# Patient Record
Sex: Male | Born: 1940
Health system: Southern US, Community
[De-identification: ages and names within clinical notes are randomized; demographics above are authoritative.]

## PROBLEM LIST (undated history)

## (undated) DIAGNOSIS — R339 Retention of urine, unspecified: Secondary | ICD-10-CM

## (undated) DIAGNOSIS — R197 Diarrhea, unspecified: Secondary | ICD-10-CM

## (undated) DIAGNOSIS — I872 Venous insufficiency (chronic) (peripheral): Secondary | ICD-10-CM

## (undated) DIAGNOSIS — I452 Bifascicular block: Secondary | ICD-10-CM

## (undated) DIAGNOSIS — R972 Elevated prostate specific antigen [PSA]: Secondary | ICD-10-CM

## (undated) DIAGNOSIS — K529 Noninfective gastroenteritis and colitis, unspecified: Secondary | ICD-10-CM

## (undated) DIAGNOSIS — Z8042 Family history of malignant neoplasm of prostate: Secondary | ICD-10-CM

## (undated) DIAGNOSIS — E340 Carcinoid syndrome: Secondary | ICD-10-CM

## (undated) DIAGNOSIS — Z87898 Personal history of other specified conditions: Secondary | ICD-10-CM

## (undated) DIAGNOSIS — H268 Other specified cataract: Secondary | ICD-10-CM

## (undated) DIAGNOSIS — Z960 Presence of urogenital implants: Secondary | ICD-10-CM

## (undated) DIAGNOSIS — Z809 Family history of malignant neoplasm, unspecified: Secondary | ICD-10-CM

## (undated) DIAGNOSIS — Z808 Family history of malignant neoplasm of other organs or systems: Secondary | ICD-10-CM

## (undated) DIAGNOSIS — N401 Enlarged prostate with lower urinary tract symptoms: Secondary | ICD-10-CM

## (undated) DIAGNOSIS — D509 Iron deficiency anemia, unspecified: Secondary | ICD-10-CM

## (undated) DIAGNOSIS — C7B8 Other secondary neuroendocrine tumors: Secondary | ICD-10-CM

## (undated) DIAGNOSIS — Z978 Presence of other specified devices: Secondary | ICD-10-CM

## (undated) DIAGNOSIS — N529 Male erectile dysfunction, unspecified: Secondary | ICD-10-CM

## (undated) DIAGNOSIS — I10 Essential (primary) hypertension: Secondary | ICD-10-CM

## (undated) DIAGNOSIS — N138 Other obstructive and reflux uropathy: Secondary | ICD-10-CM

## (undated) DIAGNOSIS — H409 Unspecified glaucoma: Secondary | ICD-10-CM

## (undated) DIAGNOSIS — Z86010 Personal history of colonic polyps: Secondary | ICD-10-CM

## (undated) HISTORY — DX: Family history of malignant neoplasm of prostate: Z80.42

## (undated) HISTORY — DX: Family history of malignant neoplasm of other organs or systems: Z80.8

## (undated) HISTORY — PX: QUADRICEPS TENDON REPAIR: SHX756

## (undated) HISTORY — PX: INGUINAL HERNIA REPAIR: SUR1180

## (undated) HISTORY — DX: Family history of malignant neoplasm, unspecified: Z80.9

## (undated) HISTORY — PX: TONSILLECTOMY: SUR1361

---

## 1997-12-27 ENCOUNTER — Ambulatory Visit (HOSPITAL_COMMUNITY): Admission: RE | Admit: 1997-12-27 | Discharge: 1997-12-27 | Payer: Self-pay | Admitting: General Surgery

## 1998-01-10 ENCOUNTER — Ambulatory Visit (HOSPITAL_COMMUNITY): Admission: RE | Admit: 1998-01-10 | Discharge: 1998-01-10 | Payer: Self-pay | Admitting: Family Medicine

## 1998-11-10 ENCOUNTER — Emergency Department (HOSPITAL_COMMUNITY): Admission: EM | Admit: 1998-11-10 | Discharge: 1998-11-10 | Payer: Self-pay | Admitting: *Deleted

## 1998-11-16 ENCOUNTER — Ambulatory Visit (HOSPITAL_COMMUNITY): Admission: RE | Admit: 1998-11-16 | Discharge: 1998-11-16 | Payer: Self-pay | Admitting: *Deleted

## 1999-08-30 ENCOUNTER — Other Ambulatory Visit: Admission: RE | Admit: 1999-08-30 | Discharge: 1999-08-30 | Payer: Self-pay | Admitting: Urology

## 2000-07-20 ENCOUNTER — Emergency Department (HOSPITAL_COMMUNITY): Admission: EM | Admit: 2000-07-20 | Discharge: 2000-07-20 | Payer: Self-pay | Admitting: Emergency Medicine

## 2000-08-12 ENCOUNTER — Encounter: Admission: RE | Admit: 2000-08-12 | Discharge: 2000-11-10 | Payer: Self-pay | Admitting: Family Medicine

## 2001-02-06 ENCOUNTER — Emergency Department (HOSPITAL_COMMUNITY): Admission: EM | Admit: 2001-02-06 | Discharge: 2001-02-06 | Payer: Self-pay | Admitting: *Deleted

## 2002-09-02 ENCOUNTER — Encounter: Admission: RE | Admit: 2002-09-02 | Discharge: 2002-09-02 | Payer: Self-pay | Admitting: Internal Medicine

## 2002-09-19 ENCOUNTER — Encounter: Admission: RE | Admit: 2002-09-19 | Discharge: 2002-09-19 | Payer: Self-pay | Admitting: Internal Medicine

## 2002-09-27 ENCOUNTER — Encounter: Admission: RE | Admit: 2002-09-27 | Discharge: 2002-09-27 | Payer: Self-pay | Admitting: Internal Medicine

## 2003-02-08 ENCOUNTER — Encounter: Admission: RE | Admit: 2003-02-08 | Discharge: 2003-02-08 | Payer: Self-pay | Admitting: Internal Medicine

## 2003-02-19 ENCOUNTER — Emergency Department (HOSPITAL_COMMUNITY): Admission: EM | Admit: 2003-02-19 | Discharge: 2003-02-19 | Payer: Self-pay | Admitting: Emergency Medicine

## 2003-02-19 ENCOUNTER — Encounter: Payer: Self-pay | Admitting: Emergency Medicine

## 2003-03-01 ENCOUNTER — Encounter: Admission: RE | Admit: 2003-03-01 | Discharge: 2003-03-01 | Payer: Self-pay | Admitting: Internal Medicine

## 2003-11-13 ENCOUNTER — Encounter: Admission: RE | Admit: 2003-11-13 | Discharge: 2003-11-13 | Payer: Self-pay | Admitting: Internal Medicine

## 2004-02-21 ENCOUNTER — Encounter: Admission: RE | Admit: 2004-02-21 | Discharge: 2004-02-21 | Payer: Self-pay | Admitting: Internal Medicine

## 2004-03-14 ENCOUNTER — Ambulatory Visit (HOSPITAL_COMMUNITY): Admission: RE | Admit: 2004-03-14 | Discharge: 2004-03-14 | Payer: Self-pay | Admitting: Gastroenterology

## 2004-03-14 ENCOUNTER — Encounter (INDEPENDENT_AMBULATORY_CARE_PROVIDER_SITE_OTHER): Payer: Self-pay | Admitting: Specialist

## 2004-05-12 ENCOUNTER — Ambulatory Visit (HOSPITAL_COMMUNITY): Admission: RE | Admit: 2004-05-12 | Discharge: 2004-05-12 | Payer: Self-pay | Admitting: Orthopedic Surgery

## 2004-09-04 ENCOUNTER — Ambulatory Visit: Payer: Self-pay | Admitting: Internal Medicine

## 2005-01-16 ENCOUNTER — Ambulatory Visit: Payer: Self-pay | Admitting: Internal Medicine

## 2005-11-13 ENCOUNTER — Ambulatory Visit: Payer: Self-pay | Admitting: Internal Medicine

## 2005-11-27 ENCOUNTER — Ambulatory Visit: Payer: Self-pay | Admitting: Internal Medicine

## 2006-11-23 ENCOUNTER — Telehealth: Payer: Self-pay | Admitting: *Deleted

## 2007-01-16 ENCOUNTER — Emergency Department (HOSPITAL_COMMUNITY): Admission: EM | Admit: 2007-01-16 | Discharge: 2007-01-16 | Payer: Self-pay | Admitting: Emergency Medicine

## 2007-01-20 ENCOUNTER — Encounter: Admission: RE | Admit: 2007-01-20 | Discharge: 2007-01-20 | Payer: Self-pay | Admitting: Family Medicine

## 2007-01-28 ENCOUNTER — Encounter: Admission: RE | Admit: 2007-01-28 | Discharge: 2007-01-28 | Payer: Self-pay | Admitting: Family Medicine

## 2007-05-01 ENCOUNTER — Emergency Department (HOSPITAL_COMMUNITY): Admission: EM | Admit: 2007-05-01 | Discharge: 2007-05-01 | Payer: Self-pay | Admitting: Emergency Medicine

## 2007-06-17 ENCOUNTER — Encounter: Admission: RE | Admit: 2007-06-17 | Discharge: 2007-06-17 | Payer: Self-pay | Admitting: Orthopedic Surgery

## 2010-02-14 ENCOUNTER — Emergency Department (HOSPITAL_COMMUNITY): Admission: EM | Admit: 2010-02-14 | Discharge: 2010-02-14 | Payer: Self-pay | Admitting: Family Medicine

## 2011-01-08 ENCOUNTER — Emergency Department (HOSPITAL_COMMUNITY): Payer: Medicare Other

## 2011-01-08 ENCOUNTER — Observation Stay (HOSPITAL_COMMUNITY)
Admission: EM | Admit: 2011-01-08 | Discharge: 2011-01-10 | Disposition: A | Discharge status: Home / Self Care | Payer: Medicare Other | Attending: Orthopedic Surgery | Admitting: Orthopedic Surgery

## 2011-01-08 DIAGNOSIS — N400 Enlarged prostate without lower urinary tract symptoms: Secondary | ICD-10-CM | POA: Insufficient documentation

## 2011-01-08 DIAGNOSIS — W010XXA Fall on same level from slipping, tripping and stumbling without subsequent striking against object, initial encounter: Secondary | ICD-10-CM | POA: Insufficient documentation

## 2011-01-08 DIAGNOSIS — Y9301 Activity, walking, marching and hiking: Secondary | ICD-10-CM | POA: Insufficient documentation

## 2011-01-08 DIAGNOSIS — S838X9A Sprain of other specified parts of unspecified knee, initial encounter: Principal | ICD-10-CM | POA: Insufficient documentation

## 2011-01-08 DIAGNOSIS — I10 Essential (primary) hypertension: Secondary | ICD-10-CM | POA: Insufficient documentation

## 2011-01-08 DIAGNOSIS — Z01812 Encounter for preprocedural laboratory examination: Secondary | ICD-10-CM | POA: Insufficient documentation

## 2011-01-08 DIAGNOSIS — Y998 Other external cause status: Secondary | ICD-10-CM | POA: Insufficient documentation

## 2011-01-09 LAB — BASIC METABOLIC PANEL
BUN: 10 mg/dL (ref 6–23)
CO2: 29 mEq/L (ref 19–32)
Chloride: 102 mEq/L (ref 96–112)
Creatinine, Ser: 1.02 mg/dL (ref 0.4–1.5)
Glucose, Bld: 161 mg/dL — ABNORMAL HIGH (ref 70–99)

## 2011-01-09 LAB — HEMOGLOBIN AND HEMATOCRIT, BLOOD: HCT: 36.6 % — ABNORMAL LOW (ref 39.0–52.0)

## 2011-01-11 LAB — POCT I-STAT 4, (NA,K, GLUC, HGB,HCT): Hemoglobin: 15.3 g/dL (ref 13.0–17.0)

## 2011-01-15 NOTE — Op Note (Signed)
NAME:  EVELIO, RUEDA NO.:  [id number]  MEDICAL RECORD NO.:  [id number]           PATIENT TYPE:  I  LOCATION:  5014                         FACILITY:  [facility]  PHYSICIAN:  Almedia Balls. Ranell Patrick, M.D. DATE OF BIRTH:  23-Jul-1941  DATE OF PROCEDURE:  01/08/2011 DATE OF DISCHARGE:                              OPERATIVE REPORT   PREOPERATIVE DIAGNOSIS:  Left quadriceps tendon tear.  POSTOPERATIVE DIAGNOSIS:  Left quadriceps tendon tear.  PROCEDURE PERFORMED:  Left quadriceps tendon repair.  SURGEON:  Almedia Balls. Ranell Patrick, MD  ASSISTANT:  Donnie Coffin. Dixon, PA  General anesthesia was used.  BLOOD LOSS:  Minimal.  FLUID REPLACEMENT:  Crystalloid 1200 mL.  INSTRUMENT COUNTS:  Correct.  There were no complications.  Tourniquet time was 45 minutes at 300 mmHg.  INDICATIONS:  The patient is a 70 year old male who suffered hyperflexion injuring to his left knee.  The patient was unable to ambulate after his injury.  The patient presented to [facility] Emergency Room where clinical diagnosis as well as MRI scan documenting complete quadriceps tendon tear on the left.  The patient has palpable defect, unable to extend his knee against gravity.  The patient presents now for operative treatment to restore continuity of his extensor mechanism to his left knee.  Informed consent was obtained.  DESCRIPTION OF PROCEDURE:  After an adequate level of anesthesia was achieved, the patient was positioned supine on the table.  Nonsterile tourniquet was placed over left proximal thigh.  Left leg sterilely prepped and draped in usual manner.  After exsanguination of the limb using Esmarch bandage, we elevated the tourniquet to 350 mmHg. Longitudinal midline skin incision was created using a 10-blade scalpel. Dissection down to the subcutaneous tissues using knife.  Bovie electrocautery used to control hemostasis.  I identified the quadriceps tendon which was completely torn.  We  debrided the tendon back to fresh ends, removing some devitalized tendinous tissue.  We also freshened up the proximal portion of the patella and used a rongeur to get down to bleeding bone.  We then placed three drill holes longitudinally through the bone and passing #2 FiberWire suture that we had whipstitched in a running Krackow fashion up into the quadriceps tendon for about 10 cm with locking sutures.  We also repaired the retinaculum medially and laterally using figure-of-eight #2 FiberWire suture x3 on both sides. Once we had our knee thoroughly irrigated.  We went ahead and tied our central sutures that were through the drill holes and tied them under the tendon distally so we would not incarcerate tendon.  We then tied our retinacular sutures.  We then oversewed the entire repair site with #1 Vicryl suture.  We had a nice solid repair that did not gap up to 40 degrees of flexion.  We thoroughly irrigated and closed with subcutaneous tissues of 2-0 Vicryl followed by staples for skin. Sterile compressive bandage with the knee immobilizer applied.  The patient tolerated the procedure well.     Almedia Balls. Ranell Patrick, M.D.     SRN/MEDQ  D:  01/09/2011  T:  01/09/2011  Job:  [id number]  Electronically  Signed by Malon Kindle  on 01/15/2011 05:19:49 PM

## 2011-01-31 NOTE — Discharge Summary (Signed)
  NAME:  Charles Schmidt, Charles Schmidt NO.:  [id number]  MEDICAL RECORD NO.:  [id number]           PATIENT TYPE:  O  LOCATION:  5014                         FACILITY:  [facility]  PHYSICIAN:  Almedia Balls. Ranell Patrick, M.D. DATE OF BIRTH:  08/26/1941  DATE OF ADMISSION:  01/08/2011 DATE OF DISCHARGE:  01/10/2011                              DISCHARGE SUMMARY   ADMITTING DIAGNOSIS:  Left complete quadriceps tendon tear, and that was performed on January 08, 2011.  CONSULTING SERVICES:  Physical Therapy, Occupational Therapy, Discharge Planning.  HISTORY OF PRESENT ILLNESS:  The patient is a 70 year old male who suffered a hyperflexion injury to his left knee.  The patient presented with an obvious quadriceps tendon tear verified by MRI scan.  The patient is admitted now for definitive operative treatment and recovery. Informed consent was obtained.  For further details on the patient's past medical history and physical examination, please see the hospital record.  HOSPITAL COURSE:  The patient was admitted to Orthopedics and taken to the operating room theater on January 08, 2011, where a left quadriceps tendon repair was performed.  The patient tolerated the procedure well, was taken postoperatively to the Orthopedic floor where he had physical therapy and occupational therapy.  The patient was mobilizing, weightbearing as tolerated in the knee immobilizer with crutches and walker prior to his discharge.  He was discharged in an improved condition to home with assistance of the house including physical therapy.  He will not be doing any range of motion work at home.  He is discharged on Robaxin and Percocet and a 325 of aspirin.  He is taking 1 tablet daily and using antiembolism stockings and mobilizing a lot to try to decrease his blood clot risk.  The patient will follow up in Orthopedics in week and a half to two weeks.     Almedia Balls. Ranell Patrick, M.D.     SRN/MEDQ  D:  01/15/2011   T:  01/16/2011  Job:  [id number]  Electronically Signed by Malon Kindle  on 01/31/2011 04:28:10 PM

## 2011-02-07 NOTE — Op Note (Signed)
NAME:  Charles Schmidt, Charles Schmidt                        ACCOUNT NO.:  [id number]   MEDICAL RECORD NO.:  [id number]                   PATIENT TYPE:  AMB   LOCATION:  ENDO                                 FACILITY:  [facility]   PHYSICIAN:  Graylin Shiver, M.D.                DATE OF BIRTH:  Mar 05, 1941   DATE OF PROCEDURE:  03/14/2004  DATE OF DISCHARGE:                                 OPERATIVE REPORT   PROCEDURE:  Colonoscopy with biopsy.   INDICATIONS FOR PROCEDURE:  History of colon polyps.  Informed consent was  obtained after explanation of the risks of bleeding, infection, and  perforation.   PREMEDICATION:  Fentanyl 60 mcg IV, Versed 7.5 mg IV.   PROCEDURE IN DETAIL:  With the patient in the left lateral decubitus  position, a rectal exam was performed and no masses were felt.  The Olympus  colonoscope was inserted into the rectum and advanced around the colon to  the cecum.  Cecal landmarks were identified.  The cecum was normal.  In the  ascending colon, there was a small 3 mm polyp biopsied off with cold  forceps.  In the transverse colon, there was a long fold which I suspect  could be a stalk from a prior polypectomy.  A biopsy of this was obtained.  It did not look like a polyp, but it could be a fold that is elongated or a  stalk from a prior polypectomy.  The descending colon, sigmoid, and rectum  looked normal.  He tolerated the procedure well without complications.   IMPRESSION:  Small polyps in the ascending colon and a probable retained  stalk from prior polypectomy.   PLAN:  The pathology will be checked.                                               Graylin Shiver, M.D.    Germain Osgood  D:  03/14/2004  T:  03/15/2004  Job:  [id number]   cc:   Blanch Media, M.D.  [facility]  [place], [place] [address]  Fax: [phone]

## 2012-05-16 ENCOUNTER — Encounter (HOSPITAL_COMMUNITY): Payer: Self-pay | Admitting: *Deleted

## 2012-05-16 ENCOUNTER — Emergency Department (HOSPITAL_COMMUNITY)
Admission: EM | Admit: 2012-05-16 | Discharge: 2012-05-16 | Disposition: A | Discharge status: Home / Self Care | Payer: Medicare Other | Attending: Emergency Medicine | Admitting: Emergency Medicine

## 2012-05-16 DIAGNOSIS — R21 Rash and other nonspecific skin eruption: Secondary | ICD-10-CM

## 2012-05-16 DIAGNOSIS — I10 Essential (primary) hypertension: Secondary | ICD-10-CM | POA: Insufficient documentation

## 2012-05-16 HISTORY — DX: Essential (primary) hypertension: I10

## 2012-05-16 MED ORDER — PERMETHRIN 5 % EX CREA
TOPICAL_CREAM | Freq: Once | CUTANEOUS | Status: AC
  Administered 2012-05-16: 10:00:00 via TOPICAL
  Filled 2012-05-16: qty 60

## 2012-05-16 NOTE — ED Notes (Signed)
Pt states a week ago he was out in the woods and got a rash on his inner thigh. Pt states that the rash is now on his right hand for the past week. Pt states tried calamine lotion with no relief.

## 2012-05-16 NOTE — ED Provider Notes (Signed)
History     CSN: [id number]  Arrival date & time 05/16/12  0700   First MD Initiated Contact with Patient 05/16/12 [phone]      Chief Complaint  Patient presents with  . Rash    (Consider location/radiation/quality/duration/timing/severity/associated sxs/prior treatment) Patient is a 71 y.o. male presenting with rash. The history is provided by the patient.  Rash  This is a new problem. The current episode started more than 2 days ago. The problem has been gradually worsening. Associated symptoms comments: Rash to right hand and distal forearm for one week. It starts as small fluid filled pustules that contain clear liquid then spread to a non-pustular rash. It itches, no pain. No swelling or fever. No one else at home affected. .    Past Medical History  Diagnosis Date  . Hypertension     History reviewed. No pertinent past surgical history.  History reviewed. No pertinent family history.  History  Substance Use Topics  . Smoking status: Never Smoker   . Smokeless tobacco: Not on file  . Alcohol Use: No      Review of Systems  Constitutional: Negative for fever.  Respiratory: Negative for cough.   Gastrointestinal: Negative for nausea.  Musculoskeletal: Negative for arthralgias.  Skin: Positive for rash.    Allergies  Review of patient's allergies indicates no known allergies.  Home Medications   Current Outpatient Rx  Name Route Sig Dispense Refill  . AMLODIPINE BESYLATE 5 MG PO TABS Oral Take 5 mg by mouth daily.    . DORZOLAMIDE HCL-TIMOLOL MAL 22.3-6.8 MG/ML OP SOLN Left Eye Place 1 drop into the left eye 2 (two) times daily.    Marland Kitchen DOXAZOSIN MESYLATE 4 MG PO TABS Oral Take 4 mg by mouth at bedtime.    Marland Kitchen FINASTERIDE 5 MG PO TABS Oral Take 5 mg by mouth daily.    Marland Kitchen HYDROCHLOROTHIAZIDE 25 MG PO TABS Oral Take 25 mg by mouth daily.    . NYQUIL PO Oral Take 1 capsule by mouth once. itching    . TRAVOPROST (BAK FREE) 0.004 % OP SOLN Left Eye Place 1 drop into the  left eye at bedtime.      BP 138/79  Pulse 76  Temp 97.6 F (36.4 C) (Oral)  Resp 16  SpO2 99%  Physical Exam  Constitutional: He is oriented to person, place, and time. He appears well-developed and well-nourished.  Neck: Normal range of motion.  Pulmonary/Chest: Effort normal.  Musculoskeletal: Normal range of motion.  Neurological: He is alert and oriented to person, place, and time.  Skin: Skin is warm and dry.       Maculopapular rash dorsum right hand and at base of fingers. One small blister with clear fluid, non-tender. Web spaces of finger intermittently affected. No volar rash.   Psychiatric: He has a normal mood and affect.    ED Course  Procedures (including critical care time)  Labs Reviewed - No data to display No results found.   No diagnosis found.  1. Rash   MDM  DDx: scabies vs. dyshydrotic eczema - will treat for both and recommend derm follow up.        Rodena Medin, PA-C 05/16/12 0900

## 2012-05-20 NOTE — ED Provider Notes (Signed)
Medical screening examination/treatment/procedure(s) were performed by non-physician practitioner and as supervising physician I was immediately available for consultation/collaboration.  Hurman Horn, MD 05/20/12 1150

## 2014-05-02 ENCOUNTER — Other Ambulatory Visit: Payer: Self-pay | Admitting: Gastroenterology

## 2014-08-22 ENCOUNTER — Other Ambulatory Visit: Payer: Self-pay | Admitting: Family Medicine

## 2014-08-22 ENCOUNTER — Ambulatory Visit
Admission: RE | Admit: 2014-08-22 | Discharge: 2014-08-22 | Disposition: A | Discharge status: Home / Self Care | Payer: Medicare Other | Source: Ambulatory Visit | Attending: Family Medicine | Admitting: Family Medicine

## 2014-08-22 DIAGNOSIS — J00 Acute nasopharyngitis [common cold]: Secondary | ICD-10-CM

## 2014-08-24 ENCOUNTER — Encounter (HOSPITAL_COMMUNITY): Payer: Self-pay | Admitting: Emergency Medicine

## 2014-08-24 ENCOUNTER — Emergency Department (HOSPITAL_COMMUNITY)
Admission: EM | Admit: 2014-08-24 | Discharge: 2014-08-24 | Disposition: A | Discharge status: Home / Self Care | Payer: Medicare Other | Attending: Emergency Medicine | Admitting: Emergency Medicine

## 2014-08-24 DIAGNOSIS — R339 Retention of urine, unspecified: Secondary | ICD-10-CM | POA: Insufficient documentation

## 2014-08-24 DIAGNOSIS — I10 Essential (primary) hypertension: Secondary | ICD-10-CM | POA: Insufficient documentation

## 2014-08-24 DIAGNOSIS — Z79899 Other long term (current) drug therapy: Secondary | ICD-10-CM | POA: Diagnosis not present

## 2014-08-24 LAB — COMPREHENSIVE METABOLIC PANEL
ALBUMIN: 3.3 g/dL — AB (ref 3.5–5.2)
ALT: 32 U/L (ref 0–53)
AST: 23 U/L (ref 0–37)
Alkaline Phosphatase: 105 U/L (ref 39–117)
Anion gap: 13 (ref 5–15)
BUN: 10 mg/dL (ref 6–23)
CALCIUM: 9.1 mg/dL (ref 8.4–10.5)
CO2: 26 mEq/L (ref 19–32)
Chloride: 98 mEq/L (ref 96–112)
Creatinine, Ser: 0.9 mg/dL (ref 0.50–1.35)
GFR calc non Af Amer: 82 mL/min — ABNORMAL LOW (ref >90)
GLUCOSE: 104 mg/dL — AB (ref 70–99)
Potassium: 3.8 mEq/L (ref 3.7–5.3)
SODIUM: 137 meq/L (ref 137–147)
TOTAL PROTEIN: 7.7 g/dL (ref 6.0–8.3)
Total Bilirubin: 0.2 mg/dL — ABNORMAL LOW (ref 0.3–1.2)

## 2014-08-24 LAB — CBC WITH DIFFERENTIAL/PLATELET
BASOS ABS: 0 10*3/uL (ref 0.0–0.1)
BASOS PCT: 0 % (ref 0–1)
EOS ABS: 0.1 10*3/uL (ref 0.0–0.7)
EOS PCT: 1 % (ref 0–5)
HCT: 34.9 % — ABNORMAL LOW (ref 39.0–52.0)
Hemoglobin: 11.6 g/dL — ABNORMAL LOW (ref 13.0–17.0)
LYMPHS ABS: 1.5 10*3/uL (ref 0.7–4.0)
Lymphocytes Relative: 23 % (ref 12–46)
MCH: 24 pg — AB (ref 26.0–34.0)
MCHC: 33.2 g/dL (ref 30.0–36.0)
MCV: 72.3 fL — AB (ref 78.0–100.0)
Monocytes Absolute: 0.4 10*3/uL (ref 0.1–1.0)
Monocytes Relative: 7 % (ref 3–12)
NEUTROS PCT: 68 % (ref 43–77)
Neutro Abs: 4.3 10*3/uL (ref 1.7–7.7)
PLATELETS: 191 10*3/uL (ref 150–400)
RBC: 4.83 MIL/uL (ref 4.22–5.81)
RDW: 15.4 % (ref 11.5–15.5)
WBC: 6.3 10*3/uL (ref 4.0–10.5)

## 2014-08-24 LAB — URINALYSIS, ROUTINE W REFLEX MICROSCOPIC
Bilirubin Urine: NEGATIVE
Glucose, UA: NEGATIVE mg/dL
HGB URINE DIPSTICK: NEGATIVE
Ketones, ur: NEGATIVE mg/dL
Leukocytes, UA: NEGATIVE
NITRITE: NEGATIVE
PH: 6.5 (ref 5.0–8.0)
Protein, ur: NEGATIVE mg/dL
SPECIFIC GRAVITY, URINE: 1.012 (ref 1.005–1.030)
UROBILINOGEN UA: 0.2 mg/dL (ref 0.0–1.0)

## 2014-08-24 NOTE — ED Notes (Signed)
Pt here with urinary retention starting today; pt sts was dribbling until noon and having some possible incontinence; pt denies pain but sts feels full

## 2014-08-24 NOTE — Discharge Instructions (Signed)
Acute Urinary Retention Acute urinary retention is when you are unable to pee (urinate). Acute urinary retention is common in older men. Prostates can get bigger, which blocks the flow of pee.  HOME CARE  Drink enough fluids to keep your pee clear or pale yellow.  If you are sent home with a tube that drains the bladder (catheter), there will be a drainage bag attached to it. There are two types of bags. One is big that you can wear at night without having to empty it. One is smaller and needs to be emptied more often.  Keep the drainage bag empty.  Keep the drainage bag lower than your catheter.  Only take medicine as told by your doctor. GET HELP IF: 1. You have a low-grade fever. 2. You have spasms or you are leaking pee when you have spasms. GET HELP RIGHT AWAY IF:  1. You have chills or a fever. 2. Your catheter stops draining pee. 3. Your catheter falls out. 4. You have increased bleeding that does not stop after you have rested and increased the amount of fluids you had been drinking. MAKE SURE YOU:  1. Understand these instructions. 2. Will watch your condition. 3. Will get help right away if you are not doing well or get worse. Document Released: 02/25/2008 Document Revised: 06/29/2013 Document Reviewed: 02/17/2013 [facility] Patient Information 2015 [place], [place]. This information is not intended to replace advice given to you by your health care provider. Make sure you discuss any questions you have with your health care provider. Foley Catheter Care A Foley catheter is a soft, flexible tube that is placed into the bladder to drain urine. A Foley catheter may be inserted if:  You leak urine or are not able to control when you urinate (urinary incontinence).  You are not able to urinate when you need to (urinary retention).  You had prostate surgery or surgery on the genitals.  You have certain medical conditions, such as multiple sclerosis, dementia, or a spinal cord  injury. If you are going home with a Foley catheter in place, follow the instructions below. TAKING CARE OF THE CATHETER 3. Wash your hands with soap and water. 4. Using mild soap and warm water on a clean washcloth:  Clean the area on your body closest to the catheter insertion site using a circular motion, moving away from the catheter. Never wipe toward the catheter because this could sweep bacteria up into the urethra and cause infection.  Remove all traces of soap. Pat the area dry with a clean towel. For males, reposition the foreskin. 5. Attach the catheter to your leg so there is no tension on the catheter. Use adhesive tape or a leg strap. If you are using adhesive tape, remove any sticky residue left behind by the previous tape you used. 6. Keep the drainage bag below the level of the bladder, but keep it off the floor. 7. Check throughout the day to be sure the catheter is working and urine is draining freely. Make sure the tubing does not become kinked. 8. Do not pull on the catheter or try to remove it. Pulling could damage internal tissues. TAKING CARE OF THE DRAINAGE BAGS You will be given two drainage bags to take home. One is a large overnight drainage bag, and the other is a smaller leg bag that fits underneath clothing. You may wear the overnight bag at any time, but you should never wear the smaller leg bag at night. Follow the  instructions below for how to empty, change, and clean your drainage bags. Emptying the Drainage Bag You must empty your drainage bag when it is  - full or at least 2-3 times a day. 5. Wash your hands with soap and water. 6. Keep the drainage bag below your hips, below the level of your bladder. This stops urine from going back into the tubing and into your bladder. 7. Hold the dirty bag over the toilet or a clean container. 8. Open the pour spout at the bottom of the bag and empty the urine into the toilet or container. Do not let the pour spout touch  the toilet, container, or any other surface. Doing so can place bacteria on the bag, which can cause an infection. 9. Clean the pour spout with a gauze pad or cotton ball that has rubbing alcohol on it. 10. Close the pour spout. 11. Attach the bag to your leg with adhesive tape or a leg strap. 12. Wash your hands well. Changing the Drainage Bag Change your drainage bag once a month or sooner if it starts to smell bad or look dirty. Below are steps to follow when changing the drainage bag. 4. Wash your hands with soap and water. 5. Pinch off the rubber catheter so that urine does not spill out. 6. Disconnect the catheter tube from the drainage tube at the connection valve. Do not let the tubes touch any surface. 7. Clean the end of the catheter tube with an alcohol wipe. Use a different alcohol wipe to clean the end of the drainage tube. 8. Connect the catheter tube to the drainage tube of the clean drainage bag. 9. Attach the new bag to the leg with adhesive tape or a leg strap. Avoid attaching the new bag too tightly. 10. Wash your hands well. Cleaning the Drainage Bag 1. Wash your hands with soap and water. 2. Wash the bag in warm, soapy water. 3. Rinse the bag thoroughly with warm water. 4. Fill the bag with a solution of white vinegar and water (1 cup vinegar to 1 qt warm water [.2 L vinegar to 1 L warm water]). Close the bag and soak it for 30 minutes in the solution. 5. Rinse the bag with warm water. 6. Hang the bag to dry with the pour spout open and hanging downward. 7. Store the clean bag (once it is dry) in a clean plastic bag. 8. Wash your hands well. PREVENTING INFECTION  Wash your hands before and after handling your catheter.  Take showers daily and wash the area where the catheter enters your body. Do not take baths. Replace wet leg straps with dry ones, if this applies.  Do not use powders, sprays, or lotions on the genital area. Only use creams, lotions, or ointments as  directed by your caregiver.  For females, wipe from front to back after each bowel movement.  Drink enough fluids to keep your urine clear or pale yellow unless you have a fluid restriction.  Do not let the drainage bag or tubing touch or lie on the floor.  Wear cotton underwear to absorb moisture and to keep your skin drier. SEEK MEDICAL CARE IF:   Your urine is cloudy or smells unusually bad.  Your catheter becomes clogged.  You are not draining urine into the bag or your bladder feels full.  Your catheter starts to leak. SEEK IMMEDIATE MEDICAL CARE IF:   You have pain, swelling, redness, or pus where the catheter enters the  body.  You have pain in the abdomen, legs, lower back, or bladder.  You have a fever.  You see blood fill the catheter, or your urine is pink or red.  You have nausea, vomiting, or chills.  Your catheter gets pulled out. MAKE SURE YOU:   Understand these instructions.  Will watch your condition.  Will get help right away if you are not doing well or get worse. Document Released: 09/08/2005 Document Revised: 01/23/2014 Document Reviewed: 08/30/2012 [facility] Patient Information 2015 [place], [place]. This information is not intended to replace advice given to you by your health care provider. Make sure you discuss any questions you have with your health care provider.

## 2014-08-24 NOTE — ED Notes (Signed)
Leg bag foley placed for discharge.

## 2014-08-24 NOTE — ED Provider Notes (Signed)
CSN: [id number]     Arrival date & time 08/24/14  1623 History   First MD Initiated Contact with Patient 08/24/14 1750     Chief Complaint  Patient presents with  . Urinary Retention     (Consider location/radiation/quality/duration/timing/severity/associated sxs/prior Treatment) Patient is a 73 y.o. male presenting with abdominal pain. The history is provided by the patient. No language interpreter was used.  Abdominal Pain Pain location:  Suprapubic Associated symptoms: no chills and no fever   Associated symptoms comment:  He reports progressive suprapubic pain and distention throughout the day. Last able to urinate at noon (7 hours ago). He denies history of urinary retention in the past but has a known history of prostatic hypertrophy, followed by urology. No fever, N, V or earlier hematuria.   Past Medical History  Diagnosis Date  . Hypertension    History reviewed. No pertinent past surgical history. History reviewed. No pertinent family history. History  Substance Use Topics  . Smoking status: Never Smoker   . Smokeless tobacco: Not on file  . Alcohol Use: No    Review of Systems  Constitutional: Negative for fever and chills.  HENT: Negative.   Respiratory: Negative.   Cardiovascular: Negative.   Gastrointestinal: Positive for abdominal pain.  Genitourinary: Positive for difficulty urinating. Negative for frequency, flank pain, scrotal swelling and testicular pain.  Musculoskeletal: Negative.   Skin: Negative.   Neurological: Negative.       Allergies  Review of patient's allergies indicates no known allergies.  Home Medications   Prior to Admission medications   Medication Sig Start Date End Date Taking? Authorizing Provider  amLODipine (NORVASC) 5 MG tablet Take 5 mg by mouth daily.   Yes Historical Provider, MD  dorzolamide-timolol (COSOPT) 22.3-6.8 MG/ML ophthalmic solution Place 1 drop into the left eye 2 (two) times daily.   Yes Historical Provider,  MD  doxazosin (CARDURA) 4 MG tablet Take 4 mg by mouth at bedtime.   Yes Historical Provider, MD  finasteride (PROSCAR) 5 MG tablet Take 5 mg by mouth daily.   Yes Historical Provider, MD  hydrochlorothiazide (HYDRODIURIL) 25 MG tablet Take 25 mg by mouth daily.   Yes Historical Provider, MD  levofloxacin (LEVAQUIN) 500 MG tablet Take 500 mg by mouth daily. 08/22/14  Yes Historical Provider, MD  potassium chloride SA (K-DUR,KLOR-CON) 20 MEQ tablet Take 20 mEq by mouth daily. 08/14/14  Yes Historical Provider, MD  Travoprost, BAK Free, (TRAVATAN) 0.004 % SOLN ophthalmic solution Place 1 drop into the left eye at bedtime.   Yes Historical Provider, MD   BP 130/69 mmHg  Pulse 72  Temp(Src) 97.4 F (36.3 C) (Oral)  Resp 24  SpO2 100% Physical Exam  Constitutional: He is oriented to person, place, and time. He appears well-developed and well-nourished. No distress.  Pulmonary/Chest: Effort normal.  Abdominal: Soft. He exhibits distension. He exhibits no mass. There is tenderness.  Neurological: He is alert and oriented to person, place, and time.  Skin: Skin is warm and dry.    ED Course  Procedures (including critical care time) Labs Review Labs Reviewed  CBC WITH DIFFERENTIAL - Abnormal; Notable for the following:    Hemoglobin 11.6 (*)    HCT 34.9 (*)    MCV 72.3 (*)    MCH 24.0 (*)    All other components within normal limits  COMPREHENSIVE METABOLIC PANEL - Abnormal; Notable for the following:    Glucose, Bld 104 (*)    Albumin 3.3 (*)  Total Bilirubin 0.2 (*)    GFR calc non Af Amer 82 (*)    All other components within normal limits  URINALYSIS, ROUTINE W REFLEX MICROSCOPIC - Abnormal; Notable for the following:    APPearance HAZY (*)    All other components within normal limits    Imaging Review No results found.   EKG Interpretation None      MDM   Final diagnoses:  None    1. Urinary retention  Foley catheter placed without difficulty. Patient is  feeling much better and is stable for discharge. No evidence infection, renal impairment. Suspect retention secondary to prostate enlargement. He has urology follow up.     Dewaine Oats, PA-C 08/24/14 [place], DO 08/24/14 [phone]

## 2014-10-22 ENCOUNTER — Encounter (HOSPITAL_COMMUNITY): Payer: Self-pay | Admitting: *Deleted

## 2014-10-22 ENCOUNTER — Emergency Department (HOSPITAL_COMMUNITY)
Admission: EM | Admit: 2014-10-22 | Discharge: 2014-10-22 | Disposition: A | Discharge status: Home / Self Care | Payer: Medicare Other | Attending: Emergency Medicine | Admitting: Emergency Medicine

## 2014-10-22 DIAGNOSIS — I10 Essential (primary) hypertension: Secondary | ICD-10-CM | POA: Diagnosis not present

## 2014-10-22 DIAGNOSIS — R339 Retention of urine, unspecified: Secondary | ICD-10-CM | POA: Diagnosis present

## 2014-10-22 DIAGNOSIS — Z79899 Other long term (current) drug therapy: Secondary | ICD-10-CM | POA: Insufficient documentation

## 2014-10-22 LAB — URINALYSIS, ROUTINE W REFLEX MICROSCOPIC
BILIRUBIN URINE: NEGATIVE
GLUCOSE, UA: NEGATIVE mg/dL
Hgb urine dipstick: NEGATIVE
KETONES UR: NEGATIVE mg/dL
LEUKOCYTES UA: NEGATIVE
Nitrite: NEGATIVE
PROTEIN: NEGATIVE mg/dL
Specific Gravity, Urine: 1.022 (ref 1.005–1.030)
UROBILINOGEN UA: 0.2 mg/dL (ref 0.0–1.0)
pH: 6.5 (ref 5.0–8.0)

## 2014-10-22 MED ORDER — TAMSULOSIN HCL 0.4 MG PO CAPS
0.4000 mg | ORAL_CAPSULE | ORAL | Status: AC
  Administered 2014-10-22: 0.4 mg via ORAL
  Filled 2014-10-22: qty 1

## 2014-10-22 MED ORDER — TAMSULOSIN HCL 0.4 MG PO CAPS: 0.4000 mg | ORAL_CAPSULE | Freq: Every day | ORAL | Status: DC

## 2014-10-22 NOTE — ED Notes (Signed)
In and out cath performed by ED Tech.  335cc's noted out.  Specimen obtained and sent to the lab.  Tolerated well.

## 2014-10-22 NOTE — ED Notes (Signed)
357cc's noted per bladder scanner.

## 2014-10-22 NOTE — ED Provider Notes (Signed)
CSN: [id number]     Arrival date & time 10/22/14  1911 History   First MD Initiated Contact with Patient 10/22/14 2018     Chief Complaint  Patient presents with  . Urinary Retention      The history is provided by the patient.  Patient presents with difficulty urinating.  This has been present all day.  It is unchanged.  He reports producing small amounts of urine.  However he feels he still has urine in his bladder.  Rest improves his symptoms.  He denies fever/vomiting.  No back pain.  No weakness is reported.  He has had this previously and has had a foley catheter placed.  He reports he is supposed to take rapaflo but would prefer to take flomax.    Past Medical History  Diagnosis Date  . Hypertension    History reviewed. No pertinent past surgical history. History reviewed. No pertinent family history. History  Substance Use Topics  . Smoking status: Never Smoker   . Smokeless tobacco: Not on file  . Alcohol Use: No    Review of Systems  Constitutional: Negative for fever.  Gastrointestinal: Negative for vomiting and abdominal pain.  Genitourinary: Positive for difficulty urinating.  Musculoskeletal: Negative for back pain.  Neurological: Negative for weakness.  All other systems reviewed and are negative.     Allergies  Review of patient's allergies indicates no known allergies.  Home Medications   Prior to Admission medications   Medication Sig Start Date End Date Taking? Authorizing Provider  amLODipine (NORVASC) 5 MG tablet Take 5 mg by mouth daily.    Historical Provider, MD  dorzolamide-timolol (COSOPT) 22.3-6.8 MG/ML ophthalmic solution Place 1 drop into the left eye 2 (two) times daily.    Historical Provider, MD  doxazosin (CARDURA) 4 MG tablet Take 4 mg by mouth at bedtime.    Historical Provider, MD  finasteride (PROSCAR) 5 MG tablet Take 5 mg by mouth daily.    Historical Provider, MD  hydrochlorothiazide (HYDRODIURIL) 25 MG tablet Take 25 mg by mouth  daily.    Historical Provider, MD  potassium chloride SA (K-DUR,KLOR-CON) 20 MEQ tablet Take 20 mEq by mouth daily. 08/14/14   Historical Provider, MD  Travoprost, BAK Free, (TRAVATAN) 0.004 % SOLN ophthalmic solution Place 1 drop into the left eye at bedtime.    Historical Provider, MD   BP 122/83 mmHg  Pulse 88  Temp(Src) 97.7 F (36.5 C) (Oral)  Resp 18  Ht 6\' 4"  (1.93 m)  Wt 252 lb (114.306 kg)  BMI 30.69 kg/m2  SpO2 99% Physical Exam CONSTITUTIONAL: Well developed/well nourished HEAD: Normocephalic/atraumatic EYES: EOMI/PERRL ENMT: Mucous membranes moist NECK: supple no meningeal signs SPINE/BACK:entire spine nontender CV: S1/S2 noted, no murmurs/rubs/gallops noted LUNGS: Lungs are clear to auscultation bilaterally, no apparent distress ABDOMEN: soft, nontender, no rebound or guarding, bowel sounds noted throughout abdomen GU:no cva tenderness NEURO: Pt is awake/alert/appropriate, moves all extremitiesx4.  No facial droop.   EXTREMITIES: pulses normal/equal, full ROM SKIN: warm, color normal PSYCH: no abnormalities of mood noted, alert and oriented to situation  ED Course  Procedures  9:21 PM Approximately 326ml urine in bladder.  Pt admits to passing some urine at home.  After further discussion he would prefer to have in/out catheter to remove urine and he would like dose of flomax.  Will not leave foley in place at this time.   9:53 PM Pt requests flomax at discharge He reports he is able to pass urine at home -  will defer foley catheter for now He is appropriate for d/c home  Labs Review Labs Reviewed  URINE CULTURE  URINALYSIS, ROUTINE W REFLEX MICROSCOPIC   Medications  tamsulosin (FLOMAX) capsule 0.4 mg (0.4 mg Oral Given 10/22/14 2104)      MDM   Final diagnoses:  Urinary retention    Nursing notes including past medical history and social history reviewed and considered in documentation Labs/vital reviewed myself and considered during  evaluation Previous records reviewed and considered     Sharyon Cable, MD 10/22/14 2153

## 2014-10-22 NOTE — Discharge Instructions (Signed)
PLEASE RETURN FOR ANY FEVER, VOMITNG, SEVERE ABDOMINAL PAIN OR IF YOU ARE UNABLE TO PASS ANY URINE

## 2014-10-22 NOTE — ED Notes (Signed)
Pt reports difficulty urinating. Pt called PCP, pt was told by PCP to come to ED to have catheter placed. Pt states he has the urge to urinate but only drops of urine come out. Pt reports urinating only a cup today.

## 2014-10-24 LAB — URINE CULTURE
Colony Count: NO GROWTH
Culture: NO GROWTH

## 2014-12-09 ENCOUNTER — Emergency Department (HOSPITAL_COMMUNITY)
Admission: EM | Admit: 2014-12-09 | Discharge: 2014-12-09 | Disposition: A | Discharge status: Home / Self Care | Payer: Medicare Other | Attending: Emergency Medicine | Admitting: Emergency Medicine

## 2014-12-09 ENCOUNTER — Encounter (HOSPITAL_COMMUNITY): Payer: Self-pay | Admitting: Emergency Medicine

## 2014-12-09 DIAGNOSIS — I8311 Varicose veins of right lower extremity with inflammation: Secondary | ICD-10-CM

## 2014-12-09 DIAGNOSIS — Z9981 Dependence on supplemental oxygen: Secondary | ICD-10-CM | POA: Diagnosis not present

## 2014-12-09 DIAGNOSIS — I872 Venous insufficiency (chronic) (peripheral): Secondary | ICD-10-CM | POA: Diagnosis not present

## 2014-12-09 DIAGNOSIS — Z79899 Other long term (current) drug therapy: Secondary | ICD-10-CM | POA: Diagnosis not present

## 2014-12-09 DIAGNOSIS — I10 Essential (primary) hypertension: Secondary | ICD-10-CM | POA: Diagnosis not present

## 2014-12-09 DIAGNOSIS — M7989 Other specified soft tissue disorders: Secondary | ICD-10-CM | POA: Diagnosis present

## 2014-12-09 NOTE — ED Notes (Signed)
Pt. Stated, I have this stuff on my leg  Were it itches ad then my socks will leave a ring around my leg.  I think my leg is going to rot

## 2014-12-09 NOTE — Discharge Instructions (Signed)

## 2014-12-09 NOTE — ED Notes (Signed)
EDP at the bedside.  ?

## 2014-12-09 NOTE — ED Provider Notes (Signed)
CSN: [id number]     Arrival date & time 12/09/14  1007 History   First MD Initiated Contact with Patient 12/09/14 1035     Chief Complaint  Patient presents with  . Leg Swelling  . Pruritis     (Consider location/radiation/quality/duration/timing/severity/associated sxs/prior Treatment) HPI Patient started to notice a discoloration of his leg about a year ago. He reports he noticed after wearing an ankle sock that he would have a ring around his leg. He felt that might be causing the problem. He then transitioned and wearing no socks. He is concerned about this brownish discoloration although it remains predominantly symptomatically. Past Medical History  Diagnosis Date  . Hypertension    History reviewed. No pertinent past surgical history. No family history on file. History  Substance Use Topics  . Smoking status: Never Smoker   . Smokeless tobacco: Not on file  . Alcohol Use: No    Review of Systems Constitutional: No fevers no chills Respiratory: No shortness of breath or chest pain.   Allergies  Review of patient's allergies indicates no known allergies.  Home Medications   Prior to Admission medications   Medication Sig Start Date End Date Taking? Authorizing Provider  amLODipine (NORVASC) 5 MG tablet Take 5 mg by mouth daily.    Historical Provider, MD  dorzolamide-timolol (COSOPT) 22.3-6.8 MG/ML ophthalmic solution Place 1 drop into the left eye 2 (two) times daily.    Historical Provider, MD  finasteride (PROSCAR) 5 MG tablet Take 5 mg by mouth daily.    Historical Provider, MD  hydrochlorothiazide (HYDRODIURIL) 25 MG tablet Take 25 mg by mouth daily.    Historical Provider, MD  potassium chloride SA (K-DUR,KLOR-CON) 20 MEQ tablet Take 20 mEq by mouth daily. 08/14/14   Historical Provider, MD  tamsulosin (FLOMAX) 0.4 MG CAPS capsule Take 1 capsule (0.4 mg total) by mouth daily after supper. 10/22/14   [name], MD  Travoprost, BAK Free, (TRAVATAN) 0.004 %  SOLN ophthalmic solution Place 1 drop into the left eye at bedtime.    Historical Provider, MD   BP 118/74 mmHg  Pulse 48  Temp(Src) 97.9 F (36.6 C) (Oral)  Resp 18  Ht 6' 4.5" (1.943 m)  Wt 250 lb (113.399 kg)  BMI 30.04 kg/m2  SpO2 97% Physical Exam  Constitutional: He is oriented to person, place, and time. He appears well-developed and well-nourished. No distress.  HENT:  Head: Normocephalic and atraumatic.  Eyes: EOM are normal.  Cardiovascular: Normal rate, regular rhythm, normal heart sounds and intact distal pulses.   Pulmonary/Chest: Effort normal and breath sounds normal. No respiratory distress. He has no wheezes. He has no rales.  Abdominal: Soft. He exhibits no distension. There is no tenderness.  Musculoskeletal:  The patient has a irregular patch of hyperpigmentation on the anterior tibial surface of the right lower extremity. This is approximately 12 x 6 cm. Overlying skin is normal, no erythema. Bilateral lower extremities have trace pitting edema nontender. There is skin thinning and hair loss.  Neurological: He is alert and oriented to person, place, and time. Coordination normal.  Skin: Skin is warm and dry.  Psychiatric: He has a normal mood and affect.    ED Course  Procedures (including critical care time) Labs Review Labs Reviewed - No data to display  Imaging Review No results found.   EKG Interpretation None      MDM   Final diagnoses:  Chronic venous stasis dermatitis, right   Findings are consistent with hyperpigmentation  seen and chronic venous stasis. Patient's historical description of swelling and seeing the sock line when he removes his oxygen evening is consistent with venous stasis. At this time there is no evidence of DVT or other acute complicating factors. The skin changes have been developing over a year that the patient has noticed. Counseled on the nature of venous stasis and to discuss ongoing management with his physician  including elevation and compression hose.    Charlesetta Shanks, MD 12/10/14 [phone]

## 2016-06-24 ENCOUNTER — Encounter (HOSPITAL_COMMUNITY): Payer: Self-pay | Admitting: Emergency Medicine

## 2016-06-24 DIAGNOSIS — R21 Rash and other nonspecific skin eruption: Secondary | ICD-10-CM | POA: Insufficient documentation

## 2016-06-24 DIAGNOSIS — I10 Essential (primary) hypertension: Secondary | ICD-10-CM | POA: Diagnosis not present

## 2016-06-24 NOTE — ED Triage Notes (Signed)
Pt states "i've got this thing on my leg and i've tried everything, i've had it for a year". Pt has what appears to be rash on his leg. Pt states 'I tried grease, keeping warm, exercising, leaving my socks off".

## 2016-06-25 ENCOUNTER — Emergency Department (HOSPITAL_COMMUNITY)
Admission: EM | Admit: 2016-06-25 | Discharge: 2016-06-25 | Disposition: A | Discharge status: Home / Self Care | Payer: Medicare Other | Attending: Emergency Medicine | Admitting: Emergency Medicine

## 2016-06-25 DIAGNOSIS — R21 Rash and other nonspecific skin eruption: Secondary | ICD-10-CM

## 2016-06-25 MED ORDER — HYDROCORTISONE 2.5 % EX LOTN: TOPICAL_LOTION | Freq: Two times a day (BID) | CUTANEOUS | 0 refills | Status: DC

## 2016-06-25 NOTE — Discharge Instructions (Signed)
MAKE AN APPOINTMENT AS SOON AS POSSIBLE WITH EITHER DERMATOLOGIST PROVIDED, OR WITH A DERMATOLOGIST OF YOUR CHOICE, FOR RECHECK AND FURTHER EVALUATION OF CHRONIC RASH.

## 2016-06-25 NOTE — ED Provider Notes (Signed)
[facility] DEPT Provider Note   CSN: [id number] Arrival date & time: 06/24/16  2216     History   Chief Complaint Chief Complaint  Patient presents with  . Rash    HPI Charles Schmidt is a 75 y.o. male.  Patient presents with complaint of rash to right greater than left LE's x 1 year. He reports trying topical emollients (Vaseline, grease), heat, reducing pressure (not wearing socks) to improve/resolve rash but, though heat makes it better, nothing resolves it. No LE swelling, redness, draining sores, fever.    The history is provided by the patient. No language interpreter was used.  Rash      Past Medical History:  Diagnosis Date  . Hypertension     There are no active problems to display for this patient.   History reviewed. No pertinent surgical history.     Home Medications    Prior to Admission medications   Medication Sig Start Date End Date Taking? Authorizing Provider  amLODipine (NORVASC) 5 MG tablet Take 5 mg by mouth daily.    Historical Provider, MD  dorzolamide-timolol (COSOPT) 22.3-6.8 MG/ML ophthalmic solution Place 1 drop into the left eye 2 (two) times daily.    Historical Provider, MD  finasteride (PROSCAR) 5 MG tablet Take 5 mg by mouth daily.    Historical Provider, MD  hydrochlorothiazide (HYDRODIURIL) 25 MG tablet Take 25 mg by mouth daily.    Historical Provider, MD  potassium chloride SA (K-DUR,KLOR-CON) 20 MEQ tablet Take 20 mEq by mouth daily. 08/14/14   Historical Provider, MD  tamsulosin (FLOMAX) 0.4 MG CAPS capsule Take 1 capsule (0.4 mg total) by mouth daily after supper. 10/22/14   [name], MD  Travoprost, BAK Free, (TRAVATAN) 0.004 % SOLN ophthalmic solution Place 1 drop into the left eye at bedtime.    Historical Provider, MD    Family History No family history on file.  Social History Social History  Substance Use Topics  . Smoking status: Never Smoker  . Smokeless tobacco: Not on file  . Alcohol use No      Allergies   Review of patient's allergies indicates no known allergies.   Review of Systems Review of Systems  Constitutional: Negative for fever.  Musculoskeletal: Negative for myalgias.  Skin: Positive for rash.  Neurological: Negative for weakness and numbness.     Physical Exam Updated Vital Signs BP 113/71   Pulse 81   Temp 98.2 F (36.8 C) (Oral)   Resp 18   Ht '6\' 4"'$  (1.93 m)   Wt 115.4 kg   SpO2 97%   BMI 30.98 kg/m   Physical Exam  Constitutional: He is oriented to person, place, and time. He appears well-developed and well-nourished.  Neck: Normal range of motion.  Pulmonary/Chest: Effort normal.  Musculoskeletal: Normal range of motion.  Neurological: He is alert and oriented to person, place, and time.  Skin: Skin is warm and dry.  Hyperpigmented rash to right greater than left distal LE's with thickening and scaling. Nontender. No erythema or swelling.   Psychiatric: He has a normal mood and affect.     ED Treatments / Results  Labs (all labs ordered are listed, but only abnormal results are displayed) Labs Reviewed - No data to display  EKG  EKG Interpretation None       Radiology No results found.  Procedures Procedures (including critical care time)  Medications Ordered in ED Medications - No data to display   Initial Impression / Assessment and Plan /  ED Course  I have reviewed the triage vital signs and the nursing notes.  Pertinent labs & imaging results that were available during my care of the patient were reviewed by me and considered in my medical decision making (see chart for details).  Clinical Course    Patient with chronic rash to LE's. Consider lichen planus. Does not appear c/w venous stasis. Will try topical cortisone and provide dermatology referral.   Final Clinical Impressions(s) / ED Diagnoses   Final diagnoses:  None   1. Chronic LE rash  New Prescriptions New Prescriptions   No medications on  file     Charlann Lange, PA-C 06/25/16 0201    Varney Biles, MD 06/25/16 2309

## 2016-10-02 DIAGNOSIS — Z0000 Encounter for general adult medical examination without abnormal findings: Secondary | ICD-10-CM | POA: Diagnosis not present

## 2016-10-02 DIAGNOSIS — I119 Hypertensive heart disease without heart failure: Secondary | ICD-10-CM | POA: Diagnosis not present

## 2016-10-02 DIAGNOSIS — D509 Iron deficiency anemia, unspecified: Secondary | ICD-10-CM | POA: Diagnosis not present

## 2016-10-02 DIAGNOSIS — E876 Hypokalemia: Secondary | ICD-10-CM | POA: Diagnosis not present

## 2016-10-02 DIAGNOSIS — R739 Hyperglycemia, unspecified: Secondary | ICD-10-CM | POA: Diagnosis not present

## 2016-10-02 DIAGNOSIS — N400 Enlarged prostate without lower urinary tract symptoms: Secondary | ICD-10-CM | POA: Diagnosis not present

## 2016-10-02 DIAGNOSIS — I10 Essential (primary) hypertension: Secondary | ICD-10-CM | POA: Diagnosis not present

## 2016-10-02 DIAGNOSIS — R7303 Prediabetes: Secondary | ICD-10-CM | POA: Diagnosis not present

## 2016-11-25 DIAGNOSIS — D509 Iron deficiency anemia, unspecified: Secondary | ICD-10-CM | POA: Diagnosis not present

## 2016-11-25 DIAGNOSIS — I10 Essential (primary) hypertension: Secondary | ICD-10-CM | POA: Diagnosis not present

## 2016-11-25 DIAGNOSIS — I119 Hypertensive heart disease without heart failure: Secondary | ICD-10-CM | POA: Diagnosis not present

## 2016-11-25 DIAGNOSIS — N400 Enlarged prostate without lower urinary tract symptoms: Secondary | ICD-10-CM | POA: Diagnosis not present

## 2016-11-25 DIAGNOSIS — R7303 Prediabetes: Secondary | ICD-10-CM | POA: Diagnosis not present

## 2016-11-25 DIAGNOSIS — E876 Hypokalemia: Secondary | ICD-10-CM | POA: Diagnosis not present

## 2017-03-10 DIAGNOSIS — I10 Essential (primary) hypertension: Secondary | ICD-10-CM | POA: Diagnosis not present

## 2017-03-10 DIAGNOSIS — E876 Hypokalemia: Secondary | ICD-10-CM | POA: Diagnosis not present

## 2017-03-10 DIAGNOSIS — R7303 Prediabetes: Secondary | ICD-10-CM | POA: Diagnosis not present

## 2017-03-10 DIAGNOSIS — N400 Enlarged prostate without lower urinary tract symptoms: Secondary | ICD-10-CM | POA: Diagnosis not present

## 2017-03-10 DIAGNOSIS — I119 Hypertensive heart disease without heart failure: Secondary | ICD-10-CM | POA: Diagnosis not present

## 2017-03-31 DIAGNOSIS — N400 Enlarged prostate without lower urinary tract symptoms: Secondary | ICD-10-CM | POA: Diagnosis not present

## 2017-03-31 DIAGNOSIS — L409 Psoriasis, unspecified: Secondary | ICD-10-CM | POA: Diagnosis not present

## 2017-03-31 DIAGNOSIS — L03115 Cellulitis of right lower limb: Secondary | ICD-10-CM | POA: Diagnosis not present

## 2017-03-31 DIAGNOSIS — R7303 Prediabetes: Secondary | ICD-10-CM | POA: Diagnosis not present

## 2017-03-31 DIAGNOSIS — I10 Essential (primary) hypertension: Secondary | ICD-10-CM | POA: Diagnosis not present

## 2017-03-31 DIAGNOSIS — I119 Hypertensive heart disease without heart failure: Secondary | ICD-10-CM | POA: Diagnosis not present

## 2017-04-13 DIAGNOSIS — H35371 Puckering of macula, right eye: Secondary | ICD-10-CM | POA: Diagnosis not present

## 2017-04-13 DIAGNOSIS — H2513 Age-related nuclear cataract, bilateral: Secondary | ICD-10-CM | POA: Diagnosis not present

## 2017-04-13 DIAGNOSIS — H401122 Primary open-angle glaucoma, left eye, moderate stage: Secondary | ICD-10-CM | POA: Diagnosis not present

## 2017-04-13 DIAGNOSIS — H401113 Primary open-angle glaucoma, right eye, severe stage: Secondary | ICD-10-CM | POA: Diagnosis not present

## 2017-04-17 DIAGNOSIS — I872 Venous insufficiency (chronic) (peripheral): Secondary | ICD-10-CM | POA: Diagnosis not present

## 2017-04-17 DIAGNOSIS — I8311 Varicose veins of right lower extremity with inflammation: Secondary | ICD-10-CM | POA: Diagnosis not present

## 2017-04-17 DIAGNOSIS — I8312 Varicose veins of left lower extremity with inflammation: Secondary | ICD-10-CM | POA: Diagnosis not present

## 2017-04-28 DIAGNOSIS — I119 Hypertensive heart disease without heart failure: Secondary | ICD-10-CM | POA: Diagnosis not present

## 2017-04-28 DIAGNOSIS — R7303 Prediabetes: Secondary | ICD-10-CM | POA: Diagnosis not present

## 2017-04-28 DIAGNOSIS — L03115 Cellulitis of right lower limb: Secondary | ICD-10-CM | POA: Diagnosis not present

## 2017-04-28 DIAGNOSIS — N400 Enlarged prostate without lower urinary tract symptoms: Secondary | ICD-10-CM | POA: Diagnosis not present

## 2017-04-28 DIAGNOSIS — L409 Psoriasis, unspecified: Secondary | ICD-10-CM | POA: Diagnosis not present

## 2017-04-28 DIAGNOSIS — Z113 Encounter for screening for infections with a predominantly sexual mode of transmission: Secondary | ICD-10-CM | POA: Diagnosis not present

## 2017-04-28 DIAGNOSIS — I10 Essential (primary) hypertension: Secondary | ICD-10-CM | POA: Diagnosis not present

## 2017-05-04 ENCOUNTER — Other Ambulatory Visit: Payer: Self-pay

## 2017-05-04 ENCOUNTER — Encounter: Payer: Self-pay | Admitting: Vascular Surgery

## 2017-05-04 DIAGNOSIS — E876 Hypokalemia: Secondary | ICD-10-CM

## 2017-05-04 DIAGNOSIS — D509 Iron deficiency anemia, unspecified: Secondary | ICD-10-CM | POA: Insufficient documentation

## 2017-05-04 DIAGNOSIS — I119 Hypertensive heart disease without heart failure: Secondary | ICD-10-CM | POA: Insufficient documentation

## 2017-05-04 DIAGNOSIS — L409 Psoriasis, unspecified: Secondary | ICD-10-CM | POA: Insufficient documentation

## 2017-05-04 DIAGNOSIS — I10 Essential (primary) hypertension: Secondary | ICD-10-CM | POA: Insufficient documentation

## 2017-05-04 DIAGNOSIS — R7303 Prediabetes: Secondary | ICD-10-CM | POA: Insufficient documentation

## 2017-05-04 DIAGNOSIS — N400 Enlarged prostate without lower urinary tract symptoms: Secondary | ICD-10-CM

## 2017-05-04 DIAGNOSIS — H409 Unspecified glaucoma: Secondary | ICD-10-CM | POA: Insufficient documentation

## 2017-05-04 DIAGNOSIS — I872 Venous insufficiency (chronic) (peripheral): Secondary | ICD-10-CM

## 2017-06-18 ENCOUNTER — Encounter: Payer: Self-pay | Admitting: Vascular Surgery

## 2017-06-18 ENCOUNTER — Ambulatory Visit (INDEPENDENT_AMBULATORY_CARE_PROVIDER_SITE_OTHER): Payer: Medicare Other | Admitting: Vascular Surgery

## 2017-06-18 ENCOUNTER — Ambulatory Visit (HOSPITAL_COMMUNITY)
Admission: RE | Admit: 2017-06-18 | Discharge: 2017-06-18 | Disposition: A | Discharge status: Home / Self Care | Payer: Medicare Other | Source: Ambulatory Visit | Attending: Vascular Surgery | Admitting: Vascular Surgery

## 2017-06-18 VITALS — BP 129/82 | HR 68 | Temp 97.1°F | Resp 18 | Ht 76.0 in | Wt 249.7 lb

## 2017-06-18 DIAGNOSIS — I872 Venous insufficiency (chronic) (peripheral): Secondary | ICD-10-CM | POA: Insufficient documentation

## 2017-06-18 NOTE — Progress Notes (Signed)
Referring Physician: Dr Vista Lawman  Patient name: Charles Schmidt MRN: [id number] DOB: 1941-07-31 Sex: male  REASON FOR CONSULT: Leg swelling  HPI: Charles Schmidt is a 76 y.o. male with a five-year history of intermittent leg swelling. The swelling occurs in both legs. He denies any prior history of DVT. He denies any significant trauma to his lower extremities in the past. He did have a quadriceps tendon repair in his left knee in the remote past. He states that recently he has had some itching of the skin in the lower legs but this is improved with compression stockings that he started wearing about a month ago. He denies history of diabetes or prior nonhealing wounds or ulcers in his legs. He does not smoke. Other medical problems include hypertension which is been stable.  Past Medical History:  Diagnosis Date  . Hypertension    Past Surgical History:  Procedure Laterality Date  . KNEE SURGERY Left    10-15 years ago per patient    Family History  Problem Relation Age of Onset  . Hypertension Mother   . Stroke Father     SOCIAL HISTORY: Social History   Social History  . Marital status: Legally Separated    Spouse name: N/A  . Number of children: N/A  . Years of education: N/A   Occupational History  . Not on file.   Social History Main Topics  . Smoking status: Never Smoker  . Smokeless tobacco: Never Used  . Alcohol use No  . Drug use: No  . Sexual activity: Not on file   Other Topics Concern  . Not on file   Social History Narrative  . No narrative on file    No Known Allergies  Current Outpatient Prescriptions  Medication Sig Dispense Refill  . amLODipine (NORVASC) 5 MG tablet Take 5 mg by mouth daily.    . dorzolamide-timolol (COSOPT) 22.3-6.8 MG/ML ophthalmic solution Place 1 drop into the left eye 2 (two) times daily.    . finasteride (PROSCAR) 5 MG tablet Take 5 mg by mouth daily.    . hydrochlorothiazide (HYDRODIURIL) 25 MG tablet Take 25 mg  by mouth daily.    . hydrocortisone 2.5 % lotion Apply topically 2 (two) times daily. 59 mL 0  . potassium chloride SA (K-DUR,KLOR-CON) 20 MEQ tablet Take 20 mEq by mouth daily.  4  . tamsulosin (FLOMAX) 0.4 MG CAPS capsule Take 1 capsule (0.4 mg total) by mouth daily after supper. 30 capsule 0  . Travoprost, BAK Free, (TRAVATAN) 0.004 % SOLN ophthalmic solution Place 1 drop into the left eye at bedtime.     No current facility-administered medications for this visit.     ROS:   General:  No weight loss, Fever, chills  HEENT: No recent headaches, no nasal bleeding, no visual changes, no sore throat  Neurologic: No dizziness, blackouts, seizures. No recent symptoms of stroke or mini- stroke. No recent episodes of slurred speech, or temporary blindness.  Cardiac: No recent episodes of chest pain/pressure, no shortness of breath at rest.  No shortness of breath with exertion.  Denies history of atrial fibrillation or irregular heartbeat  Vascular: No history of rest pain in feet.  No history of claudication.  No history of non-healing ulcer, No history of DVT   Pulmonary: No home oxygen, no productive cough, no hemoptysis,  No asthma or wheezing  Musculoskeletal:  [ ]  Arthritis, [ ]  Low back pain,  [ ]  Joint pain  Hematologic:No history  of hypercoagulable state.  No history of easy bleeding.  No history of anemia  Gastrointestinal: No hematochezia or melena,  No gastroesophageal reflux, no trouble swallowing  Urinary: [ ]  chronic Kidney disease, [ ]  on HD - [ ]  MWF or [ ]  TTHS, [ ]  Burning with urination, [ ]  Frequent urination, [ ]  Difficulty urinating;   Skin: No rashes  Psychological: No history of anxiety,  No history of depression   Physical Examination  Vitals:   06/18/17 1257  BP: 129/82  Pulse: 68  Resp: 18  Temp: (!) 97.1 F (36.2 C)  TempSrc: Oral  SpO2: 100%  Weight: 249 lb 11.2 oz (113.3 kg)  Height: 6\' 4"  (1.93 m)    Body mass index is 30.39  kg/m.  General:  Alert and oriented, no acute distress HEENT: Normal Neck: No bruit or JVD Pulmonary: Clear to auscultation bilaterally Cardiac: Regular Rate and Rhythm without murmur Abdomen: Soft, non-tender, non-distended, no mass, no scars Skin: Bilateral hemosiderin staining of the gaiter area of both legs. Right leg is worse than the left. 3 mm ulceration proximal aspect right medial calf granulating very thin friable skin and right medial calf Extremity Pulses:  2+ radial, brachial, femoral, dorsalis pedis, posterior tibial pulses bilaterally Musculoskeletal: No deformity trace pretibial edema bilaterally  Neurologic: Upper and lower extremity motor 5/5 and symmetric  DATA:  Patient had a venous duplex reflux exam today. This showed fairly diffuse deep vein reflux bilaterally. He also did have reflux in the lesser saphenous vein bilaterally. The greater saphenous vein was competent bilaterally. Lesser saphenous vein was 4-6 mm diameter.  ASSESSMENT:  I discussed with the patient today possibly placing him in an Unna boot for the right leg since he is starting to develop an ulcer on the right side. I also gave him the option of continuing his compression stockings. He has opted for the compression stockings currently. I discussed with him the pathophysiology of his deep vein reflux which will cause intermittent exacerbation and remission of swelling in this is best controlled with his compression stockings. He did have reflux in the superficial system primarily in the lesser saphenous vein. However, the vein diameter is fairly small and not sure he would benefit significantly from laser ablation of this. If the patient has continued problems in the future we could revisit this. He will call us if the compression stockings alone did not resolve most of his issues.   PLAN:  The patient will follow-up on as-needed basis if his symptoms become worse over time or are not improved with  compression therapy alone.   Ruta Hinds, MD [facility] Office: [phone] Pager: [phone]

## 2017-07-23 DIAGNOSIS — N401 Enlarged prostate with lower urinary tract symptoms: Secondary | ICD-10-CM | POA: Diagnosis not present

## 2017-07-28 DIAGNOSIS — L409 Psoriasis, unspecified: Secondary | ICD-10-CM | POA: Diagnosis not present

## 2017-07-28 DIAGNOSIS — I10 Essential (primary) hypertension: Secondary | ICD-10-CM | POA: Diagnosis not present

## 2017-07-28 DIAGNOSIS — Z01118 Encounter for examination of ears and hearing with other abnormal findings: Secondary | ICD-10-CM | POA: Diagnosis not present

## 2017-07-28 DIAGNOSIS — I119 Hypertensive heart disease without heart failure: Secondary | ICD-10-CM | POA: Diagnosis not present

## 2017-07-28 DIAGNOSIS — Z131 Encounter for screening for diabetes mellitus: Secondary | ICD-10-CM | POA: Diagnosis not present

## 2017-07-28 DIAGNOSIS — N400 Enlarged prostate without lower urinary tract symptoms: Secondary | ICD-10-CM | POA: Diagnosis not present

## 2017-07-28 DIAGNOSIS — R7303 Prediabetes: Secondary | ICD-10-CM | POA: Diagnosis not present

## 2017-07-28 DIAGNOSIS — H538 Other visual disturbances: Secondary | ICD-10-CM | POA: Diagnosis not present

## 2017-07-29 DIAGNOSIS — R972 Elevated prostate specific antigen [PSA]: Secondary | ICD-10-CM | POA: Diagnosis not present

## 2017-07-29 DIAGNOSIS — R351 Nocturia: Secondary | ICD-10-CM | POA: Diagnosis not present

## 2017-07-29 DIAGNOSIS — N401 Enlarged prostate with lower urinary tract symptoms: Secondary | ICD-10-CM | POA: Diagnosis not present

## 2017-09-29 DIAGNOSIS — I119 Hypertensive heart disease without heart failure: Secondary | ICD-10-CM | POA: Diagnosis not present

## 2017-09-29 DIAGNOSIS — N400 Enlarged prostate without lower urinary tract symptoms: Secondary | ICD-10-CM | POA: Diagnosis not present

## 2017-09-29 DIAGNOSIS — Z131 Encounter for screening for diabetes mellitus: Secondary | ICD-10-CM | POA: Diagnosis not present

## 2017-09-29 DIAGNOSIS — K529 Noninfective gastroenteritis and colitis, unspecified: Secondary | ICD-10-CM | POA: Diagnosis not present

## 2017-09-29 DIAGNOSIS — R7303 Prediabetes: Secondary | ICD-10-CM | POA: Diagnosis not present

## 2017-09-29 DIAGNOSIS — I10 Essential (primary) hypertension: Secondary | ICD-10-CM | POA: Diagnosis not present

## 2017-09-29 DIAGNOSIS — Z111 Encounter for screening for respiratory tuberculosis: Secondary | ICD-10-CM | POA: Diagnosis not present

## 2017-09-29 DIAGNOSIS — L409 Psoriasis, unspecified: Secondary | ICD-10-CM | POA: Diagnosis not present

## 2017-10-06 DIAGNOSIS — Z131 Encounter for screening for diabetes mellitus: Secondary | ICD-10-CM | POA: Diagnosis not present

## 2017-10-06 DIAGNOSIS — I119 Hypertensive heart disease without heart failure: Secondary | ICD-10-CM | POA: Diagnosis not present

## 2017-10-06 DIAGNOSIS — N400 Enlarged prostate without lower urinary tract symptoms: Secondary | ICD-10-CM | POA: Diagnosis not present

## 2017-10-06 DIAGNOSIS — L409 Psoriasis, unspecified: Secondary | ICD-10-CM | POA: Diagnosis not present

## 2017-10-06 DIAGNOSIS — R7303 Prediabetes: Secondary | ICD-10-CM | POA: Diagnosis not present

## 2017-10-06 DIAGNOSIS — I10 Essential (primary) hypertension: Secondary | ICD-10-CM | POA: Diagnosis not present

## 2017-10-06 DIAGNOSIS — K529 Noninfective gastroenteritis and colitis, unspecified: Secondary | ICD-10-CM | POA: Diagnosis not present

## 2017-10-07 ENCOUNTER — Other Ambulatory Visit: Payer: Self-pay | Admitting: Physician Assistant

## 2017-10-07 DIAGNOSIS — Z86010 Personal history of colonic polyps: Secondary | ICD-10-CM | POA: Diagnosis not present

## 2017-10-07 DIAGNOSIS — R634 Abnormal weight loss: Secondary | ICD-10-CM

## 2017-10-07 DIAGNOSIS — R1906 Epigastric swelling, mass or lump: Secondary | ICD-10-CM

## 2017-10-07 DIAGNOSIS — R5383 Other fatigue: Secondary | ICD-10-CM | POA: Diagnosis not present

## 2017-10-07 DIAGNOSIS — R197 Diarrhea, unspecified: Secondary | ICD-10-CM | POA: Diagnosis not present

## 2017-10-07 DIAGNOSIS — R10826 Epigastric rebound abdominal tenderness: Secondary | ICD-10-CM | POA: Diagnosis not present

## 2017-10-13 ENCOUNTER — Ambulatory Visit
Admission: RE | Admit: 2017-10-13 | Discharge: 2017-10-13 | Disposition: A | Discharge status: Home / Self Care | Payer: Medicare Other | Source: Ambulatory Visit | Attending: Physician Assistant | Admitting: Physician Assistant

## 2017-10-13 DIAGNOSIS — D490 Neoplasm of unspecified behavior of digestive system: Secondary | ICD-10-CM | POA: Diagnosis not present

## 2017-10-13 DIAGNOSIS — R634 Abnormal weight loss: Secondary | ICD-10-CM

## 2017-10-13 DIAGNOSIS — R1906 Epigastric swelling, mass or lump: Secondary | ICD-10-CM

## 2017-10-13 MED ORDER — IOPAMIDOL (ISOVUE-300) INJECTION 61%
125.0000 mL | Freq: Once | INTRAVENOUS | Status: AC | PRN
  Administered 2017-10-13: 125 mL via INTRAVENOUS

## 2017-10-14 ENCOUNTER — Encounter (HOSPITAL_COMMUNITY): Payer: Self-pay | Admitting: Emergency Medicine

## 2017-10-14 ENCOUNTER — Emergency Department (HOSPITAL_COMMUNITY)
Admission: EM | Admit: 2017-10-14 | Discharge: 2017-10-14 | Disposition: A | Discharge status: Home / Self Care | Payer: Medicare Other | Attending: Emergency Medicine | Admitting: Emergency Medicine

## 2017-10-14 DIAGNOSIS — Z79899 Other long term (current) drug therapy: Secondary | ICD-10-CM | POA: Diagnosis not present

## 2017-10-14 DIAGNOSIS — I119 Hypertensive heart disease without heart failure: Secondary | ICD-10-CM | POA: Insufficient documentation

## 2017-10-14 DIAGNOSIS — R339 Retention of urine, unspecified: Secondary | ICD-10-CM | POA: Diagnosis not present

## 2017-10-14 DIAGNOSIS — R109 Unspecified abdominal pain: Secondary | ICD-10-CM | POA: Diagnosis not present

## 2017-10-14 DIAGNOSIS — R300 Dysuria: Secondary | ICD-10-CM | POA: Diagnosis not present

## 2017-10-14 DIAGNOSIS — R338 Other retention of urine: Secondary | ICD-10-CM

## 2017-10-14 LAB — CBC WITH DIFFERENTIAL/PLATELET
BASOS PCT: 0 %
Basophils Absolute: 0 10*3/uL (ref 0.0–0.1)
Eosinophils Absolute: 0.1 10*3/uL (ref 0.0–0.7)
Eosinophils Relative: 3 %
HEMATOCRIT: 36.9 % — AB (ref 39.0–52.0)
HEMOGLOBIN: 12.6 g/dL — AB (ref 13.0–17.0)
LYMPHS PCT: 28 %
Lymphs Abs: 1.4 10*3/uL (ref 0.7–4.0)
MCH: 24.9 pg — ABNORMAL LOW (ref 26.0–34.0)
MCHC: 34.1 g/dL (ref 30.0–36.0)
MCV: 72.8 fL — AB (ref 78.0–100.0)
MONO ABS: 0.2 10*3/uL (ref 0.1–1.0)
MONOS PCT: 5 %
NEUTROS ABS: 3.2 10*3/uL (ref 1.7–7.7)
NEUTROS PCT: 64 %
Platelets: 358 10*3/uL (ref 150–400)
RBC: 5.07 MIL/uL (ref 4.22–5.81)
RDW: 16.3 % — AB (ref 11.5–15.5)
WBC: 5 10*3/uL (ref 4.0–10.5)

## 2017-10-14 LAB — URINALYSIS, ROUTINE W REFLEX MICROSCOPIC
Bilirubin Urine: NEGATIVE
Glucose, UA: NEGATIVE mg/dL
Hgb urine dipstick: NEGATIVE
Ketones, ur: NEGATIVE mg/dL
LEUKOCYTES UA: NEGATIVE
Nitrite: NEGATIVE
PH: 7 (ref 5.0–8.0)
Protein, ur: NEGATIVE mg/dL
SPECIFIC GRAVITY, URINE: 1.02 (ref 1.005–1.030)

## 2017-10-14 LAB — BASIC METABOLIC PANEL
ANION GAP: 13 (ref 5–15)
BUN: 7 mg/dL (ref 6–20)
CHLORIDE: 100 mmol/L — AB (ref 101–111)
CO2: 26 mmol/L (ref 22–32)
Calcium: 9.3 mg/dL (ref 8.9–10.3)
Creatinine, Ser: 0.9 mg/dL (ref 0.61–1.24)
GFR calc non Af Amer: >60 mL/min (ref >60)
GLUCOSE: 120 mg/dL — AB (ref 65–99)
Potassium: 3.6 mmol/L (ref 3.5–5.1)
Sodium: 139 mmol/L (ref 135–145)

## 2017-10-14 NOTE — Discharge Instructions (Signed)
Empty the leg bag when he gets full.  Call urology tomorrow to arrange follow-up.  Keep the leg bag in place until seen by urology.

## 2017-10-14 NOTE — ED Triage Notes (Signed)
Pt to ER due to urinary retention since 2 am this morning; states hx of BPH. States had CT w/ contrast yesterday due to having "diarrhea" per patient. States has hx of urinary retention requiring catheterization. NAD

## 2017-10-14 NOTE — ED Provider Notes (Signed)
[facility] EMERGENCY DEPARTMENT Provider Note   CSN: [id number] Arrival date & time: 10/14/17  0946     History   Chief Complaint Chief Complaint  Patient presents with  . Urinary Retention    HPI Rafe Mackowski is a 77 y.o. male.  Patient with a history of urinary retention in the past.  But not recently.  Followed by [facility].  Patient not been able to void since midnight.  His bladder feels very full.  No other specific complaints.      Past Medical History:  Diagnosis Date  . Hypertension     Patient Active Problem List   Diagnosis Date Noted  . Essential hypertension (primary) 05/04/2017  . Hypokalemia 05/04/2017  . Benign prostatic hyperplasia 05/04/2017  . Glaucoma 05/04/2017  . Benign hypertensive heart disease without heart failure 05/04/2017  . Microcytic anemia 05/04/2017  . Prediabetes 05/04/2017  . Hypocalcemia 05/04/2017  . Psoriasis 05/04/2017    Past Surgical History:  Procedure Laterality Date  . KNEE SURGERY Left    10-15 years ago per patient       Home Medications    Prior to Admission medications   Medication Sig Start Date End Date Taking? Authorizing Provider  amLODipine (NORVASC) 5 MG tablet Take 5 mg by mouth daily.   Yes [provider]  dorzolamide-timolol (COSOPT) 22.3-6.8 MG/ML ophthalmic solution Place 1 drop into the left eye 2 (two) times daily.   Yes [provider]  finasteride (PROSCAR) 5 MG tablet Take 5 mg by mouth daily.   Yes [provider]  hydrochlorothiazide (HYDRODIURIL) 25 MG tablet Take 25 mg by mouth daily.   Yes [provider]  hydrocortisone 2.5 % lotion Apply topically 2 (two) times daily. 06/25/16  Yes Upstill, Shari, PA-C  potassium chloride SA (K-DUR,KLOR-CON) 20 MEQ tablet Take 20 mEq by mouth daily. 08/14/14  Yes [provider]  tamsulosin (FLOMAX) 0.4 MG CAPS capsule Take 1 capsule (0.4 mg total) by mouth daily after supper.  10/22/14  Yes [name], MD  Travoprost, BAK Free, (TRAVATAN) 0.004 % SOLN ophthalmic solution Place 1 drop into the left eye at bedtime.   Yes [provider]    Family History Family History  Problem Relation Age of Onset  . Hypertension Mother   . Stroke Father     Social History Social History   Tobacco Use  . Smoking status: Never Smoker  . Smokeless tobacco: Never Used  Substance Use Topics  . Alcohol use: No  . Drug use: No     Allergies   Patient has no known allergies.   Review of Systems Review of Systems  Constitutional: Negative for fever.  HENT: Negative for congestion.   Eyes: Negative for redness.  Respiratory: Negative for shortness of breath.   Cardiovascular: Negative for chest pain.  Gastrointestinal: Positive for abdominal pain.  Genitourinary: Positive for difficulty urinating. Negative for hematuria.  Musculoskeletal: Negative for back pain.  Neurological: Negative for headaches.  Hematological: Does not bruise/bleed easily.  Psychiatric/Behavioral: Negative for confusion.     Physical Exam Updated Vital Signs BP 140/89   Pulse 73   Temp (!) 97.4 F (36.3 C) (Oral)   Resp 16   SpO2 97%   Physical Exam  Constitutional: He is oriented to person, place, and time. He appears well-developed and well-nourished. No distress.  HENT:  Head: Normocephalic and atraumatic.  Mouth/Throat: Oropharynx is clear and moist.  Eyes: Conjunctivae and EOM are normal. Pupils are  equal, round, and reactive to light.  Neck: Neck supple.  Cardiovascular: Normal rate, regular rhythm and normal heart sounds.  Pulmonary/Chest: Effort normal.  Abdominal: Soft. Bowel sounds are normal. He exhibits distension.  Bladder feels distended.  Genitourinary: Penis normal.  Musculoskeletal: Normal range of motion. He exhibits no edema.  Neurological: He is alert and oriented to person, place, and time. No cranial nerve deficit or sensory deficit. He  exhibits normal muscle tone. Coordination normal.  Skin: Skin is warm.  Nursing note and vitals reviewed.    ED Treatments / Results  Labs (all labs ordered are listed, but only abnormal results are displayed) Labs Reviewed  CBC WITH DIFFERENTIAL/PLATELET - Abnormal; Notable for the following components:      Result Value   Hemoglobin 12.6 (*)    HCT 36.9 (*)    MCV 72.8 (*)    MCH 24.9 (*)    RDW 16.3 (*)    All other components within normal limits  BASIC METABOLIC PANEL - Abnormal; Notable for the following components:   Chloride 100 (*)    Glucose, Bld 120 (*)    All other components within normal limits  URINALYSIS, ROUTINE W REFLEX MICROSCOPIC    EKG  EKG Interpretation None       Radiology Ct Abdomen Pelvis W Contrast  Result Date: 10/14/2017 CLINICAL DATA:  Loss of weight and diarrhea. EXAM: CT ABDOMEN AND PELVIS WITH CONTRAST TECHNIQUE: Multidetector CT imaging of the abdomen and pelvis was performed using the standard protocol following bolus administration of intravenous contrast. CONTRAST:  149mL ISOVUE-300 IOPAMIDOL (ISOVUE-300) INJECTION 61% Creatinine was obtained on site at [facility] at [address] Results: Creatinine 0.9 mg/dL. COMPARISON:  None. FINDINGS: Lower chest: There is a subpleural nodule overlying the posterolateral left lower lobe measuring 11 x 5 mm, image number 6 of series 2. No pleural effusions. Hepatobiliary: There hypertrophy of the caudate lobe of liver. The liver has a diffusely nodular contour. Innumerable ill defined low-attenuation lesions are identified throughout both lobes. Within the anterior right lobe of liver lesion measures 5 cm, image number 10 of series 2. Posterior right lobe of liver lesion measures 4.9 cm, image 13 of series 2. Mass in left lobe of liver measures 4.4 cm, image 18 of series 2. The gallbladder is normal. No biliary dilatation. Pancreas: Unremarkable. No pancreatic ductal dilatation or surrounding  inflammatory changes. Spleen: Normal in size without focal abnormality. Adrenals/Urinary Tract: The adrenal glands appear normal. Normal appearance of the right kidney. Left kidney cyst measures 5.1 cm, image 35 of series 2. No solid mass or hydronephrosis identified. Moderate distension of the urinary bladder. Diverticula arising from the left dome of liver measures 6.4 cm, image 68 of series 2. Stomach/Bowel: The stomach is normal. There is no pathologic dilatation of the small bowel loops. The appendix is visualized and appears normal. No pathologic dilatation of the colon. Normal appearance of the colon. Vascular/Lymphatic: Normal appearance of the abdominal aorta. Pre aortic lymph node measures 1.4 cm, image 41 of series 2. Within the central mesentery there is a soft tissue mass measuring 3.4 cm, image 52 of series 2. No pelvic or inguinal adenopathy identified. Reproductive: Prostate gland measures 4.9 by 4.8 by 7.1 cm (volume = 87 cm^3). Other: No peritoneal nodularity identified. No significant ascites or focal fluid collections identified. Musculoskeletal: Tiny lucent lesion within the left iliac bone is identified, image number 72 of series 2. Along the left SI joint there is a small posterior  iliac lucent lesion, image 74 of series 2. Well-circumscribed peripherally sclerotic lesion in the left iliac bone has a nonaggressive appearance measuring 1.6 cm. Within the right iliac bone there is a small lucent lesion measuring 5 mm, image 70 of series 2. IMPRESSION: 1. Extensive tumor is identified throughout both lobes of liver favored to represent metastatic disease. The liver has a cirrhotic appearance with a diffusely nodular contour and hypertrophy of the caudate lobe. Findings may represent primary cirrhosis or reflect secondary changes due to extensive metastatic disease. In the setting of primary cirrhosis multicentric hepatocellular carcinoma is also a potential differential consideration. Further  evaluation with tissue sampling is advised. 2. Soft tissue mass within the small bowel mesentery is identified. In the setting of diffuse metastatic disease findings may represent metastatic adenopathy versus primary carcinoid tumor. 3. Tiny lucent lesions identified within the bony pelvis which are suspicious for metastatic disease. 4. Small subpleural nodule overlying the posterolateral left lower lobe. Cannot rule out metastasis. 5. Prostate gland enlargement. Diverticula arising from the dome of bladder may reflect sequelae of chronic bladder outlet obstruction. 6. These results will be called to the ordering clinician or representative by the Radiologist Assistant, and communication documented in the PACS or zVision Dashboard. Electronically Signed   By: Kerby Moors M.D.   On: 10/14/2017 08:28    Procedures Procedures (including critical care time)  Medications Ordered in ED Medications - No data to display   Initial Impression / Assessment and Plan / ED Course  I have reviewed the triage vital signs and the nursing notes.  Pertinent labs & imaging results that were available during my care of the patient were reviewed by me and considered in my medical decision making (see chart for details).    Bladder scan was done which showed only 400 cc but based on exam and patient's history was concerned that he had more urine.  Foley catheter placed.  We got back at 900 cc of urine.  Basic labs without significant abnormalities.  Urinalysis negative for urinary tract infection.  We will switch over to a leg bag and have patient follow-up with urology.  Patient has been told that he has an enlarged prostate in the past.   Final Clinical Impressions(s) / ED Diagnoses   Final diagnoses:  Urinary retention  Acute urinary retention    ED Discharge Orders    None       Fredia Sorrow, MD 10/14/17 1332

## 2017-10-15 ENCOUNTER — Other Ambulatory Visit (HOSPITAL_COMMUNITY): Payer: Self-pay | Admitting: Physician Assistant

## 2017-10-15 DIAGNOSIS — R160 Hepatomegaly, not elsewhere classified: Secondary | ICD-10-CM

## 2017-10-15 DIAGNOSIS — D649 Anemia, unspecified: Secondary | ICD-10-CM | POA: Diagnosis not present

## 2017-10-15 DIAGNOSIS — R1906 Epigastric swelling, mass or lump: Secondary | ICD-10-CM | POA: Diagnosis not present

## 2017-10-15 DIAGNOSIS — D490 Neoplasm of unspecified behavior of digestive system: Secondary | ICD-10-CM | POA: Diagnosis not present

## 2017-10-19 DIAGNOSIS — N401 Enlarged prostate with lower urinary tract symptoms: Secondary | ICD-10-CM | POA: Diagnosis not present

## 2017-10-19 DIAGNOSIS — R972 Elevated prostate specific antigen [PSA]: Secondary | ICD-10-CM | POA: Diagnosis not present

## 2017-10-19 DIAGNOSIS — R338 Other retention of urine: Secondary | ICD-10-CM | POA: Diagnosis not present

## 2017-10-21 ENCOUNTER — Other Ambulatory Visit: Payer: Self-pay | Admitting: Radiology

## 2017-10-21 ENCOUNTER — Telehealth: Payer: Self-pay

## 2017-10-21 NOTE — Telephone Encounter (Signed)
Called patient with d/t/l of new patient appointment with Dr. Lebron Conners on 10/28/17 @ 9 AM. Patient aware to come in at 8:30 AM.

## 2017-10-22 ENCOUNTER — Other Ambulatory Visit: Payer: Self-pay | Admitting: Radiology

## 2017-10-23 ENCOUNTER — Encounter (HOSPITAL_COMMUNITY): Payer: Self-pay

## 2017-10-23 ENCOUNTER — Other Ambulatory Visit (HOSPITAL_COMMUNITY): Payer: Self-pay | Admitting: Physician Assistant

## 2017-10-23 ENCOUNTER — Ambulatory Visit (HOSPITAL_COMMUNITY)
Admission: RE | Admit: 2017-10-23 | Discharge: 2017-10-23 | Disposition: A | Discharge status: Home / Self Care | Payer: Medicare Other | Source: Ambulatory Visit | Attending: Physician Assistant | Admitting: Physician Assistant

## 2017-10-23 DIAGNOSIS — C220 Liver cell carcinoma: Secondary | ICD-10-CM | POA: Diagnosis not present

## 2017-10-23 DIAGNOSIS — N401 Enlarged prostate with lower urinary tract symptoms: Secondary | ICD-10-CM | POA: Diagnosis not present

## 2017-10-23 DIAGNOSIS — D3A8 Other benign neuroendocrine tumors: Secondary | ICD-10-CM | POA: Diagnosis not present

## 2017-10-23 DIAGNOSIS — R338 Other retention of urine: Secondary | ICD-10-CM | POA: Diagnosis not present

## 2017-10-23 DIAGNOSIS — I10 Essential (primary) hypertension: Secondary | ICD-10-CM | POA: Diagnosis not present

## 2017-10-23 DIAGNOSIS — K769 Liver disease, unspecified: Secondary | ICD-10-CM | POA: Diagnosis present

## 2017-10-23 DIAGNOSIS — R160 Hepatomegaly, not elsewhere classified: Secondary | ICD-10-CM

## 2017-10-23 DIAGNOSIS — K7689 Other specified diseases of liver: Secondary | ICD-10-CM | POA: Diagnosis not present

## 2017-10-23 HISTORY — PX: IR US GUIDE BX ASP/DRAIN: IMG2392

## 2017-10-23 LAB — CBC
HEMATOCRIT: 35.4 % — AB (ref 39.0–52.0)
Hemoglobin: 12.1 g/dL — ABNORMAL LOW (ref 13.0–17.0)
MCH: 24.6 pg — ABNORMAL LOW (ref 26.0–34.0)
MCHC: 34.2 g/dL (ref 30.0–36.0)
MCV: 72 fL — AB (ref 78.0–100.0)
PLATELETS: 227 10*3/uL (ref 150–400)
RBC: 4.92 MIL/uL (ref 4.22–5.81)
RDW: 17 % — ABNORMAL HIGH (ref 11.5–15.5)
WBC: 5.2 10*3/uL (ref 4.0–10.5)

## 2017-10-23 LAB — PROTIME-INR
INR: 1.09
Prothrombin Time: 14.1 seconds (ref 11.4–15.2)

## 2017-10-23 LAB — APTT: aPTT: 28 seconds (ref 24–36)

## 2017-10-23 MED ORDER — MIDAZOLAM HCL 2 MG/2ML IJ SOLN
INTRAMUSCULAR | Status: AC
  Filled 2017-10-23: qty 4

## 2017-10-23 MED ORDER — GELATIN ABSORBABLE 12-7 MM EX MISC
CUTANEOUS | Status: AC
  Administered 2017-10-23: 10:00:00
  Filled 2017-10-23: qty 1

## 2017-10-23 MED ORDER — FENTANYL CITRATE (PF) 100 MCG/2ML IJ SOLN
INTRAMUSCULAR | Status: AC
  Filled 2017-10-23: qty 4

## 2017-10-23 MED ORDER — LIDOCAINE HCL 1 % IJ SOLN
INTRAMUSCULAR | Status: AC
  Filled 2017-10-23: qty 20

## 2017-10-23 MED ORDER — FENTANYL CITRATE (PF) 100 MCG/2ML IJ SOLN
INTRAMUSCULAR | Status: AC | PRN
  Administered 2017-10-23: 25 ug via INTRAVENOUS
  Administered 2017-10-23: 50 ug via INTRAVENOUS

## 2017-10-23 MED ORDER — LIDOCAINE HCL (PF) 1 % IJ SOLN
INTRAMUSCULAR | Status: AC | PRN
  Administered 2017-10-23: 10 mL

## 2017-10-23 MED ORDER — SODIUM CHLORIDE 0.9 % IV SOLN
INTRAVENOUS | Status: DC
  Administered 2017-10-23: 10:00:00 via INTRAVENOUS

## 2017-10-23 MED ORDER — MIDAZOLAM HCL 2 MG/2ML IJ SOLN
INTRAMUSCULAR | Status: AC | PRN
  Administered 2017-10-23: 1 mg via INTRAVENOUS
  Administered 2017-10-23: 0.5 mg via INTRAVENOUS

## 2017-10-23 NOTE — Procedures (Signed)
Pre Procedure Dx: Liver lesion Post Procedural Dx: Same  Technically successful US guided biopsy of indeterminate lesion within the right lobe of the liver.  EBL: Minimal  No immediate complications.   Ronny Bacon, MD Pager #: [phone]

## 2017-10-23 NOTE — Sedation Documentation (Signed)
Patient is resting comfortably. 

## 2017-10-23 NOTE — H&P (Signed)
Chief Complaint: Patient was seen in consultation today for liver lesion biopsy at the request of Garrett,Tina  Referring Physician(s): Deliah Goody Dr Estell Harpin   Supervising Physician: Sandi Mariscal  Patient Status: [facility] - [facility]  History of Present Illness: Charles Schmidt is a 77 y.o. male   Pt has not felt well in yrs Onset diarrhea and wt loss few months ago Also follows with Urology for urinary retention; enlarged prostate  CT 1/22 secondary GI symptoms  IMPRESSION: 1. Extensive tumor is identified throughout both lobes of liver favored to represent metastatic disease. The liver has a cirrhotic appearance with a diffusely nodular contour and hypertrophy of the caudate lobe. Findings may represent primary cirrhosis or reflect secondary changes due to extensive metastatic disease. In the setting of primary cirrhosis multicentric hepatocellular carcinoma is also a potential differential consideration. Further evaluation with tissue sampling is advised. 2. Soft tissue mass within the small bowel mesentery is identified. In the setting of diffuse metastatic disease findings may represent metastatic adenopathy versus primary carcinoid tumor. 3. Tiny lucent lesions identified within the bony pelvis which are suspicious for metastatic disease. 4. Small subpleural nodule overlying the posterolateral left lower lobe. Cannot rule out metastasis. 5. Prostate gland enlargement. Diverticula arising from the dome of bladder may reflect sequelae of chronic bladder outlet obstruction.  Now scheduled for liver lesion biopsy   Past Medical History:  Diagnosis Date  . Hypertension     Past Surgical History:  Procedure Laterality Date  . KNEE SURGERY Left    10-15 years ago per patient    Allergies: Patient has no known allergies.  Medications: Prior to Admission medications   Medication Sig Start Date End Date Taking? Authorizing Provider  amLODipine (NORVASC) 5 MG  tablet Take 5 mg by mouth daily.    [provider]  dorzolamide-timolol (COSOPT) 22.3-6.8 MG/ML ophthalmic solution Place 1 drop into the left eye 2 (two) times daily.    [provider]  finasteride (PROSCAR) 5 MG tablet Take 5 mg by mouth daily.    [provider]  hydrochlorothiazide (HYDRODIURIL) 25 MG tablet Take 25 mg by mouth daily.    [provider]  hydrocortisone 2.5 % lotion Apply topically 2 (two) times daily. 06/25/16   Charlann Lange, PA-C  potassium chloride SA (K-DUR,KLOR-CON) 20 MEQ tablet Take 20 mEq by mouth daily. 08/14/14   [provider]  tamsulosin (FLOMAX) 0.4 MG CAPS capsule Take 1 capsule (0.4 mg total) by mouth daily after supper. 10/22/14   [name], MD  Travoprost, BAK Free, (TRAVATAN) 0.004 % SOLN ophthalmic solution Place 1 drop into the left eye at bedtime.    [provider]  doxazosin (CARDURA) 4 MG tablet Take 4 mg by mouth at bedtime.  10/22/14  [provider]     Family History  Problem Relation Age of Onset  . Hypertension Mother   . Stroke Father     Social History   Socioeconomic History  . Marital status: Legally Separated    Spouse name: None  . Number of children: None  . Years of education: None  . Highest education level: None  Social Needs  . Financial resource strain: None  . Food insecurity - worry: None  . Food insecurity - inability: None  . Transportation needs - medical: None  . Transportation needs - non-medical: None  Occupational History  . None  Tobacco Use  . Smoking status: Never Smoker  . Smokeless tobacco: Never Used  Substance and  Sexual Activity  . Alcohol use: No  . Drug use: No  . Sexual activity: None  Other Topics Concern  . None  Social History Narrative  . None    Review of Systems: A 12 point ROS discussed and pertinent positives are indicated in the HPI above.  All other systems are negative.  Review of Systems  Constitutional:  Positive for activity change, appetite change, fatigue and unexpected weight change.  Respiratory: Negative for cough and shortness of breath.   Cardiovascular: Negative for chest pain.  Gastrointestinal: Positive for abdominal pain.  Neurological: Positive for weakness.  Psychiatric/Behavioral: Negative for behavioral problems and confusion.    Vital Signs: BP 118/83   Pulse 91   Temp 97.6 F (36.4 C)   Resp 18   Ht 6\' 4"  (1.93 m)   Wt 228 lb (103.4 kg)   SpO2 99%   BMI 27.75 kg/m   Physical Exam  Constitutional: He is oriented to person, place, and time.  Cardiovascular: Normal rate, regular rhythm and normal heart sounds.  Pulmonary/Chest: Effort normal and breath sounds normal.  Abdominal: Soft. Bowel sounds are normal. There is tenderness.  Musculoskeletal: Normal range of motion.  Neurological: He is alert and oriented to person, place, and time.  Skin: Skin is warm and dry.  Psychiatric: He has a normal mood and affect. His behavior is normal. Judgment and thought content normal.  Nursing note and vitals reviewed.   Imaging: Ct Abdomen Pelvis W Contrast  Result Date: 10/14/2017 CLINICAL DATA:  Loss of weight and diarrhea. EXAM: CT ABDOMEN AND PELVIS WITH CONTRAST TECHNIQUE: Multidetector CT imaging of the abdomen and pelvis was performed using the standard protocol following bolus administration of intravenous contrast. CONTRAST:  153mL ISOVUE-300 IOPAMIDOL (ISOVUE-300) INJECTION 61% Creatinine was obtained on site at [facility] at [address] Results: Creatinine 0.9 mg/dL. COMPARISON:  None. FINDINGS: Lower chest: There is a subpleural nodule overlying the posterolateral left lower lobe measuring 11 x 5 mm, image number 6 of series 2. No pleural effusions. Hepatobiliary: There hypertrophy of the caudate lobe of liver. The liver has a diffusely nodular contour. Innumerable ill defined low-attenuation lesions are identified throughout both lobes. Within the  anterior right lobe of liver lesion measures 5 cm, image number 10 of series 2. Posterior right lobe of liver lesion measures 4.9 cm, image 13 of series 2. Mass in left lobe of liver measures 4.4 cm, image 18 of series 2. The gallbladder is normal. No biliary dilatation. Pancreas: Unremarkable. No pancreatic ductal dilatation or surrounding inflammatory changes. Spleen: Normal in size without focal abnormality. Adrenals/Urinary Tract: The adrenal glands appear normal. Normal appearance of the right kidney. Left kidney cyst measures 5.1 cm, image 35 of series 2. No solid mass or hydronephrosis identified. Moderate distension of the urinary bladder. Diverticula arising from the left dome of liver measures 6.4 cm, image 68 of series 2. Stomach/Bowel: The stomach is normal. There is no pathologic dilatation of the small bowel loops. The appendix is visualized and appears normal. No pathologic dilatation of the colon. Normal appearance of the colon. Vascular/Lymphatic: Normal appearance of the abdominal aorta. Pre aortic lymph node measures 1.4 cm, image 41 of series 2. Within the central mesentery there is a soft tissue mass measuring 3.4 cm, image 52 of series 2. No pelvic or inguinal adenopathy identified. Reproductive: Prostate gland measures 4.9 by 4.8 by 7.1 cm (volume = 87 cm^3). Other: No peritoneal nodularity identified. No significant ascites or focal fluid collections  identified. Musculoskeletal: Tiny lucent lesion within the left iliac bone is identified, image number 72 of series 2. Along the left SI joint there is a small posterior iliac lucent lesion, image 74 of series 2. Well-circumscribed peripherally sclerotic lesion in the left iliac bone has a nonaggressive appearance measuring 1.6 cm. Within the right iliac bone there is a small lucent lesion measuring 5 mm, image 70 of series 2. IMPRESSION: 1. Extensive tumor is identified throughout both lobes of liver favored to represent metastatic disease. The  liver has a cirrhotic appearance with a diffusely nodular contour and hypertrophy of the caudate lobe. Findings may represent primary cirrhosis or reflect secondary changes due to extensive metastatic disease. In the setting of primary cirrhosis multicentric hepatocellular carcinoma is also a potential differential consideration. Further evaluation with tissue sampling is advised. 2. Soft tissue mass within the small bowel mesentery is identified. In the setting of diffuse metastatic disease findings may represent metastatic adenopathy versus primary carcinoid tumor. 3. Tiny lucent lesions identified within the bony pelvis which are suspicious for metastatic disease. 4. Small subpleural nodule overlying the posterolateral left lower lobe. Cannot rule out metastasis. 5. Prostate gland enlargement. Diverticula arising from the dome of bladder may reflect sequelae of chronic bladder outlet obstruction. 6. These results will be called to the ordering clinician or representative by the Radiologist Assistant, and communication documented in the PACS or zVision Dashboard. Electronically Signed   By: Kerby Moors M.D.   On: 10/14/2017 08:28    Labs:  CBC: Recent Labs    10/14/17 1202  WBC 5.0  HGB 12.6*  HCT 36.9*  PLT 358    COAGS: No results for input(s): INR, APTT in the last 8760 hours.  BMP: Recent Labs    10/14/17 1202  NA 139  K 3.6  CL 100*  CO2 26  GLUCOSE 120*  BUN 7  CALCIUM 9.3  CREATININE 0.90  GFRNONAA >60  GFRAA >60    LIVER FUNCTION TESTS: No results for input(s): BILITOT, AST, ALT, ALKPHOS, PROT, ALBUMIN in the last 8760 hours.  TUMOR MARKERS: No results for input(s): AFPTM, CEA, CA199, CHROMGRNA in the last 8760 hours.  Assessment and Plan:  Wt loss; diarrhea Abd pain Work up reveals liver lesion; small bowel and bony lesions Scheduled for liver lesion today Risks and benefits discussed with the patient including, but not limited to bleeding, infection,  damage to adjacent structures or low yield requiring additional tests. All of the patient's questions were answered, patient is agreeable to proceed. Consent signed and in chart.  Thank you for this interesting consult.  I greatly enjoyed meeting Charles Schmidt and look forward to participating in their care.  A copy of this report was sent to the requesting provider on this date.  Electronically Signed: Lavonia Drafts, PA-C 10/23/2017, 8:51 AM   I spent a total of  30 Minutes   in face to face in clinical consultation, greater than 50% of which was counseling/coordinating care for liver lesion biopsy

## 2017-10-23 NOTE — Discharge Instructions (Addendum)
Liver Biopsy, Care After °Refer to this sheet in the next few weeks. These instructions provide you with information on caring for yourself after your procedure. Your health care provider may also give you more specific instructions. Your treatment has been planned according to current medical practices, but problems sometimes occur. Call your health care provider if you have any problems or questions after your procedure. °What can I expect after the procedure? °After your procedure, it is typical to have the following: °· A small amount of discomfort in the area where the biopsy was done and in the right shoulder or shoulder blade. °· A small amount of bruising around the area where the biopsy was done and on the skin over the liver. °· Sleepiness and fatigue for the rest of the day. ° °Follow these instructions at home: °· Rest at home for 1-2 days or as directed by your health care provider. °· Have a friend or family member stay with you for at least 24 hours. °· Because of the medicines used during the procedure, you should not do the following things in the first 24 hours: °? Drive. °? Use machinery. °? Be responsible for the care of other people. °? Sign legal documents. °? Take a bath or shower. °· There are many different ways to close and cover an incision, including stitches, skin glue, and adhesive strips. Follow your health care provider's instructions on: °? Incision care. °? Bandage (dressing) changes and removal. °? Incision closure removal. °· Do not drink alcohol in the first week. °· Do not lift more than 5 pounds or play contact sports for 2 weeks after this test. °· Take medicines only as directed by your health care provider. Do not take medicine containing aspirin or non-steroidal anti-inflammatory medicines such as ibuprofen for 1 week after this test. °· It is your responsibility to get your test results. °Contact a health care provider if: °· You have increased bleeding from an incision  that results in more than a small spot of blood. °· You have redness, swelling, or increasing pain in any incisions. °· You notice a discharge or a bad smell coming from any of your incisions. °· You have a fever or chills. °Get help right away if: °· You develop swelling, bloating, or pain in your abdomen. °· You become dizzy or faint. °· You develop a rash. °· You are nauseous or vomit. °· You have difficulty breathing, feel short of breath, or feel faint. °· You develop chest pain. °· You have problems with your speech or vision. °· You have trouble balancing or moving your arms or legs. °This information is not intended to replace advice given to you by your health care provider. Make sure you discuss any questions you have with your health care provider. °Document Released: 03/28/2005 Document Revised: 02/14/2016 Document Reviewed: 11/04/2013 °Elsevier Interactive Patient Education © 2018 [employer] °Moderate Conscious Sedation, Adult, Care After °These instructions provide you with information about caring for yourself after your procedure. Your health care provider may also give you more specific instructions. Your treatment has been planned according to current medical practices, but problems sometimes occur. Call your health care provider if you have any problems or questions after your procedure. °What can I expect after the procedure? °After your procedure, it is common: °· To feel sleepy for several hours. °· To feel clumsy and have poor balance for several hours. °· To have poor judgment for several hours. °· To vomit if you eat   too soon. ° °Follow these instructions at home: °For at least 24 hours after the procedure: ° °· Do not: °? Participate in activities where you could fall or become injured. °? Drive. °? Use heavy machinery. °? Drink alcohol. °? Take sleeping pills or medicines that cause drowsiness. °? Make important decisions or sign legal documents. °? Take care of children on your  own. °· Rest. °Eating and drinking °· Follow the diet recommended by your health care provider. °· If you vomit: °? Drink water, juice, or soup when you can drink without vomiting. °? Make sure you have little or no nausea before eating solid foods. °General instructions °· Have a responsible adult stay with you until you are awake and alert. °· Take over-the-counter and prescription medicines only as told by your health care provider. °· If you smoke, do not smoke without supervision. °· Keep all follow-up visits as told by your health care provider. This is important. °Contact a health care provider if: °· You keep feeling nauseous or you keep vomiting. °· You feel light-headed. °· You develop a rash. °· You have a fever. °Get help right away if: °· You have trouble breathing. °This information is not intended to replace advice given to you by your health care provider. Make sure you discuss any questions you have with your health care provider. °Document Released: 06/29/2013 Document Revised: 02/11/2016 Document Reviewed: 12/29/2015 °Elsevier Interactive Patient Education © 2018 [employer]vier Inc. ° °

## 2017-10-27 ENCOUNTER — Ambulatory Visit (HOSPITAL_COMMUNITY): Payer: Medicare Other

## 2017-10-28 ENCOUNTER — Inpatient Hospital Stay: Payer: Medicare Other

## 2017-10-28 ENCOUNTER — Telehealth: Payer: Self-pay | Admitting: Hematology and Oncology

## 2017-10-28 ENCOUNTER — Inpatient Hospital Stay: Payer: Medicare Other | Attending: Hematology and Oncology | Admitting: Hematology and Oncology

## 2017-10-28 ENCOUNTER — Encounter: Payer: Self-pay | Admitting: Hematology and Oncology

## 2017-10-28 VITALS — HR 72 | Temp 97.8°F | Resp 18 | Ht 76.0 in | Wt 226.9 lb

## 2017-10-28 DIAGNOSIS — R109 Unspecified abdominal pain: Secondary | ICD-10-CM | POA: Diagnosis not present

## 2017-10-28 DIAGNOSIS — R197 Diarrhea, unspecified: Secondary | ICD-10-CM | POA: Insufficient documentation

## 2017-10-28 DIAGNOSIS — C7A8 Other malignant neuroendocrine tumors: Secondary | ICD-10-CM | POA: Diagnosis not present

## 2017-10-28 DIAGNOSIS — Z79899 Other long term (current) drug therapy: Secondary | ICD-10-CM | POA: Diagnosis not present

## 2017-10-28 DIAGNOSIS — C7B8 Other secondary neuroendocrine tumors: Secondary | ICD-10-CM

## 2017-10-28 DIAGNOSIS — C7B02 Secondary carcinoid tumors of liver: Secondary | ICD-10-CM | POA: Insufficient documentation

## 2017-10-28 DIAGNOSIS — R5383 Other fatigue: Secondary | ICD-10-CM | POA: Insufficient documentation

## 2017-10-28 LAB — CBC WITH DIFFERENTIAL (CANCER CENTER ONLY)
BASOS PCT: 1 %
Basophils Absolute: 0 10*3/uL (ref 0.0–0.1)
EOS ABS: 0.1 10*3/uL (ref 0.0–0.5)
Eosinophils Relative: 3 %
HCT: 38.3 % — ABNORMAL LOW (ref 38.4–49.9)
Hemoglobin: 12.5 g/dL — ABNORMAL LOW (ref 13.0–17.1)
Lymphocytes Relative: 28 %
Lymphs Abs: 1.3 10*3/uL (ref 0.9–3.3)
MCH: 24.5 pg — ABNORMAL LOW (ref 27.2–33.4)
MCHC: 32.7 g/dL (ref 32.0–36.0)
MCV: 74.8 fL — ABNORMAL LOW (ref 79.3–98.0)
MONO ABS: 0.4 10*3/uL (ref 0.1–0.9)
Monocytes Relative: 9 %
Neutro Abs: 2.7 10*3/uL (ref 1.5–6.5)
Neutrophils Relative %: 59 %
Platelet Count: 231 10*3/uL (ref 140–400)
RBC: 5.12 MIL/uL (ref 4.20–5.82)
RDW: 16.8 % — AB (ref 11.0–14.6)
WBC Count: 4.5 10*3/uL (ref 4.0–10.3)

## 2017-10-28 LAB — CMP (CANCER CENTER ONLY)
ALBUMIN: 3.1 g/dL — AB (ref 3.5–5.0)
ALT: 34 U/L (ref 0–55)
ANION GAP: 8 (ref 3–11)
AST: 34 U/L (ref 5–34)
Alkaline Phosphatase: 204 U/L — ABNORMAL HIGH (ref 40–150)
BILIRUBIN TOTAL: 0.5 mg/dL (ref 0.2–1.2)
BUN: 10 mg/dL (ref 7–26)
CO2: 30 mmol/L — ABNORMAL HIGH (ref 22–29)
Calcium: 9.3 mg/dL (ref 8.4–10.4)
Chloride: 102 mmol/L (ref 98–109)
Creatinine: 0.91 mg/dL (ref 0.70–1.30)
GFR, Estimated: >60 mL/min (ref >60)
GLUCOSE: 103 mg/dL (ref 70–140)
POTASSIUM: 3.6 mmol/L (ref 3.5–5.1)
SODIUM: 140 mmol/L (ref 136–145)
TOTAL PROTEIN: 7.5 g/dL (ref 6.4–8.3)

## 2017-10-28 LAB — PHOSPHORUS: PHOSPHORUS: 3.7 mg/dL (ref 2.5–4.6)

## 2017-10-28 LAB — MAGNESIUM: Magnesium: 2.3 mg/dL (ref 1.5–2.5)

## 2017-10-28 NOTE — Telephone Encounter (Signed)
Scheduled appt per 2/6 los - Gave patient AVS and calender per los.

## 2017-10-28 NOTE — Progress Notes (Signed)
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 male who was in reasonably good health for his age, now with presentation with progressive weight loss, fatigue, and abdominal discomfort with findings of low-grade neuroendocrine tumor with extensive hepatic involvement and presence of mesenteric mass.  Interestingly, patient's son has been diagnosed with pancreatic neuroendocrine tumor in the past.  Patient has no symptoms to suggest carcinoid syndrome.  He is presenting diarrhea is more consistent with lactose intolerance as the pattern is clearly associated with milk-containing food intake.   Plan: - Will need to complete staging for the patient: CT of the chest, NetSpot Ga-68 Dotatate Scan. -Additional labs including chromogranin A, 24-hr urine 5-HIAA Collection -Patient already has scheduled appointment with gastroenterology tonight.  Recommend undergoing EGD as well as colonoscopy for additional evaluation -Consult genetic counselor due to family history of neuroendocrine tumors in the pancreas -Return to clinic in approximately 3 weeks with Dr. Benay Spice for continued evaluation and management discussion.  Voice recognition software was used and creation of this note. Despite my best effort at editing the text, some misspelling/errors may have occurred.  Orders Placed This Encounter  Procedures  . CT Chest W Contrast    Standing Status:   Future    Standing Expiration Date:   10/28/2018    Order Specific Question:   If indicated for the ordered procedure, I authorize the administration of contrast media per Radiology protocol    Answer:   Yes    Order Specific Question:   Preferred imaging location?    Answer:   [facility]    Order Specific Question:   Radiology Contrast Protocol - do NOT remove file path    Answer:   \\[email or web address]    Order Specific Question:   Reason for  Exam additional comments    Answer:   NET of the GI tract with liver mets, please eval lung involvement  . NM OCTREOTIDE LOCALIZATION W/SPECT    Standing Status:   Future    Standing Expiration Date:   10/28/2018    Order Specific Question:   If indicated for the ordered procedure, I authorize the administration of a radiopharmaceutical per Radiology protocol    Answer:   Yes    Order Specific Question:   Preferred imaging location?    Answer:   [facility]    Order Specific Question:   Radiology Contrast Protocol - do NOT remove file path    Answer:   \\[email or web address]    Order Specific Question:   Reason for Exam additional comments    Answer:   NET with liver mets, please eval for primary source  . CBC with Differential (Cancer Center Only)    Standing Status:   Future    Number of Occurrences:   1    Standing Expiration Date:   10/28/2018  . CMP ([facility] only)    Standing Status:   Future    Number of Occurrences:   1    Standing Expiration Date:   10/28/2018  . Magnesium    Standing Status:   Future    Number of Occurrences:   1    Standing Expiration Date:   10/28/2018  . Phosphorus    Standing Status:   Future    Number of Occurrences:   1    Standing Expiration Date:   10/28/2018  . Chromogranin A    Standing Status:   Future  Number of Occurrences:   1    Standing Expiration Date:   10/28/2018  . 5 HIAA, quantitative, urine, 24 hour    Standing Status:   Future    Standing Expiration Date:   10/28/2018  . Ambulatory referral to Genetics    Referral Priority:   Routine    Referral Type:   Consultation    Referral Reason:   Specialty Services Required    Number of Visits Requested:   1    All questions were answered.  . The patient knows to call the clinic with any problems, questions or concerns.  This note was electronically signed.    History of Presenting Illness Charles Schmidt 77 y.o. presenting to the [facility] for  diagnosis of low-grade neuroendocrine tumor involving mesentery, retroperitoneum, and liver. Referred to our clinic by  Bertha Stakes, PA-C.  Patient's past medical history significant for hypertension, glaucoma, microcytic anemia, psoriasis.  Most recent colonoscopy obtained in August 2015 with presence of adenomatous polyp in the subsequent assessment was scheduled for 20/20.  Of interest, patient's son has been diagnosed with pancreatic neuroendocrine tumor in the past.  According to the patient and daughter, disease was metastatic, but was treated with "herbal/Hoodoo" medications with complete resolution of the disease.  Patient initially presented with progressive loss of weight with at least 24 pounds lost over the past several months associated with early satiety, decreased appetite, fatigue.  During evaluation a palpable mass was identified in the epigastrium and supraumbilical area and patient underwent additional imaging as outlined below.  Subsequently, biopsy was obtained and patient was referred to our clinic for further evaluation.  At the present time, patient's complaints of diarrhea which he describes being attributed to particular food intake.  On additional conversation, it is pretty clear that the patient is lactose intolerant as the bloating, abdominal cramping, and severe diarrhea only occur after patient eats foods containing non-fermented milk products.  Patient denies having diarrhea outside of those circumstances.  Occasionally, he does take Imodium which does reduce his diarrhea intensity.  In addition, patient complains of infrequent hiccups.  He denies nausea, vomiting, abdominal pain.  Denies hematochezia or melena.  Patient denies facial flushing, lightheadedness, or dizziness.  Denies any chest pain, shortness of breath, or cough.  No pain or swelling in the neck.  patient does have recent trouble with weak urinary stream and urinary retention.  Currently, has indwelling Foley  catheter which works well for him.  Oncological/hematological History: **Neuroendocrine Tumor, low grade (likely mid-gut or pancreatic) with liver metastasis: --CT C/A/P, 10/13/17: Subpleural 1.1 cm nodule in the left lower lobe of the lung.  Hypertrophy of the caudate lobe of the liver.  Nodular liver contour.  Innumerable hypoattenuating lesions throughout the liver, largest ones measuring 5.0 cm in the anterior right lobe, 4.9 cm in the posterior right lobe, and 4.4 cm in the left lobe without associated biliary dilation.  Pancreas and spleen appear to be normal.  Normal appearance of the stomach, and colon.  1.4 cm preaortic lymph node is noted.  Also noted is a 3.4 cm mesenteric mass.  No pelvic or inguinal lymphadenopathy.  Several small lytic lesions in the skeletal structures are noted, but significance is unclear. --Labs, 10/15/17: AFP 26; tBili 0.5, AP 238, AST 41, ALT 44; WBC 5.0, Hgb 12.6, Plt 335 --Liver Bx, 10/23/17: Well-differentiated neuroendocrine neoplasm, Ki-67 ~10%. IHC -- positive for CDX2, Synaptophysin, chromogranin, CD56 & negative for CK7, CK20, CK 5/6, p63    Medical  History: Past Medical History:  Diagnosis Date  . Hypertension     Surgical History: Past Surgical History:  Procedure Laterality Date  . IR US GUIDE BX ASP/DRAIN  10/23/2017  . KNEE SURGERY Left    10-15 years ago per patient    Family History: Family History  Problem Relation Age of Onset  . Hypertension Mother   . Stroke Father     Social History: Social History   Socioeconomic History  . Marital status: Legally Separated    Spouse name: Not on file  . Number of children: Not on file  . Years of education: Not on file  . Highest education level: Not on file  Social Needs  . Financial resource strain: Not on file  . Food insecurity - worry: Not on file  . Food insecurity - inability: Not on file  . Transportation needs - medical: Not on file  . Transportation needs - non-medical: Not  on file  Occupational History  . Not on file  Tobacco Use  . Smoking status: Never Smoker  . Smokeless tobacco: Never Used  Substance and Sexual Activity  . Alcohol use: No  . Drug use: No  . Sexual activity: Not on file  Other Topics Concern  . Not on file  Social History Narrative  . Not on file    Allergies: No Known Allergies  Medications:  Current Outpatient Medications  Medication Sig Dispense Refill  . amLODipine (NORVASC) 5 MG tablet Take 5 mg by mouth daily.    . dorzolamide-timolol (COSOPT) 22.3-6.8 MG/ML ophthalmic solution Place 1 drop into the left eye 2 (two) times daily.    . finasteride (PROSCAR) 5 MG tablet Take 5 mg by mouth daily.    . hydrochlorothiazide (HYDRODIURIL) 25 MG tablet Take 25 mg by mouth daily.    . hydrocortisone 2.5 % lotion Apply topically 2 (two) times daily. 59 mL 0  . potassium chloride SA (K-DUR,KLOR-CON) 20 MEQ tablet Take 20 mEq by mouth daily.  4  . tamsulosin (FLOMAX) 0.4 MG CAPS capsule Take 1 capsule (0.4 mg total) by mouth daily after supper. 30 capsule 0  . Travoprost, BAK Free, (TRAVATAN) 0.004 % SOLN ophthalmic solution Place 1 drop into the left eye at bedtime.     No current facility-administered medications for this visit.     Review of Systems: Review of Systems  Constitutional: Positive for appetite change, fatigue and unexpected weight change. Negative for fever.  Eyes: Negative for icterus.  Respiratory: Negative for shortness of breath and wheezing.   Gastrointestinal: Positive for abdominal pain and diarrhea. Negative for abdominal distention, blood in stool, nausea and vomiting.  Skin: Negative for itching.  Neurological: Negative for dizziness and light-headedness.  Hematological: Negative for adenopathy.  All other systems reviewed and are negative.    PHYSICAL EXAMINATION Pulse 72, temperature 97.8 F (36.6 C), temperature source Oral, resp. rate 18, height 6' 4"  (1.93 m), weight 226 lb 14.4 oz (102.9 kg),  SpO2 100 %.  ECOG PERFORMANCE STATUS: 2 - Symptomatic, <50% confined to bed  Physical Exam  Constitutional: He is oriented to person, place, and time.  Elderly male who appears to be fatigued and clearly has lost significant amount of weight.  No acute distress.  HENT:  Head: Normocephalic and atraumatic.  Mouth/Throat: Oropharynx is clear and moist. No oropharyngeal exudate.  Eyes: Conjunctivae and EOM are normal. Pupils are equal, round, and reactive to light. No scleral icterus.  Neck: No thyromegaly present.  Cardiovascular: Normal  rate, regular rhythm and normal heart sounds.  No murmur heard. Pulmonary/Chest: Breath sounds normal. No respiratory distress. He has no wheezes. He has no rales.  Abdominal: Soft. Bowel sounds are normal. He exhibits mass. He exhibits no distension. There is no tenderness.  Vague poorly defined mass palpable just superior to umbilicus.  Musculoskeletal: He exhibits no edema.  Lymphadenopathy:    He has no cervical adenopathy.  Neurological: He is alert and oriented to person, place, and time. He has normal reflexes. No cranial nerve deficit.  Skin: Skin is warm and dry. No rash noted. He is not diaphoretic. No erythema. No pallor.     LABORATORY DATA: I have personally reviewed the data as listed: Appointment on 10/28/2017  Component Date Value Ref Range Status  . WBC Count 10/28/2017 4.5  4.0 - 10.3 K/uL Final  . RBC 10/28/2017 5.12  4.20 - 5.82 MIL/uL Final  . Hemoglobin 10/28/2017 12.5* 13.0 - 17.1 g/dL Final  . HCT 10/28/2017 38.3* 38.4 - 49.9 % Final  . MCV 10/28/2017 74.8* 79.3 - 98.0 fL Final  . MCH 10/28/2017 24.5* 27.2 - 33.4 pg Final  . MCHC 10/28/2017 32.7  32.0 - 36.0 g/dL Final  . RDW 10/28/2017 16.8* 11.0 - 14.6 % Final  . Platelet Count 10/28/2017 231  140 - 400 K/uL Final  . Neutrophils Relative % 10/28/2017 59  % Final  . Neutro Abs 10/28/2017 2.7  1.5 - 6.5 K/uL Final  . Lymphocytes Relative 10/28/2017 28  % Final  . Lymphs  Abs 10/28/2017 1.3  0.9 - 3.3 K/uL Final  . Monocytes Relative 10/28/2017 9  % Final  . Monocytes Absolute 10/28/2017 0.4  0.1 - 0.9 K/uL Final  . Eosinophils Relative 10/28/2017 3  % Final  . Eosinophils Absolute 10/28/2017 0.1  0.0 - 0.5 K/uL Final  . Basophils Relative 10/28/2017 1  % Final  . Basophils Absolute 10/28/2017 0.0  0.0 - 0.1 K/uL Final   Performed at [facility], [place] [address] [place], [place] [address]  . Sodium 10/28/2017 140  136 - 145 mmol/L Final  . Potassium 10/28/2017 3.6  3.5 - 5.1 mmol/L Final  . Chloride 10/28/2017 102  98 - 109 mmol/L Final  . CO2 10/28/2017 30* 22 - 29 mmol/L Final  . Glucose, Bld 10/28/2017 103  70 - 140 mg/dL Final  . BUN 10/28/2017 10  7 - 26 mg/dL Final  . Creatinine 10/28/2017 0.91  0.70 - 1.30 mg/dL Final  . Calcium 10/28/2017 9.3  8.4 - 10.4 mg/dL Final  . Total Protein 10/28/2017 7.5  6.4 - 8.3 g/dL Final  . Albumin 10/28/2017 3.1* 3.5 - 5.0 g/dL Final  . AST 10/28/2017 34  5 - 34 U/L Final  . ALT 10/28/2017 34  0 - 55 U/L Final  . Alkaline Phosphatase 10/28/2017 204* 40 - 150 U/L Final  . Total Bilirubin 10/28/2017 0.5  0.2 - 1.2 mg/dL Final  . GFR, Est Non Af Am 10/28/2017 >60  >60 mL/min Final  . GFR, Est AFR Am 10/28/2017 >60  >60 mL/min Final   Comment: (NOTE) The eGFR has been calculated using the CKD EPI equation. This calculation has not been validated in all clinical situations. eGFR's persistently <60 mL/min signify possible Chronic Kidney Disease.   Georgiann Hahn gap 10/28/2017 8  3 - 11 Final   Performed at [facility], [place] [address] [place], [place] [address]  . Magnesium 10/28/2017 2.3  1.5 -  2.5 mg/dL Final   Performed at [facility], [place] [address] [place], [place] [address]  . Phosphorus 10/28/2017 3.7  2.5 - 4.6 mg/dL Final   Performed at [place] [address], [place], [place] [address]  Hospital Outpatient  Visit on 10/23/2017  Component Date Value Ref Range Status  . aPTT 10/23/2017 28  24 - 36 seconds Final   Performed at [facility], [place] [address], [place], [place] [address]  . WBC 10/23/2017 5.2  4.0 - 10.5 K/uL Final  . RBC 10/23/2017 4.92  4.22 - 5.81 MIL/uL Final  . Hemoglobin 10/23/2017 12.1* 13.0 - 17.0 g/dL Final  . HCT 10/23/2017 35.4* 39.0 - 52.0 % Final  . MCV 10/23/2017 72.0* 78.0 - 100.0 fL Final  . MCH 10/23/2017 24.6* 26.0 - 34.0 pg Final  . MCHC 10/23/2017 34.2  30.0 - 36.0 g/dL Final  . RDW 10/23/2017 17.0* 11.5 - 15.5 % Final  . Platelets 10/23/2017 227  150 - 400 K/uL Final   Performed at [facility], [place] [address], [place], [place] [address]  . Prothrombin Time 10/23/2017 14.1  11.4 - 15.2 seconds Final  . INR 10/23/2017 1.09   Final   Performed at [place] [address] [place], [place] [address]         Ardath Sax, MD

## 2017-10-28 NOTE — Assessment & Plan Note (Signed)
77 y.o. male who was in reasonably good health for his age, now with presentation with progressive weight loss, fatigue, and abdominal discomfort with findings of low-grade neuroendocrine tumor with extensive hepatic involvement and presence of mesenteric mass.  Interestingly, patient's son has been diagnosed with pancreatic neuroendocrine tumor in the past.  Patient has no symptoms to suggest carcinoid syndrome.  He is presenting diarrhea is more consistent with lactose intolerance as the pattern is clearly associated with milk-containing food intake.   Plan: - Will need to complete staging for the patient: CT of the chest, NetSpot Ga-68 Dotatate Scan. -Additional labs including chromogranin A, 24-hr urine 5-HIAA Collection -Patient already has scheduled appointment with gastroenterology tonight.  Recommend undergoing EGD as well as colonoscopy for additional evaluation -Consult genetic counselor due to family history of neuroendocrine tumors in the pancreas -Return to clinic in approximately 3 weeks with Dr. Benay Spice for continued evaluation and management discussion.

## 2017-10-30 DIAGNOSIS — R5383 Other fatigue: Secondary | ICD-10-CM | POA: Diagnosis not present

## 2017-10-30 DIAGNOSIS — R197 Diarrhea, unspecified: Secondary | ICD-10-CM | POA: Diagnosis not present

## 2017-10-30 DIAGNOSIS — C7B02 Secondary carcinoid tumors of liver: Secondary | ICD-10-CM | POA: Diagnosis not present

## 2017-10-30 DIAGNOSIS — R109 Unspecified abdominal pain: Secondary | ICD-10-CM | POA: Diagnosis not present

## 2017-10-30 DIAGNOSIS — C7A8 Other malignant neuroendocrine tumors: Secondary | ICD-10-CM | POA: Diagnosis not present

## 2017-10-30 DIAGNOSIS — Z79899 Other long term (current) drug therapy: Secondary | ICD-10-CM | POA: Diagnosis not present

## 2017-11-02 LAB — CHROMOGRANIN A: Chromogranin A: 104 nmol/L — ABNORMAL HIGH (ref 0–5)

## 2017-11-06 LAB — 5 HIAA, QUANTITATIVE, URINE, 24 HOUR
5-HIAA, Ur: 161 mg/L
5-HIAA,Quant.,24 Hr Urine: 257.6 mg/24 hr — ABNORMAL HIGH (ref 0.0–14.9)
Total Volume: 1600

## 2017-11-20 ENCOUNTER — Telehealth: Payer: Self-pay | Admitting: Oncology

## 2017-11-20 ENCOUNTER — Encounter: Payer: Self-pay | Admitting: Oncology

## 2017-11-20 ENCOUNTER — Inpatient Hospital Stay: Payer: Medicare Other | Attending: Oncology | Admitting: Oncology

## 2017-11-20 VITALS — BP 128/83 | HR 87 | Temp 97.7°F | Resp 18 | Ht 76.0 in | Wt 219.6 lb

## 2017-11-20 DIAGNOSIS — R197 Diarrhea, unspecified: Secondary | ICD-10-CM | POA: Insufficient documentation

## 2017-11-20 DIAGNOSIS — C7B02 Secondary carcinoid tumors of liver: Secondary | ICD-10-CM | POA: Diagnosis not present

## 2017-11-20 DIAGNOSIS — R630 Anorexia: Secondary | ICD-10-CM | POA: Diagnosis not present

## 2017-11-20 DIAGNOSIS — R634 Abnormal weight loss: Secondary | ICD-10-CM | POA: Insufficient documentation

## 2017-11-20 DIAGNOSIS — C7B8 Other secondary neuroendocrine tumors: Secondary | ICD-10-CM | POA: Insufficient documentation

## 2017-11-20 DIAGNOSIS — C7A8 Other malignant neuroendocrine tumors: Secondary | ICD-10-CM | POA: Insufficient documentation

## 2017-11-20 DIAGNOSIS — E340 Carcinoid syndrome: Secondary | ICD-10-CM | POA: Insufficient documentation

## 2017-11-20 DIAGNOSIS — N400 Enlarged prostate without lower urinary tract symptoms: Secondary | ICD-10-CM | POA: Diagnosis not present

## 2017-11-20 NOTE — Progress Notes (Signed)
  Oncology Nurse Navigator Documentation  Navigator Location: [facility] (11/20/17 1049) Referral date to RadOnc/MedOnc: 10/19/17 (11/20/17 1049) )Navigator Encounter Type: Initial MedOnc (11/20/17 1049)   Abnormal Finding Date: 10/13/17 (11/20/17 1049) Confirmed Diagnosis Date: 10/13/17 (11/20/17 1049)                 Treatment Phase: Pre-Tx/Tx Discussion (11/20/17 1049) Barriers/Navigation Needs: No barriers at this time (11/20/17 1049) Patient lives alone, has transportation and supportive daughter. I encouraged patient's daughter to contact me if transportation becomes an issue.   Interventions: None required;Psycho-social support (11/20/17 1049) I provided patient and daughter folder with printed information on Neuroendocrin tumor from [email or web address] and Sandostatin from [email or web address]. as well as my contact information for further questions.            Acuity: Level 2 (11/20/17 1049)         Time Spent with Patient: 45 (11/20/17 1049)

## 2017-11-20 NOTE — Progress Notes (Signed)
[facility] OFFICE PROGRESS NOTE   Diagnosis: Metastatic carcinoid tumor  INTERVAL HISTORY:   Charles Schmidt has a history of diarrhea and weight loss for several months.  An abdominal mass was palpated at the urology center and he was referred for a CT of the abdomen and pelvis on 10/13/2017.  Tumor was noted throughout both lobes of the liver consistent with metastatic disease.  There is evidence of cirrhosis.  A soft tissue mass was noted in the small bowel mesentery.  Tiny lucent lesions were defined in the bony pelvis suspicious for metastatic disease.  A small subpleural nodule was noted at the left lower lobe.  Is enlarged. He was referred for ultrasound-guided biopsy of a right liver lesion on 10/23/2017.  Multiple core biopsies were obtained.  The pathology confirmed a well-differentiated neuroendocrine neoplasm, WHO grade 2.  He was referred to Dr. Lebron Conners is scheduled for a DOTATE scan and chest CT next week.  A chromogranin A level on 10/28/2017 returned elevated at 104, a 24-hour urine 5 HIAA was elevated at 257.6.  He is referred for ongoing oncology care.  Past medical history: 1.  Benign prostatic hypertrophy-Foley catheter in place 2.  Glaucoma 3.   Macular degeneration  Past surgical history:  1.  Left knee surgery following a quadricep rupture  Family history: His son was diagnosed with a pancreas neuroendocrine tumor at age 3  Social history: He lives in a family care home in [place].  He is a retired Chief Financial Officer.  He does not use cigarettes.  He drinks alcohol once per week.  No risk factor for HIV or hepatitis.  Review of systems: "Dark spots "on the skin, diarrhea, anorexia/weight loss Objective:  Vital signs in last 24 hours:  Blood pressure 128/83, pulse 87, temperature 97.7 F (36.5 C), temperature source Oral, resp. rate 18, height 6' 4"  (1.93 m), weight 219 lb 9.6 oz (99.6 kg), SpO2 99 %.    HEENT: Oropharynx without visible mass, neck  without mass Lymphatics: No cervical, supraclavicular, right axillary, or inguinal nodes.  "Shotty "left axillary nodes Resp: Lungs clear bilaterally Cardio: Regular rate and rhythm GI: Fullness in the right upper abdomen, nontender, no apparent ascites, no splenomegaly Vascular: No leg edema Neuro: Alert and oriented Skin: Chronic stasis change of the lower leg bilaterally, diffuse hyperpigmented macules over the trunk and extremities    Lab Results:  Lab Results  Component Value Date   WBC 4.5 10/28/2017   HGB 12.1 (L) 10/23/2017   HCT 38.3 (L) 10/28/2017   MCV 74.8 (L) 10/28/2017   PLT 231 10/28/2017   NEUTROABS 2.7 10/28/2017    CMP     Component Value Date/Time   NA 140 10/28/2017 1022   K 3.6 10/28/2017 1022   CL 102 10/28/2017 1022   CO2 30 (H) 10/28/2017 1022   GLUCOSE 103 10/28/2017 1022   BUN 10 10/28/2017 1022   CREATININE 0.91 10/28/2017 1022   CALCIUM 9.3 10/28/2017 1022   PROT 7.5 10/28/2017 1022   ALBUMIN 3.1 (L) 10/28/2017 1022   AST 34 10/28/2017 1022   ALT 34 10/28/2017 1022   ALKPHOS 204 (H) 10/28/2017 1022   BILITOT 0.5 10/28/2017 1022   GFRNONAA >60 10/28/2017 1022   GFRAA >60 10/28/2017 1022    No results found for: CEA1  Lab Results  Component Value Date   INR 1.09 10/23/2017    Imaging: CT abdomen and pelvis 10/13/2017-images reviewed with Charles Schmidt and his daughter  Medications: I have  reviewed the patient's current medications.   Assessment/Plan:  1.  Metastatic carcinoid tumor, biopsy of a liver lesion 10/23/2017 consistent with a well-differentiated neuroendocrine neoplasm, WHO grade 2,ki-67 10%  CT abdomen/pelvis 10/13/2017-extensive liver metastases, cirrhosis, soft tissue mass in the small bowel mesentery versus a primary small bowel tumor, tiny lucent lesions in the pelvic bones  Elevated chromogranin A and 24-hour urine 5-HIAA  2.  Diarrhea-likely secondary to carcinoid syndrome 3.  Anorexia/weight loss 4.  Prostatic  hypertrophy  Disposition: Charles Schmidt  has been diagnosed with a metastatic neuroendocrine tumor.  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  I discussed the diagnosis and treatment options with Charles Schmidt and his daughter.  He is scheduled to undergo a DOTATATE scan next week.   He will return to begin Sandostatin therapy after the nuclear imaging study.  He will be scheduled for an office visit and the next Sandostatin injection in approximately 1 month.  He will follow-up with urology for management of the prostatic hypertrophy.  We will consider Lutathera if there is disease progression with repeat imaging studies.  50 minutes were spent with the patient today.  The majority of the time was used for counseling and coordination of care.  Betsy Coder, MD  11/20/2017  9:29 AM

## 2017-11-20 NOTE — Telephone Encounter (Signed)
Appointments scheduled AVS/Calendar/info on Sandostatin Inj also given per 3/1 los

## 2017-11-22 ENCOUNTER — Emergency Department (HOSPITAL_COMMUNITY)
Admission: EM | Admit: 2017-11-22 | Discharge: 2017-11-22 | Disposition: A | Discharge status: Home / Self Care | Payer: Medicare Other | Attending: Emergency Medicine | Admitting: Emergency Medicine

## 2017-11-22 ENCOUNTER — Encounter (HOSPITAL_COMMUNITY): Payer: Self-pay | Admitting: Emergency Medicine

## 2017-11-22 DIAGNOSIS — Z79899 Other long term (current) drug therapy: Secondary | ICD-10-CM | POA: Insufficient documentation

## 2017-11-22 DIAGNOSIS — Z436 Encounter for attention to other artificial openings of urinary tract: Secondary | ICD-10-CM | POA: Insufficient documentation

## 2017-11-22 DIAGNOSIS — R339 Retention of urine, unspecified: Secondary | ICD-10-CM | POA: Insufficient documentation

## 2017-11-22 DIAGNOSIS — T839XXA Unspecified complication of genitourinary prosthetic device, implant and graft, initial encounter: Secondary | ICD-10-CM

## 2017-11-22 DIAGNOSIS — I10 Essential (primary) hypertension: Secondary | ICD-10-CM | POA: Insufficient documentation

## 2017-11-22 DIAGNOSIS — T83031A Leakage of indwelling urethral catheter, initial encounter: Secondary | ICD-10-CM | POA: Diagnosis not present

## 2017-11-22 LAB — CBC WITH DIFFERENTIAL/PLATELET
BASOS ABS: 0 10*3/uL (ref 0.0–0.1)
Basophils Relative: 0 %
EOS ABS: 0.1 10*3/uL (ref 0.0–0.7)
EOS PCT: 1 %
HCT: 34.8 % — ABNORMAL LOW (ref 39.0–52.0)
Hemoglobin: 11.8 g/dL — ABNORMAL LOW (ref 13.0–17.0)
LYMPHS ABS: 1.1 10*3/uL (ref 0.7–4.0)
LYMPHS PCT: 20 %
MCH: 24.7 pg — AB (ref 26.0–34.0)
MCHC: 33.9 g/dL (ref 30.0–36.0)
MCV: 72.8 fL — AB (ref 78.0–100.0)
MONO ABS: 0.3 10*3/uL (ref 0.1–1.0)
Monocytes Relative: 5 %
Neutro Abs: 4.1 10*3/uL (ref 1.7–7.7)
Neutrophils Relative %: 74 %
PLATELETS: 154 10*3/uL (ref 150–400)
RBC: 4.78 MIL/uL (ref 4.22–5.81)
RDW: 17.3 % — AB (ref 11.5–15.5)
WBC: 5.5 10*3/uL (ref 4.0–10.5)

## 2017-11-22 LAB — BASIC METABOLIC PANEL
ANION GAP: 11 (ref 5–15)
BUN: 9 mg/dL (ref 6–20)
CO2: 25 mmol/L (ref 22–32)
CREATININE: 0.89 mg/dL (ref 0.61–1.24)
Calcium: 8.7 mg/dL — ABNORMAL LOW (ref 8.9–10.3)
Chloride: 101 mmol/L (ref 101–111)
GLUCOSE: 143 mg/dL — AB (ref 65–99)
Potassium: 3 mmol/L — ABNORMAL LOW (ref 3.5–5.1)
Sodium: 137 mmol/L (ref 135–145)

## 2017-11-22 LAB — URINALYSIS, ROUTINE W REFLEX MICROSCOPIC

## 2017-11-22 LAB — CBG MONITORING, ED: GLUCOSE-CAPILLARY: 143 mg/dL — AB (ref 65–99)

## 2017-11-22 NOTE — ED Provider Notes (Signed)
Pt not urinating significantly since foley removed. Bladder scan with 500-772ml of urine. Will replace catheter. Urology FU.    Virgel Manifold, MD 11/22/17 [phone]

## 2017-11-22 NOTE — Discharge Instructions (Addendum)
Please follow-up with your urologist.  We have removed your Foley catheter today but had to replace it because you were still retaining urine. Please follow-up with urology.

## 2017-11-22 NOTE — ED Provider Notes (Signed)
TIME SEEN: 3:29 AM  CHIEF COMPLAINT: Foley catheter problem  HPI: Patient is a 77 year old male with history of hypertension who has a Foley catheter in place for urinary retention that is scheduled to be removed in 2 days who presents emergency department with leaking around the Foley catheter.  He is requesting of the catheter be removed tonight.  No fevers, abdominal pain, vomiting or diarrhea.  No hematuria.  ROS: See HPI Constitutional: no fever  Eyes: no drainage  ENT: no runny nose   Cardiovascular:  no chest pain  Resp: no SOB  GI: no vomiting GU: no dysuria Integumentary: no rash  Allergy: no hives  Musculoskeletal: no leg swelling  Neurological: no slurred speech ROS otherwise negative  PAST MEDICAL HISTORY/PAST SURGICAL HISTORY:  Past Medical History:  Diagnosis Date  . Hypertension     MEDICATIONS:  Prior to Admission medications   Medication Sig Start Date End Date Taking? Authorizing Provider  amLODipine (NORVASC) 5 MG tablet Take 5 mg by mouth daily.    [provider]  dorzolamide-timolol (COSOPT) 22.3-6.8 MG/ML ophthalmic solution Place 1 drop into the left eye 2 (two) times daily.    [provider]  finasteride (PROSCAR) 5 MG tablet Take 5 mg by mouth daily.    [provider]  hydrochlorothiazide (HYDRODIURIL) 25 MG tablet Take 25 mg by mouth daily.    [provider]  hydrocortisone 2.5 % lotion Apply topically 2 (two) times daily. 06/25/16   Charlann Lange, PA-C  potassium chloride SA (K-DUR,KLOR-CON) 20 MEQ tablet Take 20 mEq by mouth daily. 08/14/14   [provider]  tamsulosin (FLOMAX) 0.4 MG CAPS capsule Take 1 capsule (0.4 mg total) by mouth daily after supper. 10/22/14   [name], MD  Travoprost, BAK Free, (TRAVATAN) 0.004 % SOLN ophthalmic solution Place 1 drop into the left eye at bedtime.    [provider]    ALLERGIES:  No Known Allergies  SOCIAL HISTORY:  Social History    Tobacco Use  . Smoking status: Never Smoker  . Smokeless tobacco: Never Used  Substance Use Topics  . Alcohol use: Yes    Alcohol/week: 0.6 oz    Types: 1 Standard drinks or equivalent per week    Comment: week    FAMILY HISTORY: Family History  Problem Relation Age of Onset  . Hypertension Mother   . Stroke Father     EXAM: BP 135/89 (BP Location: Right Arm)   Pulse 99   Temp 98.7 F (37.1 C) (Oral)   Resp 16   Ht 6\' 4"  (1.93 m)   Wt 99.8 kg (220 lb)   SpO2 99%   BMI 26.78 kg/m  CONSTITUTIONAL: Alert and oriented and responds appropriately to questions.  Elderly, chronically ill-appearing, in no distress HEAD: Normocephalic EYES: Conjunctivae clear, pupils appear equal, EOMI ENT: normal nose; moist mucous membranes NECK: Supple, no meningismus, no nuchal rigidity, no LAD  CARD: RRR; S1 and S2 appreciated; no murmurs, no clicks, no rubs, no gallops RESP: Normal chest excursion without splinting or tachypnea; breath sounds clear and equal bilaterally; no wheezes, no rhonchi, no rales, no hypoxia or respiratory distress, speaking full sentences ABD/GI: Normal bowel sounds; non-distended; soft, non-tender, no rebound, no guarding, no peritoneal signs, no hepatosplenomegaly GU: Circumcised male, descended testes, yellow urine leaking around Foley catheter without blood or discharge BACK:  The back appears normal and is non-tender to palpation, there is no CVA tenderness EXT: Normal ROM in all joints; non-tender to palpation;  no edema; normal capillary refill; no cyanosis, no calf tenderness or swelling    SKIN: Normal color for age and race; warm; no rash NEURO: Moves all extremities equally PSYCH: The patient's mood and manner are appropriate. Grooming and personal hygiene are appropriate.  MEDICAL DECISION MAKING: Patient here with leaking around his Foley catheter.  He declines having the catheter flushed replaced.  He would like for it to be removed today.  Scheduled to  have it removed with outpatient urology in 2 days.  He is aware that if he is unable to urinate here in the emergency department and continues to have urinary retention it will need to be replaced.  Will encourage oral fluids and send urinalysis.  Foley catheter has been removed without incident.  ED PROGRESS: 6:25 AM  Pt still unable to urinate but at this time still refuses a Foley catheter and states "just give me some more time".  Will bladder scan patient.   6:30 AM  Pt has been able to urinate approximately 300 mL of blood-tinged urine.  No clots.  He feels like he is able to empty his bladder.  Will obtain bladder scan.     7:20 AM  Pt now reports that he feels very weak and feels like he almost fell in the bathroom.  States "I do not know what is wrong with me".  No chest pain or shortness of breath.  No fever.  Will check basic labs.  Still feels like he is not able to empty his bladder.  He refuses Foley catheter at this time and would like "another hour".  Will bladder scan patient.  Will obtain labs.  Signed out to Dr. Wilson Singer to follow up on labs and bladder scan.   I reviewed all nursing notes, vitals, pertinent previous records, EKGs, lab and urine results, imaging (as available).      Ward, Delice Bison, DO 11/22/17 [phone]

## 2017-11-22 NOTE — ED Triage Notes (Signed)
Reports having a foley due to being unable to empty bladder.  Seen by urology and had to replace.  This catheter has been in 30 days and is scheduled to be removed on Tuesday.  Started having leaking around meatus for the last two days.

## 2017-11-24 ENCOUNTER — Ambulatory Visit (HOSPITAL_COMMUNITY)
Admission: RE | Admit: 2017-11-24 | Discharge: 2017-11-24 | Disposition: A | Discharge status: Home / Self Care | Payer: Medicare Other | Source: Ambulatory Visit | Attending: Hematology and Oncology | Admitting: Hematology and Oncology

## 2017-11-24 ENCOUNTER — Encounter (HOSPITAL_COMMUNITY): Payer: Self-pay

## 2017-11-24 DIAGNOSIS — R911 Solitary pulmonary nodule: Secondary | ICD-10-CM | POA: Diagnosis not present

## 2017-11-24 DIAGNOSIS — C787 Secondary malignant neoplasm of liver and intrahepatic bile duct: Secondary | ICD-10-CM | POA: Insufficient documentation

## 2017-11-24 DIAGNOSIS — C7A1 Malignant poorly differentiated neuroendocrine tumors: Secondary | ICD-10-CM | POA: Diagnosis not present

## 2017-11-24 DIAGNOSIS — C782 Secondary malignant neoplasm of pleura: Secondary | ICD-10-CM | POA: Diagnosis not present

## 2017-11-24 DIAGNOSIS — R338 Other retention of urine: Secondary | ICD-10-CM | POA: Diagnosis not present

## 2017-11-24 DIAGNOSIS — R9389 Abnormal findings on diagnostic imaging of other specified body structures: Secondary | ICD-10-CM | POA: Diagnosis not present

## 2017-11-24 DIAGNOSIS — N401 Enlarged prostate with lower urinary tract symptoms: Secondary | ICD-10-CM | POA: Diagnosis not present

## 2017-11-24 DIAGNOSIS — C7B8 Other secondary neuroendocrine tumors: Secondary | ICD-10-CM | POA: Insufficient documentation

## 2017-11-24 DIAGNOSIS — R972 Elevated prostate specific antigen [PSA]: Secondary | ICD-10-CM | POA: Diagnosis not present

## 2017-11-24 LAB — URINE CULTURE

## 2017-11-24 MED ORDER — GALLIUM GA 68 DOTATATE IV KIT
5.1700 | PACK | Freq: Once | INTRAVENOUS | Status: AC
  Administered 2017-11-24: 5.17 via INTRAVENOUS

## 2017-11-24 MED ORDER — IOPAMIDOL (ISOVUE-300) INJECTION 61%
75.0000 mL | Freq: Once | INTRAVENOUS | Status: AC | PRN
  Administered 2017-11-24: 75 mL via INTRAVENOUS

## 2017-11-24 MED ORDER — SODIUM CHLORIDE 0.9 % IJ SOLN
INTRAMUSCULAR | Status: AC
  Filled 2017-11-24: qty 50

## 2017-11-24 MED ORDER — IOPAMIDOL (ISOVUE-300) INJECTION 61%
INTRAVENOUS | Status: AC
  Filled 2017-11-24: qty 75

## 2017-11-25 ENCOUNTER — Telehealth: Payer: Self-pay | Admitting: Emergency Medicine

## 2017-11-25 ENCOUNTER — Telehealth: Payer: Self-pay | Admitting: Oncology

## 2017-11-25 NOTE — Telephone Encounter (Signed)
Post ED Visit - Positive Culture Follow-up  Culture report reviewed by antimicrobial stewardship pharmacist:  []  Elenor Quinones, Pharm.D. []  Heide Guile, Pharm.D., BCPS AQ-ID []  Parks Neptune, Pharm.D., BCPS []  Alycia Rossetti, Pharm.D., BCPS []  [place], Pharm.D., BCPS, AAHIVP []  Legrand Como, Pharm.D., BCPS, AAHIVP []  Salome Arnt, PharmD, BCPS []  Jalene Mullet, PharmD []  Vincenza Hews, PharmD, BCPS [facility] PharmD  Positive urine culture Treated with none, asymptomatic, organism sensitive to the same and no further patient follow-up is required at this time.  Hazle Nordmann 11/25/2017, 12:33 PM

## 2017-11-25 NOTE — Telephone Encounter (Signed)
FAXED RECORDS TO [facility] ID [id number]

## 2017-11-26 ENCOUNTER — Inpatient Hospital Stay: Payer: Medicare Other

## 2017-11-26 VITALS — BP 139/77 | HR 72 | Temp 98.4°F | Resp 20

## 2017-11-26 DIAGNOSIS — C7B8 Other secondary neuroendocrine tumors: Secondary | ICD-10-CM | POA: Diagnosis not present

## 2017-11-26 DIAGNOSIS — R197 Diarrhea, unspecified: Secondary | ICD-10-CM | POA: Diagnosis not present

## 2017-11-26 DIAGNOSIS — C7A8 Other malignant neuroendocrine tumors: Secondary | ICD-10-CM | POA: Diagnosis not present

## 2017-11-26 DIAGNOSIS — C7B02 Secondary carcinoid tumors of liver: Secondary | ICD-10-CM | POA: Diagnosis not present

## 2017-11-26 DIAGNOSIS — R630 Anorexia: Secondary | ICD-10-CM | POA: Diagnosis not present

## 2017-11-26 DIAGNOSIS — E340 Carcinoid syndrome: Secondary | ICD-10-CM | POA: Diagnosis not present

## 2017-11-26 MED ORDER — OCTREOTIDE ACETATE 20 MG IM KIT
20.0000 mg | PACK | Freq: Once | INTRAMUSCULAR | Status: AC
  Administered 2017-11-26: 20 mg via INTRAMUSCULAR

## 2017-11-26 NOTE — Patient Instructions (Signed)

## 2017-11-30 DIAGNOSIS — C7A1 Malignant poorly differentiated neuroendocrine tumors: Secondary | ICD-10-CM | POA: Diagnosis not present

## 2017-12-01 ENCOUNTER — Inpatient Hospital Stay (HOSPITAL_BASED_OUTPATIENT_CLINIC_OR_DEPARTMENT_OTHER): Payer: Medicare Other | Admitting: Genetics

## 2017-12-01 ENCOUNTER — Inpatient Hospital Stay: Payer: Medicare Other

## 2017-12-01 DIAGNOSIS — Z808 Family history of malignant neoplasm of other organs or systems: Secondary | ICD-10-CM | POA: Diagnosis not present

## 2017-12-01 DIAGNOSIS — C7B02 Secondary carcinoid tumors of liver: Secondary | ICD-10-CM

## 2017-12-01 DIAGNOSIS — C7B8 Other secondary neuroendocrine tumors: Secondary | ICD-10-CM | POA: Diagnosis not present

## 2017-12-01 DIAGNOSIS — Z8042 Family history of malignant neoplasm of prostate: Secondary | ICD-10-CM

## 2017-12-01 DIAGNOSIS — Z809 Family history of malignant neoplasm, unspecified: Secondary | ICD-10-CM

## 2017-12-01 DIAGNOSIS — Z800 Family history of malignant neoplasm of digestive organs: Secondary | ICD-10-CM | POA: Diagnosis not present

## 2017-12-02 ENCOUNTER — Encounter: Payer: Self-pay | Admitting: Genetics

## 2017-12-02 DIAGNOSIS — Z808 Family history of malignant neoplasm of other organs or systems: Secondary | ICD-10-CM

## 2017-12-02 DIAGNOSIS — Z809 Family history of malignant neoplasm, unspecified: Secondary | ICD-10-CM | POA: Insufficient documentation

## 2017-12-02 DIAGNOSIS — Z8042 Family history of malignant neoplasm of prostate: Secondary | ICD-10-CM | POA: Insufficient documentation

## 2017-12-02 NOTE — Progress Notes (Signed)
REFERRING PROVIDER: Ardath Sax, MD [place], [place] [address]  PRIMARY PROVIDER:  Elizabeth Palau, MD (Inactive)  PRIMARY REASON FOR VISIT:  1. Metastatic malignant neuroendocrine tumor to liver ([place])   2. Family history of cancer in son   3. Family history of prostate cancer   4. Secondary neuroendocrine tumor of liver ([place])     HISTORY OF PRESENT ILLNESS:   Charles Schmidt, a 77 y.o. male, was seen for a [place] cancer genetics consultation at the request of Charles Schmidt due to a personal and family history of neuroendocrine tumors.  Charles Schmidt presents to clinic today to discuss the possibility of a hereditary predisposition to cancer, genetic testing, and to further clarify his future cancer risks, as well as potential cancer risks for family members.   In 2019, at the age of 28, Charles Schmidt was diagnosed with a metastatic neuroendocrine tumor, most likely small bowel carcinoid tumor with liver metastases.   He also has benign prostatic hypertrophy.  He is undergoing chemotherapy at this time.   Colonoscopy: 2015; reportedly 3 polyps . Patient denies any history of Thyroid disease, prior history of abnormal calcium level, brain tumors, kidney stones, other tumors/growths, and parathyroid tumors.     Past Medical History:  Diagnosis Date  . Cancer ([place])   . Family history of cancer in son   . Family history of prostate cancer   . Hypertension     Past Surgical History:  Procedure Laterality Date  . IR US GUIDE BX ASP/DRAIN  10/23/2017  . KNEE SURGERY Left    10-15 years ago per patient    Social History   Socioeconomic History  . Marital status: Legally Separated    Spouse name: None  . Number of children: None  . Years of education: None  . Highest education level: None  Social Needs  . Financial resource strain: None  . Food insecurity - worry: None  . Food insecurity - inability: None  . Transportation needs - medical: None  .  Transportation needs - non-medical: None  Occupational History  . None  Tobacco Use  . Smoking status: Never Smoker  . Smokeless tobacco: Never Used  Substance and Sexual Activity  . Alcohol use: Yes    Alcohol/week: 0.6 oz    Types: 1 Standard drinks or equivalent per week    Comment: week  . Drug use: No  . Sexual activity: None  Other Topics Concern  . None  Social History Narrative  . None     FAMILY HISTORY:  We obtained a detailed, 4-generation family history.  Significant diagnoses are listed below: Family History  Problem Relation Age of Onset  . Hypertension Mother   . Stroke Father   . Prostate cancer Father        'slow growing'  . Kidney Stones Sister   . Cancer Son 56       neuroendocrine tumors of small intestine   Charles Schmidt has a 85 year-old daughter with no cancer and no children.  Charles Schmidt has another daughter who is 22 with no history of cancer.  Charles Schmidt has a son who is now 78 and has a history of a small intestine neuroendocrine tumors identified and treated when he was 19.  His son has not had any genetic testing.  His son has 3 children.   Charles Schmidt has 4 sisters, one of which has had kidney stones.  No sisters with any history of  cancer.  Charles Schmidt has 1 brother with no history of cancer.  No nieces/nephews with any history of cancer.   Charles Schmidt father is deceased due to a stroke.  He had a history of 'slow growing' prostate cancer.  Charles Schmidt reports he has paternal aunts and uncles, but is unsure how many and if they have had any history of cancer. Charles Schmidt is not aware of any paternal cousins with any history of cancers/tumors. Charles Schmidt paternal grandparents have no history of cancer.   Charles Schmidt mother is 74 with no history of cancer.  Charles Schmidt has 2 maternal aunts and a maternal uncle with no history of cancer.  No history of cancer in any maternal cousins.  Charles Schmidt maternal grandparents died in their 65;s and  he has no information about them and does not know their cause of death.   Charles Schmidt is unaware of previous family history of genetic testing for hereditary cancer risks. Patient's maternal ancestors are of African American descent, and paternal ancestors are of African American descent. There is no reported Ashkenazi Jewish ancestry. There is no known consanguinity.  GENETIC COUNSELING ASSESSMENT: Charles Schmidt is a 77 y.o. male with a personal and family history which is somewhat suggestive of a Hereditary Cancer Predisposition Syndrome. We, therefore, discussed and recommended the following at today's visit.   DISCUSSION: We reviewed the characteristics, features and inheritance patterns of hereditary cancer syndromes. We also discussed genetic testing, including the appropriate family members to test, the process of testing, insurance coverage and turn-around-time for results. We discussed the implications of a negative, positive and/or variant of uncertain significant result. We recommended Charles Schmidt pursue genetic testing for the Common Hereditary Cancers Panel + Hyperparathyroidism panel + GIST panel.   [facility] Common Hereditary Cancers Panel: APC, ATM, AXIN2, BARD1, BMPR1A, BRCA1, BRCA2, BRIP1, CDH1, CDK4, CDKN2A, CHEK2, CTNNA1, DICER1, EPCAM, GREM1, HOXB13, KIT, MEN1, MLH1, MSH2, MSH3, MSH6, MUTYH, NBN, NF1, NTHL1, PALB2, PDGFRA, PMS2, POLD1, POLE, PTEN, RAD50, RAD51C, RAD51D, SDHA, SDHB, SDHC, SDHD, SMAD4, SMARCA4, STK11, TP53, TSC1, TSC2, VHL  [facility] Familial Gastrointestinal Stromal Tumor Syndrome Panel: KIT, NF1, PDGFRA, SDHA, SDHB, SDHC, SDHD  [facility] Hyperparathyroidism Panel: CASR, CDC73, CDKN1B, MEN1, RET  We discussed that only 5-10% of cancers are associated with a Hereditary Cancer Predisposition Syndrome.    Knowledge regarding carcinoid/neuroendocrine tumors and hereditary cancer risk syndromes is still emerging, however we known that several Cancer Predisposition  Syndromes are associated with Carcinoid tumors.    One major syndrome that increases neuroendocrine/carcinoid tumor risk is called Multiple endocrine neoplasia (MEN).  There are 4 different subtypes of this syndrome, and is caused by mutations in the MEN1, RET, and CDKN1B genes.   Multiple endocrine neoplasia type 1 in particular causes an increased risk for both cancerous and non-cancerous tumors in the parathyroid gland, the pituitary gland, and pancreas.  It also causes increased risk for carcinoid tumors and hyperparathyroidism.  There is some evidence suggesting that there might be an increased risk for thyroid growths such as colloid goiters, adenomas, and carcinomas due to MEN type 1. We discussed that there are several other cancer predisposition syndromes caused by mutations in several other genes that cause increased risk for neuroendocrine cancers, and several other cancer types.    We discussed that if he is found to have a mutation in one of these genes, it may impact future medical management recommendations such as increased cancer screenings and consideration of risk reducing surgeries.  A positive result  could also have implications for the patient's family members.  A Negative result would mean we were unable to identify a hereditary component to her cancer, but does not rule out the possibility of a hereditary basis for her cancer.  There could be mutations that are undetectable by current technology, or in genes not yet tested or identified to increase cancer risk.    We discussed the potential to find a Variant of Uncertain Significance or VUS.  These are variants that have not yet been identified as pathogenic or benign, and it is unknown if this variant is associated with increased cancer risk or if this is a normal finding.  Most VUS's are reclassified to benign or likely benign.   It should not be used to make medical management decisions. With time, we suspect the lab will  determine the significance of any VUS's identified if any.   We discussed that if his out of pocket cost for testing is over $100, the laboratory will call and confirm whether he wants to proceed with testing.  If the out of pocket cost of testing is less than $100 he will be billed by the genetic testing laboratory.   PLAN: After considering the risks, benefits, and limitations, Mr. Burzynski  provided informed consent to pursue genetic testing and the blood sample was sent to [facility] for analysis of the Common Hereditary Cancers Panel + Hyperparathyroid Panel + GIST Panel. Results should be available within approximately 2-3 weeks' time, at which point they will be disclosed by telephone to Mr. Bumpus, as will any additional recommendations warranted by these results. Mr. Zabriskie will receive a summary of his genetic counseling visit and a copy of his results once available. This information will also be available in Epic. We encouraged Mr. Kloosterman to remain in contact with cancer genetics annually so that we can continuously update the family history and inform him of any changes in cancer genetics and testing that may be of benefit for his family. Mr. Trahan questions were answered to his satisfaction today. Our contact information was provided should additional questions or concerns arise.  Based on Mr. Vowels's family history, we recommended his son, who was diagnosed with neuroendocrine tumros at age 55, have genetic counseling and testing. Mr. Lammert will let us know if we can be of any assistance in coordinating genetic counseling and/or testing for this family member.   Lastly, we encouraged Mr. Tellefsen to remain in contact with cancer genetics annually so that we can continuously update the family history and inform him of any changes in cancer genetics and testing that may be of benefit for this family.   Mr.  Ancheta questions were answered to his satisfaction today. Our  contact information was provided should additional questions or concerns arise. Thank you for the referral and allowing [place] to share in the care of your patient.   Tana Felts, MS, [facility] Certified Genetic Counselor [email or web address] phone: [phone]  The patient was seen for a total of 40 minutes in face-to-face genetic counseling. The patient was accompanied today by his daughter.  Intern, Murriel Hopper, was present and observed this session.

## 2017-12-06 ENCOUNTER — Encounter (HOSPITAL_COMMUNITY): Payer: Self-pay | Admitting: Emergency Medicine

## 2017-12-06 ENCOUNTER — Other Ambulatory Visit: Payer: Self-pay

## 2017-12-06 DIAGNOSIS — Z79899 Other long term (current) drug therapy: Secondary | ICD-10-CM | POA: Insufficient documentation

## 2017-12-06 DIAGNOSIS — Y69 Unspecified misadventure during surgical and medical care: Secondary | ICD-10-CM | POA: Insufficient documentation

## 2017-12-06 DIAGNOSIS — T83091A Other mechanical complication of indwelling urethral catheter, initial encounter: Secondary | ICD-10-CM | POA: Insufficient documentation

## 2017-12-06 DIAGNOSIS — T83098A Other mechanical complication of other indwelling urethral catheter, initial encounter: Secondary | ICD-10-CM | POA: Diagnosis not present

## 2017-12-06 NOTE — ED Triage Notes (Signed)
Ptatient presents to ED for assessment of catheter due to inability to empty his bladder.  States he is getting leaking around the meatus, as well as lower abdominal spasms.

## 2017-12-07 ENCOUNTER — Emergency Department (HOSPITAL_COMMUNITY)
Admission: EM | Admit: 2017-12-07 | Discharge: 2017-12-07 | Disposition: A | Discharge status: Home / Self Care | Payer: Medicare Other | Attending: Emergency Medicine | Admitting: Emergency Medicine

## 2017-12-07 DIAGNOSIS — T83091A Other mechanical complication of indwelling urethral catheter, initial encounter: Secondary | ICD-10-CM

## 2017-12-07 NOTE — ED Notes (Signed)
Pt reports that his catheter will not drain. Pt believes it is clogged up. Pt complains of abd pain and pain at the tip of his penis.

## 2017-12-07 NOTE — ED Provider Notes (Signed)
TIME SEEN: 2:57 AM  CHIEF COMPLAINT: Leaking around Foley catheter  HPI: Pt is a 77 y.o. 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  Last Foley catheter was changed in the emergency department on 11/23/17.  He has no other complaints at this time.  ROS: See HPI Constitutional: no fever  Eyes: no drainage  ENT: no runny nose   Cardiovascular:  no chest pain  Resp: no SOB  GI: no vomiting GU: no dysuria Integumentary: no rash  Allergy: no hives  Musculoskeletal: no leg swelling  Neurological: no slurred speech ROS otherwise negative  PAST MEDICAL HISTORY/PAST SURGICAL HISTORY:  Past Medical History:  Diagnosis Date  . Cancer ([place])   . Family history of cancer in son   . Family history of prostate cancer   . Hypertension     MEDICATIONS:  Prior to Admission medications   Medication Sig Start Date End Date Taking? Authorizing Provider  amLODipine (NORVASC) 5 MG tablet Take 5 mg by mouth daily.    [provider]  dorzolamide-timolol (COSOPT) 22.3-6.8 MG/ML ophthalmic solution Place 1 drop into the left eye 2 (two) times daily.    [provider]  finasteride (PROSCAR) 5 MG tablet Take 5 mg by mouth daily.    [provider]  hydrochlorothiazide (HYDRODIURIL) 25 MG tablet Take 25 mg by mouth daily.    [provider]  hydrocortisone 2.5 % lotion Apply topically 2 (two) times daily. 06/25/16   Charlann Lange, PA-C  potassium chloride SA (K-DUR,KLOR-CON) 20 MEQ tablet Take 20 mEq by mouth daily. 08/14/14   [provider]  tamsulosin (FLOMAX) 0.4 MG CAPS capsule Take 1 capsule (0.4 mg total) by mouth daily after supper. 10/22/14   [name], MD  Travoprost, BAK Free, (TRAVATAN) 0.004 % SOLN ophthalmic solution Place 1 drop into the left eye at bedtime.     [provider]    ALLERGIES:  No Known Allergies  SOCIAL HISTORY:  Social History   Tobacco Use  . Smoking status: Never Smoker  . Smokeless tobacco: Never Used  Substance Use Topics  . Alcohol use: Yes    Alcohol/week: 0.6 oz    Types: 1 Standard drinks or equivalent per week    Comment: week    FAMILY HISTORY: Family History  Problem Relation Age of Onset  . Hypertension Mother   . Stroke Father   . Prostate cancer Father        'slow growing'  . Kidney Stones Sister   . Cancer Son 37       neuroendocrine tumors of small intestine    EXAM: BP (!) 153/90 (BP Location: Right Arm)   Pulse 77   Temp 98.6 F (37 C) (Oral)   Resp 15   SpO2 100%  CONSTITUTIONAL: Alert and oriented and responds appropriately to questions.  Elderly, afebrile, no distress, smiling and laughing HEAD: Normocephalic EYES: Conjunctivae clear, pupils appear equal, EOMI ENT: normal nose; moist mucous membranes NECK: Supple, no meningismus, no nuchal rigidity, no LAD  CARD: RRR; S1 and S2 appreciated; no murmurs, no clicks, no rubs, no gallops RESP: Normal chest excursion without splinting or tachypnea; breath sounds clear and equal bilaterally; no wheezes, no rhonchi, no rales, no hypoxia or respiratory distress, speaking full sentences ABD/GI: Normal bowel sounds; non-distended; soft, non-tender, no rebound, no guarding, no peritoneal  signs, no hepatosplenomegaly GU: Foley catheter in place which is a 75 [place], there is clear urine leaking around the catheter from the urethral meatus but no discharge or blood, no scrotal swelling BACK:  The back appears normal and is non-tender to palpation, there is no CVA tenderness EXT: Normal ROM in all joints; non-tender to palpation; no edema; normal capillary refill; no cyanosis, no calf tenderness or swelling    SKIN: Normal color for age and race; warm; no rash NEURO: Moves all extremities equally PSYCH: The patient's mood and manner are  appropriate. Grooming and personal hygiene are appropriate.  MEDICAL DECISION MAKING: Patient here needing his Foley catheter replaced.  We have tried to irrigate the catheter without any success.  He only has approximately 60 mL in his bladder at this time but has been leaking around the catheter.  28 [place] in place.  We will replace the catheter today.  He agrees with this plan and will follow up with his urologist as scheduled.   At this time, I do not feel there is any life-threatening condition present. I have reviewed and discussed all results (EKG, imaging, lab, urine as appropriate) and exam findings with patient/family. I have reviewed nursing notes and appropriate previous records.  I feel the patient is safe to be discharged home without further emergent workup and can continue workup as an outpatient as needed. Discussed usual and customary return precautions. Patient/family verbalize understanding and are comfortable with this plan.  Outpatient follow-up has been provided if needed. All questions have been answered.      Elam Ellis, Delice Bison, DO 12/07/17 [phone]

## 2017-12-10 DIAGNOSIS — R338 Other retention of urine: Secondary | ICD-10-CM | POA: Diagnosis not present

## 2017-12-10 DIAGNOSIS — C7A8 Other malignant neuroendocrine tumors: Secondary | ICD-10-CM | POA: Diagnosis not present

## 2017-12-10 DIAGNOSIS — N401 Enlarged prostate with lower urinary tract symptoms: Secondary | ICD-10-CM | POA: Diagnosis not present

## 2017-12-10 DIAGNOSIS — N400 Enlarged prostate without lower urinary tract symptoms: Secondary | ICD-10-CM | POA: Diagnosis not present

## 2017-12-10 DIAGNOSIS — C7B02 Secondary carcinoid tumors of liver: Secondary | ICD-10-CM | POA: Diagnosis not present

## 2017-12-11 DIAGNOSIS — R338 Other retention of urine: Secondary | ICD-10-CM | POA: Diagnosis not present

## 2017-12-11 DIAGNOSIS — N401 Enlarged prostate with lower urinary tract symptoms: Secondary | ICD-10-CM | POA: Diagnosis not present

## 2017-12-11 DIAGNOSIS — N323 Diverticulum of bladder: Secondary | ICD-10-CM | POA: Diagnosis not present

## 2017-12-14 ENCOUNTER — Encounter: Payer: Self-pay | Admitting: Genetics

## 2017-12-18 ENCOUNTER — Encounter: Payer: Self-pay | Admitting: Genetics

## 2017-12-18 ENCOUNTER — Ambulatory Visit: Payer: Self-pay | Admitting: Genetics

## 2017-12-18 ENCOUNTER — Telehealth: Payer: Self-pay | Admitting: Genetics

## 2017-12-18 ENCOUNTER — Other Ambulatory Visit: Payer: Self-pay | Admitting: Urology

## 2017-12-18 DIAGNOSIS — Z8042 Family history of malignant neoplasm of prostate: Secondary | ICD-10-CM

## 2017-12-18 DIAGNOSIS — C7B8 Other secondary neuroendocrine tumors: Secondary | ICD-10-CM

## 2017-12-18 DIAGNOSIS — Z808 Family history of malignant neoplasm of other organs or systems: Principal | ICD-10-CM

## 2017-12-18 DIAGNOSIS — Z1379 Encounter for other screening for genetic and chromosomal anomalies: Secondary | ICD-10-CM

## 2017-12-18 DIAGNOSIS — Z809 Family history of malignant neoplasm, unspecified: Secondary | ICD-10-CM

## 2017-12-18 NOTE — Progress Notes (Signed)
HPI: Charles Schmidt was previously seen in the [facility] on 12/01/2017 due to a personal and family history of neuroendocrine tumors and concerns regarding a hereditary predisposition to cancer. Please refer to our prior cancer genetics clinic note for more information regarding Charles Schmidt's medical, social and family histories, and our assessment and recommendations, at the time. Charles Schmidt recent genetic test results were disclosed to him, as well as recommendations warranted by these results. These results and recommendations are discussed in more detail below.  CANCER HISTORY:    Metastatic malignant neuroendocrine tumor to liver ([place])   10/28/2017 Initial Diagnosis    Metastatic malignant neuroendocrine tumor to liver ([place])      12/08/2017 Genetic Testing    The following genes were evaluated for sequence changes and exonic deletions/duplications: APC, ATM, AXIN2, BARD1, BMPR1A, BRCA1, BRCA2, BRIP1, CASR, CDC73, CDH1, CDK4, CDKN1B, CDKN2A (p14ARF), CDKN2A (p16INK4a), CHEK2, CTNNA1, DICER1, EPCAM*, GREM1*, KIT, MEN1, MLH1, MSH2, MSH3, MSH6, MUTYH, NBN, NF1, PALB2, PDGFRA, PMS2, POLD1, POLE, PTEN, RAD50, RAD51C, RAD51D, RET, SDHB, SDHC, SDHD, SMAD4, SMARCA4, STK11, TP53, TSC1, TSC2, VHL. The following genes were evaluated for sequence changes only: HOXB13*, NTHL1*, SDHA  Results: Negative, no pathogenic variants identified. The date of this test report is 12/08/2017.        FAMILY HISTORY:  We obtained a detailed, 4-generation family history.  Significant diagnoses are listed below: Family History  Problem Relation Age of Onset  . Hypertension Mother   . Stroke Father   . Prostate cancer Father        'slow growing'  . Kidney Stones Sister   . Cancer Son 26       neuroendocrine tumors of small intestine    Charles Schmidt has a 40 year-old daughter with no cancer and no children.  Charles Schmidt has another daughter who is 36 with no history of cancer.  Charles Schmidt has  a son who is now 73 and has a history of a small intestine neuroendocrine tumors identified and treated when he was 58.  His son has not had any genetic testing.  His son has 3 children.   Charles Schmidt has 4 sisters, one of which has had kidney stones.  No sisters with any history of cancer.  Charles Schmidt has 1 brother with no history of cancer.  No nieces/nephews with any history of cancer.   Charles Schmidt father is deceased due to a stroke.  He had a history of 'slow growing' prostate cancer.  Charles Schmidt reports he has paternal aunts and uncles, but is unsure how many and if they have had any history of cancer. Ms. Schmidt is not aware of any paternal cousins with any history of cancers/tumors. Charles Schmidt paternal grandparents have no history of cancer.   Charles Schmidt mother is 72 with no history of cancer.  Charles Schmidt has 2 maternal aunts and a maternal uncle with no history of cancer.  No history of cancer in any maternal cousins.  Charles Schmidt maternal grandparents died in their 67;s and he has no information about them and does not know their cause of death.   Charles Schmidt is unaware of previous family history of genetic testing for hereditary cancer risks. Patient's maternal ancestors are of African American descent, and paternal ancestors are of African American descent. There is no reported Ashkenazi Jewish ancestry. There is no known consanguinity.  GENETIC TEST RESULTS: Genetic testing performed through [facility] Common Hereditary Cancer Panel + Hyperparathyroidism Panel + GIST  Panel reported out on 12/08/2017 showed no pathogenic mutations. The following genes were evaluated for sequence changes and exonic deletions/duplications: APC, ATM, AXIN2, BARD1, BMPR1A, BRCA1, BRCA2, BRIP1, CASR, CDC73, CDH1, CDK4, CDKN1B, CDKN2A (p14ARF), CDKN2A (p16INK4a), CHEK2, CTNNA1, DICER1, EPCAM*, GREM1*, KIT, MEN1, MLH1, MSH2, MSH3, MSH6, MUTYH, NBN, NF1, PALB2, PDGFRA, PMS2, POLD1, POLE, PTEN, RAD50,  RAD51C, RAD51D, RET, SDHB, SDHC, SDHD, SMAD4, SMARCA4, STK11, TP53, TSC1, TSC2, VHL The following genes were evaluated for sequence changes only: HOXB13*, NTHL1*, SDHA.   The test report will be scanned into EPIC and will be located under the Molecular Pathology section of the Results Review tab.A portion of the result report is included below for reference.     We discussed with Charles Schmidt that because current genetic testing is not perfect, it is possible there may be a gene mutation in one of these genes that current testing cannot detect, but that chance is small. We also discussed, that there could be another gene that has not yet been discovered, or that we have not yet tested, that is responsible for the cancer diagnoses in the family. It is also possible there is a hereditary cause for the cancer in the family that Charles Schmidt did not inherit and therefore was not identified in his testing.  Therefore, it is important to remain in touch with cancer genetics in the future so that we can continue to offer Charles Schmidt the most up to date genetic testing.   ADDITIONAL GENETIC TESTING: We discussed with Charles Schmidt that there are other genes that are associated with increased cancer risk that can be analyzed. The laboratories that offer this testing look at these additional genes via a hereditary cancer gene panel. Should Charles Schmidt wish to pursue additional genetic testing, we are happy to discuss and coordinate this testing, at any time.    CANCER SCREENING RECOMMENDATIONS: This negative result means that we were unable to identify a hereditary cause for his personal and family history of cancer at this time.  However, this result does not mean cannot definitively rule out  a hereditary basis for his cancer. He and his family do display some of the characteristics of a hereditary predisposition to cancer (similar types of cancer in multiple relatives in different generations, younger ages of  diagnosis, etc)  It is possible that there could be genetic mutations that are undetectable by current technology, or genetic mutations in genes that have not been tested or identified to increase cancer risk.    Therefore, it is recommended he continue to follow the cancer management and screening guidelines provided by his oncology and primary healthcare provider. Other factors such as her personal and family history may still affect his cancer risk.  RECOMMENDATIONS FOR FAMILY MEMBERS: Individuals in this family might be at some increased risk of developing cancer, over the general population risk, simply due to the family history of cancer. We recommended women in this family have a yearly mammogram beginning at age 74, or 59 years younger than the earliest onset of cancer, an annual clinical breast exam, and perform monthly breast self-exams. Women in this family should also have a gynecological exam as recommended by their primary provider. All family members should have a colonoscopy by age 20.  All family members should inform their physicians about the family history of cancer so their doctors can make the most appropriate screening recommendations for them.   Based on Mr. Mercado's family history, we recommended his son with a small intestine  neuroendocrine tumor at 76, also have genetic counseling and testing. Mr. Paynter will let us know if we can be of any assistance in coordinating genetic counseling and/or testing for this family member.   FOLLOW-UP: Lastly, we discussed with Mr. Wierzbicki that cancer genetics is a rapidly advancing field and it is possible that new genetic tests will be appropriate for him and/or his family members in the future. We encouraged him to remain in contact with cancer genetics on an annual basis so we can update his personal and family histories and let him know of advances in cancer genetics that may benefit this family.   Our contact number was provided. Mr.  Klimowicz questions were answered to his satisfaction, and he knows he is welcome to call us at anytime with additional questions or concerns.   Ferol Luz, MS, [facility] Certified Genetic Counselor [email or web address]

## 2017-12-18 NOTE — Telephone Encounter (Signed)
Revealed negative genetic testing.    We discussed that we do not know why he has  cancer or why there is cancer in the family. It could be due to a different gene that we are not testing, or maybe our current technology may not be able to pick something up.  It will be important for him to keep in contact with genetics to learn if additional testing may be needed in the future.  Recommended his son (with neuroendocrine tumor at 62) also have genetic testing.  Relatives should inform their doctors about the family history .

## 2017-12-21 DIAGNOSIS — R338 Other retention of urine: Secondary | ICD-10-CM | POA: Diagnosis not present

## 2017-12-21 DIAGNOSIS — N401 Enlarged prostate with lower urinary tract symptoms: Secondary | ICD-10-CM | POA: Diagnosis not present

## 2017-12-24 ENCOUNTER — Inpatient Hospital Stay: Payer: Medicare Other

## 2017-12-24 ENCOUNTER — Telehealth: Payer: Self-pay | Admitting: Oncology

## 2017-12-24 ENCOUNTER — Inpatient Hospital Stay: Payer: Medicare Other | Attending: Oncology | Admitting: Nurse Practitioner

## 2017-12-24 ENCOUNTER — Encounter: Payer: Self-pay | Admitting: Nurse Practitioner

## 2017-12-24 VITALS — BP 136/85 | HR 63 | Temp 98.7°F | Resp 17 | Ht 76.0 in | Wt 218.2 lb

## 2017-12-24 DIAGNOSIS — R197 Diarrhea, unspecified: Secondary | ICD-10-CM | POA: Insufficient documentation

## 2017-12-24 DIAGNOSIS — C7B02 Secondary carcinoid tumors of liver: Secondary | ICD-10-CM | POA: Diagnosis not present

## 2017-12-24 DIAGNOSIS — C7B8 Other secondary neuroendocrine tumors: Secondary | ICD-10-CM | POA: Insufficient documentation

## 2017-12-24 DIAGNOSIS — R634 Abnormal weight loss: Secondary | ICD-10-CM | POA: Insufficient documentation

## 2017-12-24 DIAGNOSIS — R630 Anorexia: Secondary | ICD-10-CM | POA: Insufficient documentation

## 2017-12-24 MED ORDER — OCTREOTIDE ACETATE 20 MG IM KIT
PACK | INTRAMUSCULAR | Status: AC
  Filled 2017-12-24: qty 1

## 2017-12-24 MED ORDER — OCTREOTIDE ACETATE 20 MG IM KIT
20.0000 mg | PACK | Freq: Once | INTRAMUSCULAR | Status: AC
  Administered 2017-12-24: 20 mg via INTRAMUSCULAR

## 2017-12-24 NOTE — Progress Notes (Addendum)
  [facility] OFFICE PROGRESS NOTE   Diagnosis: Metastatic carcinoid tumor  INTERVAL HISTORY:   Charles Schmidt returns as scheduled.  He began monthly Sandostatin 11/26/2017.  He notes significant improvement in the diarrhea.  He estimates 2 bowel movements a day, occasionally loose.  He takes Imodium if needed.  He denies any flushing episodes.  He denies pain.  He reports a good appetite.  Objective:  Vital signs in last 24 hours:  Blood pressure 136/85, pulse 63, temperature 98.7 F (37.1 C), temperature source Oral, resp. rate 17, height '6\' 4"'$  (1.93 m), weight 218 lb 3.2 oz (99 kg), SpO2 100 %.    HEENT: No thrush or ulcers. Resp: Lungs clear bilaterally. Cardio: Regular rate and rhythm. GI: Fullness right upper abdomen.  No apparent ascites. Vascular: Chronic stasis change lower legs bilaterally.   Neuro: Alert and oriented. Skin: Scattered hyperpigmented macules trunk and extremities.   Lab Results:  Lab Results  Component Value Date   WBC 5.5 11/22/2017   HGB 11.8 (L) 11/22/2017   HCT 34.8 (L) 11/22/2017   MCV 72.8 (L) 11/22/2017   PLT 154 11/22/2017   NEUTROABS 4.1 11/22/2017    Imaging:  No results found.  Medications: I have reviewed the patient's current medications.  Assessment/Plan: 1.  Metastatic carcinoid tumor, biopsy of a liver lesion 10/23/2017 consistent with a well-differentiated neuroendocrine neoplasm, WHO grade 2,ki-67 10%  CT abdomen/pelvis 10/13/2017-extensive liver metastases, cirrhosis, soft tissue mass in the small bowel mesentery versus a primary small bowel tumor, tiny lucent lesions in the pelvic bones  Elevated chromogranin A and 24-hour urine 5-HIAA  CT chest 11/24/2017- subpleural nodule in the left lower lobe with associated radiotracer activity on comparison DOTATATE PET scan.  No additional evidence of thoracic metastasis.  DOTATATE PET scan 11/24/2017-intense radiotracer accumulation with innumerable confluent hepatic  metastasis; intense radiotracer activity within central mesenteric mass; 2 foci of uptake associated with the small bowel; intense radiotracer activity associated periaortic and paraspinal lymph nodes; more distant solitary small metastasis within the pleural space left lower lobe.  Monthly Sandostatin initiated 11/26/2017  2.  Diarrhea-likely secondary to carcinoid syndrome 3.  Anorexia/weight loss 4.  Prostatic hypertrophy; has indwelling Foley catheter.  Followed by Dr. Junious Silk.    Disposition: Charles Schmidt appears stable.  Dr. Benay Spice reviewed the [name] PET scan images with him at today's visit.  We are referring him for Lutathera.  Plan to continue monthly Sandostatin for now.  He has noted significant improvement in the diarrhea since beginning Sandostatin.  He continues Imodium as needed.  He will return for a follow-up visit and Sandostatin in 1 month.  He will contact the office in the interim with any problems.  Patient seen with Dr. Benay Spice.  25 minutes were spent face-to-face at today's visit with the majority of that time involved in counseling/coordination of care.   Tauno Falotico ANP/GNP-BC   12/24/2017  10:03 AM  This was a shared visit with Ned Card.  Charles Schmidt has been diagnosed with metastatic carcinoid tumor.  The DOTATATE scan is consistent with a small bowel primary and metastatic disease to the abdominal lymph nodes and liver.  We reviewed the PET images with him today.  The diarrhea has improved with Sandostatin.  He will continue Sandostatin.  We will refer him for consideration of Lutathera.  Julieanne Manson, MD

## 2017-12-24 NOTE — Telephone Encounter (Signed)
Appointments scheduled AVS/Calendar printed per 4/4 los

## 2017-12-24 NOTE — Patient Instructions (Signed)

## 2017-12-28 ENCOUNTER — Encounter (HOSPITAL_BASED_OUTPATIENT_CLINIC_OR_DEPARTMENT_OTHER): Payer: Self-pay | Admitting: *Deleted

## 2017-12-28 ENCOUNTER — Other Ambulatory Visit: Payer: Self-pay

## 2017-12-28 NOTE — Progress Notes (Signed)
SPOKE W/ PT VIA PHONE FOR PRE-OP INTERVIEW.  NPO AFTER MN.  ARRIVE AT 0900.  NEEDS ISTAT 8.  CURRENT EKG IN CHART AND Epic.

## 2018-01-02 ENCOUNTER — Encounter (HOSPITAL_COMMUNITY): Payer: Self-pay

## 2018-01-02 ENCOUNTER — Other Ambulatory Visit: Payer: Self-pay

## 2018-01-02 ENCOUNTER — Emergency Department (HOSPITAL_COMMUNITY)
Admission: EM | Admit: 2018-01-02 | Discharge: 2018-01-02 | Disposition: A | Discharge status: Home / Self Care | Payer: Medicare Other | Attending: Emergency Medicine | Admitting: Emergency Medicine

## 2018-01-02 DIAGNOSIS — N3001 Acute cystitis with hematuria: Secondary | ICD-10-CM | POA: Insufficient documentation

## 2018-01-02 DIAGNOSIS — I10 Essential (primary) hypertension: Secondary | ICD-10-CM | POA: Diagnosis not present

## 2018-01-02 DIAGNOSIS — Z79899 Other long term (current) drug therapy: Secondary | ICD-10-CM | POA: Insufficient documentation

## 2018-01-02 DIAGNOSIS — N390 Urinary tract infection, site not specified: Secondary | ICD-10-CM | POA: Diagnosis not present

## 2018-01-02 DIAGNOSIS — Y829 Unspecified medical devices associated with adverse incidents: Secondary | ICD-10-CM | POA: Diagnosis not present

## 2018-01-02 DIAGNOSIS — R338 Other retention of urine: Secondary | ICD-10-CM

## 2018-01-02 DIAGNOSIS — R339 Retention of urine, unspecified: Secondary | ICD-10-CM | POA: Insufficient documentation

## 2018-01-02 DIAGNOSIS — T83038A Leakage of other indwelling urethral catheter, initial encounter: Secondary | ICD-10-CM | POA: Diagnosis present

## 2018-01-02 LAB — URINALYSIS, ROUTINE W REFLEX MICROSCOPIC
BILIRUBIN URINE: NEGATIVE
GLUCOSE, UA: NEGATIVE mg/dL
Ketones, ur: NEGATIVE mg/dL
NITRITE: POSITIVE — AB
Protein, ur: 30 mg/dL — AB
SPECIFIC GRAVITY, URINE: 1.013 (ref 1.005–1.030)
SQUAMOUS EPITHELIAL / LPF: NONE SEEN
pH: 6 (ref 5.0–8.0)

## 2018-01-02 MED ORDER — CIPROFLOXACIN HCL 500 MG PO TABS
500.0000 mg | ORAL_TABLET | Freq: Once | ORAL | Status: AC
  Administered 2018-01-02: 500 mg via ORAL
  Filled 2018-01-02: qty 1

## 2018-01-02 NOTE — ED Provider Notes (Signed)
[facility] EMERGENCY DEPARTMENT Provider Note   CSN: [id number] Arrival date & time: 01/02/18  8546     History   Chief Complaint Chief Complaint  Patient presents with  . Foley Problem    HPI Charles Schmidt is a 77 y.o. male.  HPI  Early male with multiple medical issues including small bowel tumor, BPH presents with Foley catheter leakage. He notes he has some central medical problems, but denies fever, chills, vomiting, new pain. Over the past day the patient has developed leakage around his Foley catheter and diminished output into the catheter itself. He is scheduled for a TURP procedure in 2 days.  Past Medical History:  Diagnosis Date  . BPH with obstruction/lower urinary tract symptoms   . Cataract, mature    12-28-2017  per pt right eye, legally blind  . Diarrhea concurrent with and due to carcinoid syndrome ([place])    12-28-2017 improvement since started Sandostatin monthly  . ED (erectile dysfunction)   . Elevated PSA   . Family history of cancer in son   . Family history of prostate cancer   . Foley catheter in place   . Glaucoma, left eye   . History of colon polyps   . History of urinary retention    2015; 2016; 01/ 2019  . Hypertension   . Metastatic malignant neuroendocrine tumor to liver [facility]) oncoloigst-  dr Benay Spice   dx 10-23-2017 by liver bx-- WHO grade 2; small bowel mesenteric mass, cirrhosis, mets left lower lobe solitary nodule ,  periaortic and paraspinal lymph nodes METS--  started monthly Sandostatin 11-20-2017  . Microcytic anemia   . RBBB (right bundle branch block with left anterior fascicular block)   . Urinary retention   . Venous reflux     Patient Active Problem List   Diagnosis Date Noted  . Genetic testing 12/18/2017  . Family history of cancer in son   . Family history of prostate cancer   . Metastatic malignant neuroendocrine tumor to liver ([place]) 10/28/2017  . Essential hypertension (primary) 05/04/2017    . Hypokalemia 05/04/2017  . Benign prostatic hyperplasia 05/04/2017  . Glaucoma 05/04/2017  . Benign hypertensive heart disease without heart failure 05/04/2017  . Microcytic anemia 05/04/2017  . Prediabetes 05/04/2017  . Hypocalcemia 05/04/2017  . Psoriasis 05/04/2017    Past Surgical History:  Procedure Laterality Date  . INGUINAL HERNIA REPAIR  1990s   unsure side  . IR US GUIDE BX ASP/DRAIN  10/23/2017  . QUADRICEPS TENDON REPAIR Left 01-08-2011   dr Veverly Fells  . TONSILLECTOMY  child        Home Medications    Prior to Admission medications   Medication Sig Start Date End Date Taking? Authorizing Provider  amLODipine (NORVASC) 5 MG tablet Take 5 mg by mouth every evening.     [provider]  dorzolamide-timolol (COSOPT) 22.3-6.8 MG/ML ophthalmic solution Place 1 drop into the left eye 2 (two) times daily.     [provider]  finasteride (PROSCAR) 5 MG tablet Take 5 mg by mouth every evening.     [provider]  hydrochlorothiazide (HYDRODIURIL) 25 MG tablet Take 25 mg by mouth every evening.     [provider]  Multiple Vitamins-Minerals (CENTRUM SILVER PO) Take by mouth daily.    [provider]  potassium chloride (K-DUR) 10 MEQ tablet Take 10 mEq by mouth every evening.  11/19/17   [provider]  Saw Palmetto, Serenoa repens, (SAW PALMETTO PO)  Take by mouth daily.    [provider]  tamsulosin (FLOMAX) 0.4 MG CAPS capsule Take 1 capsule (0.4 mg total) by mouth daily after supper. Patient taking differently: Take 0.4 mg by mouth daily after supper.  10/22/14   [name], MD  Travoprost, BAK Free, (TRAVATAN) 0.004 % SOLN ophthalmic solution Place 1 drop into the left eye at bedtime.     [provider]    Family History Family History  Problem Relation Age of Onset  . Hypertension Mother   . Stroke Father   . Prostate cancer Father        'slow growing'  . Kidney Stones Sister   .  Cancer Son 43       neuroendocrine tumors of small intestine    Social History Social History   Tobacco Use  . Smoking status: Never Smoker  . Smokeless tobacco: Never Used  Substance Use Topics  . Alcohol use: Not Currently    Alcohol/week: 0.6 oz    Types: 1 Standard drinks or equivalent per week    Comment: week  . Drug use: No     Allergies   Patient has no known allergies.   Review of Systems Review of Systems  Constitutional:       Per HPI, otherwise negative  HENT:       Per HPI, otherwise negative  Respiratory:       Per HPI, otherwise negative  Cardiovascular:       Per HPI, otherwise negative  Gastrointestinal: Negative for vomiting.  Endocrine:       Negative aside from HPI  Genitourinary:       Neg aside from HPI   Musculoskeletal:       Per HPI, otherwise negative  Skin: Negative.   Allergic/Immunologic: Positive for immunocompromised state.  Neurological: Negative for syncope.     Physical Exam Updated Vital Signs BP (!) 145/95   Pulse 86   Temp 98.8 F (37.1 C) (Oral)   Resp 18   SpO2 99%   Physical Exam  Constitutional: He is oriented to person, place, and time. He has a sickly appearance. No distress.  HENT:  Head: Normocephalic and atraumatic.  Eyes: Conjunctivae and EOM are normal.  Pulmonary/Chest: Effort normal. No stridor. No respiratory distress.  Abdominal: He exhibits no distension.  Genitourinary: Penis normal.     Musculoskeletal: He exhibits no deformity.  Neurological: He is alert and oriented to person, place, and time.  Skin: Skin is warm and dry.  Psychiatric: He has a normal mood and affect.  Nursing note and vitals reviewed.    ED Treatments / Results  Labs (all labs ordered are listed, but only abnormal results are displayed) Labs Reviewed  URINALYSIS, ROUTINE W REFLEX MICROSCOPIC - Abnormal; Notable for the following components:      Result Value   APPearance CLOUDY (*)    Hgb urine dipstick MODERATE  (*)    Protein, ur 30 (*)    Nitrite POSITIVE (*)    Leukocytes, UA LARGE (*)    Bacteria, UA RARE (*)    All other components within normal limits  URINE CULTURE     Procedures Procedures (including critical care time)  Medications Ordered in ED Ciprofloxacin  Initial Impression / Assessment and Plan / ED Course  I have reviewed the triage vital signs and the nursing notes.  Pertinent labs & imaging results that were available during my care of the patient were reviewed by me and considered  in my medical decision making (see chart for details).  10:59 AM Patient in no distress, sitting on the edge of his bed. We discussed all findings. Interestingly, the patient was about to start prophylactic antibiotics for his upcoming TURP procedure. Remains awake, alert, interactive, and the Foley catheter continues to drain fluid appropriately. No evidence for obstruction, no evidence for bacteremia, sepsis. Patient will start ciprofloxacin, follow-up with his urologist.  Final Clinical Impressions(s) / ED Diagnoses  Urinary obstruction Urinary tract infection   Carmin Muskrat, MD 01/02/18 1059

## 2018-01-02 NOTE — Discharge Instructions (Signed)
As discussed, your evaluation today has been largely reassuring.  But, it is important that you monitor your condition carefully, and do not hesitate to return to the ED if you develop new, or concerning changes in your condition.  These take your previously prescribed ciprofloxacin as directed.  Otherwise, please follow-up with your physician for appropriate ongoing care.

## 2018-01-02 NOTE — ED Triage Notes (Signed)
Pt states that he his cathter began to leaking and needs irrigation. Has not been changed in a while, draining urine.  Pt is afebrile

## 2018-01-02 NOTE — ED Notes (Signed)
Pt here for foley problem. Foley not draining properly and has not been changed in a while. Dr Vanita Panda ordered to change foley.

## 2018-01-02 NOTE — ED Notes (Signed)
Pt verbalized understanding of d/c instructions and has no further questions. VSS, NAD.  

## 2018-01-03 LAB — URINE CULTURE

## 2018-01-04 ENCOUNTER — Emergency Department (HOSPITAL_COMMUNITY)
Admission: EM | Admit: 2018-01-04 | Discharge: 2018-01-05 | Disposition: A | Discharge status: Home / Self Care | Payer: Medicare Other | Source: Home / Self Care

## 2018-01-04 ENCOUNTER — Encounter (HOSPITAL_COMMUNITY): Payer: Self-pay | Admitting: Emergency Medicine

## 2018-01-04 ENCOUNTER — Other Ambulatory Visit: Payer: Self-pay

## 2018-01-04 DIAGNOSIS — I10 Essential (primary) hypertension: Secondary | ICD-10-CM | POA: Diagnosis not present

## 2018-01-04 DIAGNOSIS — Z5321 Procedure and treatment not carried out due to patient leaving prior to being seen by health care provider: Secondary | ICD-10-CM | POA: Insufficient documentation

## 2018-01-04 DIAGNOSIS — N401 Enlarged prostate with lower urinary tract symptoms: Secondary | ICD-10-CM | POA: Diagnosis not present

## 2018-01-04 DIAGNOSIS — N323 Diverticulum of bladder: Secondary | ICD-10-CM | POA: Diagnosis not present

## 2018-01-04 DIAGNOSIS — Y658 Other specified misadventures during surgical and medical care: Secondary | ICD-10-CM

## 2018-01-04 DIAGNOSIS — Z79899 Other long term (current) drug therapy: Secondary | ICD-10-CM | POA: Diagnosis not present

## 2018-01-04 DIAGNOSIS — T83018A Breakdown (mechanical) of other indwelling urethral catheter, initial encounter: Secondary | ICD-10-CM | POA: Insufficient documentation

## 2018-01-04 DIAGNOSIS — R338 Other retention of urine: Secondary | ICD-10-CM | POA: Diagnosis not present

## 2018-01-04 MED ORDER — SODIUM CHLORIDE 0.9 % IV SOLN
2.0000 g | Freq: Once | INTRAVENOUS | Status: DC
  Filled 2018-01-04: qty 20

## 2018-01-04 NOTE — H&P (Signed)
Office Visit Report     12/11/2017   --------------------------------------------------------------------------------   Charles Schmidt  MRN: [id number]  PRIMARY CARE:  [facility]  DOB: 1941/01/15, 77 year old Male  REFERRING:  Azucena Fallen, NP  SSN: -[id number]  PROVIDER:  Festus Aloe, M.D.    LOCATION:  [facility] [phone]   --------------------------------------------------------------------------------   CC: BPH  HPI: Charles Schmidt is a 77 year-old male established patient who is here for follow up regarding further evaluation of BPH and lower urinary tract symptoms.  Patient is currently treated with tamsuosin, finasteride. for his symptoms.   Patient with a history of urinary retention in 2015. He underwent evaluation with cystoscopy (trilobar hypertrophy and tic at dome. Prostatic urethra 6 cm) and Urodynamics studies showed a large capacity, hypersensitive bladder with obstructive pattern. He had good voluntary detrusor contractions with Dr. Janice Norrie. January 2016 prostate ultrasound revealed 126 g prostate.   His PVR's ran high about 400 ml. Pt on tams and finasteride. No Hydro on imaging.   He had pain for inability to pass urine January 2019. Foley catheter was placed with 900 cc output. U/a clear. Cr 0.7. CT showed bph, large ,median lobe. He was found to have metastatic neuroendocrine tumor. 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 He is scheduled to begin Sandostatin therapy for further management. He was seen at the ED on 11/22/17 with complaints of leakage from around his catheter. He demanded catheter be removed and this was done. Bladder scan several hours later showed residual of >500 cc and catheter was replaced. Creatinine noted to be 0.89.   He failed a voiding trial.     ALLERGIES: No Allergies    MEDICATIONS: Tamsulosin Hcl 0.4 mg capsule TAKE 1 CAPSULE BY  MOUTH EVERY DAY  Dorzolamide-Timolol 22.3 mg-6.8 mg/ml drops Ophthalmic  Finasteride 5 mg tablet 0 Oral  Hydrochlorothiazide 25 mg tablet Oral  Norvasc 5 mg tablet Oral  Travatan Z 0.004 % drops Ophthalmic     GU PSH: None     PSH Notes: Knee Surgery, Inguinal Hernia Repair   NON-GU PSH: None   GU PMH: BPH w/LUTS - 11/24/2017, - 10/19/2017, - 07/29/2017, - 04/14/2016, Benign prostatic hyperplasia with urinary obstruction, - 2017 Elevated PSA - 11/24/2017, - 10/19/2017, - 07/29/2017, - 04/14/2016, Elevated prostate specific antigen (PSA), - 2016 Urinary Retention, Other retention of urine - 2017 ED due to arterial insufficiency, Erectile dysfunction due to arterial insufficiency - 2016 Urinary Retention, Unspec, Urinary retention - 2016 Nocturia, Nocturia - 2016 Urinary Frequency, Urinary frequency - 2016 Urinary Tract Inf, Unspec site, Urinary tract infection - 2015      PMH Notes: recent CT scan showed 2 masses on Liver 10/13/16, biopsy to take place on 10/23/17 at [facility]    NON-GU PMH: Encounter for general adult medical examination without abnormal findings, Encounter for preventive health examination - 2016 Personal history of other diseases of the circulatory system, History of hypertension - 2014    FAMILY HISTORY: Family Health Status Number - Father Prostate Cancer - Runs In Family Stroke Syndrome - Father   SOCIAL HISTORY: Marital Status: Divorced Preferred Language: English; Ethnicity: Not Hispanic Or Latino; Race: Black or African American Current Smoking Status: Patient has never smoked.  Has never drank.  Drinks 1 caffeinated drink per day.     Notes: Never A Smoker, Caffeine Use, Death In The Family Father, Marital History - Single,  Alcohol Use, Tobacco Use   REVIEW OF SYSTEMS:    GU Review Male:   Patient denies burning/ pain with urination, penile pain, stream starts and stops, trouble starting your stream, hard to postpone urination, have to strain to urinate ,  erection problems, get up at night to urinate, frequent urination, and leakage of urine.  Gastrointestinal (Upper):   Patient denies nausea, vomiting, and indigestion/ heartburn.  Gastrointestinal (Lower):   Patient denies diarrhea and constipation.  Constitutional:   Patient denies fever, night sweats, weight loss, and fatigue.  Skin:   Patient denies skin rash/ lesion and itching.  Eyes:   Patient denies blurred vision and double vision.  Ears/ Nose/ Throat:   Patient denies sore throat and sinus problems.  Hematologic/Lymphatic:   Patient denies swollen glands and easy bruising.  Cardiovascular:   Patient denies leg swelling and chest pains.  Respiratory:   Patient denies cough and shortness of breath.  Endocrine:   Patient denies excessive thirst.  Musculoskeletal:   Patient denies back pain and joint pain.  Neurological:   Patient denies headaches and dizziness.  Psychologic:   Patient denies depression and anxiety.   VITAL SIGNS:      12/11/2017 02:14 PM  BP 127/80 mmHg  Pulse 60 /min  Temperature 97.6 F / 36.4 C   GU PHYSICAL EXAMINATION:    Scrotum: No lesions. No edema. No cysts. No warts.  Penis: Circumcised, no warts, no cracks. No dorsal Peyronie's plaques, no left corporal Peyronie's plaques, no right corporal Peyronie's plaques, no scarring, no warts. No balanitis, no meatal stenosis.   MULTI-SYSTEM PHYSICAL EXAMINATION:    Constitutional: Well-nourished. No physical deformities. Normally developed. Good grooming.  Neck: Neck symmetrical, not swollen. Normal tracheal position.  Respiratory: No labored breathing, no use of accessory muscles. Normal breath sounds.  Cardiovascular: Regular rate and rhythm. No murmur, no gallop. Normal temperature, normal extremity pulses, no swelling, no varicosities.  Skin: No paleness, no jaundice, no cyanosis. No lesion, no ulcer, no rash.  Neurologic / Psychiatric: Oriented to time, oriented to place, oriented to person. No depression,  no anxiety, no agitation.  Gastrointestinal: No mass, no tenderness, no rigidity, non obese abdomen.     PAST DATA REVIEWED:  Source Of History:  Patient  X-Ray Review: PET Scan: Reviewed Films. mar 2019  C.T. Abdomen/Pelvis: Reviewed Films. jan 2019     07/23/17 10/23/15 03/08/15 03/07/14 03/04/13 03/02/12 08/28/11 02/27/11  PSA  Total PSA 2.30 ng/mL 2.05  2.36  2.09  2.19  2.36  2.23  2.50     PROCEDURES:         Flexible Cystoscopy - 52000  Risks, benefits, and some of the potential complications of the procedure were discussed with the patient. All questions were answered. Informed consent was obtained. Antibiotic prophylaxis was given -- Cephalexin. Sterile technique and intraurethral analgesia were used.  Meatus:  Normal size. Normal location. Normal condition.  Urethra:  No strictures.  External Sphincter:  Normal.  Verumontanum:  Normal.  Prostate:  Obstructing. No hyperplasia.  Bladder Neck:  Non-obstructing.  Ureteral Orifices:  Normal location. Normal size. Normal shape. Effluxed clear urine.  Bladder:  No trabeculation. No tumors. Normal mucosa. No stones.      The lower urinary tract was carefully examined. The procedure was well-tolerated and without complications. Antibiotic instructions were given. Instructions were given to call the office immediately for bloody urine, difficulty urinating, painful urination, fever, chills, nausea, vomiting or other illness. The patient stated that he understood  these instructions and would comply with them.         Catheter / SP Tube - [id number] Simple Foley Catheterization  A 18 French coude' foley catheter was inserted into the bladder using sterile technique. The patient was taught routine catheter care. A bedside bag was connected.   ASSESSMENT:      ICD-10 Details  1 GU:   BPH w/LUTS - N40.1   2   Urinary Retention - R33.8   3   Bladder Diverticulum - N32.3    PLAN:            Medications New Meds: Cipro 250 mg tablet 1  tablet PO BID start three days before laser prostate procedure  #14  0 Refill(s)            Schedule Return Visit/Planned Activity: Next Available Appointment - Schedule Surgery          Document Letter(s):  Created for Patient: Clinical Summary         Notes:   BPH, urinary retention, bladder diverticulum-he has a difficult problem. He is on maximal medical therapy. We discussed the nature risks benefits and alternatives distally and laser of the prostate possible TURP. We discussed he may fail because of the diverticulum. We discussed risks such as bleeding, stricture, incontinence among others.   Cc: Dr. Benay Spice     * Signed by Festus Aloe, M.D. on 12/11/17 at 5:21 PM (EDT)*     The information contained in this medical record document is considered private and confidential patient information. This information can only be used for the medical diagnosis and/or medical services that are being provided by the patient's selected caregivers. This information can only be distributed outside of the patient's care if the patient agrees and signs waivers of authorization for this information to be sent to an outside source or route.  Addendum: I prescribed Cipro for him to start a few days before surgery.  He saw Bree with a clogged catheter and she send another culture.  This grew providentia and staph sensitive to Cipro.I'll switch his perioperative antibiotics to vancomycin and gentamicin.

## 2018-01-04 NOTE — ED Triage Notes (Signed)
Pt reports that his foley has been blocked since this morning. 500 ML urine in foley bag - states he emptied it this AM last. Emptied in triage. Denies pain. Scheduled for TARP at [facility] tomorrow.

## 2018-01-05 ENCOUNTER — Ambulatory Visit (HOSPITAL_BASED_OUTPATIENT_CLINIC_OR_DEPARTMENT_OTHER): Payer: Medicare Other | Admitting: Anesthesiology

## 2018-01-05 ENCOUNTER — Encounter (HOSPITAL_BASED_OUTPATIENT_CLINIC_OR_DEPARTMENT_OTHER): Payer: Self-pay

## 2018-01-05 ENCOUNTER — Ambulatory Visit (HOSPITAL_BASED_OUTPATIENT_CLINIC_OR_DEPARTMENT_OTHER)
Admission: RE | Admit: 2018-01-05 | Discharge: 2018-01-05 | Disposition: A | Discharge status: Home / Self Care | Payer: Medicare Other | Source: Ambulatory Visit | Attending: Urology | Admitting: Urology

## 2018-01-05 ENCOUNTER — Telehealth: Payer: Self-pay

## 2018-01-05 ENCOUNTER — Encounter (HOSPITAL_BASED_OUTPATIENT_CLINIC_OR_DEPARTMENT_OTHER)
Admission: RE | Disposition: A | Discharge status: Home / Self Care | Payer: Self-pay | Source: Ambulatory Visit | Attending: Urology

## 2018-01-05 DIAGNOSIS — I10 Essential (primary) hypertension: Secondary | ICD-10-CM | POA: Diagnosis not present

## 2018-01-05 DIAGNOSIS — N401 Enlarged prostate with lower urinary tract symptoms: Secondary | ICD-10-CM | POA: Diagnosis not present

## 2018-01-05 DIAGNOSIS — N323 Diverticulum of bladder: Secondary | ICD-10-CM | POA: Insufficient documentation

## 2018-01-05 DIAGNOSIS — R338 Other retention of urine: Secondary | ICD-10-CM | POA: Insufficient documentation

## 2018-01-05 DIAGNOSIS — Z79899 Other long term (current) drug therapy: Secondary | ICD-10-CM | POA: Insufficient documentation

## 2018-01-05 DIAGNOSIS — R3914 Feeling of incomplete bladder emptying: Secondary | ICD-10-CM

## 2018-01-05 DIAGNOSIS — R339 Retention of urine, unspecified: Secondary | ICD-10-CM | POA: Diagnosis not present

## 2018-01-05 HISTORY — DX: Personal history of colonic polyps: Z86.010

## 2018-01-05 HISTORY — DX: Noninfective gastroenteritis and colitis, unspecified: K52.9

## 2018-01-05 HISTORY — DX: Bifascicular block: I45.2

## 2018-01-05 HISTORY — DX: Other specified cataract: H26.8

## 2018-01-05 HISTORY — PX: THULIUM LASER TURP (TRANSURETHRAL RESECTION OF PROSTATE): SHX6744

## 2018-01-05 HISTORY — DX: Venous insufficiency (chronic) (peripheral): I87.2

## 2018-01-05 HISTORY — DX: Other secondary neuroendocrine tumors: C7B.8

## 2018-01-05 HISTORY — DX: Benign prostatic hyperplasia with lower urinary tract symptoms: N40.1

## 2018-01-05 HISTORY — DX: Other obstructive and reflux uropathy: N13.8

## 2018-01-05 HISTORY — DX: Presence of other specified devices: Z97.8

## 2018-01-05 HISTORY — DX: Male erectile dysfunction, unspecified: N52.9

## 2018-01-05 HISTORY — DX: Unspecified glaucoma: H40.9

## 2018-01-05 HISTORY — DX: Presence of urogenital implants: Z96.0

## 2018-01-05 HISTORY — DX: Iron deficiency anemia, unspecified: D50.9

## 2018-01-05 HISTORY — DX: Carcinoid syndrome: E34.0

## 2018-01-05 HISTORY — DX: Personal history of other specified conditions: Z87.898

## 2018-01-05 HISTORY — DX: Elevated prostate specific antigen (PSA): R97.20

## 2018-01-05 HISTORY — DX: Diarrhea, unspecified: R19.7

## 2018-01-05 HISTORY — DX: Retention of urine, unspecified: R33.9

## 2018-01-05 LAB — POCT I-STAT, CHEM 8
BUN: 11 mg/dL (ref 6–20)
CALCIUM ION: 1.18 mmol/L (ref 1.15–1.40)
CHLORIDE: 101 mmol/L (ref 101–111)
Creatinine, Ser: 0.8 mg/dL (ref 0.61–1.24)
GLUCOSE: 113 mg/dL — AB (ref 65–99)
HCT: 42 % (ref 39.0–52.0)
Hemoglobin: 14.3 g/dL (ref 13.0–17.0)
Potassium: 3.9 mmol/L (ref 3.5–5.1)
Sodium: 141 mmol/L (ref 135–145)
TCO2: 23 mmol/L (ref 22–32)

## 2018-01-05 LAB — URINALYSIS, ROUTINE W REFLEX MICROSCOPIC
BILIRUBIN URINE: NEGATIVE
Glucose, UA: NEGATIVE mg/dL
KETONES UR: NEGATIVE mg/dL
Nitrite: NEGATIVE
Protein, ur: NEGATIVE mg/dL
Specific Gravity, Urine: 1.016 (ref 1.005–1.030)
pH: 6 (ref 5.0–8.0)

## 2018-01-05 SURGERY — THULIUM LASER TURP (TRANSURETHRAL RESECTION OF PROSTATE)
Anesthesia: General

## 2018-01-05 MED ORDER — SULFAMETHOXAZOLE-TRIMETHOPRIM 800-160 MG PO TABS: 1.0000 | ORAL_TABLET | Freq: Every day | ORAL | 0 refills | Status: DC

## 2018-01-05 MED ORDER — ACETAMINOPHEN 500 MG PO TABS
1000.0000 mg | ORAL_TABLET | Freq: Once | ORAL | Status: DC
  Filled 2018-01-05: qty 2

## 2018-01-05 MED ORDER — CELECOXIB 200 MG PO CAPS
ORAL_CAPSULE | ORAL | Status: AC
  Filled 2018-01-05: qty 1

## 2018-01-05 MED ORDER — SODIUM CHLORIDE 0.9 % IV SOLN
INTRAVENOUS | Status: DC
  Administered 2018-01-05 (×2): via INTRAVENOUS
  Filled 2018-01-05: qty 1000

## 2018-01-05 MED ORDER — FENTANYL CITRATE (PF) 100 MCG/2ML IJ SOLN
INTRAMUSCULAR | Status: AC
  Filled 2018-01-05: qty 2

## 2018-01-05 MED ORDER — DEXAMETHASONE SODIUM PHOSPHATE 10 MG/ML IJ SOLN
INTRAMUSCULAR | Status: AC
Start: 2018-01-05 — End: ?
  Filled 2018-01-05: qty 1

## 2018-01-05 MED ORDER — ONDANSETRON HCL 4 MG/2ML IJ SOLN
INTRAMUSCULAR | Status: AC
  Filled 2018-01-05: qty 2

## 2018-01-05 MED ORDER — PROPOFOL 10 MG/ML IV BOLUS
INTRAVENOUS | Status: AC
  Filled 2018-01-05: qty 20

## 2018-01-05 MED ORDER — FENTANYL CITRATE (PF) 100 MCG/2ML IJ SOLN
INTRAMUSCULAR | Status: DC | PRN
  Administered 2018-01-05: 50 ug via INTRAVENOUS
  Administered 2018-01-05: 25 ug via INTRAVENOUS
  Administered 2018-01-05 (×2): 50 ug via INTRAVENOUS

## 2018-01-05 MED ORDER — PROPOFOL 10 MG/ML IV BOLUS
INTRAVENOUS | Status: DC | PRN
  Administered 2018-01-05: 50 mg via INTRAVENOUS
  Administered 2018-01-05: 200 mg via INTRAVENOUS

## 2018-01-05 MED ORDER — VANCOMYCIN HCL IN DEXTROSE 1-5 GM/200ML-% IV SOLN
1000.0000 mg | INTRAVENOUS | Status: AC
  Administered 2018-01-05: 1000 mg via INTRAVENOUS
  Filled 2018-01-05: qty 200

## 2018-01-05 MED ORDER — KETOROLAC TROMETHAMINE 30 MG/ML IJ SOLN
INTRAMUSCULAR | Status: AC
  Filled 2018-01-05: qty 1

## 2018-01-05 MED ORDER — LIDOCAINE HCL (CARDIAC) 20 MG/ML IV SOLN
INTRAVENOUS | Status: DC | PRN
  Administered 2018-01-05: 60 mg via INTRAVENOUS

## 2018-01-05 MED ORDER — ACETAMINOPHEN 500 MG PO TABS
1000.0000 mg | ORAL_TABLET | Freq: Once | ORAL | Status: AC
  Administered 2018-01-05: 1000 mg via ORAL
  Filled 2018-01-05: qty 2

## 2018-01-05 MED ORDER — GENTAMICIN SULFATE 40 MG/ML IJ SOLN
5.0000 mg/kg | INTRAVENOUS | Status: AC
  Administered 2018-01-05: 490 mg via INTRAVENOUS
  Filled 2018-01-05 (×2): qty 12.25

## 2018-01-05 MED ORDER — LIDOCAINE 2% (20 MG/ML) 5 ML SYRINGE
INTRAMUSCULAR | Status: AC
  Filled 2018-01-05: qty 5

## 2018-01-05 MED ORDER — CELECOXIB 200 MG PO CAPS
200.0000 mg | ORAL_CAPSULE | Freq: Once | ORAL | Status: DC
  Filled 2018-01-05: qty 1

## 2018-01-05 MED ORDER — PHENYLEPHRINE HCL 10 MG/ML IJ SOLN
INTRAMUSCULAR | Status: AC
  Filled 2018-01-05: qty 1

## 2018-01-05 MED ORDER — MEPERIDINE HCL 25 MG/ML IJ SOLN
6.2500 mg | INTRAMUSCULAR | Status: DC | PRN
  Filled 2018-01-05: qty 1

## 2018-01-05 MED ORDER — DEXAMETHASONE SODIUM PHOSPHATE 10 MG/ML IJ SOLN
INTRAMUSCULAR | Status: AC
  Filled 2018-01-05: qty 1

## 2018-01-05 MED ORDER — VANCOMYCIN HCL 1000 MG IV SOLR
INTRAVENOUS | Status: DC | PRN
  Administered 2018-01-05: 1000 mg via INTRAVENOUS

## 2018-01-05 MED ORDER — ONDANSETRON HCL 4 MG/2ML IJ SOLN
INTRAMUSCULAR | Status: DC | PRN
  Administered 2018-01-05: 4 mg via INTRAVENOUS

## 2018-01-05 MED ORDER — FENTANYL CITRATE (PF) 100 MCG/2ML IJ SOLN
25.0000 ug | INTRAMUSCULAR | Status: DC | PRN
  Filled 2018-01-05: qty 1

## 2018-01-05 MED ORDER — CELECOXIB 200 MG PO CAPS
200.0000 mg | ORAL_CAPSULE | Freq: Once | ORAL | Status: AC
  Administered 2018-01-05: 200 mg via ORAL
  Filled 2018-01-05: qty 1

## 2018-01-05 MED ORDER — VANCOMYCIN HCL IN DEXTROSE 1-5 GM/200ML-% IV SOLN
INTRAVENOUS | Status: AC
  Filled 2018-01-05: qty 200

## 2018-01-05 MED ORDER — ACETAMINOPHEN 500 MG PO TABS
ORAL_TABLET | ORAL | Status: AC
  Filled 2018-01-05: qty 2

## 2018-01-05 MED ORDER — DEXAMETHASONE SODIUM PHOSPHATE 4 MG/ML IJ SOLN
INTRAMUSCULAR | Status: DC | PRN
  Administered 2018-01-05: 10 mg via INTRAVENOUS

## 2018-01-05 SURGICAL SUPPLY — 19 items
BAG DRAIN URO-CYSTO SKYTR STRL (DRAIN) ×3 IMPLANT
BAG DRN UROCATH (DRAIN) ×1
BAG URINE DRAINAGE (UROLOGICAL SUPPLIES) ×3 IMPLANT
CATH COUDE FOLEY 2W 5CC 18FR (CATHETERS) ×2 IMPLANT
CATH FOLEY 3WAY 30CC 22F (CATHETERS) IMPLANT
CLOTH BEACON ORANGE TIMEOUT ST (SAFETY) ×3 IMPLANT
GLOVE BIO SURGEON STRL SZ7.5 (GLOVE) ×3 IMPLANT
GOWN STRL REUS W/TWL XL LVL3 (GOWN DISPOSABLE) ×3 IMPLANT
HOLDER FOLEY CATH W/STRAP (MISCELLANEOUS) ×2 IMPLANT
IV NS IRRIG 3000ML ARTHROMATIC (IV SOLUTION) ×19 IMPLANT
KIT TURNOVER CYSTO (KITS) ×3 IMPLANT
LASER REVOLIX PROCEDURE (MISCELLANEOUS) ×3 IMPLANT
LOOP CUT BIPOLAR 24F LRG (ELECTROSURGICAL) IMPLANT
MANIFOLD NEPTUNE II (INSTRUMENTS) ×3 IMPLANT
PACK CYSTO (CUSTOM PROCEDURE TRAY) ×3 IMPLANT
SYR 30ML LL (SYRINGE) IMPLANT
TUBE CONNECTING 12'X1/4 (SUCTIONS) ×1
TUBE CONNECTING 12X1/4 (SUCTIONS) ×1 IMPLANT
WATER STERILE IRR 500ML POUR (IV SOLUTION) ×2 IMPLANT

## 2018-01-05 NOTE — Anesthesia Procedure Notes (Signed)
Procedure Name: LMA Insertion Date/Time: 01/05/2018 11:31 AM Performed by: Wanita Chamberlain, CRNA Pre-anesthesia Checklist: Patient identified, Timeout performed, Suction available, Patient being monitored and Emergency Drugs available Patient Re-evaluated:Patient Re-evaluated prior to induction Oxygen Delivery Method: Circle system utilized Preoxygenation: Pre-oxygenation with 100% oxygen Induction Type: IV induction Ventilation: Mask ventilation without difficulty LMA: LMA inserted LMA Size: 5.0 Number of attempts: 1 Placement Confirmation: breath sounds checked- equal and bilateral,  CO2 detector and positive ETCO2 Tube secured with: Tape Dental Injury: Teeth and Oropharynx as per pre-operative assessment

## 2018-01-05 NOTE — Op Note (Signed)
Preoperative diagnosis: BPH, urinary retention Postoperative diagnosis: Same  Procedure: Thulium laser vaporization of the prostate  Surgeon: Junious Silk  Anesthesia: General  Indication for procedure: 77 year old African-American male with symptomatic BPH and urinary retention.  Obstructing median and lateral lobes on cystoscopy.  Findings: On cystoscopy the urethra was normal, the prostatic urethra was obstructed primarily from the median lobe, but the lateral lobes fell into view as the median lobe was vaporized.  There was a high bladder neck.  There were stones in the bladder likely from the catheter.  These were fragmented and drained.  Complications: None  Estimated blood loss: 50 mL  Specimens: None  Drains: 18 French coud catheter  Disposition: Patient stable to PACU  Description of procedure: After consent was obtained patient brought to the operating room.  After adequate anesthesia was placed in lithotomy position and prepped and draped in usual sterile fashion.  A timeout was performed to confirm the patient and procedure.  The cystoscope was passed per urethra and the prostate and bladder inspected.  I then passed a continuous flow laser sheath with the scope and identified the ureteral orifice ease. I used the laser to fragement the large piece of stone which was like a flat disc about 15 mm. I then went in a line from the right ureteral orifice and began to vaporize at the bladder neck and incised this down through to the prostate tissue and brought this toward the apex.  I then did a similar incision and vaporization on the left side along the left ureteral orifice and down.  This outlined the median lobe.  The left lateral lobe dropped in the view and much of this was vaporized from the bladder neck anterior to posterior down toward the veru.  I then went back to the median lobe came across this and vaporized it down to my bladder neck incisions.  This really open things up.  I  went down to the veru and made an incision in the right apical lobe in a hockey stick fashion and then vaporized the right lateral lobe from the bladder neck back down and connected these incisions.  This created an excellent channel.  Under low pressure there was remaining tissue along the median lobe, floor and the left lateral and this was vaporized.  Hemostasis was excellent at low pressure.  Debris (tissue, stone and a small clot) was evacuated.  Again, the ureteral orifices were checked and looked normal, bladder and trigone normal, the bladder neck and prostatic urethra widely patent.  Hemostasis was excellent.  There was some apical tissue as is typical down near the veru but I left this in place and did not vaporize any further distally.  Again, the area was checked under low pressure and noted to have excellent hemostasis.  The bladder was filled and the scope removed.  I placed an 21 [place] coud catheter and left 18 mL in the balloon and seated this at the bladder neck.  He was awakened and taken to the recovery room in stable condition.

## 2018-01-05 NOTE — Interval H&P Note (Signed)
History and Physical Interval Note:  01/05/2018 10:54 AM  Charles Schmidt  has presented today for surgery, with the diagnosis of BENIGN PROSTATIC HYPERPLASIA, RETENTION  The various methods of treatment have been discussed with the patient and family. After consideration of risks, benefits and other options for treatment, the patient has consented to  Procedure(s): THULIUM LASER OF THE PROSTATE/ POSSIBLE TURP (TRANSURETHRAL RESECTION OF PROSTATE) (N/A) as a surgical intervention .  The patient's history has been reviewed, patient examined, no change in status, stable for surgery. No fever or hematuria. Catheter clogs easily, but flows when he walks around. Discussed possible CBI/admission. I have reviewed the patient's chart and labs.  Questions were answered to the patient's satisfaction.     Festus Aloe

## 2018-01-05 NOTE — Telephone Encounter (Addendum)
Call from pt daughter. Daughter states the injection he is getting for diarrhea isn't helping and wants to know what the treatment is called. Unable to reach daughter

## 2018-01-05 NOTE — Progress Notes (Signed)
Urine leak twice around catheter while moving to Phase 2 for discharge. Cleaned and mesh briefs and pad applied.

## 2018-01-05 NOTE — Transfer of Care (Signed)
Immediate Anesthesia Transfer of Care Note  Patient: Charles Schmidt  Procedure(s) Performed: Marcelino Duster LASER OF THE PROSTATE (N/A )  Patient Location: PACU  Anesthesia Type:General  Level of Consciousness: awake, alert , oriented and patient cooperative  Airway & Oxygen Therapy: Patient Spontanous Breathing and Patient connected to nasal cannula oxygen  Post-op Assessment: Report given to RN and Post -op Vital signs reviewed and stable  Post vital signs: Reviewed and stable  Last Vitals:  Vitals Value Taken Time  BP    Temp    Pulse    Resp    SpO2      Last Pain:  Vitals:   01/05/18 0932  TempSrc:   PainSc: 0-No pain         Complications: No apparent anesthesia complications

## 2018-01-05 NOTE — Discharge Instructions (Signed)
Indwelling Urinary Catheter Care, Adult Take good care of your catheter to keep it working and to prevent problems. How to wear your catheter Attach your catheter to your leg with tape (adhesive tape) or a leg strap. Make sure it is not too tight. If you use tape, remove any bits of tape that are already on the catheter. How to wear a drainage bag You should have:  A large overnight bag.  A small leg bag.  Overnight Bag You may wear the overnight bag at any time. Always keep the bag below the level of your bladder but off the floor. When you sleep, put a clean plastic bag in a wastebasket. Then hang the bag inside the wastebasket. Leg Bag Never wear the leg bag at night. Always wear the leg bag below your knee. Keep the leg bag secure with a leg strap or tape. How to care for your skin  Clean the skin around the catheter at least once every day.  Shower every day. Do not take baths.  Put creams, lotions, or ointments on your genital area only as told by your doctor.  Do not use powders, sprays, or lotions on your genital area. How to clean your catheter and your skin 1. Wash your hands with soap and water. 2. Wet a washcloth in warm water and gentle (mild) soap. 3. Use the washcloth to clean the skin where the catheter enters your body. Clean downward and wipe away from the catheter in small circles. Do not wipe toward the catheter. 4. Pat the area dry with a clean towel. Make sure to clean off all soap. How to care for your drainage bags Empty your drainage bag when it is ?- full or at least 2-3 times a day. Replace your drainage bag once a month or sooner if it starts to smell bad or look dirty. Do not clean your drainage bag unless told by your doctor. Emptying a drainage bag  Supplies Needed  Rubbing alcohol.  Gauze pad or cotton ball.  Tape or a leg strap.  Steps 1. Wash your hands with soap and water. 2. Separate (detach) the bag from your leg. 3. Hold the bag over  the toilet or a clean container. Keep the bag below your hips and bladder. This stops pee (urine) from going back into the tube. 4. Open the pour spout at the bottom of the bag. 5. Empty the pee into the toilet or container. Do not let the pour spout touch any surface. 6. Put rubbing alcohol on a gauze pad or cotton ball. 7. Use the gauze pad or cotton ball to clean the pour spout. 8. Close the pour spout. 9. Attach the bag to your leg with tape or a leg strap. 10. Wash your hands.  Changing a drainage bag Supplies Needed  Alcohol wipes.  A clean drainage bag.  Adhesive tape or a leg strap.  Steps 1. Wash your hands with soap and water. 2. Separate the dirty bag from your leg. 3. Pinch the rubber catheter with your fingers so that pee does not spill out. 4. Separate the catheter tube from the drainage tube where these tubes connect (at the connection valve). Do not let the tubes touch any surface. 5. Clean the end of the catheter tube with an alcohol wipe. Use a different alcohol wipe to clean the end of the drainage tube. 6. Connect the catheter tube to the drainage tube of the clean bag. 7. Attach the new bag to  the leg with adhesive tape or a leg strap. 8. Wash your hands.  How to prevent infection and other problems  Never pull on your catheter or try to remove it. Pulling can damage tissue in your body.  Always wash your hands before and after touching your catheter.  If a leg strap gets wet, replace it with a dry one.  Drink enough fluids to keep your pee clear or pale yellow, or as told by your doctor.  Do not let the drainage bag or tubing touch the floor.  Wear cotton underwear.  If you are male, wipe from front to back after you poop (have a bowel movement).  Check on the catheter often to make sure it works and the tubing is not twisted. Get help if:  Your pee is cloudy.  Your pee smells unusually bad.  Your pee is not draining into the bag.  Your  tube gets clogged.  Your catheter starts to leak.  Your bladder feels full. Get help right away if:  You have redness, swelling, or pain where the catheter enters your body.  You have fluid, pus, or a bad smell coming from the area where the catheter enters your body.  The area where the catheter enters your body feels warm.  You have a fever.  You have pain in your: ? Stomach (abdomen). ? Legs. ? Lower back. ? Bladder.  You see blood fill the catheter.  Your pee is pink or red.  You feel sick to your stomach (nauseous).  You throw up (vomit).  You have chills.  Your catheter gets pulled out. This information is not intended to replace advice given to you by your health care provider. Make sure you discuss any questions you have with your health care provider. Document Released: 01/03/2013 Document Revised: 08/06/2016 Document Reviewed: 02/21/2014 Elsevier Interactive Patient Education  2018 [facility], Care After This sheet gives you information about how to care for yourself after your procedure. Your health care provider may also give you more specific instructions. If you have problems or questions, contact your health care provider. What can I expect after the procedure? For the first few weeks after the procedure:  You will feel a need to urinate often.  You may have blood in your urine.  You may feel a sudden need to urinate.  Once your urinary catheter is removed, you may have a burning feeling when you urinate, especially at the end of urination. This feeling usually passes within 3-5 days. Follow these instructions at home: Activity  Return to your normal activities as told by your health care provider. Ask your health care provider what activities are safe for you.  Do not do vigorous exercise for 1 week or as told by your health care provider.  Do not lift anything that is heavier than 10 lb (4.5 kg) until your health  care provider say it is safe.  Avoid sexual activity for 4-6 weeks or as told by your health care provider.  Do not ride in a car for extended periods of time for 1 month or as told by your health care provider.  Do not drive for 24 hours if you were given a medicine to help you relax (sedative). Diet  Eat foods that are high in fiber, such as fresh fruits and vegetables, whole grains, and beans.  Drink enough fluid to keep your urine clear or pale yellow. Medicines  Take over-the-counter and prescription medicines,  including stool softeners, only as told by your health care provider.  If you were prescribed an antibiotic medicine, take it as told by your health care provider. Do not stop taking the antibiotic even if you start to feel better. General instructions  If you were given elastic support stockings, wear them as told by your health care provider.  Do not strain to have a bowel movement. Straining may lead to bleeding from the prostate and cause clots to form and cause trouble urinating.  Keep all follow-up visits as told by your health care provider. This is important. Contact a health care provider if:  You have a fever or chills.  You have spasms or pain with the urinary catheter still in place.  Once the catheter has been removed, you experience difficulty starting your stream when attempting to urinate. Get help right away if:  There is a blockage in your catheter.  Your catheter has been removed and you are suddenly unable to urinate.  Your urine smells unusually bad.  You start to have blood clots in your urine.  The blood in your urine becomes persistent or gets thick.  You develop chest pains.  You develop shortness of breath.  You develop swelling or pain in your leg. Summary  You may notice urinary symptoms for a few weeks after your procedure.  Follow instructions from your health care provider regarding activity restrictions such as lifting,  exercise, and sexual activity.  Contact your health care provider if you have any unusual symptoms during your recovery. This information is not intended to replace advice given to you by your health care provider. Make sure you discuss any questions you have with your health care provider. Document Released: 09/08/2005 Document Revised: 04/25/2016 Document Reviewed: 04/25/2016 Elsevier Interactive Patient Education  2018 [facility] Instructions  Activity: Get plenty of rest for the remainder of the day. A responsible individual must stay with you for 24 hours following the procedure.  For the next 24 hours, DO NOT: -Drive a car -Paediatric nurse -Drink alcoholic beverages -Take any medication unless instructed by your physician -Make any legal decisions or sign important papers.  Meals: Start with liquid foods such as gelatin or soup. Progress to regular foods as tolerated. Avoid greasy, spicy, heavy foods. If nausea and/or vomiting occur, drink only clear liquids until the nausea and/or vomiting subsides. Call your physician if vomiting continues.  Special Instructions/Symptoms: Your throat may feel dry or sore from the anesthesia or the breathing tube placed in your throat during surgery. If this causes discomfort, gargle with warm salt water. The discomfort should disappear within 24 hours.

## 2018-01-05 NOTE — Anesthesia Preprocedure Evaluation (Signed)
Anesthesia Evaluation  Patient identified by MRN, date of birth, ID band Patient awake    Reviewed: Allergy & Precautions, H&P , NPO status , Patient's Chart, lab work & pertinent test results, reviewed documented beta blocker date and time   Airway Mallampati: II  TM Distance: >3 FB Neck ROM: full    Dental no notable dental hx.    Pulmonary neg pulmonary ROS,    Pulmonary exam normal breath sounds clear to auscultation       Cardiovascular Exercise Tolerance: Good hypertension, Pt. on medications negative cardio ROS Normal cardiovascular exam Rhythm:regular Rate:Normal  EKG 1/19 RBBB (right bundle branch block with left anterior fascicular block)   Neuro/Psych negative neurological ROS  negative psych ROS   GI/Hepatic negative GI ROS,  CT showed bph, large ,median lobe. He was found to have metastatic neuroendocrine tumor. 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 He is scheduled to begin Sandostatin therapy for further management.    Endo/Other  negative endocrine ROS  Renal/GU negative Renal ROS  negative genitourinary   Musculoskeletal   Abdominal   Peds  Hematology  (+) anemia ,   Anesthesia Other Findings   Reproductive/Obstetrics negative OB ROS                             Anesthesia Physical Anesthesia Plan  ASA: III  Anesthesia Plan: General   Post-op Pain Management:    Induction: Intravenous  PONV Risk Score and Plan: 2 and Ondansetron and Treatment may vary due to age or medical condition  Airway Management Planned: Oral ETT and LMA  Additional Equipment:   Intra-op Plan:   Post-operative Plan: Extubation in OR  Informed Consent: I have reviewed the patients History and Physical, chart, labs and discussed the procedure including the risks, benefits and alternatives for the proposed anesthesia with  the patient or authorized representative who has indicated his/her understanding and acceptance.   Dental Advisory Given  Plan Discussed with: CRNA, Anesthesiologist and Surgeon  Anesthesia Plan Comments: (  )        Anesthesia Quick Evaluation

## 2018-01-05 NOTE — ED Notes (Signed)
Pt. Stated he needed rest before his surgery in the morning and would be leaving.

## 2018-01-06 ENCOUNTER — Encounter (HOSPITAL_BASED_OUTPATIENT_CLINIC_OR_DEPARTMENT_OTHER): Payer: Self-pay | Admitting: Urology

## 2018-01-06 ENCOUNTER — Other Ambulatory Visit: Payer: Self-pay | Admitting: Nurse Practitioner

## 2018-01-06 DIAGNOSIS — C7B8 Other secondary neuroendocrine tumors: Secondary | ICD-10-CM

## 2018-01-06 NOTE — Anesthesia Postprocedure Evaluation (Signed)
Anesthesia Post Note  Patient: Charles Schmidt  Procedure(s) Performed: Marcelino Duster LASER OF THE PROSTATE (N/A )     Patient location during evaluation: PACU Anesthesia Type: General Level of consciousness: awake and alert Pain management: pain level controlled Vital Signs Assessment: post-procedure vital signs reviewed and stable Respiratory status: spontaneous breathing, nonlabored ventilation, respiratory function stable and patient connected to nasal cannula oxygen Cardiovascular status: blood pressure returned to baseline and stable Postop Assessment: no apparent nausea or vomiting Anesthetic complications: no    Last Vitals:  Vitals:   01/05/18 1400 01/05/18 1529  BP: (!) 146/98 140/82  Pulse: 66 66  Resp: 15 16  Temp:  36.4 C  SpO2: 94% 97%    Last Pain:  Vitals:   01/05/18 1529  TempSrc: Oral  PainSc: 0-No pain                 Marvyn Torrez

## 2018-01-06 NOTE — Telephone Encounter (Signed)
Unable to reach daughter regarding message below

## 2018-01-08 ENCOUNTER — Telehealth: Payer: Self-pay

## 2018-01-08 DIAGNOSIS — N401 Enlarged prostate with lower urinary tract symptoms: Secondary | ICD-10-CM | POA: Diagnosis not present

## 2018-01-08 DIAGNOSIS — R338 Other retention of urine: Secondary | ICD-10-CM | POA: Diagnosis not present

## 2018-01-08 DIAGNOSIS — N323 Diverticulum of bladder: Secondary | ICD-10-CM | POA: Diagnosis not present

## 2018-01-08 DIAGNOSIS — C7A8 Other malignant neuroendocrine tumors: Secondary | ICD-10-CM | POA: Diagnosis not present

## 2018-01-08 DIAGNOSIS — E340 Carcinoid syndrome: Secondary | ICD-10-CM | POA: Diagnosis not present

## 2018-01-08 DIAGNOSIS — Z466 Encounter for fitting and adjustment of urinary device: Secondary | ICD-10-CM | POA: Diagnosis not present

## 2018-01-08 NOTE — Telephone Encounter (Signed)
Spoke with pt daughter. Daughter states her father says "the injection for diarrhea isn't helping as much as before". Daughter notes that she will follow up with father to see how his diarrhea has been because she "didn;t notice that he had any when I was with him the past few days". Informed daughter that the treatment he receives is called Sandostatin. Daughter questioned if the Sandostatin doesn't work, what is the next step? Also questions the use for Sandostatin versus Lutathera use. Will consult MD. Daughter voiced understanding.

## 2018-01-08 NOTE — Telephone Encounter (Signed)
We can increase the Sandostatin dose with the next injection if it is not working He should be scheduled with Dr. Amalia Hailey to discuss Lutathera

## 2018-01-12 DIAGNOSIS — E340 Carcinoid syndrome: Secondary | ICD-10-CM | POA: Diagnosis not present

## 2018-01-12 DIAGNOSIS — Z466 Encounter for fitting and adjustment of urinary device: Secondary | ICD-10-CM | POA: Diagnosis not present

## 2018-01-12 DIAGNOSIS — C7A8 Other malignant neuroendocrine tumors: Secondary | ICD-10-CM | POA: Diagnosis not present

## 2018-01-12 DIAGNOSIS — R338 Other retention of urine: Secondary | ICD-10-CM | POA: Diagnosis not present

## 2018-01-12 DIAGNOSIS — N323 Diverticulum of bladder: Secondary | ICD-10-CM | POA: Diagnosis not present

## 2018-01-12 DIAGNOSIS — N401 Enlarged prostate with lower urinary tract symptoms: Secondary | ICD-10-CM | POA: Diagnosis not present

## 2018-01-14 DIAGNOSIS — C7A8 Other malignant neuroendocrine tumors: Secondary | ICD-10-CM | POA: Diagnosis not present

## 2018-01-14 DIAGNOSIS — R338 Other retention of urine: Secondary | ICD-10-CM | POA: Diagnosis not present

## 2018-01-14 DIAGNOSIS — N401 Enlarged prostate with lower urinary tract symptoms: Secondary | ICD-10-CM | POA: Diagnosis not present

## 2018-01-14 DIAGNOSIS — N323 Diverticulum of bladder: Secondary | ICD-10-CM | POA: Diagnosis not present

## 2018-01-14 DIAGNOSIS — E340 Carcinoid syndrome: Secondary | ICD-10-CM | POA: Diagnosis not present

## 2018-01-14 DIAGNOSIS — Z466 Encounter for fitting and adjustment of urinary device: Secondary | ICD-10-CM | POA: Diagnosis not present

## 2018-01-20 ENCOUNTER — Emergency Department (HOSPITAL_COMMUNITY)
Admission: EM | Admit: 2018-01-20 | Discharge: 2018-01-20 | Disposition: A | Discharge status: Home / Self Care | Payer: Medicare Other | Attending: Emergency Medicine | Admitting: Emergency Medicine

## 2018-01-20 ENCOUNTER — Encounter (HOSPITAL_COMMUNITY): Payer: Self-pay

## 2018-01-20 ENCOUNTER — Other Ambulatory Visit: Payer: Self-pay

## 2018-01-20 DIAGNOSIS — N401 Enlarged prostate with lower urinary tract symptoms: Secondary | ICD-10-CM | POA: Insufficient documentation

## 2018-01-20 DIAGNOSIS — R339 Retention of urine, unspecified: Secondary | ICD-10-CM | POA: Insufficient documentation

## 2018-01-20 DIAGNOSIS — Z8505 Personal history of malignant neoplasm of liver: Secondary | ICD-10-CM | POA: Diagnosis not present

## 2018-01-20 DIAGNOSIS — E340 Carcinoid syndrome: Secondary | ICD-10-CM | POA: Diagnosis not present

## 2018-01-20 DIAGNOSIS — I10 Essential (primary) hypertension: Secondary | ICD-10-CM | POA: Diagnosis not present

## 2018-01-20 DIAGNOSIS — Z466 Encounter for fitting and adjustment of urinary device: Secondary | ICD-10-CM | POA: Diagnosis not present

## 2018-01-20 DIAGNOSIS — N323 Diverticulum of bladder: Secondary | ICD-10-CM | POA: Diagnosis not present

## 2018-01-20 DIAGNOSIS — Z79899 Other long term (current) drug therapy: Secondary | ICD-10-CM | POA: Insufficient documentation

## 2018-01-20 DIAGNOSIS — R338 Other retention of urine: Secondary | ICD-10-CM | POA: Diagnosis not present

## 2018-01-20 DIAGNOSIS — C7A8 Other malignant neuroendocrine tumors: Secondary | ICD-10-CM | POA: Diagnosis not present

## 2018-01-20 LAB — URINALYSIS, ROUTINE W REFLEX MICROSCOPIC
Bilirubin Urine: NEGATIVE
Glucose, UA: NEGATIVE mg/dL
Ketones, ur: NEGATIVE mg/dL
Nitrite: NEGATIVE
PROTEIN: 30 mg/dL — AB
SPECIFIC GRAVITY, URINE: 1.014 (ref 1.005–1.030)
pH: 6 (ref 5.0–8.0)

## 2018-01-20 NOTE — ED Triage Notes (Signed)
Pt had catheter removed yesterday AM @ 0800 and has not been able to urinate since. Pt states "I need a catheter put in I'm about to die".

## 2018-01-20 NOTE — ED Provider Notes (Signed)
[facility] EMERGENCY DEPARTMENT Provider Note   CSN: [id number] Arrival date & time: 01/20/18  0355     History   Chief Complaint Chief Complaint  Patient presents with  . Urinary Retention    HPI Charles Schmidt is a 77 y.o. male.  The history is provided by the patient.  He has history of BPH, carcinoid tumor with carcinoid syndrome, hypertension, prediabetes and comes in with urinary retention.  He had transurethral resection of his prostate on April 16 and had Foley catheter removed by his urologist yesterday morning.  Since the catheter was removed, he has only been able to urinate very small amounts.  Past Medical History:  Diagnosis Date  . BPH with obstruction/lower urinary tract symptoms   . Cataract, mature    12-28-2017  per pt right eye, legally blind  . Diarrhea concurrent with and due to carcinoid syndrome ([place])    12-28-2017 improvement since started Sandostatin monthly  . ED (erectile dysfunction)   . Elevated PSA   . Family history of cancer in son   . Family history of prostate cancer   . Foley catheter in place   . Glaucoma, left eye   . History of colon polyps   . History of urinary retention    2015; 2016; 01/ 2019  . Hypertension   . Metastatic malignant neuroendocrine tumor to liver [facility]) oncoloigst-  dr Benay Spice   dx 10-23-2017 by liver bx-- WHO grade 2; small bowel mesenteric mass, cirrhosis, mets left lower lobe solitary nodule ,  periaortic and paraspinal lymph nodes METS--  started monthly Sandostatin 11-20-2017  . Microcytic anemia   . RBBB (right bundle branch block with left anterior fascicular block)   . Urinary retention   . Venous reflux     Patient Active Problem List   Diagnosis Date Noted  . Genetic testing 12/18/2017  . Family history of cancer in son   . Family history of prostate cancer   . Metastatic malignant neuroendocrine tumor to liver ([place]) 10/28/2017  . Essential hypertension (primary) 05/04/2017  .  Hypokalemia 05/04/2017  . Benign prostatic hyperplasia 05/04/2017  . Glaucoma 05/04/2017  . Benign hypertensive heart disease without heart failure 05/04/2017  . Microcytic anemia 05/04/2017  . Prediabetes 05/04/2017  . Hypocalcemia 05/04/2017  . Psoriasis 05/04/2017    Past Surgical History:  Procedure Laterality Date  . INGUINAL HERNIA REPAIR  1990s   unsure side  . IR US GUIDE BX ASP/DRAIN  10/23/2017  . QUADRICEPS TENDON REPAIR Left 01-08-2011   dr Veverly Fells  . THULIUM LASER TURP (TRANSURETHRAL RESECTION OF PROSTATE) N/A 01/05/2018   Procedure: Marcelino Duster LASER OF THE PROSTATE;  Surgeon: Festus Aloe, MD;  [facility]: [facility];  Service: Urology;  Laterality: N/A;  . TONSILLECTOMY  child        Home Medications    Prior to Admission medications   Medication Sig Start Date End Date Taking? Authorizing Provider  amLODipine (NORVASC) 5 MG tablet Take 5 mg by mouth every evening.     [provider]  dorzolamide-timolol (COSOPT) 22.3-6.8 MG/ML ophthalmic solution Place 1 drop into the left eye 2 (two) times daily.     [provider]  finasteride (PROSCAR) 5 MG tablet Take 5 mg by mouth every evening.     [provider]  hydrochlorothiazide (HYDRODIURIL) 25 MG tablet Take 25 mg by mouth every evening.     [provider]  Multiple Vitamins-Minerals (CENTRUM SILVER PO) Take by mouth daily.  [provider]  potassium chloride (K-DUR) 10 MEQ tablet Take 10 mEq by mouth every evening.  11/19/17   [provider]  Saw Palmetto, Serenoa repens, (SAW PALMETTO PO) Take by mouth daily.    [provider]  sulfamethoxazole-trimethoprim (BACTRIM DS,SEPTRA DS) 800-160 MG tablet Take 1 tablet by mouth at bedtime. 01/05/18   Festus Aloe, MD  tamsulosin (FLOMAX) 0.4 MG CAPS capsule Take 1 capsule (0.4 mg total) by mouth daily after supper. Patient taking differently: Take 0.4 mg by mouth daily after supper.   10/22/14   [name], MD  Travoprost, BAK Free, (TRAVATAN) 0.004 % SOLN ophthalmic solution Place 1 drop into the left eye at bedtime.     [provider]    Family History Family History  Problem Relation Age of Onset  . Hypertension Mother   . Stroke Father   . Prostate cancer Father        'slow growing'  . Kidney Stones Sister   . Cancer Son 15       neuroendocrine tumors of small intestine    Social History Social History   Tobacco Use  . Smoking status: Never Smoker  . Smokeless tobacco: Never Used  Substance Use Topics  . Alcohol use: Not Currently    Alcohol/week: 0.6 oz    Types: 1 Standard drinks or equivalent per week    Comment: week  . Drug use: No     Allergies   Patient has no known allergies.   Review of Systems Review of Systems  All other systems reviewed and are negative.    Physical Exam Updated Vital Signs BP (!) 165/106 (BP Location: Right Arm)   Pulse (!) 101   Temp (!) 97.5 F (36.4 C) (Oral)   Resp 20   Ht 6\' 5"  (1.956 m)   Wt 97.1 kg (214 lb)   SpO2 100%   BMI 25.38 kg/m   Physical Exam  Nursing note and vitals reviewed.  77 year old male, who appears uncomfortable, but is in no acute distress. Vital signs are significant for elevated blood pressure and borderline elevated heart rate. Oxygen saturation is 100%, which is normal. Head is normocephalic and atraumatic. PERRLA, EOMI. Oropharynx is clear. Neck is nontender and supple without adenopathy or JVD. Back is nontender and there is no CVA tenderness. Lungs are clear without rales, wheezes, or rhonchi. Chest is nontender. Heart has regular rate and rhythm without murmur. Abdomen is soft, flat, nontender without masses or hepatosplenomegaly and peristalsis is normoactive.  Bladder is distended to the umbilicus. Extremities have no cyanosis or edema, full range of motion is present. Skin is warm and dry without rash. Neurologic: Mental status is normal,  cranial nerves are intact, there are no motor or sensory deficits.  ED Treatments / Results  Labs (all labs ordered are listed, but only abnormal results are displayed) Labs Reviewed  URINALYSIS, ROUTINE W REFLEX MICROSCOPIC - Abnormal; Notable for the following components:      Result Value   APPearance HAZY (*)    Hgb urine dipstick LARGE (*)    Protein, ur 30 (*)    Leukocytes, UA MODERATE (*)    RBC / HPF >50 (*)    WBC, UA >50 (*)    Bacteria, UA RARE (*)    All other components within normal limits  URINE CULTURE   Procedures Procedures   Medications Ordered in ED Medications - No data to display   Initial Impression / Assessment and  Plan / ED Course  I have reviewed the triage vital signs and the nursing notes.  Pertinent labs & imaging results that were available during my care of the patient were reviewed by me and considered in my medical decision making (see chart for details).  Acute urinary retention.  Old records are reviewed confirming transurethral resection of prostate on April 16, ED visit for urinary retention prior to that.  Foley catheter will be placed.  Bladder drained over 1000 mL following placement of Foley catheter.  Urinalysis shows no evidence of infection, but has been sent for culture.  He is discharged with instructions to follow-up with his urologist.  Final Clinical Impressions(s) / ED Diagnoses   Final diagnoses:  Acute urinary retention    ED Discharge Orders    None       Delora Fuel, MD 43/83/81 [phone]

## 2018-01-21 ENCOUNTER — Inpatient Hospital Stay: Payer: Medicare Other | Attending: Oncology

## 2018-01-21 ENCOUNTER — Telehealth: Payer: Self-pay | Admitting: Oncology

## 2018-01-21 ENCOUNTER — Telehealth: Payer: Self-pay | Admitting: *Deleted

## 2018-01-21 ENCOUNTER — Other Ambulatory Visit: Payer: Self-pay | Admitting: Oncology

## 2018-01-21 ENCOUNTER — Ambulatory Visit: Payer: Medicare Other | Admitting: Oncology

## 2018-01-21 DIAGNOSIS — C7B02 Secondary carcinoid tumors of liver: Secondary | ICD-10-CM | POA: Insufficient documentation

## 2018-01-21 DIAGNOSIS — R630 Anorexia: Secondary | ICD-10-CM | POA: Insufficient documentation

## 2018-01-21 DIAGNOSIS — R634 Abnormal weight loss: Secondary | ICD-10-CM | POA: Diagnosis not present

## 2018-01-21 DIAGNOSIS — R197 Diarrhea, unspecified: Secondary | ICD-10-CM | POA: Diagnosis not present

## 2018-01-21 DIAGNOSIS — C7B8 Other secondary neuroendocrine tumors: Secondary | ICD-10-CM | POA: Diagnosis not present

## 2018-01-21 LAB — URINE CULTURE: Culture: NO GROWTH

## 2018-01-21 MED ORDER — OCTREOTIDE ACETATE 30 MG IM KIT
PACK | INTRAMUSCULAR | Status: AC
  Filled 2018-01-21: qty 1

## 2018-01-21 MED ORDER — OCTREOTIDE ACETATE 20 MG IM KIT
PACK | INTRAMUSCULAR | Status: AC
  Filled 2018-01-21: qty 1

## 2018-01-21 MED ORDER — OCTREOTIDE ACETATE 30 MG IM KIT
30.0000 mg | PACK | Freq: Once | INTRAMUSCULAR | Status: AC
  Administered 2018-01-21: 30 mg via INTRAMUSCULAR

## 2018-01-21 NOTE — Telephone Encounter (Signed)
Spoke with pt's daughter, she reports he was too weak to come in today. She requests to bring him in for Sandostatin, would like to reschedule office visit. Daughter reports pt has been decreasing oral intake to prevent diarrhea. Pt reported to daughter that he notices more loose stools as the next sandostatin dose approaches.  Instructed her to stop at scheduling for new appts.

## 2018-01-21 NOTE — Telephone Encounter (Signed)
Patient called to reschedule  °

## 2018-01-25 ENCOUNTER — Telehealth: Payer: Self-pay

## 2018-01-25 NOTE — Telephone Encounter (Signed)
Pt daughter called. States "diarrhea 3x daily. In past week it has worsened. pt had Injection on Thursday, Sandostatin. Pt daughter denies any nausea/vomiting  Pt also states that "Injection clearing up black spots on hands and leg". Pt daughter also asked for further recommendations on OTC meds for diarrhea and about an injection called Lanreotide. Per MD, pt to continue to monitor. Pt to take imodium as needed for diarrhea. MD also states that Lanreotide is an option if the Sandostatin were to fail, no better just a different option. Pt daughter voiced understanding.

## 2018-01-26 DIAGNOSIS — R338 Other retention of urine: Secondary | ICD-10-CM | POA: Diagnosis not present

## 2018-01-26 DIAGNOSIS — C7A8 Other malignant neuroendocrine tumors: Secondary | ICD-10-CM | POA: Diagnosis not present

## 2018-01-26 DIAGNOSIS — N323 Diverticulum of bladder: Secondary | ICD-10-CM | POA: Diagnosis not present

## 2018-01-26 DIAGNOSIS — N401 Enlarged prostate with lower urinary tract symptoms: Secondary | ICD-10-CM | POA: Diagnosis not present

## 2018-01-26 DIAGNOSIS — E340 Carcinoid syndrome: Secondary | ICD-10-CM | POA: Diagnosis not present

## 2018-01-26 DIAGNOSIS — Z466 Encounter for fitting and adjustment of urinary device: Secondary | ICD-10-CM | POA: Diagnosis not present

## 2018-01-27 ENCOUNTER — Encounter (HOSPITAL_COMMUNITY): Payer: Medicare Other

## 2018-01-27 ENCOUNTER — Encounter (HOSPITAL_COMMUNITY)
Admission: RE | Admit: 2018-01-27 | Discharge: 2018-01-27 | Disposition: A | Discharge status: Home / Self Care | Payer: Medicare Other | Source: Ambulatory Visit | Attending: Nurse Practitioner | Admitting: Nurse Practitioner

## 2018-01-27 DIAGNOSIS — Z466 Encounter for fitting and adjustment of urinary device: Secondary | ICD-10-CM | POA: Diagnosis not present

## 2018-01-27 DIAGNOSIS — C7B8 Other secondary neuroendocrine tumors: Secondary | ICD-10-CM | POA: Insufficient documentation

## 2018-01-27 DIAGNOSIS — E340 Carcinoid syndrome: Secondary | ICD-10-CM | POA: Diagnosis not present

## 2018-01-27 DIAGNOSIS — C7A8 Other malignant neuroendocrine tumors: Secondary | ICD-10-CM | POA: Diagnosis not present

## 2018-01-27 DIAGNOSIS — N323 Diverticulum of bladder: Secondary | ICD-10-CM | POA: Diagnosis not present

## 2018-01-27 DIAGNOSIS — N401 Enlarged prostate with lower urinary tract symptoms: Secondary | ICD-10-CM | POA: Diagnosis not present

## 2018-01-27 DIAGNOSIS — C7A1 Malignant poorly differentiated neuroendocrine tumors: Secondary | ICD-10-CM | POA: Diagnosis not present

## 2018-01-27 DIAGNOSIS — R338 Other retention of urine: Secondary | ICD-10-CM | POA: Diagnosis not present

## 2018-01-27 NOTE — Consult Note (Signed)
Chief Complaint: Patient with metastatic neuroendocrine tumor was for  evaluation Peptide receptor radiotherapy (PRRT) with IR678 DOTATATE (Lutathera).  Referring Physician(s): Sherrill    Patient Status: [facility] - Out-pt  History of Present Illness: Charles Schmidt is a 77 year old male who underwent CT examination for weight loss and diarrhea and was found to have extensive hepatic metastasis.  Additional central mesenteric mass was identified.  Biopsy liver lesions demonstrated well differentiated neuroendocrine tumor (grade 2). Patient subsequently underwent gallium 24 DOTATATE PET scan which revealed intense avidity for the somatostatin receptor specific radiotracer within the diffuse liver lesions.  Lesions involving extensively the LEFT and RIGHT hepatic lobe.  Additionally the central mesenteric mass were avid for DOTATATE radiotracer.  Additional lesions were noted within the mesentery and probable small bowel.  A solitary LEFT lower lobe pulmonary nodule is also avid.  No skeletal metastasis]    Past Medical History:  Diagnosis Date  . BPH with obstruction/lower urinary tract symptoms   . Cataract, mature    12-28-2017  per pt right eye, legally blind  . Diarrhea concurrent with and due to carcinoid syndrome ([place])    12-28-2017 improvement since started Sandostatin monthly  . ED (erectile dysfunction)   . Elevated PSA   . Family history of cancer in son   . Family history of prostate cancer   . Foley catheter in place   . Glaucoma, left eye   . History of colon polyps   . History of urinary retention    2015; 2016; 01/ 2019  . Hypertension   . Metastatic malignant neuroendocrine tumor to liver [facility]) oncoloigst-  dr Benay Spice   dx 10-23-2017 by liver bx-- WHO grade 2; small bowel mesenteric mass, cirrhosis, mets left lower lobe solitary nodule ,  periaortic and paraspinal lymph nodes METS--  started monthly Sandostatin 11-20-2017  . Microcytic anemia   . RBBB (right bundle  branch block with left anterior fascicular block)   . Urinary retention   . Venous reflux     Past Surgical History:  Procedure Laterality Date  . INGUINAL HERNIA REPAIR  1990s   unsure side  . IR US GUIDE BX ASP/DRAIN  10/23/2017  . QUADRICEPS TENDON REPAIR Left 01-08-2011   dr Veverly Fells  . THULIUM LASER TURP (TRANSURETHRAL RESECTION OF PROSTATE) N/A 01/05/2018   Procedure: Marcelino Duster LASER OF THE PROSTATE;  Surgeon: Festus Aloe, MD;  Location: [facility];  Service: Urology;  Laterality: N/A;  . TONSILLECTOMY  child    Allergies: Patient has no known allergies.  Medications: Prior to Admission medications   Medication Sig Start Date End Date Taking? Authorizing Provider  amLODipine (NORVASC) 5 MG tablet Take 5 mg by mouth every evening.     [provider]  dorzolamide-timolol (COSOPT) 22.3-6.8 MG/ML ophthalmic solution Place 1 drop into the left eye 2 (two) times daily.     [provider]  finasteride (PROSCAR) 5 MG tablet Take 5 mg by mouth every evening.     [provider]  hydrochlorothiazide (HYDRODIURIL) 25 MG tablet Take 25 mg by mouth every evening.     [provider]  Multiple Vitamins-Minerals (CENTRUM SILVER PO) Take by mouth daily.    [provider]  potassium chloride (K-DUR) 10 MEQ tablet Take 10 mEq by mouth every evening.  11/19/17   [provider]  Saw Palmetto, Serenoa repens, (SAW PALMETTO PO) Take by mouth daily.    [provider]  sulfamethoxazole-trimethoprim (BACTRIM DS,SEPTRA DS) 800-160 MG tablet  Take 1 tablet by mouth at bedtime. 01/05/18   Festus Aloe, MD  tamsulosin (FLOMAX) 0.4 MG CAPS capsule Take 1 capsule (0.4 mg total) by mouth daily after supper. Patient taking differently: Take 0.4 mg by mouth daily after supper.  10/22/14   [name], MD  Travoprost, BAK Free, (TRAVATAN) 0.004 % SOLN ophthalmic solution Place 1 drop into the left eye at bedtime.      [provider]     Family History  Problem Relation Age of Onset  . Hypertension Mother   . Stroke Father   . Prostate cancer Father        'slow growing'  . Kidney Stones Sister   . Cancer Son 42       neuroendocrine tumors of small intestine    Social History   Socioeconomic History  . Marital status: Legally Separated    Spouse name: Not on file  . Number of children: Not on file  . Years of education: Not on file  . Highest education level: Not on file  Occupational History  . Not on file  Social Needs  . Financial resource strain: Not on file  . Food insecurity:    Worry: Not on file    Inability: Not on file  . Transportation needs:    Medical: Not on file    Non-medical: Not on file  Tobacco Use  . Smoking status: Never Smoker  . Smokeless tobacco: Never Used  Substance and Sexual Activity  . Alcohol use: Not Currently    Alcohol/week: 0.6 oz    Types: 1 Standard drinks or equivalent per week    Comment: week  . Drug use: No  . Sexual activity: Not on file  Lifestyle  . Physical activity:    Days per week: Not on file    Minutes per session: Not on file  . Stress: Not on file  Relationships  . Social connections:    Talks on phone: Not on file    Gets together: Not on file    Attends religious service: Not on file    Active member of club or organization: Not on file    Attends meetings of clubs or organizations: Not on file    Relationship status: Not on file  Other Topics Concern  . Not on file  Social History Narrative  . Not on file    ECOG Status: 2 - Symptomatic, <50% confined to bed  Review of Systems: A 12 point ROS discussed and pertinent positives are indicated in the HPI above.  All other systems are negative.  Review of Systems  Constitutional: Positive for fatigue and unexpected weight change.  Gastrointestinal: Positive for abdominal distention, abdominal pain and diarrhea.  Genitourinary: Positive for decreased urine  volume and difficulty urinating.       Folly Catheter following TURP  Psychiatric/Behavioral: Negative.     Vital Signs: There were no vitals taken for this visit.  Physical Exam  Constitutional: He is oriented to person, place, and time. He appears well-developed.  HENT:  Head: Normocephalic.  Eyes: Conjunctivae are normal.  Cardiovascular: Normal rate, regular rhythm, normal heart sounds and intact distal pulses.  Pulmonary/Chest: Effort normal and breath sounds normal.  Abdominal: Soft. There is tenderness.  RUQ tenderness  Genitourinary:  Genitourinary Comments: Folly catheter  Neurological: He is alert and oriented to person, place, and time.  Skin: Skin is dry.  Psychiatric: He has a normal mood and affect.    Imaging: Nm Pet (  netspot Ga 64 Dotatate) Skull Base To Mid Thigh  Result Date: 11/24/2017 CLINICAL DATA:  Metastatic neuroendocrine tumor identified on liver biopsy. EXAM: NUCLEAR MEDICINE PET SKULL BASE TO THIGH TECHNIQUE: 5.2 mCi Ga 41 DOTATATE was injected intravenously. Full-ring PET imaging was performed from the skull base to thigh after the radiotracer. CT data was obtained and used for attenuation correction and anatomic localization. COMPARISON:  CT abdomen 10/13/2017 FINDINGS: NECK No radiotracer activity in neck lymph nodes. CHEST No abnormal radiotracer accumulation within mediastinal lymph nodes. No parenchymal nodules are present. One subpleural nodule is present in the LEFT lower lobe which measures 10 mm (image 93, series 4) and has clear associated radiotracer activity SUV max 8. ABDOMEN/PELVIS Near confluent tumor involvement the entire liver with multiple lesions which are avid for neuroendocrine tumor specific radiotracer DOTATATE. Liver lesions are too numerous to count. Example larger lesions in LEFT hepatic lobe has intense radiotracer activity with SUV max equal 22.5. Lesion RIGHT hepatic lobe lesion with SUV max equal 27.2 The central mesenteric mass  measuring 2.9 by 2.7 cm (image 152, series 4) and has intense radiotracer activity with SUV max 7.4 There is intense activity in an loop of small bowel LEFT adjacent to the central mesenteric lesion with SUV max equal 15.8. Discrete lesions difficult define in adjacent small bowel. Small focus of uptake within loop of small bowel whichdips into into the central pelvis (image 168 of fused data set). There is intense radiotracer activity associated with lymph nodes positioned between the aorta and uncinate of pancreas with SUV max 20.1. Lymph node positioned between the RIGHT paraspinal musculature and L2 vertebral bodyon the RIGHT has intense radiotracer activity with SUV max 26.2. Physiologic activity noted in the spleen, adrenal glands and kidneys. Incidental CT: Foley catheter within the bladder. Gas within the bladder. Benign LEFT renal cyst SKELETON No focal activity to suggest skeletal metastasis. IMPRESSION: 1. Intense radiotracer accumulation within innumerable confluent hepatic metastasis consistent well differentiated neuroendocrine tumor. 2. Intense radiotracer activity within central mesenteric mass consistent with metastatic neuroendocrine tumor. 3. Two foci of uptake associated the small bowel consists with primary or metastatic neuroendocrine tumor 4. Intense radiotracer activity associated with periaortic and paraspinal lymph nodes consistent with nodal metastasis. 5. More distant solitary small metastasis within the pleural space of the LEFT lower lobe. 6. No skeletal metastasis identified. Electronically Signed   By: Suzy Bouchard M.D.   On: 11/24/2017 15:10     Labs:  CBC: Recent Labs    10/14/17 1202 10/23/17 0832 10/28/17 1022 11/22/17 0736 01/05/18 0929  WBC 5.0 5.2 4.5 5.5  --   HGB 12.6* 12.1* 12.5* 11.8* 14.3  HCT 36.9* 35.4* 38.3* 34.8* 42.0  PLT 358 227 231 154  --     COAGS: Recent Labs    10/23/17 0832  INR 1.09  APTT 28    BMP: Recent Labs     10/14/17 1202 10/28/17 1022 11/22/17 0736 01/05/18 0929  NA 139 140 137 141  K 3.6 3.6 3.0* 3.9  CL 100* 102 101 101  CO2 26 30* 25  --   GLUCOSE 120* 103 143* 113*  BUN 7 10 9 11   CALCIUM 9.3 9.3 8.7*  --   CREATININE 0.90 0.91 0.89 0.80  GFRNONAA >60 >60 >60  --   GFRAA >60 >60 >60  --     LIVER FUNCTION TESTS: Recent Labs    10/28/17 1022  BILITOT 0.5  AST 34  ALT 34  ALKPHOS 204*  PROT 7.5  ALBUMIN 3.1*    TUMOR MARKERS: Chromogranin : 104 24 hour urine 5HIAA: 257  Assessment and Plan:  Patient is a good candidate for peptide receptor radiotherapy (Lu 177 DOTATATE).  Patient has extensive radiotracer avid metastasis within the LEFT and RIGHT hepatic lobe, central mesentery and small bowel.  The activity is high with SUV max greater than 20.  Elevated chromogranin and urine 5 HT levels.   Patient's renal function, hepatic function and blood counts are normal.  Patient has a carcinoid symptoms including diarrhea which is intermittent number technical.  No flushing or hyperintense.  Patient has Foley catheter for bladder obstruction/BPH.   Risk and benefits of procedure were explained to patient and patient's daughter.  Primary benefit being reduction in time to progression of  tumor.  Potential reduction in tumor volume and reduced symptomology are also potential benefit.  Risks of procedure include renal toxicity, marrow toxicity, and hepatic toxicity particularly with extensive liver burden.  These potential toxicity will  be followed closely in molecular imaging and the oncology department.  Minor risk includes hair loss and nausea associated with the procedures.  Additional radiation safety precautions were explained.  All questions were addressed and answered.   Patient tentatively scheduled for June 20th, 2019 as initial therapy.  Discontinuation Sandostatin 3 to 4 weeks prior to therapy is advised.    Thank you for this interesting consult.  I greatly enjoyed  meeting Charles Schmidt and look forward to participating in their care.  A copy of this report was sent to the requesting provider on this date.  Electronically Signed: Rennis Golden, MD 01/27/2018, 4:47 PM   I spent a total of  40 Minutes   in face to face in clinical consultation, greater than 50% of which was counseling/coordinating care for metastatic neuroendocrine tumor.

## 2018-01-28 ENCOUNTER — Other Ambulatory Visit: Payer: Self-pay | Admitting: Nurse Practitioner

## 2018-01-28 DIAGNOSIS — C7B8 Other secondary neuroendocrine tumors: Secondary | ICD-10-CM

## 2018-01-30 ENCOUNTER — Other Ambulatory Visit: Payer: Self-pay

## 2018-01-30 ENCOUNTER — Encounter (HOSPITAL_COMMUNITY): Payer: Self-pay

## 2018-01-30 ENCOUNTER — Emergency Department (HOSPITAL_COMMUNITY)
Admission: EM | Admit: 2018-01-30 | Discharge: 2018-01-30 | Disposition: A | Discharge status: Home / Self Care | Payer: Medicare Other | Attending: Emergency Medicine | Admitting: Emergency Medicine

## 2018-01-30 DIAGNOSIS — R1030 Lower abdominal pain, unspecified: Secondary | ICD-10-CM | POA: Diagnosis not present

## 2018-01-30 DIAGNOSIS — R339 Retention of urine, unspecified: Secondary | ICD-10-CM | POA: Insufficient documentation

## 2018-01-30 DIAGNOSIS — Y733 Surgical instruments, materials and gastroenterology and urology devices (including sutures) associated with adverse incidents: Secondary | ICD-10-CM | POA: Insufficient documentation

## 2018-01-30 DIAGNOSIS — Z79899 Other long term (current) drug therapy: Secondary | ICD-10-CM | POA: Insufficient documentation

## 2018-01-30 DIAGNOSIS — N3000 Acute cystitis without hematuria: Secondary | ICD-10-CM | POA: Diagnosis not present

## 2018-01-30 DIAGNOSIS — T83098A Other mechanical complication of other indwelling urethral catheter, initial encounter: Secondary | ICD-10-CM | POA: Diagnosis not present

## 2018-01-30 DIAGNOSIS — T839XXA Unspecified complication of genitourinary prosthetic device, implant and graft, initial encounter: Secondary | ICD-10-CM

## 2018-01-30 DIAGNOSIS — I10 Essential (primary) hypertension: Secondary | ICD-10-CM | POA: Insufficient documentation

## 2018-01-30 LAB — URINALYSIS, ROUTINE W REFLEX MICROSCOPIC
BILIRUBIN URINE: NEGATIVE
Glucose, UA: NEGATIVE mg/dL
KETONES UR: NEGATIVE mg/dL
Nitrite: POSITIVE — AB
PROTEIN: 100 mg/dL — AB
SPECIFIC GRAVITY, URINE: 1.012 (ref 1.005–1.030)
pH: 6 (ref 5.0–8.0)

## 2018-01-30 MED ORDER — LEVOFLOXACIN 750 MG PO TABS
750.0000 mg | ORAL_TABLET | Freq: Once | ORAL | Status: AC
  Administered 2018-01-30: 750 mg via ORAL
  Filled 2018-01-30: qty 1

## 2018-01-30 MED ORDER — LEVOFLOXACIN 750 MG PO TABS: 750.0000 mg | ORAL_TABLET | Freq: Every day | ORAL | 0 refills | Status: AC

## 2018-01-30 NOTE — Discharge Instructions (Signed)
We were able to exchange the catheter and resolve the blockage causing your abdominal discomfort and inability to urinate.  We also found evidence of infection.  Please take the antibiotics for the next 4 days starting tomorrow as you received a dose today.  Please follow-up with your primary care physician and a urologist.  If any symptoms change or worsen, please return to the nearest emergency department.

## 2018-01-30 NOTE — ED Triage Notes (Signed)
Pt here with indwelling foley catheter and states that there has been no urinary output since 1200 today. Pt has 442ml of urine sitting in bag. It was last emptied at 0800 this morning. Pt states he has small amount of discomfort in bladder area. VSS>

## 2018-01-30 NOTE — ED Provider Notes (Signed)
[facility] EMERGENCY DEPARTMENT Provider Note   CSN: [id number] Arrival date & time: 01/30/18  1621     History   Chief Complaint Chief Complaint  Patient presents with  . Foley catheter problem    HPI Charles Schmidt is a 77 y.o. male.  The history is provided by the patient and medical records.  Abdominal Pain   This is a recurrent problem. The current episode started 6 to 12 hours ago. The problem occurs constantly. The problem has not changed since onset.The pain is associated with an unknown factor. The pain is located in the suprapubic region. The quality of the pain is aching. The pain is mild. Pertinent negatives include diarrhea, nausea, vomiting, constipation, dysuria and frequency. Associated symptoms comments: Will not urinate through foley . Nothing aggravates the symptoms. Nothing relieves the symptoms. Past workup includes surgery.    Past Medical History:  Diagnosis Date  . BPH with obstruction/lower urinary tract symptoms   . Cataract, mature    12-28-2017  per pt right eye, legally blind  . Diarrhea concurrent with and due to carcinoid syndrome ([place])    12-28-2017 improvement since started Sandostatin monthly  . ED (erectile dysfunction)   . Elevated PSA   . Family history of cancer in son   . Family history of prostate cancer   . Foley catheter in place   . Glaucoma, left eye   . History of colon polyps   . History of urinary retention    2015; 2016; 01/ 2019  . Hypertension   . Metastatic malignant neuroendocrine tumor to liver [facility]) oncoloigst-  dr Benay Spice   dx 10-23-2017 by liver bx-- WHO grade 2; small bowel mesenteric mass, cirrhosis, mets left lower lobe solitary nodule ,  periaortic and paraspinal lymph nodes METS--  started monthly Sandostatin 11-20-2017  . Microcytic anemia   . RBBB (right bundle branch block with left anterior fascicular block)   . Urinary retention   . Venous reflux     Patient Active Problem List   Diagnosis Date Noted  . Genetic testing 12/18/2017  . Family history of cancer in son   . Family history of prostate cancer   . Metastatic malignant neuroendocrine tumor to liver ([place]) 10/28/2017  . Essential hypertension (primary) 05/04/2017  . Hypokalemia 05/04/2017  . Benign prostatic hyperplasia 05/04/2017  . Glaucoma 05/04/2017  . Benign hypertensive heart disease without heart failure 05/04/2017  . Microcytic anemia 05/04/2017  . Prediabetes 05/04/2017  . Hypocalcemia 05/04/2017  . Psoriasis 05/04/2017    Past Surgical History:  Procedure Laterality Date  . INGUINAL HERNIA REPAIR  1990s   unsure side  . IR US GUIDE BX ASP/DRAIN  10/23/2017  . QUADRICEPS TENDON REPAIR Left 01-08-2011   dr Veverly Fells  . THULIUM LASER TURP (TRANSURETHRAL RESECTION OF PROSTATE) N/A 01/05/2018   Procedure: Marcelino Duster LASER OF THE PROSTATE;  Surgeon: Festus Aloe, MD;  Location: [facility];  Service: Urology;  Laterality: N/A;  . TONSILLECTOMY  child        Home Medications    Prior to Admission medications   Medication Sig Start Date End Date Taking? Authorizing Provider  amLODipine (NORVASC) 5 MG tablet Take 5 mg by mouth every evening.     [provider]  dorzolamide-timolol (COSOPT) 22.3-6.8 MG/ML ophthalmic solution Place 1 drop into the left eye 2 (two) times daily.     [provider]  finasteride (PROSCAR) 5 MG tablet Take 5 mg by mouth every evening.  [provider]  hydrochlorothiazide (HYDRODIURIL) 25 MG tablet Take 25 mg by mouth every evening.     [provider]  Multiple Vitamins-Minerals (CENTRUM SILVER PO) Take by mouth daily.    [provider]  potassium chloride (K-DUR) 10 MEQ tablet Take 10 mEq by mouth every evening.  11/19/17   [provider]  Saw Palmetto, Serenoa repens, (SAW PALMETTO PO) Take by mouth daily.    [provider]  sulfamethoxazole-trimethoprim (BACTRIM DS,SEPTRA DS) 800-160  MG tablet Take 1 tablet by mouth at bedtime. 01/05/18   Festus Aloe, MD  tamsulosin (FLOMAX) 0.4 MG CAPS capsule Take 1 capsule (0.4 mg total) by mouth daily after supper. Patient taking differently: Take 0.4 mg by mouth daily after supper.  10/22/14   [name], MD  Travoprost, BAK Free, (TRAVATAN) 0.004 % SOLN ophthalmic solution Place 1 drop into the left eye at bedtime.     [provider]    Family History Family History  Problem Relation Age of Onset  . Hypertension Mother   . Stroke Father   . Prostate cancer Father        'slow growing'  . Kidney Stones Sister   . Cancer Son 4       neuroendocrine tumors of small intestine    Social History Social History   Tobacco Use  . Smoking status: Never Smoker  . Smokeless tobacco: Never Used  Substance Use Topics  . Alcohol use: Not Currently    Alcohol/week: 0.6 oz    Types: 1 Standard drinks or equivalent per week    Comment: week  . Drug use: No     Allergies   Patient has no known allergies.   Review of Systems Review of Systems  Constitutional: Negative for chills, diaphoresis and fatigue.  HENT: Negative for congestion.   Eyes: Negative for visual disturbance.  Respiratory: Negative for cough, chest tightness, shortness of breath and wheezing.   Cardiovascular: Negative for chest pain.  Gastrointestinal: Positive for abdominal pain. Negative for constipation, diarrhea, nausea and vomiting.  Genitourinary: Positive for decreased urine volume and difficulty urinating. Negative for dysuria, flank pain and frequency.  Musculoskeletal: Negative for back pain, neck pain and neck stiffness.  Skin: Negative for rash and wound.  Neurological: Negative for light-headedness.  Psychiatric/Behavioral: Negative for agitation.  All other systems reviewed and are negative.    Physical Exam Updated Vital Signs BP 136/85 (BP Location: Left Arm)   Pulse 72   Temp 98 F (36.7 C) (Oral)   Resp 14   Ht  6\' 5"  (1.956 m)   Wt 97.1 kg (214 lb)   SpO2 99%   BMI 25.38 kg/m   Physical Exam  Constitutional: He appears well-developed and well-nourished. No distress.  HENT:  Head: Normocephalic and atraumatic.  Mouth/Throat: No oropharyngeal exudate.  Eyes: Pupils are equal, round, and reactive to light. Conjunctivae and EOM are normal.  Neck: Neck supple.  Cardiovascular: Normal rate and regular rhythm.  No murmur heard. Pulmonary/Chest: Effort normal and breath sounds normal. No respiratory distress. He has no wheezes. He exhibits no tenderness.  Abdominal: Soft. There is no tenderness. There is no guarding.  Genitourinary: Penis normal. No penile tenderness.  Genitourinary Comments: Foley in place   Musculoskeletal: He exhibits no edema or tenderness.  Neurological: He is alert.  Skin: Skin is warm and dry. Capillary refill takes less than 2 seconds. He is not diaphoretic. No erythema. No pallor.  Psychiatric: He has a normal mood  and affect.  Nursing note and vitals reviewed.    ED Treatments / Results  Labs (all labs ordered are listed, but only abnormal results are displayed) Labs Reviewed  URINALYSIS, ROUTINE W REFLEX MICROSCOPIC - Abnormal; Notable for the following components:      Result Value   APPearance CLOUDY (*)    Hgb urine dipstick MODERATE (*)    Protein, ur 100 (*)    Nitrite POSITIVE (*)    Leukocytes, UA LARGE (*)    WBC, UA >50 (*)    Bacteria, UA RARE (*)    All other components within normal limits  URINE CULTURE    EKG None  Radiology No results found.  Procedures Procedures (including critical care time)  Medications Ordered in ED Medications  levofloxacin (LEVAQUIN) tablet 750 mg (750 mg Oral Given 01/30/18 2132)     Initial Impression / Assessment and Plan / ED Course  I have reviewed the triage vital signs and the nursing notes.  Pertinent labs & imaging results that were available during my care of the patient were reviewed by me and  considered in my medical decision making (see chart for details).     Charles Schmidt is a 77 y.o. male with a past medical history significant hypertension, BPH, and recent TURP status post urinary retention and Foley catheter dependence who presents with Foley catheter problem.  Patient reports that he has not urinated since 11 AM today.  He says this is consistent with his Foley catheter clogging.  He says this is happened before.  He reports he is having some mild abdominal discomfort and urine is leaking around the catheter from his penis.  He denies any fevers, chills, nausea, vomiting, flank pain, or other symptoms.  He denies trauma.  He denies any bleeding.  On exam, no significant abdominal tenderness however patient reports he feels his bladder is full.  There is urine coming around the catheter and no urine is going into the catheter.  No penile tenderness or scrotal tenderness.  Abdomen had normal bowel sounds.  Lungs clear.  Patient otherwise appears well.    In triage, bladder scan was reported to be 112 however patient says that whenever it is around 100 he actually has 1 L in his bladder.    Patient will have Foley catheter exchange and urinalysis will be collected.  Anticipate discharge after urinalysis completed and Foley catheter is exchanged with improvement in his urine.  9:25 PM Patient had a proximal me 1 L of urine, after catheter was replaced.  Suspect it was clogged.  Patient's abdomen was feeling better after this.  Urinalysis of the urine was collected with the new catheter shows evidence of UTI with nitrites leukocytes and bacteria.  Culture will be sent.  Patient given prescription for levofloxacin as well as a dose tonight.  Patient will follow-up with his PCP and his urologist.  Patient given instructions to watch for worsening UTI or pyelonephritis.  Patient voiced understanding of the plan of care as well as return precautions.  Patient had no other worsens or concerns  and was discharged in good condition.    Final Clinical Impressions(s) / ED Diagnoses   Final diagnoses:  Problem with Foley catheter, initial encounter [facility]  Acute cystitis without hematuria    ED Discharge Orders        Ordered    levofloxacin (LEVAQUIN) 750 MG tablet  Daily     01/30/18 2124      Clinical Impression: 1.  Problem with Foley catheter, initial encounter ([place])   2. Acute cystitis without hematuria     Disposition: Discharge  Condition: Good  I have discussed the results, Dx and Tx plan with the pt(& family if present). He/she/they expressed understanding and agree(s) with the plan. Discharge instructions discussed at great length. Strict return precautions discussed and pt &/or family have verbalized understanding of the instructions. No further questions at time of discharge.    New Prescriptions   LEVOFLOXACIN (LEVAQUIN) 750 MG TABLET    Take 1 tablet (750 mg total) by mouth daily for 4 days.    Follow Up: Benito Mccreedy, MD [address] [place] [address] [place] EMERGENCY DEPARTMENT [address] [id number] mc [place]       Rickie Gutierres, Gwenyth Allegra, MD 01/31/18 [phone]

## 2018-02-01 LAB — URINE CULTURE: Culture: >=100000 — AB

## 2018-02-02 ENCOUNTER — Telehealth: Payer: Self-pay | Admitting: Emergency Medicine

## 2018-02-02 NOTE — Telephone Encounter (Signed)
Post ED Visit - Positive Culture Follow-up  Culture report reviewed by antimicrobial stewardship pharmacist:  [x]  Elenor Quinones, Pharm.D. []  Heide Guile, Pharm.D., BCPS AQ-ID []  Parks Neptune, Pharm.D., BCPS []  Alycia Rossetti, Pharm.D., BCPS []  [place], [place].D., BCPS, AAHIVP []  Legrand Como, Pharm.D., BCPS, AAHIVP []  Salome Arnt, PharmD, BCPS []  Wynell Balloon, PharmD []  Vincenza Hews, PharmD, BCPS  Positive urine culture Treated with levofloxacin, organism sensitive to the same and no further patient follow-up is required at this time.  Hazle Nordmann 02/02/2018, 3:25 PM

## 2018-02-04 DIAGNOSIS — R338 Other retention of urine: Secondary | ICD-10-CM | POA: Diagnosis not present

## 2018-02-04 DIAGNOSIS — Z466 Encounter for fitting and adjustment of urinary device: Secondary | ICD-10-CM | POA: Diagnosis not present

## 2018-02-04 DIAGNOSIS — N401 Enlarged prostate with lower urinary tract symptoms: Secondary | ICD-10-CM | POA: Diagnosis not present

## 2018-02-04 DIAGNOSIS — N323 Diverticulum of bladder: Secondary | ICD-10-CM | POA: Diagnosis not present

## 2018-02-04 DIAGNOSIS — C7A8 Other malignant neuroendocrine tumors: Secondary | ICD-10-CM | POA: Diagnosis not present

## 2018-02-04 DIAGNOSIS — E340 Carcinoid syndrome: Secondary | ICD-10-CM | POA: Diagnosis not present

## 2018-02-09 DIAGNOSIS — N323 Diverticulum of bladder: Secondary | ICD-10-CM | POA: Diagnosis not present

## 2018-02-09 DIAGNOSIS — Z466 Encounter for fitting and adjustment of urinary device: Secondary | ICD-10-CM | POA: Diagnosis not present

## 2018-02-09 DIAGNOSIS — N401 Enlarged prostate with lower urinary tract symptoms: Secondary | ICD-10-CM | POA: Diagnosis not present

## 2018-02-09 DIAGNOSIS — C7A8 Other malignant neuroendocrine tumors: Secondary | ICD-10-CM | POA: Diagnosis not present

## 2018-02-09 DIAGNOSIS — R338 Other retention of urine: Secondary | ICD-10-CM | POA: Diagnosis not present

## 2018-02-09 DIAGNOSIS — E340 Carcinoid syndrome: Secondary | ICD-10-CM | POA: Diagnosis not present

## 2018-02-11 DIAGNOSIS — Z466 Encounter for fitting and adjustment of urinary device: Secondary | ICD-10-CM | POA: Diagnosis not present

## 2018-02-11 DIAGNOSIS — E340 Carcinoid syndrome: Secondary | ICD-10-CM | POA: Diagnosis not present

## 2018-02-11 DIAGNOSIS — N401 Enlarged prostate with lower urinary tract symptoms: Secondary | ICD-10-CM | POA: Diagnosis not present

## 2018-02-11 DIAGNOSIS — N323 Diverticulum of bladder: Secondary | ICD-10-CM | POA: Diagnosis not present

## 2018-02-11 DIAGNOSIS — R338 Other retention of urine: Secondary | ICD-10-CM | POA: Diagnosis not present

## 2018-02-11 DIAGNOSIS — C7A8 Other malignant neuroendocrine tumors: Secondary | ICD-10-CM | POA: Diagnosis not present

## 2018-02-13 ENCOUNTER — Emergency Department (HOSPITAL_COMMUNITY)
Admission: EM | Admit: 2018-02-13 | Discharge: 2018-02-13 | Disposition: A | Discharge status: Home / Self Care | Payer: Medicare Other | Attending: Emergency Medicine | Admitting: Emergency Medicine

## 2018-02-13 ENCOUNTER — Encounter (HOSPITAL_COMMUNITY): Payer: Self-pay | Admitting: Emergency Medicine

## 2018-02-13 DIAGNOSIS — Z5321 Procedure and treatment not carried out due to patient leaving prior to being seen by health care provider: Secondary | ICD-10-CM | POA: Diagnosis not present

## 2018-02-13 DIAGNOSIS — Z960 Presence of urogenital implants: Secondary | ICD-10-CM | POA: Insufficient documentation

## 2018-02-13 NOTE — ED Triage Notes (Signed)
Patient reports his catheter hasn't drained any since noon today.  400cc amber urine noted in bag at triage.  Reports some discomfort when walking.  Foley originally placed on 01/30/18.

## 2018-02-13 NOTE — ED Notes (Addendum)
Emptied foley bag at this time.  Flushed foley using aseptic technique with 20cc's NS.  Now draining without difficulty.  Reports pain is better at this time rating at 2/10.

## 2018-02-13 NOTE — ED Notes (Signed)
Pt states his catheter is working fine now and he is leaving.

## 2018-02-15 DIAGNOSIS — Z466 Encounter for fitting and adjustment of urinary device: Secondary | ICD-10-CM | POA: Diagnosis not present

## 2018-02-15 DIAGNOSIS — R338 Other retention of urine: Secondary | ICD-10-CM | POA: Diagnosis not present

## 2018-02-15 DIAGNOSIS — E340 Carcinoid syndrome: Secondary | ICD-10-CM | POA: Diagnosis not present

## 2018-02-15 DIAGNOSIS — N401 Enlarged prostate with lower urinary tract symptoms: Secondary | ICD-10-CM | POA: Diagnosis not present

## 2018-02-15 DIAGNOSIS — C7A8 Other malignant neuroendocrine tumors: Secondary | ICD-10-CM | POA: Diagnosis not present

## 2018-02-15 DIAGNOSIS — N323 Diverticulum of bladder: Secondary | ICD-10-CM | POA: Diagnosis not present

## 2018-02-18 DIAGNOSIS — Z466 Encounter for fitting and adjustment of urinary device: Secondary | ICD-10-CM | POA: Diagnosis not present

## 2018-02-18 DIAGNOSIS — C7A8 Other malignant neuroendocrine tumors: Secondary | ICD-10-CM | POA: Diagnosis not present

## 2018-02-18 DIAGNOSIS — N323 Diverticulum of bladder: Secondary | ICD-10-CM | POA: Diagnosis not present

## 2018-02-18 DIAGNOSIS — R338 Other retention of urine: Secondary | ICD-10-CM | POA: Diagnosis not present

## 2018-02-18 DIAGNOSIS — E340 Carcinoid syndrome: Secondary | ICD-10-CM | POA: Diagnosis not present

## 2018-02-18 DIAGNOSIS — N401 Enlarged prostate with lower urinary tract symptoms: Secondary | ICD-10-CM | POA: Diagnosis not present

## 2018-02-19 ENCOUNTER — Telehealth: Payer: Self-pay

## 2018-02-19 ENCOUNTER — Inpatient Hospital Stay: Payer: Medicare Other

## 2018-02-19 ENCOUNTER — Encounter: Payer: Medicare Other | Admitting: Nutrition

## 2018-02-19 ENCOUNTER — Telehealth: Payer: Self-pay | Admitting: Oncology

## 2018-02-19 ENCOUNTER — Inpatient Hospital Stay (HOSPITAL_BASED_OUTPATIENT_CLINIC_OR_DEPARTMENT_OTHER): Payer: Medicare Other | Admitting: Oncology

## 2018-02-19 VITALS — BP 128/91 | HR 97 | Temp 97.9°F | Resp 20 | Ht 76.0 in | Wt 205.4 lb

## 2018-02-19 DIAGNOSIS — C7B8 Other secondary neuroendocrine tumors: Secondary | ICD-10-CM

## 2018-02-19 DIAGNOSIS — R634 Abnormal weight loss: Secondary | ICD-10-CM

## 2018-02-19 DIAGNOSIS — R630 Anorexia: Secondary | ICD-10-CM | POA: Diagnosis not present

## 2018-02-19 DIAGNOSIS — R197 Diarrhea, unspecified: Secondary | ICD-10-CM

## 2018-02-19 DIAGNOSIS — C7B02 Secondary carcinoid tumors of liver: Secondary | ICD-10-CM

## 2018-02-19 MED ORDER — OCTREOTIDE ACETATE 30 MG IM KIT: 30.0000 mg | PACK | Freq: Once | INTRAMUSCULAR | Status: DC

## 2018-02-19 MED ORDER — OCTREOTIDE ACETATE 30 MG IM KIT
PACK | INTRAMUSCULAR | Status: AC
  Filled 2018-02-19: qty 1

## 2018-02-19 NOTE — Telephone Encounter (Signed)
Printed avs and calender of upcoming appointment. Per 5/31 los

## 2018-02-19 NOTE — Telephone Encounter (Signed)
Scheduled appt per 5/31 sch message - pt is aware.

## 2018-02-19 NOTE — Progress Notes (Signed)
  [facility] OFFICE PROGRESS NOTE   Diagnosis: Carcinoid tumor  INTERVAL HISTORY:   Mr. Mckinney returns as scheduled.  He last received Sandostatin on 01/21/2018.  The Sandostatin dose was increased to 30 mg.  He reports the diarrhea has resolved.  He takes Imodium in the evening. He is scheduled for Lutathera on 03/03/2018. He underwent a laser vaporization of the prostate on 01/05/2018.  The Foley catheter has been removed.  He is performing intermittent self-catheterization.  Objective:  Vital signs in last 24 hours:  Blood pressure (!) 128/91, pulse 97, temperature 97.9 F (36.6 C), temperature source Oral, resp. rate 20, height _0  (1.93 m), weight 205 lb 6.4 oz (93.2 kg), SpO2 100 %.    Resp: Distant breath sounds, no respiratory distress Cardio: Regular rate and rhythm GI: Marked hepatomegaly extending across the midline with mild associated tenderness Vascular: No leg edema     Lab Results:  Lab Results  Component Value Date   WBC 5.5 11/22/2017   HGB 14.3 01/05/2018   HCT 42.0 01/05/2018   MCV 72.8 (L) 11/22/2017   PLT 154 11/22/2017   NEUTROABS 4.1 11/22/2017    CMP  Lab Results  Component Value Date   NA 141 01/05/2018   K 3.9 01/05/2018   CL 101 01/05/2018   CO2 25 11/22/2017   GLUCOSE 113 (H) 01/05/2018   BUN 11 01/05/2018   CREATININE 0.80 01/05/2018   CALCIUM 8.7 (L) 11/22/2017   PROT 7.5 10/28/2017   ALBUMIN 3.1 (L) 10/28/2017   AST 34 10/28/2017   ALT 34 10/28/2017   ALKPHOS 204 (H) 10/28/2017   BILITOT 0.5 10/28/2017   GFRNONAA >60 11/22/2017   GFRAA >60 11/22/2017    Medications: I have reviewed the patient's current medications.   Assessment/Plan: 1.Metastatic carcinoid tumor, biopsy of a liver lesion 10/23/2017 consistent with a well-differentiated neuroendocrine neoplasm, WHO grade 2,ki-6710%  CT abdomen/pelvis 10/13/2017-extensive liver metastases, cirrhosis, soft tissue mass in the small bowel mesentery versus a  primary small bowel tumor, tiny lucent lesions in the pelvic bones  Elevated chromogranin A and 24-hour urine 5-HIAA  CT chest 11/24/2017- subpleural nodule in the left lower lobe with associated radiotracer activity on comparison DOTATATE PET scan.  No additional evidence of thoracic metastasis.  DOTATATE PET scan 11/24/2017-intense radiotracer accumulation with innumerable confluent hepatic metastasis; intense radiotracer activity within central mesenteric mass; 2 foci of uptake associated with the small bowel; intense radiotracer activity associated periaortic and paraspinal lymph nodes; more distant solitary small metastasis within the pleural space left lower lobe.  Monthly Sandostatin initiated 11/26/2017, dose increased to 30 mg 01/21/2018  2.Diarrhea-likely secondary to carcinoid syndrome, improved with Sandostatin 3.Anorexia/weight loss 4.Prostatic hypertrophy;  status post laser vaporization 01/05/2018   Disposition: Mr. Wiberg has been maintained on Sandostatin for the past 2 months.  The diarrhea has improved.  He continues to lose weight.  We will arrange for an appointment with the Cancer center nutritionist.  He is scheduled for Lutathera on 03/03/2018.  Sandostatin will be held today.  He will return for an office visit and Sandostatin on 03/31/2018.  He continues follow-up with Dr. Junious Silk for management of the urinary retention.  Betsy Coder, MD  02/19/2018  8:30 AM

## 2018-02-23 DIAGNOSIS — N401 Enlarged prostate with lower urinary tract symptoms: Secondary | ICD-10-CM | POA: Diagnosis not present

## 2018-02-23 DIAGNOSIS — Z466 Encounter for fitting and adjustment of urinary device: Secondary | ICD-10-CM | POA: Diagnosis not present

## 2018-02-23 DIAGNOSIS — C7A8 Other malignant neuroendocrine tumors: Secondary | ICD-10-CM | POA: Diagnosis not present

## 2018-02-23 DIAGNOSIS — N323 Diverticulum of bladder: Secondary | ICD-10-CM | POA: Diagnosis not present

## 2018-02-23 DIAGNOSIS — E340 Carcinoid syndrome: Secondary | ICD-10-CM | POA: Diagnosis not present

## 2018-02-23 DIAGNOSIS — R338 Other retention of urine: Secondary | ICD-10-CM | POA: Diagnosis not present

## 2018-02-25 DIAGNOSIS — N323 Diverticulum of bladder: Secondary | ICD-10-CM | POA: Diagnosis not present

## 2018-02-25 DIAGNOSIS — R338 Other retention of urine: Secondary | ICD-10-CM | POA: Diagnosis not present

## 2018-02-25 DIAGNOSIS — E340 Carcinoid syndrome: Secondary | ICD-10-CM | POA: Diagnosis not present

## 2018-02-25 DIAGNOSIS — Z466 Encounter for fitting and adjustment of urinary device: Secondary | ICD-10-CM | POA: Diagnosis not present

## 2018-02-25 DIAGNOSIS — N401 Enlarged prostate with lower urinary tract symptoms: Secondary | ICD-10-CM | POA: Diagnosis not present

## 2018-02-25 DIAGNOSIS — C7A8 Other malignant neuroendocrine tumors: Secondary | ICD-10-CM | POA: Diagnosis not present

## 2018-03-02 DIAGNOSIS — R338 Other retention of urine: Secondary | ICD-10-CM | POA: Diagnosis not present

## 2018-03-02 DIAGNOSIS — C7A8 Other malignant neuroendocrine tumors: Secondary | ICD-10-CM | POA: Diagnosis not present

## 2018-03-02 DIAGNOSIS — N323 Diverticulum of bladder: Secondary | ICD-10-CM | POA: Diagnosis not present

## 2018-03-02 DIAGNOSIS — E340 Carcinoid syndrome: Secondary | ICD-10-CM | POA: Diagnosis not present

## 2018-03-02 DIAGNOSIS — N401 Enlarged prostate with lower urinary tract symptoms: Secondary | ICD-10-CM | POA: Diagnosis not present

## 2018-03-02 DIAGNOSIS — Z466 Encounter for fitting and adjustment of urinary device: Secondary | ICD-10-CM | POA: Diagnosis not present

## 2018-03-03 ENCOUNTER — Encounter (HOSPITAL_COMMUNITY)
Admission: RE | Admit: 2018-03-03 | Discharge: 2018-03-03 | Disposition: A | Discharge status: Home / Self Care | Payer: Medicare Other | Source: Ambulatory Visit | Attending: Nurse Practitioner | Admitting: Nurse Practitioner

## 2018-03-03 DIAGNOSIS — C7B8 Other secondary neuroendocrine tumors: Secondary | ICD-10-CM

## 2018-03-03 DIAGNOSIS — C220 Liver cell carcinoma: Secondary | ICD-10-CM | POA: Diagnosis not present

## 2018-03-03 LAB — COMPREHENSIVE METABOLIC PANEL
ALT: 31 U/L (ref 17–63)
AST: 46 U/L — AB (ref 15–41)
Albumin: 3.5 g/dL (ref 3.5–5.0)
Alkaline Phosphatase: 192 U/L — ABNORMAL HIGH (ref 38–126)
Anion gap: 10 (ref 5–15)
BUN: 14 mg/dL (ref 6–20)
CHLORIDE: 102 mmol/L (ref 101–111)
CO2: 29 mmol/L (ref 22–32)
CREATININE: 0.95 mg/dL (ref 0.61–1.24)
Calcium: 9.2 mg/dL (ref 8.9–10.3)
GFR calc Af Amer: >60 mL/min (ref >60)
GFR calc non Af Amer: >60 mL/min (ref >60)
Glucose, Bld: 133 mg/dL — ABNORMAL HIGH (ref 65–99)
Potassium: 3 mmol/L — ABNORMAL LOW (ref 3.5–5.1)
SODIUM: 141 mmol/L (ref 135–145)
Total Bilirubin: 1 mg/dL (ref 0.3–1.2)
Total Protein: 7.6 g/dL (ref 6.5–8.1)

## 2018-03-03 LAB — CBC WITH DIFFERENTIAL/PLATELET
BASOS ABS: 0 10*3/uL (ref 0.0–0.1)
Basophils Relative: 0 %
EOS ABS: 0.2 10*3/uL (ref 0.0–0.7)
EOS PCT: 4 %
HCT: 37.6 % — ABNORMAL LOW (ref 39.0–52.0)
Hemoglobin: 12.7 g/dL — ABNORMAL LOW (ref 13.0–17.0)
LYMPHS PCT: 36 %
Lymphs Abs: 1.5 10*3/uL (ref 0.7–4.0)
MCH: 25.3 pg — ABNORMAL LOW (ref 26.0–34.0)
MCHC: 33.8 g/dL (ref 30.0–36.0)
MCV: 75 fL — ABNORMAL LOW (ref 78.0–100.0)
Monocytes Absolute: 0.4 10*3/uL (ref 0.1–1.0)
Monocytes Relative: 9 %
Neutro Abs: 2.1 10*3/uL (ref 1.7–7.7)
Neutrophils Relative %: 51 %
PLATELETS: 176 10*3/uL (ref 150–400)
RBC: 5.01 MIL/uL (ref 4.22–5.81)
RDW: 18.5 % — ABNORMAL HIGH (ref 11.5–15.5)
WBC: 4.1 10*3/uL (ref 4.0–10.5)

## 2018-03-03 MED ORDER — OCTREOTIDE ACETATE 30 MG IM KIT
30.0000 mg | PACK | Freq: Once | INTRAMUSCULAR | Status: AC
  Administered 2018-03-03: 30 mg via INTRAMUSCULAR

## 2018-03-03 MED ORDER — OCTREOTIDE ACETATE 500 MCG/ML IJ SOLN
INTRAMUSCULAR | Status: AC
  Filled 2018-03-03: qty 1

## 2018-03-03 MED ORDER — AMINO ACID RADIOPROTECTANT - L-LYSINE 2.5%/L-ARGININE 2.5% IN NS
250.0000 mL/h | INTRAVENOUS | Status: AC
  Administered 2018-03-03: 250 mL/h via INTRAVENOUS
  Filled 2018-03-03: qty 1000

## 2018-03-03 MED ORDER — LUTETIUM LU 177 DOTATATE 370 MBQ/ML IV SOLN: 200.0000 | Freq: Once | INTRAVENOUS | Status: DC

## 2018-03-03 MED ORDER — SODIUM CHLORIDE 0.9 % IV SOLN
8.0000 mg | Freq: Once | INTRAVENOUS | Status: AC
  Administered 2018-03-03: 8 mg via INTRAVENOUS
  Filled 2018-03-03: qty 4

## 2018-03-03 MED ORDER — OCTREOTIDE ACETATE 500 MCG/ML IJ SOLN: 500.0000 ug | Freq: Once | INTRAMUSCULAR | Status: DC | PRN

## 2018-03-03 MED ORDER — SODIUM CHLORIDE 0.9 % IV SOLN: 500.0000 mL | Freq: Once | INTRAVENOUS | Status: DC

## 2018-03-03 MED ORDER — OCTREOTIDE ACETATE 30 MG IM KIT
PACK | INTRAMUSCULAR | Status: AC
  Filled 2018-03-03: qty 1

## 2018-03-03 NOTE — Progress Notes (Signed)
Patient ID: Charles Schmidt, male   DOB: 20-Jan-1941, 77 y.o.   MRN: [id number]   RADIOPHARMACEUTICALS:  [203] mCi Lu 177 DOTATATE  FINDINGS: Diagnosis: [Metastatic neuroendocrine tumor.]  Current Infusion: [1]  Planned Infusions: [4]  Patient presented to nuclear medicine for treatment. The patient's most recent blood counts were reviewed and remains a good candidate to proceed with Lutathera.  Patient has minimal symptoms with mild diarrhea.  Mild anemia on today's labs.  Chromogranin was drawn.    The patient was thoroughly advised of the risk and benefits procedure and informed consent was obtained.  Patient counseled on radiation safety.   Patient was the patient was situated in an infusion suite and administered Lutathera as above.  No nausea or vomiting or other discomfort during infusion.  Patient will follow-up with referring oncologist for interval serum laboratories.  Patient received 30 mg IM long-acting Sandostatin injection 4 hours after Lutathera effusion in the nuclear medicine department.  IMPRESSION:  [First] YF 749 [id number] treatment for metastatic neuroendocrine tumor. The patient tolerated the infusion well. The patient will return in 8 weeks for ongoing care.

## 2018-03-03 NOTE — Progress Notes (Signed)
Pt finished Luterathera infusion at  10:38am.  Pt tolerated infusion without nausea, flushing, or pain.  Pt's right AC IV was flushed with 58ml of NaCl.  No evidence of infiltration noted on either of pt's IV.  Pt encouraged to drink and also to void.  VSS.  Will continue to monitor.

## 2018-03-08 ENCOUNTER — Inpatient Hospital Stay: Payer: Medicare Other | Attending: Oncology | Admitting: Nutrition

## 2018-03-08 DIAGNOSIS — C7A8 Other malignant neuroendocrine tumors: Secondary | ICD-10-CM | POA: Diagnosis not present

## 2018-03-08 DIAGNOSIS — N401 Enlarged prostate with lower urinary tract symptoms: Secondary | ICD-10-CM | POA: Diagnosis not present

## 2018-03-08 DIAGNOSIS — Z466 Encounter for fitting and adjustment of urinary device: Secondary | ICD-10-CM | POA: Diagnosis not present

## 2018-03-08 DIAGNOSIS — R338 Other retention of urine: Secondary | ICD-10-CM | POA: Diagnosis not present

## 2018-03-08 DIAGNOSIS — N323 Diverticulum of bladder: Secondary | ICD-10-CM | POA: Diagnosis not present

## 2018-03-08 DIAGNOSIS — E340 Carcinoid syndrome: Secondary | ICD-10-CM | POA: Diagnosis not present

## 2018-03-08 LAB — CHROMOGRANIN A: CHROMOGRANIN A: 209 nmol/L — AB (ref 0–5)

## 2018-03-08 NOTE — Progress Notes (Signed)
77 year old male diagnosed with metastatic neuroendocrine tumor to his liver.  He is a patient of Dr. Benay Spice.  Past medical history includes anemia.  Medications include multivitamin, K. Dur, saw palmetto, and Sandostatin.  Labs were reviewed.  Height: 6 feet 4 inches. Weight: 205.4 pounds. Usual body weight: 250 pounds in September 2018. BMI: 25.0.  Patient reports he has a poor appetite but denies other nutrition impact symptoms. Reports family and friends provide him with food. It does not appear as if patient eliminates or refuses foods. He enjoys drinking Ensure but does not have any at home. Patient reports he would be willing to add oral nutrition supplements. Patient has lost 45 pounds over 9 months. Nutrition focused physical exam reveals severe depletion of body fat and muscle mass.  Nutrition diagnosis:  Severe malnutrition related to metastatic neuroendocrine tumor as evidenced by 18% weight loss over 9 months, severe depletion of body fat, and severe depletion of muscle mass.  Intervention: Educated patient on the importance of smaller, more frequent meals and snacks with high-protein foods. Recommended patient consume oral nutrition supplements 3 times daily between meals. Encourage patient to communicate with family and friends to provide feedback on foods he enjoys. Provided 1 complementary case of Ensure Enlive. Provided patient with fact sheets.  Questions were answered.  Teach back method used.  Contact information provided.  Monitoring, evaluation, goals: Patient will tolerate increased calories and protein to minimize further weight loss.  Next visit: To be scheduled as needed.  **Disclaimer: This note was dictated with voice recognition software. Similar sounding words can inadvertently be transcribed and this note may contain transcription errors which may not have been corrected upon publication of note.**

## 2018-03-31 ENCOUNTER — Encounter: Payer: Self-pay | Admitting: Oncology

## 2018-03-31 ENCOUNTER — Telehealth: Payer: Self-pay

## 2018-03-31 ENCOUNTER — Inpatient Hospital Stay: Payer: Medicare Other | Attending: Oncology | Admitting: Oncology

## 2018-03-31 ENCOUNTER — Inpatient Hospital Stay: Payer: Medicare Other

## 2018-03-31 VITALS — BP 133/83 | HR 89 | Temp 98.2°F | Resp 18 | Ht 76.0 in | Wt 204.5 lb

## 2018-03-31 DIAGNOSIS — R197 Diarrhea, unspecified: Secondary | ICD-10-CM | POA: Diagnosis not present

## 2018-03-31 DIAGNOSIS — C7B8 Other secondary neuroendocrine tumors: Secondary | ICD-10-CM | POA: Diagnosis not present

## 2018-03-31 DIAGNOSIS — R634 Abnormal weight loss: Secondary | ICD-10-CM

## 2018-03-31 DIAGNOSIS — C7B02 Secondary carcinoid tumors of liver: Secondary | ICD-10-CM | POA: Diagnosis not present

## 2018-03-31 DIAGNOSIS — R630 Anorexia: Secondary | ICD-10-CM | POA: Insufficient documentation

## 2018-03-31 LAB — CMP (CANCER CENTER ONLY)
ALT: 24 U/L (ref 0–44)
ANION GAP: 11 (ref 5–15)
AST: 29 U/L (ref 15–41)
Albumin: 3.4 g/dL — ABNORMAL LOW (ref 3.5–5.0)
Alkaline Phosphatase: 162 U/L — ABNORMAL HIGH (ref 38–126)
BUN: 10 mg/dL (ref 8–23)
CHLORIDE: 99 mmol/L (ref 98–111)
CO2: 31 mmol/L (ref 22–32)
CREATININE: 0.98 mg/dL (ref 0.61–1.24)
Calcium: 9.6 mg/dL (ref 8.9–10.3)
Glucose, Bld: 157 mg/dL — ABNORMAL HIGH (ref 70–99)
POTASSIUM: 3.1 mmol/L — AB (ref 3.5–5.1)
SODIUM: 141 mmol/L (ref 135–145)
Total Bilirubin: 0.7 mg/dL (ref 0.3–1.2)
Total Protein: 7.7 g/dL (ref 6.5–8.1)

## 2018-03-31 LAB — CBC WITH DIFFERENTIAL (CANCER CENTER ONLY)
Basophils Absolute: 0 10*3/uL (ref 0.0–0.1)
Basophils Relative: 0 %
Eosinophils Absolute: 0.1 10*3/uL (ref 0.0–0.5)
Eosinophils Relative: 3 %
HCT: 37.6 % — ABNORMAL LOW (ref 38.4–49.9)
HEMOGLOBIN: 12.8 g/dL — AB (ref 13.0–17.1)
LYMPHS ABS: 1 10*3/uL (ref 0.9–3.3)
LYMPHS PCT: 31 %
MCH: 25.7 pg — AB (ref 27.2–33.4)
MCHC: 34 g/dL (ref 32.0–36.0)
MCV: 75.4 fL — AB (ref 79.3–98.0)
MONOS PCT: 9 %
Monocytes Absolute: 0.3 10*3/uL (ref 0.1–0.9)
NEUTROS ABS: 1.9 10*3/uL (ref 1.5–6.5)
NEUTROS PCT: 57 %
Platelet Count: 138 10*3/uL — ABNORMAL LOW (ref 140–400)
RBC: 4.99 MIL/uL (ref 4.20–5.82)
RDW: 17.2 % — ABNORMAL HIGH (ref 11.0–14.6)
WBC: 3.3 10*3/uL — AB (ref 4.0–10.3)

## 2018-03-31 LAB — MAGNESIUM: MAGNESIUM: 1.9 mg/dL (ref 1.7–2.4)

## 2018-03-31 MED ORDER — POTASSIUM CHLORIDE ER 10 MEQ PO TBCR: 10.0000 meq | EXTENDED_RELEASE_TABLET | Freq: Every day | ORAL | 1 refills | Status: DC

## 2018-03-31 MED ORDER — OCTREOTIDE ACETATE 30 MG IM KIT
PACK | INTRAMUSCULAR | Status: AC
  Filled 2018-03-31: qty 1

## 2018-03-31 MED ORDER — OCTREOTIDE ACETATE 30 MG IM KIT
30.0000 mg | PACK | Freq: Once | INTRAMUSCULAR | Status: AC
  Administered 2018-03-31: 30 mg via INTRAMUSCULAR

## 2018-03-31 NOTE — Patient Instructions (Signed)

## 2018-03-31 NOTE — Progress Notes (Signed)
  [place] OFFICE PROGRESS NOTE   Diagnosis: Carcinoid tumor  INTERVAL HISTORY:   Charles Schmidt returns as scheduled.  He completed a first treatment with Lutathera on 03/03/2018.  He reports tolerating the treatment well.  No diarrhea.  He is not taking the potassium supplement.  He is passing urine without difficulty.  Objective:  Vital signs in last 24 hours:  Blood pressure 133/83, pulse 89, temperature 98.2 F (36.8 C), temperature source Oral, resp. rate 18, height '6\' 4"'$  (1.93 m), weight 204 lb 8 oz (92.8 kg), SpO2 100 %.    HEENT: No thrush or ulcers Resp: Lungs clear bilaterally Cardio: Regular rate and rhythm GI: The liver is palpable throughout the abdomen extending to the left lower abdomen Vascular: No leg edema      Lab Results:  Lab Results  Component Value Date   WBC 3.3 (L) 03/31/2018   HGB 12.8 (L) 03/31/2018   HCT 37.6 (L) 03/31/2018   MCV 75.4 (L) 03/31/2018   PLT 138 (L) 03/31/2018   NEUTROABS 1.9 03/31/2018    CMP  Lab Results  Component Value Date   NA 141 03/31/2018   K 3.1 (L) 03/31/2018   CL 99 03/31/2018   CO2 31 03/31/2018   GLUCOSE 157 (H) 03/31/2018   BUN 10 03/31/2018   CREATININE 0.98 03/31/2018   CALCIUM 9.6 03/31/2018   PROT 7.7 03/31/2018   ALBUMIN 3.4 (L) 03/31/2018   AST 29 03/31/2018   ALT 24 03/31/2018   ALKPHOS 162 (H) 03/31/2018   BILITOT 0.7 03/31/2018   GFRNONAA >60 03/31/2018   GFRAA >60 03/31/2018    Medications: I have reviewed the patient's current medications.   Assessment/Plan: 1.Metastatic carcinoid tumor, biopsy of a liver lesion 10/23/2017 consistent with a well-differentiated neuroendocrine neoplasm, WHO grade 2,ki-6710%  CT abdomen/pelvis 10/13/2017-extensive liver metastases, cirrhosis, soft tissue mass in the small bowel mesentery versus a primary small bowel tumor, tiny lucent lesions in the pelvic bones  Elevated chromogranin A and 24-hour urine 5-HIAA  CT chest  11/24/2017-subpleural nodule in the left lower lobe with associated radiotracer activity on comparison DOTATATEPET scan. No additional evidence of thoracic metastasis.  DOTATATE PET scan3/01/2018-intense radiotracer accumulation with innumerable confluent hepatic metastasis; intense radiotracer activity within central mesenteric mass; 2 foci of uptake associated with the small bowel; intense radiotracer activity associated periaortic and paraspinal lymph nodes; more distant solitary small metastasis within the pleural space left lower lobe.  Monthly Sandostatin initiated 11/26/2017, dose increased to 30 mg 01/21/2018  Cycle 1 Lutathera on 03/03/2018, monthly Sandostatin continued  2.Diarrhea-likely secondary to carcinoid syndrome, improved with Sandostatin 3.Anorexia/weight loss 4.Prostatic hypertrophy; status post laser vaporization 01/05/2018   Disposition: Charles Schmidt appears stable.  His overall status has improved since beginning Sandostatin.  He will receive Sandostatin today.  He is scheduled for a second treatment with Lutathera on 04/27/2018.  He will return for an office visit and Sandostatin on 05/25/2018.  He will resume a potassium supplement.  15 minutes were spent with the patient today.  The majority of the time was used for counseling and coordination of care.  Betsy Coder, MD  03/31/2018  11:32 AM

## 2018-03-31 NOTE — Telephone Encounter (Signed)
Printed avs and calender of upcoming appointment. Per 7/10 los.  

## 2018-04-16 DIAGNOSIS — H409 Unspecified glaucoma: Secondary | ICD-10-CM | POA: Diagnosis not present

## 2018-04-16 DIAGNOSIS — H2513 Age-related nuclear cataract, bilateral: Secondary | ICD-10-CM | POA: Diagnosis not present

## 2018-04-23 DIAGNOSIS — H401122 Primary open-angle glaucoma, left eye, moderate stage: Secondary | ICD-10-CM | POA: Diagnosis not present

## 2018-04-27 ENCOUNTER — Encounter (HOSPITAL_COMMUNITY)
Admission: RE | Admit: 2018-04-27 | Discharge: 2018-04-27 | Disposition: A | Discharge status: Home / Self Care | Payer: Medicare Other | Source: Ambulatory Visit | Attending: Nurse Practitioner | Admitting: Nurse Practitioner

## 2018-04-27 DIAGNOSIS — C7B8 Other secondary neuroendocrine tumors: Secondary | ICD-10-CM | POA: Diagnosis not present

## 2018-04-27 DIAGNOSIS — C7A1 Malignant poorly differentiated neuroendocrine tumors: Secondary | ICD-10-CM | POA: Diagnosis not present

## 2018-04-27 LAB — COMPREHENSIVE METABOLIC PANEL
ALK PHOS: 167 U/L — AB (ref 38–126)
ALT: 28 U/L (ref 0–44)
AST: 42 U/L — ABNORMAL HIGH (ref 15–41)
Albumin: 3.5 g/dL (ref 3.5–5.0)
Anion gap: 9 (ref 5–15)
BUN: 17 mg/dL (ref 8–23)
CALCIUM: 9.2 mg/dL (ref 8.9–10.3)
CO2: 28 mmol/L (ref 22–32)
CREATININE: 0.93 mg/dL (ref 0.61–1.24)
Chloride: 104 mmol/L (ref 98–111)
Glucose, Bld: 143 mg/dL — ABNORMAL HIGH (ref 70–99)
Potassium: 3.8 mmol/L (ref 3.5–5.1)
Sodium: 141 mmol/L (ref 135–145)
TOTAL PROTEIN: 7.6 g/dL (ref 6.5–8.1)
Total Bilirubin: 0.6 mg/dL (ref 0.3–1.2)

## 2018-04-27 LAB — CBC WITH DIFFERENTIAL/PLATELET
Basophils Absolute: 0 10*3/uL (ref 0.0–0.1)
Basophils Relative: 0 %
Eosinophils Absolute: 0.1 10*3/uL (ref 0.0–0.7)
Eosinophils Relative: 2 %
HCT: 37 % — ABNORMAL LOW (ref 39.0–52.0)
HEMOGLOBIN: 12.5 g/dL — AB (ref 13.0–17.0)
LYMPHS ABS: 0.9 10*3/uL (ref 0.7–4.0)
Lymphocytes Relative: 28 %
MCH: 25.6 pg — AB (ref 26.0–34.0)
MCHC: 33.8 g/dL (ref 30.0–36.0)
MCV: 75.7 fL — ABNORMAL LOW (ref 78.0–100.0)
Monocytes Absolute: 0.2 10*3/uL (ref 0.1–1.0)
Monocytes Relative: 6 %
NEUTROS PCT: 64 %
Neutro Abs: 2.1 10*3/uL (ref 1.7–7.7)
PLATELETS: 166 10*3/uL (ref 150–400)
RBC: 4.89 MIL/uL (ref 4.22–5.81)
RDW: 16.9 % — ABNORMAL HIGH (ref 11.5–15.5)
WBC: 3.3 10*3/uL — AB (ref 4.0–10.5)

## 2018-04-27 MED ORDER — SODIUM CHLORIDE 0.9 % IV SOLN: 500.0000 mL | Freq: Once | INTRAVENOUS | Status: DC

## 2018-04-27 MED ORDER — LUTETIUM LU 177 DOTATATE 370 MBQ/ML IV SOLN
200.0000 | Freq: Once | INTRAVENOUS | Status: AC
  Administered 2018-04-27: 204.02 via INTRAVENOUS

## 2018-04-27 MED ORDER — ONDANSETRON HCL 8 MG PO TABS: 8.0000 mg | ORAL_TABLET | Freq: Two times a day (BID) | ORAL | 0 refills | Status: DC | PRN

## 2018-04-27 MED ORDER — SODIUM CHLORIDE 0.9 % IV SOLN
8.0000 mg | Freq: Once | INTRAVENOUS | Status: AC
  Administered 2018-04-27: 8 mg via INTRAVENOUS
  Filled 2018-04-27: qty 4

## 2018-04-27 MED ORDER — OCTREOTIDE ACETATE 500 MCG/ML IJ SOLN
INTRAMUSCULAR | Status: AC
  Filled 2018-04-27: qty 1

## 2018-04-27 MED ORDER — OCTREOTIDE ACETATE 500 MCG/ML IJ SOLN: 500.0000 ug | Freq: Once | INTRAMUSCULAR | Status: DC | PRN

## 2018-04-27 MED ORDER — PROCHLORPERAZINE EDISYLATE 10 MG/2ML IJ SOLN
10.0000 mg | Freq: Four times a day (QID) | INTRAMUSCULAR | Status: DC | PRN
  Filled 2018-04-27: qty 2

## 2018-04-27 MED ORDER — OCTREOTIDE ACETATE 30 MG IM KIT
30.0000 mg | PACK | Freq: Once | INTRAMUSCULAR | Status: AC
  Administered 2018-04-27: 30 mg via INTRAMUSCULAR

## 2018-04-27 MED ORDER — AMINO ACID RADIOPROTECTANT - L-LYSINE 2.5%/L-ARGININE 2.5% IN NS
250.0000 mL/h | INTRAVENOUS | Status: AC
  Administered 2018-04-27: 250 mL/h via INTRAVENOUS
  Filled 2018-04-27: qty 1000

## 2018-04-27 MED ORDER — OCTREOTIDE ACETATE 30 MG IM KIT
PACK | INTRAMUSCULAR | Status: AC
  Filled 2018-04-27: qty 1

## 2018-04-27 NOTE — Progress Notes (Signed)
RADIOPHARMACEUTICALS:  [204] mCi Lu 177 DOTATATE  FINDINGS: Diagnosis: [Metastatic neuroendocrine tumor.]  Current Infusion: [Second]  Planned Infusions: [4]  Patient reports [minimal] interval symptoms following therapy. Thepatient's most recent blood counts were reviewed and remains a good candidate toproceed with Lutathera.  Minimal diarrhea.  Mild anemia and leukopenia are noted in similar to pre therapy lab values.  Normal platelets.  Hepatic function and renal function normal.   The patient was situated in an infusion suite and administered Lutathera as above. Patient will follow-up with referring oncologist for interval serum laboratories (CBC and CMP) in approximately 4 weeks.   Patient received 30 mg IM long-acting Sandostatin injection 4 hours after Lutathera effusion in the nuclear medicine department.   IMPRESSION: [Second] MB 014 [id number] treatment for metastatic neuroendocrine tumor. The patient tolerated the infusion well. The patient will return in 8 weeks for ongoing care.

## 2018-04-29 DIAGNOSIS — I10 Essential (primary) hypertension: Secondary | ICD-10-CM | POA: Diagnosis not present

## 2018-04-29 DIAGNOSIS — R7303 Prediabetes: Secondary | ICD-10-CM | POA: Diagnosis not present

## 2018-04-29 DIAGNOSIS — L309 Dermatitis, unspecified: Secondary | ICD-10-CM | POA: Diagnosis not present

## 2018-04-29 DIAGNOSIS — L409 Psoriasis, unspecified: Secondary | ICD-10-CM | POA: Diagnosis not present

## 2018-04-29 DIAGNOSIS — I119 Hypertensive heart disease without heart failure: Secondary | ICD-10-CM | POA: Diagnosis not present

## 2018-05-01 ENCOUNTER — Encounter (HOSPITAL_COMMUNITY): Payer: Self-pay | Admitting: *Deleted

## 2018-05-01 ENCOUNTER — Ambulatory Visit (HOSPITAL_COMMUNITY)
Admission: EM | Admit: 2018-05-01 | Discharge: 2018-05-01 | Disposition: A | Discharge status: Home / Self Care | Payer: Medicare Other | Attending: Internal Medicine | Admitting: Internal Medicine

## 2018-05-01 DIAGNOSIS — I10 Essential (primary) hypertension: Secondary | ICD-10-CM | POA: Diagnosis not present

## 2018-05-01 DIAGNOSIS — Z760 Encounter for issue of repeat prescription: Secondary | ICD-10-CM

## 2018-05-01 MED ORDER — AMLODIPINE BESYLATE 5 MG PO TABS: 5.0000 mg | ORAL_TABLET | Freq: Every day | ORAL | 0 refills | Status: DC

## 2018-05-01 MED ORDER — HYDROCHLOROTHIAZIDE 25 MG PO TABS: 25.0000 mg | ORAL_TABLET | Freq: Every day | ORAL | 0 refills | Status: DC

## 2018-05-01 NOTE — ED Triage Notes (Signed)
Pt reports needing refills of HCTZ 25mg  and amlodipine 5mg .

## 2018-05-01 NOTE — ED Provider Notes (Signed)
[facility]    CSN: [id number] Arrival date & time: 05/01/18  1626     History   Chief Complaint Chief Complaint  Patient presents with  . Medication Refill    HPI Charles Schmidt is a 77 y.o. male.   Patient presents for refull of HTN medications.  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  Education given patient on obtaining primary care provider.     Past Medical History:  Diagnosis Date  . BPH with obstruction/lower urinary tract symptoms   . Cataract, mature    12-28-2017  per pt right eye, legally blind  . Diarrhea concurrent with and due to carcinoid syndrome ([place])    12-28-2017 improvement since started Sandostatin monthly  . ED (erectile dysfunction)   . Elevated PSA   . Family history of cancer in son   . Family history of prostate cancer   . Foley catheter in place   . Glaucoma, left eye   . History of colon polyps   . History of urinary retention    2015; 2016; 01/ 2019  . Hypertension   . Metastatic malignant neuroendocrine tumor to liver [facility]) oncoloigst-  dr Benay Spice   dx 10-23-2017 by liver bx-- WHO grade 2; small bowel mesenteric mass, cirrhosis, mets left lower lobe solitary nodule ,  periaortic and paraspinal lymph nodes METS--  started monthly Sandostatin 11-20-2017  . Microcytic anemia   . RBBB (right bundle branch block with left anterior fascicular block)   . Urinary retention   . Venous reflux     Patient Active Problem List   Diagnosis Date Noted  . Genetic testing 12/18/2017  . Family history of cancer in son   . Family history of prostate cancer   . Metastatic malignant neuroendocrine tumor to liver ([place]) 10/28/2017  . Essential hypertension (primary) 05/04/2017  . Hypokalemia 05/04/2017  . Benign prostatic hyperplasia 05/04/2017  . Glaucoma 05/04/2017  . Benign hypertensive heart disease without heart failure 05/04/2017  . Microcytic anemia 05/04/2017  . Prediabetes 05/04/2017  . Hypocalcemia  05/04/2017  . Psoriasis 05/04/2017    Past Surgical History:  Procedure Laterality Date  . INGUINAL HERNIA REPAIR  1990s   unsure side  . IR US GUIDE BX ASP/DRAIN  10/23/2017  . QUADRICEPS TENDON REPAIR Left 01-08-2011   dr Veverly Fells  . THULIUM LASER TURP (TRANSURETHRAL RESECTION OF PROSTATE) N/A 01/05/2018   Procedure: Marcelino Duster LASER OF THE PROSTATE;  Surgeon: Festus Aloe, MD;  Location: [facility];  Service: Urology;  Laterality: N/A;  . TONSILLECTOMY  child       Home Medications    Prior to Admission medications   Medication Sig Start Date End Date Taking? Authorizing Provider  amLODipine (NORVASC) 5 MG tablet Take 5 mg by mouth at bedtime.    Yes [provider]  dorzolamide-timolol (COSOPT) 22.3-6.8 MG/ML ophthalmic solution Place 1 drop into the left eye 2 (two) times daily.    Yes [provider]  finasteride (PROSCAR) 5 MG tablet Take 5 mg by mouth at bedtime.    Yes [provider]  hydrochlorothiazide (HYDRODIURIL) 25 MG tablet Take 25 mg by mouth at bedtime.    Yes [provider]  potassium chloride (K-DUR) 10 MEQ tablet Take 1 tablet (10 mEq total) by mouth at bedtime. 03/31/18  Yes Ladell Pier, MD  Saw Palmetto, Serenoa repens, (SAW PALMETTO PO) Take 1 tablet by mouth at bedtime.    Yes [provider]  tamsulosin (FLOMAX) 0.4 MG CAPS capsule Take 1 capsule (0.4 mg total) by mouth daily after supper. Patient taking differently: Take 0.4 mg by mouth at bedtime.  10/22/14  Yes [name], MD  Travoprost, BAK Free, (TRAVATAN) 0.004 % SOLN ophthalmic solution Place 1 drop into the left eye at bedtime.    Yes [provider]  Multiple Vitamin (MULTIVITAMIN WITH MINERALS) TABS tablet Take 1 tablet by mouth at bedtime. Centrum Silver    [provider]  ondansetron (ZOFRAN) 8 MG tablet Take 1 tablet (8 mg total) by mouth 2 (two) times daily as needed for nausea or vomiting. 04/27/18   Gus Height, MD    Family History Family History  Problem Relation Age of Onset  . Hypertension Mother   . Stroke Father   . Prostate cancer Father        'slow growing'  . Kidney Stones Sister   . Cancer Son 71       neuroendocrine tumors of small intestine    Social History Social History   Tobacco Use  . Smoking status: Never Smoker  . Smokeless tobacco: Never Used  Substance Use Topics  . Alcohol use: Not Currently    Alcohol/week: 1.0 standard drinks    Types: 1 Standard drinks or equivalent per week    Comment: week  . Drug use: No     Allergies   Patient has no known allergies.   Review of Systems Review of Systems   Physical Exam Triage Vital Signs ED Triage Vitals  Enc Vitals Group     BP 05/01/18 1731 (!) 145/84     Pulse Rate 05/01/18 1731 67     Resp 05/01/18 1731 16     Temp 05/01/18 1731 97.7 F (36.5 C)     Temp Source 05/01/18 1731 Oral     SpO2 05/01/18 1731 100 %     Weight --      Height --      Head Circumference --      Peak Flow --      Pain Score 05/01/18 1732 0     Pain Loc --      Pain Edu? --      Excl. in [place]? --    No data found.  Updated Vital Signs BP (!) 145/84   Pulse 67   Temp 97.7 F (36.5 C) (Oral)   Resp 16   SpO2 100%   Visual Acuity Right Eye Distance:   Left Eye Distance:   Bilateral Distance:    Right Eye Near:   Left Eye Near:    Bilateral Near:     Physical Exam   UC Treatments / Results  Labs (all labs ordered are listed, but only abnormal results are displayed) Labs Reviewed - No data to display  EKG None  Radiology No results found.  Procedures Procedures (including critical care time)  Medications Ordered in UC Medications - No data to display  Initial Impression / Assessment and Plan / UC Course  I have reviewed the triage vital signs and the nursing notes.  Pertinent labs & imaging results that were available during my care of the patient were reviewed by me and considered in my  medical decision making (see chart for details).      Final Clinical Impressions(s) / UC Diagnoses   Final diagnoses:  None   Discharge Instructions   None    ED Prescriptions    None     Controlled  Substance Prescriptions [place] Controlled Substance Registry consulted? Not Applicable   Jacqualine Mau, NP 05/01/18 1746

## 2018-05-21 ENCOUNTER — Other Ambulatory Visit: Payer: Self-pay | Admitting: Nurse Practitioner

## 2018-05-21 DIAGNOSIS — C7A098 Malignant carcinoid tumors of other sites: Secondary | ICD-10-CM

## 2018-05-25 ENCOUNTER — Inpatient Hospital Stay: Payer: Medicare Other | Attending: Oncology

## 2018-05-25 ENCOUNTER — Encounter: Payer: Self-pay | Admitting: Nurse Practitioner

## 2018-05-25 ENCOUNTER — Inpatient Hospital Stay: Payer: Medicare Other

## 2018-05-25 ENCOUNTER — Telehealth: Payer: Self-pay

## 2018-05-25 ENCOUNTER — Inpatient Hospital Stay (HOSPITAL_BASED_OUTPATIENT_CLINIC_OR_DEPARTMENT_OTHER): Payer: Medicare Other | Admitting: Nurse Practitioner

## 2018-05-25 VITALS — BP 130/82 | HR 71 | Temp 98.1°F | Resp 18 | Ht 76.0 in | Wt 203.2 lb

## 2018-05-25 DIAGNOSIS — C7B8 Other secondary neuroendocrine tumors: Secondary | ICD-10-CM

## 2018-05-25 DIAGNOSIS — C7B02 Secondary carcinoid tumors of liver: Secondary | ICD-10-CM

## 2018-05-25 LAB — CMP (CANCER CENTER ONLY)
ALBUMIN: 3.4 g/dL — AB (ref 3.5–5.0)
ALK PHOS: 129 U/L — AB (ref 38–126)
ALT: 14 U/L (ref 0–44)
ANION GAP: 9 (ref 5–15)
AST: 23 U/L (ref 15–41)
BUN: 15 mg/dL (ref 8–23)
CALCIUM: 9.5 mg/dL (ref 8.9–10.3)
CO2: 27 mmol/L (ref 22–32)
Chloride: 103 mmol/L (ref 98–111)
Creatinine: 0.94 mg/dL (ref 0.61–1.24)
GFR, Estimated: >60 mL/min (ref >60)
Glucose, Bld: 132 mg/dL — ABNORMAL HIGH (ref 70–99)
POTASSIUM: 3.4 mmol/L — AB (ref 3.5–5.1)
Sodium: 139 mmol/L (ref 135–145)
Total Bilirubin: 0.5 mg/dL (ref 0.3–1.2)
Total Protein: 7.5 g/dL (ref 6.5–8.1)

## 2018-05-25 LAB — CBC WITH DIFFERENTIAL (CANCER CENTER ONLY)
Basophils Absolute: 0 10*3/uL (ref 0.0–0.1)
Basophils Relative: 0 %
Eosinophils Absolute: 0.1 10*3/uL (ref 0.0–0.5)
Eosinophils Relative: 2 %
HEMATOCRIT: 36.9 % — AB (ref 38.4–49.9)
HEMOGLOBIN: 12.5 g/dL — AB (ref 13.0–17.1)
LYMPHS PCT: 28 %
Lymphs Abs: 0.8 10*3/uL — ABNORMAL LOW (ref 0.9–3.3)
MCH: 25.6 pg — AB (ref 27.2–33.4)
MCHC: 33.9 g/dL (ref 32.0–36.0)
MCV: 75.6 fL — AB (ref 79.3–98.0)
Monocytes Absolute: 0.3 10*3/uL (ref 0.1–0.9)
Monocytes Relative: 9 %
NEUTROS ABS: 1.7 10*3/uL (ref 1.5–6.5)
NEUTROS PCT: 61 %
Platelet Count: 137 10*3/uL — ABNORMAL LOW (ref 140–400)
RBC: 4.88 MIL/uL (ref 4.20–5.82)
RDW: 16.2 % — AB (ref 11.0–14.6)
WBC Count: 2.8 10*3/uL — ABNORMAL LOW (ref 4.0–10.3)

## 2018-05-25 MED ORDER — OCTREOTIDE ACETATE 30 MG IM KIT
PACK | INTRAMUSCULAR | Status: AC
  Filled 2018-05-25: qty 1

## 2018-05-25 MED ORDER — OCTREOTIDE ACETATE 30 MG IM KIT
30.0000 mg | PACK | Freq: Once | INTRAMUSCULAR | Status: AC
  Administered 2018-05-25: 30 mg via INTRAMUSCULAR

## 2018-05-25 NOTE — Progress Notes (Signed)
  [place] OFFICE PROGRESS NOTE   Diagnosis: Carcinoid tumor  INTERVAL HISTORY:   Charles Schmidt returns as scheduled.  He completed a second treatment with Lutathera 04/27/2018.  He denies nausea/vomiting.  No mouth sores.  No diarrhea.  Objective:  Vital signs in last 24 hours:  Blood pressure 130/82, pulse 71, temperature 98.1 F (36.7 C), temperature source Oral, resp. rate 18, height 6' 4" (1.93 m), weight 203 lb 3.2 oz (92.2 kg), SpO2 100 %.    HEENT: No thrush or ulcers. Resp: Lungs clear bilaterally. Cardio: Regular rate and rhythm. GI: Liver is palpable throughout the upper abdomen. Vascular: No leg edema.   Lab Results:  Lab Results  Component Value Date   WBC 2.8 (L) 05/25/2018   HGB 12.5 (L) 05/25/2018   HCT 36.9 (L) 05/25/2018   MCV 75.6 (L) 05/25/2018   PLT 137 (L) 05/25/2018   NEUTROABS 1.7 05/25/2018    Imaging:  No results found.  Medications: I have reviewed the patient's current medications.  Assessment/Plan: 1.Metastatic carcinoid tumor, biopsy of a liver lesion 10/23/2017 consistent with a well-differentiated neuroendocrine neoplasm, WHO grade 2,ki-6710%  CT abdomen/pelvis 10/13/2017-extensive liver metastases, cirrhosis, soft tissue mass in the small bowel mesentery versus a primary small bowel tumor, tiny lucent lesions in the pelvic bones  Elevated chromogranin A and 24-hour urine 5-HIAA  CT chest 11/24/2017-subpleural nodule in the left lower lobe with associated radiotracer activity on comparison DOTATATEPET scan. No additional evidence of thoracic metastasis.  DOTATATE PET scan3/01/2018-intense radiotracer accumulation with innumerable confluent hepatic metastasis; intense radiotracer activity within central mesenteric mass; 2 foci of uptake associated with the small bowel; intense radiotracer activity associated periaortic and paraspinal lymph nodes; more distant solitary small metastasis within the pleural space left lower  lobe.  Monthly Sandostatin initiated 11/26/2017, dose increased to 30 mg 01/21/2018  Cycle 1 Lutathera 03/03/2018, monthly Sandostatin continued  Cycle 2 Lutathera 04/27/2018, monthly Sandostatin continued  2.Diarrhea-likely secondary to carcinoid syndrome, improved with Sandostatin 3.Anorexia/weight loss 4.Prostatic hypertrophy;status post laser vaporization 01/05/2018  Disposition: [name] has completed 2 treatments of Lutathera.  His performance status has improved.  He is scheduled for cycle 3 Lutathera 06/23/2018.  He will receive a Sandostatin injection today.  He will return for lab, follow-up and Sandostatin in 2 months.  He will contact the office in the interim with any problems.    Drayden Lukas ANP/GNP-BC   05/25/2018  11:54 AM

## 2018-05-25 NOTE — Telephone Encounter (Signed)
Printed avs and calender of upcoming appointment. Per 9/3 los

## 2018-05-25 NOTE — Patient Instructions (Signed)

## 2018-05-31 DIAGNOSIS — E340 Carcinoid syndrome: Secondary | ICD-10-CM | POA: Diagnosis not present

## 2018-05-31 DIAGNOSIS — I10 Essential (primary) hypertension: Secondary | ICD-10-CM | POA: Diagnosis not present

## 2018-05-31 DIAGNOSIS — E876 Hypokalemia: Secondary | ICD-10-CM | POA: Diagnosis not present

## 2018-05-31 DIAGNOSIS — Z13220 Encounter for screening for lipoid disorders: Secondary | ICD-10-CM | POA: Diagnosis not present

## 2018-05-31 DIAGNOSIS — Z131 Encounter for screening for diabetes mellitus: Secondary | ICD-10-CM | POA: Diagnosis not present

## 2018-06-14 ENCOUNTER — Other Ambulatory Visit: Payer: Self-pay | Admitting: Nurse Practitioner

## 2018-06-14 DIAGNOSIS — I10 Essential (primary) hypertension: Secondary | ICD-10-CM | POA: Diagnosis not present

## 2018-06-14 DIAGNOSIS — C7A8 Other malignant neuroendocrine tumors: Secondary | ICD-10-CM

## 2018-06-14 DIAGNOSIS — E340 Carcinoid syndrome: Secondary | ICD-10-CM | POA: Diagnosis not present

## 2018-06-14 DIAGNOSIS — R270 Ataxia, unspecified: Secondary | ICD-10-CM | POA: Diagnosis not present

## 2018-06-23 ENCOUNTER — Encounter (HOSPITAL_COMMUNITY)
Admission: RE | Admit: 2018-06-23 | Discharge: 2018-06-23 | Disposition: A | Discharge status: Home / Self Care | Payer: Medicare Other | Source: Ambulatory Visit | Attending: Nurse Practitioner | Admitting: Nurse Practitioner

## 2018-06-23 DIAGNOSIS — C7A098 Malignant carcinoid tumors of other sites: Secondary | ICD-10-CM | POA: Insufficient documentation

## 2018-06-23 DIAGNOSIS — C7A1 Malignant poorly differentiated neuroendocrine tumors: Secondary | ICD-10-CM | POA: Diagnosis not present

## 2018-06-23 LAB — COMPREHENSIVE METABOLIC PANEL
ALK PHOS: 146 U/L — AB (ref 38–126)
ALT: 23 U/L (ref 0–44)
AST: 35 U/L (ref 15–41)
Albumin: 3.7 g/dL (ref 3.5–5.0)
Anion gap: 11 (ref 5–15)
BUN: 14 mg/dL (ref 8–23)
CALCIUM: 9.7 mg/dL (ref 8.9–10.3)
CO2: 27 mmol/L (ref 22–32)
CREATININE: 0.86 mg/dL (ref 0.61–1.24)
Chloride: 104 mmol/L (ref 98–111)
GFR calc non Af Amer: >60 mL/min (ref >60)
Glucose, Bld: 122 mg/dL — ABNORMAL HIGH (ref 70–99)
Potassium: 3.7 mmol/L (ref 3.5–5.1)
SODIUM: 142 mmol/L (ref 135–145)
Total Bilirubin: 0.5 mg/dL (ref 0.3–1.2)
Total Protein: 7.8 g/dL (ref 6.5–8.1)

## 2018-06-23 LAB — CBC
HCT: 36.6 % — ABNORMAL LOW (ref 39.0–52.0)
HEMOGLOBIN: 12.4 g/dL — AB (ref 13.0–17.0)
MCH: 25.4 pg — AB (ref 26.0–34.0)
MCHC: 33.9 g/dL (ref 30.0–36.0)
MCV: 75 fL — ABNORMAL LOW (ref 78.0–100.0)
Platelets: 165 10*3/uL (ref 150–400)
RBC: 4.88 MIL/uL (ref 4.22–5.81)
RDW: 17.2 % — ABNORMAL HIGH (ref 11.5–15.5)
WBC: 2.7 10*3/uL — ABNORMAL LOW (ref 4.0–10.5)

## 2018-06-23 MED ORDER — LUTETIUM LU 177 DOTATATE 370 MBQ/ML IV SOLN
200.0000 | Freq: Once | INTRAVENOUS | Status: AC
  Administered 2018-06-23: 202.5 via INTRAVENOUS

## 2018-06-23 MED ORDER — PROCHLORPERAZINE EDISYLATE 10 MG/2ML IJ SOLN
10.0000 mg | Freq: Four times a day (QID) | INTRAMUSCULAR | Status: DC | PRN
  Filled 2018-06-23: qty 2

## 2018-06-23 MED ORDER — OCTREOTIDE ACETATE 30 MG IM KIT
PACK | INTRAMUSCULAR | Status: AC
  Filled 2018-06-23: qty 1

## 2018-06-23 MED ORDER — OCTREOTIDE ACETATE 500 MCG/ML IJ SOLN: 500.0000 ug | Freq: Once | INTRAMUSCULAR | Status: DC | PRN

## 2018-06-23 MED ORDER — OCTREOTIDE ACETATE 30 MG IM KIT
30.0000 mg | PACK | Freq: Once | INTRAMUSCULAR | Status: AC
  Administered 2018-06-23: 30 mg via INTRAMUSCULAR

## 2018-06-23 MED ORDER — SODIUM CHLORIDE 0.9 % IV SOLN: 500.0000 mL | Freq: Once | INTRAVENOUS | Status: DC

## 2018-06-23 MED ORDER — OCTREOTIDE ACETATE 500 MCG/ML IJ SOLN
INTRAMUSCULAR | Status: AC
  Filled 2018-06-23: qty 1

## 2018-06-23 MED ORDER — SODIUM CHLORIDE 0.9 % IV SOLN
8.0000 mg | Freq: Once | INTRAVENOUS | Status: AC
  Administered 2018-06-23: 8 mg via INTRAVENOUS
  Filled 2018-06-23: qty 4

## 2018-06-23 MED ORDER — AMINO ACID RADIOPROTECTANT - L-LYSINE 2.5%/L-ARGININE 2.5% IN NS
250.0000 mL/h | INTRAVENOUS | Status: AC
  Administered 2018-06-23: 250 mL/h via INTRAVENOUS
  Filled 2018-06-23: qty 1000

## 2018-06-23 NOTE — Progress Notes (Signed)
RADIOPHARMACEUTICALS:  [202.5] mCi Lu 177 DOTATATE  FINDINGS: Diagnosis: [Metastatic neuroendocrine tumor.]  Current Infusion: 3  Planned Infusions: [4]  Patient reports [minimal] interval symptoms following therapy.  Patient reports that he is "feeling better".  Some fatigue.    The patient's most recent blood counts were reviewed and remains a good candidate to proceed with Lutathera.  Patient's white count is mildly depressed at 2.7 K. The patient remains mildly anemic (hemoglobin equal 12.4) similar to pre therapy values.    The patient was situated in an infusion suite and administered Lutathera as above. Patient will follow-up with referring oncologist for interval serum laboratories (CBC and CMP) in approximately 4 weeks.    Patient received 30 mg IM long-acting Sandostatin injection 4 hours after Lutathera effusion in the nuclear medicine department.   IMPRESSION: [Third] ZY [id number] treatment for metastatic neuroendocrine tumor. The patient tolerated the infusion well. The patient will return in 8 weeks for ongoing care.

## 2018-06-24 ENCOUNTER — Other Ambulatory Visit (HOSPITAL_COMMUNITY): Payer: Medicare Other

## 2018-07-20 ENCOUNTER — Telehealth: Payer: Self-pay

## 2018-07-20 ENCOUNTER — Inpatient Hospital Stay: Payer: Medicare Other

## 2018-07-20 ENCOUNTER — Inpatient Hospital Stay: Payer: Medicare Other | Attending: Oncology | Admitting: Oncology

## 2018-07-20 VITALS — BP 141/92 | HR 68 | Temp 99.1°F | Resp 19 | Ht 76.0 in | Wt 204.8 lb

## 2018-07-20 DIAGNOSIS — C7B02 Secondary carcinoid tumors of liver: Secondary | ICD-10-CM | POA: Diagnosis not present

## 2018-07-20 DIAGNOSIS — R630 Anorexia: Secondary | ICD-10-CM | POA: Diagnosis not present

## 2018-07-20 DIAGNOSIS — E340 Carcinoid syndrome: Secondary | ICD-10-CM

## 2018-07-20 DIAGNOSIS — C7B8 Other secondary neuroendocrine tumors: Secondary | ICD-10-CM

## 2018-07-20 DIAGNOSIS — R197 Diarrhea, unspecified: Secondary | ICD-10-CM | POA: Insufficient documentation

## 2018-07-20 LAB — CBC WITH DIFFERENTIAL (CANCER CENTER ONLY)
Abs Immature Granulocytes: 0 10*3/uL (ref 0.00–0.07)
BASOS ABS: 0 10*3/uL (ref 0.0–0.1)
BASOS PCT: 0 %
EOS ABS: 0.1 10*3/uL (ref 0.0–0.5)
Eosinophils Relative: 2 %
HEMATOCRIT: 36.1 % — AB (ref 39.0–52.0)
Hemoglobin: 12.1 g/dL — ABNORMAL LOW (ref 13.0–17.0)
IMMATURE GRANULOCYTES: 0 %
LYMPHS ABS: 0.7 10*3/uL (ref 0.7–4.0)
Lymphocytes Relative: 24 %
MCH: 25.7 pg — ABNORMAL LOW (ref 26.0–34.0)
MCHC: 33.5 g/dL (ref 30.0–36.0)
MCV: 76.8 fL — ABNORMAL LOW (ref 80.0–100.0)
Monocytes Absolute: 0.3 10*3/uL (ref 0.1–1.0)
Monocytes Relative: 11 %
NEUTROS PCT: 63 %
NRBC: 0 % (ref 0.0–0.2)
Neutro Abs: 1.8 10*3/uL (ref 1.7–7.7)
PLATELETS: 142 10*3/uL — AB (ref 150–400)
RBC: 4.7 MIL/uL (ref 4.22–5.81)
RDW: 17.4 % — AB (ref 11.5–15.5)
WBC: 2.8 10*3/uL — AB (ref 4.0–10.5)

## 2018-07-20 LAB — CMP (CANCER CENTER ONLY)
ALT: 16 U/L (ref 0–44)
ANION GAP: 9 (ref 5–15)
AST: 24 U/L (ref 15–41)
Albumin: 3.5 g/dL (ref 3.5–5.0)
Alkaline Phosphatase: 123 U/L (ref 38–126)
BUN: 11 mg/dL (ref 8–23)
CHLORIDE: 102 mmol/L (ref 98–111)
CO2: 29 mmol/L (ref 22–32)
Calcium: 9.5 mg/dL (ref 8.9–10.3)
Creatinine: 1.02 mg/dL (ref 0.61–1.24)
Glucose, Bld: 143 mg/dL — ABNORMAL HIGH (ref 70–99)
POTASSIUM: 3.8 mmol/L (ref 3.5–5.1)
Sodium: 140 mmol/L (ref 135–145)
Total Bilirubin: 0.5 mg/dL (ref 0.3–1.2)
Total Protein: 7.8 g/dL (ref 6.5–8.1)

## 2018-07-20 MED ORDER — OCTREOTIDE ACETATE 30 MG IM KIT
30.0000 mg | PACK | Freq: Once | INTRAMUSCULAR | Status: AC
  Administered 2018-07-20: 30 mg via INTRAMUSCULAR

## 2018-07-20 NOTE — Progress Notes (Signed)
  [place] OFFICE PROGRESS NOTE   Diagnosis: Carcinoid tumor  INTERVAL HISTORY:   Charles Schmidt returns for a scheduled visit.  He completed a third treatment with Lutathera on 06/23/2018.  He reports tolerating the treatment well.  No nausea.  He has occasional diarrhea.  No difficulty with urination.  Objective:  Vital signs in last 24 hours:  Blood pressure (!) 141/92, pulse 68, temperature 99.1 F (37.3 C), resp. rate 19, height '6\' 4"'$  (1.93 m), weight 204 lb 12.8 oz (92.9 kg), SpO2 100 %.    HEENT: Neck without mass Lymphatics: Pea-sized left scalene node Resp: Lungs clear bilaterally Cardio: Regular rate and rhythm GI: The liver is palpable throughout the right abdomen extending to the umbilicus and to the left of the midline Vascular: No leg edema Skin: Diffuse dryness Lab Results:  Lab Results  Component Value Date   WBC 2.8 (L) 07/20/2018   HGB 12.1 (L) 07/20/2018   HCT 36.1 (L) 07/20/2018   MCV 76.8 (L) 07/20/2018   PLT 142 (L) 07/20/2018   NEUTROABS 1.8 07/20/2018    CMP  Lab Results  Component Value Date   NA 140 07/20/2018   K 3.8 07/20/2018   CL 102 07/20/2018   CO2 29 07/20/2018   GLUCOSE 143 (H) 07/20/2018   BUN 11 07/20/2018   CREATININE 1.02 07/20/2018   CALCIUM 9.5 07/20/2018   PROT 7.8 07/20/2018   ALBUMIN 3.5 07/20/2018   AST 24 07/20/2018   ALT 16 07/20/2018   ALKPHOS 123 07/20/2018   BILITOT 0.5 07/20/2018   GFRNONAA >60 07/20/2018   GFRAA >60 07/20/2018     Medications: I have reviewed the patient's current medications.   Assessment/Plan:  1.Metastatic carcinoid tumor, biopsy of a liver lesion 10/23/2017 consistent with a well-differentiated neuroendocrine neoplasm, WHO grade 2,ki-6710%  CT abdomen/pelvis 10/13/2017-extensive liver metastases, cirrhosis, soft tissue mass in the small bowel mesentery versus a primary small bowel tumor, tiny lucent lesions in the pelvic bones  Elevated chromogranin A and 24-hour  urine 5-HIAA  CT chest 11/24/2017-subpleural nodule in the left lower lobe with associated radiotracer activity on comparison DOTATATEPET scan. No additional evidence of thoracic metastasis.  DOTATATE PET scan3/01/2018-intense radiotracer accumulation with innumerable confluent hepatic metastasis; intense radiotracer activity within central mesenteric mass; 2 foci of uptake associated with the small bowel; intense radiotracer activity associated periaortic and paraspinal lymph nodes; more distant solitary small metastasis within the pleural space left lower lobe.  Monthly Sandostatin initiated 11/26/2017, dose increased to 30 mg 01/21/2018  Cycle 1 Lutathera 03/03/2018, monthly Sandostatin continued  Cycle 2 Lutathera 04/27/2018, monthly Sandostatin continued  Cycle 3 Lutathera 06/23/2018  2.Diarrhea-likely secondary to carcinoid syndrome, improved with Sandostatin 3.Anorexia/weight loss-improved 4.Prostatic hypertrophy;status post laser vaporization 01/05/2018   Disposition: Charles Schmidt has completed 3 treatments with Lutathera.  He is tolerating the treatment well.  He continues monthly Sandostatin.  His performance status has improved significantly since beginning treatment last spring.  He will receive a Sandostatin injection today.  He is scheduled for the final treatment with Lutathera on 08/17/2018.  Charles Schmidt will return for an office visit in 2 months.      Betsy Coder, MD  07/20/2018  12:51 PM

## 2018-07-20 NOTE — Telephone Encounter (Signed)
Printed avs and calender of upcoming appointment. Per 10/29 los 

## 2018-07-20 NOTE — Patient Instructions (Signed)

## 2018-07-26 DIAGNOSIS — I10 Essential (primary) hypertension: Secondary | ICD-10-CM | POA: Diagnosis not present

## 2018-07-26 DIAGNOSIS — R7303 Prediabetes: Secondary | ICD-10-CM | POA: Diagnosis not present

## 2018-07-26 DIAGNOSIS — E340 Carcinoid syndrome: Secondary | ICD-10-CM | POA: Diagnosis not present

## 2018-08-17 ENCOUNTER — Ambulatory Visit: Payer: Medicare Other | Admitting: Oncology

## 2018-08-17 ENCOUNTER — Ambulatory Visit: Payer: Medicare Other

## 2018-08-17 ENCOUNTER — Other Ambulatory Visit: Payer: Medicare Other

## 2018-08-18 ENCOUNTER — Ambulatory Visit (HOSPITAL_COMMUNITY)
Admission: RE | Admit: 2018-08-18 | Discharge: 2018-08-18 | Disposition: A | Discharge status: Home / Self Care | Payer: Medicare Other | Source: Ambulatory Visit | Attending: Nurse Practitioner | Admitting: Nurse Practitioner

## 2018-08-18 DIAGNOSIS — C7A1 Malignant poorly differentiated neuroendocrine tumors: Secondary | ICD-10-CM | POA: Diagnosis not present

## 2018-08-18 DIAGNOSIS — C7A098 Malignant carcinoid tumors of other sites: Secondary | ICD-10-CM | POA: Diagnosis not present

## 2018-08-18 LAB — COMPREHENSIVE METABOLIC PANEL
ALT: 22 U/L (ref 0–44)
AST: 32 U/L (ref 15–41)
Albumin: 3.7 g/dL (ref 3.5–5.0)
Alkaline Phosphatase: 122 U/L (ref 38–126)
Anion gap: 7 (ref 5–15)
BILIRUBIN TOTAL: 0.7 mg/dL (ref 0.3–1.2)
BUN: 15 mg/dL (ref 8–23)
CHLORIDE: 104 mmol/L (ref 98–111)
CO2: 28 mmol/L (ref 22–32)
CREATININE: 0.95 mg/dL (ref 0.61–1.24)
Calcium: 9 mg/dL (ref 8.9–10.3)
GFR calc Af Amer: >60 mL/min (ref >60)
Glucose, Bld: 107 mg/dL — ABNORMAL HIGH (ref 70–99)
Potassium: 3.5 mmol/L (ref 3.5–5.1)
Sodium: 139 mmol/L (ref 135–145)
TOTAL PROTEIN: 7.7 g/dL (ref 6.5–8.1)

## 2018-08-18 LAB — CBC WITH DIFFERENTIAL/PLATELET
ABS IMMATURE GRANULOCYTES: 0.01 10*3/uL (ref 0.00–0.07)
Basophils Absolute: 0 10*3/uL (ref 0.0–0.1)
Basophils Relative: 0 %
EOS PCT: 2 %
Eosinophils Absolute: 0.1 10*3/uL (ref 0.0–0.5)
HEMATOCRIT: 36.1 % — AB (ref 39.0–52.0)
Hemoglobin: 11.7 g/dL — ABNORMAL LOW (ref 13.0–17.0)
Immature Granulocytes: 0 %
LYMPHS ABS: 0.9 10*3/uL (ref 0.7–4.0)
LYMPHS PCT: 32 %
MCH: 25.4 pg — ABNORMAL LOW (ref 26.0–34.0)
MCHC: 32.4 g/dL (ref 30.0–36.0)
MCV: 78.5 fL — ABNORMAL LOW (ref 80.0–100.0)
Monocytes Absolute: 0.3 10*3/uL (ref 0.1–1.0)
Monocytes Relative: 12 %
NEUTROS ABS: 1.4 10*3/uL — AB (ref 1.7–7.7)
NEUTROS PCT: 54 %
NRBC: 0 % (ref 0.0–0.2)
Platelets: 135 10*3/uL — ABNORMAL LOW (ref 150–400)
RBC: 4.6 MIL/uL (ref 4.22–5.81)
RDW: 18.1 % — ABNORMAL HIGH (ref 11.5–15.5)
WBC: 2.7 10*3/uL — ABNORMAL LOW (ref 4.0–10.5)

## 2018-08-18 MED ORDER — ONDANSETRON HCL 8 MG PO TABS: 8.0000 mg | ORAL_TABLET | Freq: Two times a day (BID) | ORAL | 0 refills | Status: DC | PRN

## 2018-08-18 MED ORDER — OCTREOTIDE ACETATE 500 MCG/ML IJ SOLN
INTRAMUSCULAR | Status: AC
  Filled 2018-08-18: qty 1

## 2018-08-18 MED ORDER — SODIUM CHLORIDE 0.9 % IV SOLN
8.0000 mg | Freq: Once | INTRAVENOUS | Status: AC
  Administered 2018-08-18: 8 mg via INTRAVENOUS
  Filled 2018-08-18: qty 4

## 2018-08-18 MED ORDER — AMINO ACID RADIOPROTECTANT - L-LYSINE 2.5%/L-ARGININE 2.5% IN NS
250.0000 mL/h | INTRAVENOUS | Status: AC
  Administered 2018-08-18: 250 mL/h via INTRAVENOUS
  Filled 2018-08-18: qty 1000

## 2018-08-18 MED ORDER — LUTETIUM LU 177 DOTATATE 370 MBQ/ML IV SOLN: 200.0000 | Freq: Once | INTRAVENOUS | Status: DC

## 2018-08-18 MED ORDER — OCTREOTIDE ACETATE 500 MCG/ML IJ SOLN: 500.0000 ug | Freq: Once | INTRAMUSCULAR | Status: DC | PRN

## 2018-08-18 MED ORDER — SODIUM CHLORIDE 0.9 % IV SOLN: 500.0000 mL | Freq: Once | INTRAVENOUS | Status: DC

## 2018-08-18 MED ORDER — PROCHLORPERAZINE EDISYLATE 10 MG/2ML IJ SOLN
10.0000 mg | Freq: Four times a day (QID) | INTRAMUSCULAR | Status: DC | PRN
  Filled 2018-08-18: qty 2

## 2018-08-18 MED ORDER — OCTREOTIDE ACETATE 30 MG IM KIT
30.0000 mg | PACK | Freq: Once | INTRAMUSCULAR | Status: AC
  Administered 2018-08-18: 30 mg via INTRAMUSCULAR

## 2018-08-18 MED ORDER — OCTREOTIDE ACETATE 30 MG IM KIT
PACK | INTRAMUSCULAR | Status: AC
  Filled 2018-08-18: qty 1

## 2018-09-16 ENCOUNTER — Inpatient Hospital Stay: Payer: Medicare Other | Attending: Oncology

## 2018-09-16 ENCOUNTER — Inpatient Hospital Stay: Payer: Medicare Other

## 2018-09-16 ENCOUNTER — Inpatient Hospital Stay (HOSPITAL_BASED_OUTPATIENT_CLINIC_OR_DEPARTMENT_OTHER): Payer: Medicare Other | Admitting: Oncology

## 2018-09-16 VITALS — BP 130/86 | HR 74 | Temp 98.2°F | Resp 17 | Ht 76.0 in | Wt 207.3 lb

## 2018-09-16 VITALS — BP 145/84 | HR 72 | Temp 97.7°F | Resp 18

## 2018-09-16 DIAGNOSIS — R630 Anorexia: Secondary | ICD-10-CM

## 2018-09-16 DIAGNOSIS — E340 Carcinoid syndrome: Secondary | ICD-10-CM

## 2018-09-16 DIAGNOSIS — C7B8 Other secondary neuroendocrine tumors: Secondary | ICD-10-CM | POA: Diagnosis not present

## 2018-09-16 DIAGNOSIS — C7B02 Secondary carcinoid tumors of liver: Secondary | ICD-10-CM

## 2018-09-16 LAB — CBC WITH DIFFERENTIAL (CANCER CENTER ONLY)
Abs Immature Granulocytes: 0 10*3/uL (ref 0.00–0.07)
Basophils Absolute: 0 10*3/uL (ref 0.0–0.1)
Basophils Relative: 0 %
EOS ABS: 0.1 10*3/uL (ref 0.0–0.5)
EOS PCT: 2 %
HEMATOCRIT: 35.8 % — AB (ref 39.0–52.0)
Hemoglobin: 11.8 g/dL — ABNORMAL LOW (ref 13.0–17.0)
IMMATURE GRANULOCYTES: 0 %
LYMPHS ABS: 0.7 10*3/uL (ref 0.7–4.0)
Lymphocytes Relative: 29 %
MCH: 25.8 pg — ABNORMAL LOW (ref 26.0–34.0)
MCHC: 33 g/dL (ref 30.0–36.0)
MCV: 78.2 fL — AB (ref 80.0–100.0)
MONOS PCT: 13 %
Monocytes Absolute: 0.3 10*3/uL (ref 0.1–1.0)
Neutro Abs: 1.3 10*3/uL — ABNORMAL LOW (ref 1.7–7.7)
Neutrophils Relative %: 56 %
Platelet Count: 123 10*3/uL — ABNORMAL LOW (ref 150–400)
RBC: 4.58 MIL/uL (ref 4.22–5.81)
RDW: 17.1 % — AB (ref 11.5–15.5)
WBC Count: 2.3 10*3/uL — ABNORMAL LOW (ref 4.0–10.5)
nRBC: 0 % (ref 0.0–0.2)

## 2018-09-16 LAB — CMP (CANCER CENTER ONLY)
ALBUMIN: 3.6 g/dL (ref 3.5–5.0)
ALK PHOS: 112 U/L (ref 38–126)
ALT: 20 U/L (ref 0–44)
AST: 25 U/L (ref 15–41)
Anion gap: 8 (ref 5–15)
BUN: 12 mg/dL (ref 8–23)
CALCIUM: 9.5 mg/dL (ref 8.9–10.3)
CO2: 29 mmol/L (ref 22–32)
Chloride: 104 mmol/L (ref 98–111)
Creatinine: 0.98 mg/dL (ref 0.61–1.24)
GFR, Est AFR Am: >60 mL/min (ref >60)
GFR, Estimated: >60 mL/min (ref >60)
GLUCOSE: 114 mg/dL — AB (ref 70–99)
Potassium: 3.6 mmol/L (ref 3.5–5.1)
SODIUM: 141 mmol/L (ref 135–145)
Total Bilirubin: 0.5 mg/dL (ref 0.3–1.2)
Total Protein: 7.7 g/dL (ref 6.5–8.1)

## 2018-09-16 MED ORDER — OCTREOTIDE ACETATE 30 MG IM KIT
30.0000 mg | PACK | Freq: Once | INTRAMUSCULAR | Status: AC
  Administered 2018-09-16: 30 mg via INTRAMUSCULAR

## 2018-09-16 NOTE — Progress Notes (Signed)
  [place] OFFICE PROGRESS NOTE   Diagnosis: Metastatic carcinoid tumor  INTERVAL HISTORY:   Charles Schmidt returns for a scheduled visit.  He completed cycle 4 Lutathera 08/18/2018.  He reports a good appetite.  He feels well.  He has a feeling of urgency for a bowel movement when he has "stress ".  No consistent diarrhea.  No difficulty with urination.  Objective:  Vital signs in last 24 hours:  Blood pressure 130/86, pulse 74, temperature 98.2 F (36.8 C), temperature source Oral, resp. rate 17, height '6\' 4"'$  (1.93 m), weight 207 lb 4.8 oz (94 kg), SpO2 100 %.    Resp: Lungs clear bilaterally Cardio: Regular rate and rhythm GI: The liver is palpable throughout the upper abdomen, nontender, no splenomegaly Vascular: No leg edema     Lab Results:  Lab Results  Component Value Date   WBC 2.3 (L) 09/16/2018   HGB 11.8 (L) 09/16/2018   HCT 35.8 (L) 09/16/2018   MCV 78.2 (L) 09/16/2018   PLT 123 (L) 09/16/2018   NEUTROABS 1.3 (L) 09/16/2018    CMP  Lab Results  Component Value Date   NA 141 09/16/2018   K 3.6 09/16/2018   CL 104 09/16/2018   CO2 29 09/16/2018   GLUCOSE 114 (H) 09/16/2018   BUN 12 09/16/2018   CREATININE 0.98 09/16/2018   CALCIUM 9.5 09/16/2018   PROT 7.7 09/16/2018   ALBUMIN 3.6 09/16/2018   AST 25 09/16/2018   ALT 20 09/16/2018   ALKPHOS 112 09/16/2018   BILITOT 0.5 09/16/2018   GFRNONAA >60 09/16/2018   GFRAA >60 09/16/2018    Medications: I have reviewed the patient's current medications.   Assessment/Plan: 1.Metastatic carcinoid tumor, biopsy of a liver lesion 10/23/2017 consistent with a well-differentiated neuroendocrine neoplasm, WHO grade 2,ki-6710%  CT abdomen/pelvis 10/13/2017-extensive liver metastases, cirrhosis, soft tissue mass in the small bowel mesentery versus a primary small bowel tumor, tiny lucent lesions in the pelvic bones  Elevated chromogranin A and 24-hour urine 5-HIAA  CT chest  11/24/2017-subpleural nodule in the left lower lobe with associated radiotracer activity on comparison DOTATATEPET scan. No additional evidence of thoracic metastasis.  DOTATATE PET scan3/01/2018-intense radiotracer accumulation with innumerable confluent hepatic metastasis; intense radiotracer activity within central mesenteric mass; 2 foci of uptake associated with the small bowel; intense radiotracer activity associated periaortic and paraspinal lymph nodes; more distant solitary small metastasis within the pleural space left lower lobe.  Monthly Sandostatin initiated 11/26/2017, dose increased to 30 mg 01/21/2018  Cycle 1 Lutathera 03/03/2018, monthly Sandostatin continued  Cycle 2 Lutathera 04/27/2018, monthly Sandostatin continued  Cycle 3 Lutathera 06/23/2018  Cycle 4 Lutathera 08/18/2018  2.Diarrhea-likely secondary to carcinoid syndrome, improved with Sandostatin 3.Anorexia/weight loss-improved 4.Prostatic hypertrophy;status post laser vaporization 01/05/2018    Disposition: Charles Schmidt appears stable.  He has completed the full course of Lutathera.  He is scheduled for a repeat Netspot an office visit with Dr. Ilda Foil on 09/29/2018.  He will continue monthly Sandostatin.  He will return for an office visit in 2 months.  He has mild neutropenia, likely secondary to [place].  He will seek medical attention for symptoms of an infection.  We will check a CBC when he returns for Sandostatin next month.  15 minutes were spent with the patient today.  The majority of the time was used for counseling and coordination of care.  Betsy Coder, MD  09/16/2018  9:10 AM

## 2018-09-27 DIAGNOSIS — R7303 Prediabetes: Secondary | ICD-10-CM | POA: Diagnosis not present

## 2018-09-27 DIAGNOSIS — E876 Hypokalemia: Secondary | ICD-10-CM | POA: Diagnosis not present

## 2018-09-27 DIAGNOSIS — E340 Carcinoid syndrome: Secondary | ICD-10-CM | POA: Diagnosis not present

## 2018-09-27 DIAGNOSIS — I10 Essential (primary) hypertension: Secondary | ICD-10-CM | POA: Diagnosis not present

## 2018-09-29 ENCOUNTER — Ambulatory Visit (HOSPITAL_COMMUNITY)
Admission: RE | Admit: 2018-09-29 | Discharge: 2018-09-29 | Disposition: A | Discharge status: Home / Self Care | Payer: Medicare Other | Source: Ambulatory Visit | Attending: Nurse Practitioner | Admitting: Nurse Practitioner

## 2018-09-29 DIAGNOSIS — C7A8 Other malignant neuroendocrine tumors: Secondary | ICD-10-CM | POA: Insufficient documentation

## 2018-09-29 DIAGNOSIS — C787 Secondary malignant neoplasm of liver and intrahepatic bile duct: Secondary | ICD-10-CM | POA: Diagnosis not present

## 2018-09-29 DIAGNOSIS — C772 Secondary and unspecified malignant neoplasm of intra-abdominal lymph nodes: Secondary | ICD-10-CM | POA: Diagnosis not present

## 2018-09-29 MED ORDER — GALLIUM GA 68 DOTATATE IV KIT
5.2000 | PACK | Freq: Once | INTRAVENOUS | Status: AC
  Administered 2018-09-29: 5.2 via INTRAVENOUS

## 2018-09-29 NOTE — Consult Note (Signed)
Chief Complaint: Patient with metastatic neuroendocrine tumor post completion of 4 cycles Peptide receptor radiotherapy (PRRT) with KZ601 DOTATATE (Lutathera).  Referring Physician(s):   Patient Status: [facility] - Out-pt  History of Present Illness: Charles Schmidt is a 78 year old male who underwent CT examination for weight loss and diarrhea and was found to have extensive hepatic metastasis.  Additional central mesenteric mass was identified.  Biopsy liver lesions demonstrated well differentiated neuroendocrine tumor (grade 2). Patient subsequently underwent gallium 1 DOTATATE PET scan which revealed intense avidity for the somatostatin receptor specific radiotracer within the diffuse liver lesions.  Lesions involving extensively the LEFT and RIGHT hepatic lobe.  Additionally the central mesenteric mass were avid for DOTATATE radiotracer.  Additional lesions were noted within the mesentery and probable small bowel.  A solitary LEFT lower lobe pulmonary nodule is also avid.  No skeletal metastasis]   Past Medical History:  Diagnosis Date  . BPH with obstruction/lower urinary tract symptoms   . Cataract, mature    12-28-2017  per pt right eye, legally blind  . Diarrhea concurrent with and due to carcinoid syndrome ([place])    12-28-2017 improvement since started Sandostatin monthly  . ED (erectile dysfunction)   . Elevated PSA   . Family history of cancer in son   . Family history of prostate cancer   . Foley catheter in place   . Glaucoma, left eye   . History of colon polyps   . History of urinary retention    2015; 2016; 01/ 2019  . Hypertension   . Metastatic malignant neuroendocrine tumor to liver [facility]) oncoloigst-  dr Benay Spice   dx 10-23-2017 by liver bx-- WHO grade 2; small bowel mesenteric mass, cirrhosis, mets left lower lobe solitary nodule ,  periaortic and paraspinal lymph nodes METS--  started monthly Sandostatin 11-20-2017  . Microcytic anemia   . RBBB (right bundle  branch block with left anterior fascicular block)   . Urinary retention   . Venous reflux     Past Surgical History:  Procedure Laterality Date  . INGUINAL HERNIA REPAIR  1990s   unsure side  . IR US GUIDE BX ASP/DRAIN  10/23/2017  . QUADRICEPS TENDON REPAIR Left 01-08-2011   dr Veverly Fells  . THULIUM LASER TURP (TRANSURETHRAL RESECTION OF PROSTATE) N/A 01/05/2018   Procedure: Marcelino Duster LASER OF THE PROSTATE;  Surgeon: Festus Aloe, MD;  Location: [facility];  Service: Urology;  Laterality: N/A;  . TONSILLECTOMY  child    Allergies: Patient has no known allergies.  Medications: Prior to Admission medications   Medication Sig Start Date End Date Taking? Authorizing Provider  amLODipine (NORVASC) 5 MG tablet Take 1 tablet (5 mg total) by mouth at bedtime. 05/01/18   Jacqualine Mau, NP  dorzolamide-timolol (COSOPT) 22.3-6.8 MG/ML ophthalmic solution Place 1 drop into the left eye 2 (two) times daily.     [provider]  finasteride (PROSCAR) 5 MG tablet Take 5 mg by mouth at bedtime.     [provider]  hydrochlorothiazide (HYDRODIURIL) 25 MG tablet Take 1 tablet (25 mg total) by mouth at bedtime. 05/01/18   Jacqualine Mau, NP  loperamide (IMODIUM) 2 MG capsule Take 2 mg by mouth as needed for diarrhea or loose stools.    [provider]  Multiple Vitamin (MULTIVITAMIN WITH MINERALS) TABS tablet Take 1 tablet by mouth at bedtime. Centrum Silver    [provider]  potassium chloride SA (K-DUR,KLOR-CON) 20 MEQ tablet Take 20 mEq by  mouth daily. 07/09/18   [provider]  Saw Palmetto, Serenoa repens, (SAW PALMETTO PO) Take 1 tablet by mouth at bedtime.     [provider]  tamsulosin (FLOMAX) 0.4 MG CAPS capsule Take 1 capsule (0.4 mg total) by mouth daily after supper. Patient taking differently: Take 0.4 mg by mouth at bedtime.  10/22/14   [name], MD  Travoprost, BAK Free, (TRAVATAN) 0.004 % SOLN  ophthalmic solution Place 1 drop into the left eye at bedtime.     [provider]     Family History  Problem Relation Age of Onset  . Hypertension Mother   . Stroke Father   . Prostate cancer Father        'slow growing'  . Kidney Stones Sister   . Cancer Son 9       neuroendocrine tumors of small intestine    Social History   Socioeconomic History  . Marital status: Legally Separated    Spouse name: Not on file  . Number of children: Not on file  . Years of education: Not on file  . Highest education level: Not on file  Occupational History  . Not on file  Social Needs  . Financial resource strain: Not on file  . Food insecurity:    Worry: Not on file    Inability: Not on file  . Transportation needs:    Medical: Not on file    Non-medical: Not on file  Tobacco Use  . Smoking status: Never Smoker  . Smokeless tobacco: Never Used  Substance and Sexual Activity  . Alcohol use: Not Currently    Alcohol/week: 1.0 standard drinks    Types: 1 Standard drinks or equivalent per week    Comment: week  . Drug use: No  . Sexual activity: Not on file  Lifestyle  . Physical activity:    Days per week: Not on file    Minutes per session: Not on file  . Stress: Not on file  Relationships  . Social connections:    Talks on phone: Not on file    Gets together: Not on file    Attends religious service: Not on file    Active member of club or organization: Not on file    Attends meetings of clubs or organizations: Not on file    Relationship status: Not on file  Other Topics Concern  . Not on file  Social History Narrative  . Not on file    ECOG Status: 1 - Symptomatic but completely ambulatory  Review of Systems: A 12 point ROS discussed and pertinent positives are indicated in the HPI above.  All other systems are negative.  Review of Systems  Vital Signs: There were no vitals taken for this visit.  Physical Exam  Imaging: Nm Pet (netspot Ga 88  Dotatate) Skull Base To Mid Thigh  Result Date: 09/29/2018 CLINICAL DATA:  Well differentiated neuroendocrine tumor with liver metastasis and mesenteric metastasis. Patient status post peptide receptor radiotherapy. Patient completed 4 cycles Lutathera with the completion of treatment on 08/18/2018. EXAM: NUCLEAR MEDICINE PET SKULL BASE TO THIGH TECHNIQUE: 5.2 mCi Ga 86 DOTATATE was injected intravenously. Full-ring PET imaging was performed from the skull base to thigh after the radiotracer. CT data was obtained and used for attenuation correction and anatomic localization. COMPARISON:  DOTATATE PET-CT scan 11/24/2017 FINDINGS: NECK No radiotracer activity in neck lymph nodes. Incidental CT findings: None CHEST Single pleural nodule in the LEFT lower lobe associated radiotracer  activity similar to prior. No pulmonary parenchymal nodules no mediastinal adenopathy Incidental CT finding:None ABDOMEN/PELVIS Multiple lesions are again demonstrated within the liver which accumulate the somatostatin specific radiotracer. The number of lesions which accumulate the tracer has decreased visually compared to prior exam. Some lesions are no longer evident along the RIGHT hepatic margin. The remaining measurable lesions have radiotracer activity similar to prior. For example lesion in the LEFT hepatic lobe with SUV max equal 19.7 compares to SUV max equal 22.5. Lesions subcapsular RIGHT hepatic lobe with SUV max equal 19.1 compared to SUV max equal 27.2. No new lesions present. The central mesenteric mass is not changed in size measuring 3.2 cm image 146/4 and remains avid for radiotracer with SUV max equal 24 compared SUV max equal 27. Adjacent lesion within the small bowel SUV max equal 10.5 compared to SUV max equal 15.8. Smaller lesion previously identified in the upper pelvis small-bowel appear decreased in activity with SUV max equal 7.4 compared SUV max equal 15.6. This small bowel loop has changed position in the  interval. No new metastatic lesions compared to prior. Lesion positioned between the RIGHT psoas muscle and the spine SUV max equal 21.8 compared SUV max equal 26.2. Physiologic activity noted in the liver, spleen, adrenal glands and kidneys. Incidental CT findings:None SKELETON No focal activity to suggest skeletal metastasis. Incidental CT findings:None IMPRESSION: 1. Overall positive response to peptide receptor radiotherapy. 2. While there remain multiple lesions within liver with intense somatostatin receptor activity, several lesions have resolved activity, others are decreased in activity and a some are similar inactivity. No new lesions present. 3. Interval decrease in activity of the mesenteric and small bowel lesions. 4. Mild decreased activity of the paraspinal lesion adjacent the RIGHT psoas muscle and LEFT pleural lesion. 5. No evidence of new or progressive disease. Electronically Signed   By: Suzy Bouchard M.D.   On: 09/29/2018 13:23   Nm Pet (netspot Ga 68 Dotatate) Skull Base To Mid Thigh  Result Date: 11/24/2017 CLINICAL DATA:  Metastatic neuroendocrine tumor identified on liver biopsy. EXAM: NUCLEAR MEDICINE PET SKULL BASE TO THIGH TECHNIQUE: 5.2 mCi Ga 49 DOTATATE was injected intravenously. Full-ring PET imaging was performed from the skull base to thigh after the radiotracer. CT data was obtained and used for attenuation correction and anatomic localization. COMPARISON:  CT abdomen 10/13/2017 FINDINGS: NECK No radiotracer activity in neck lymph nodes. CHEST No abnormal radiotracer accumulation within mediastinal lymph nodes. No parenchymal nodules are present. One subpleural nodule is present in the LEFT lower lobe which measures 10 mm (image 93, series 4) and has clear associated radiotracer activity SUV max 8. ABDOMEN/PELVIS Near confluent tumor involvement the entire liver with multiple lesions which are avid for neuroendocrine tumor specific radiotracer DOTATATE. Liver lesions are too  numerous to count. Example larger lesions in LEFT hepatic lobe has intense radiotracer activity with SUV max equal 22.5. Lesion RIGHT hepatic lobe lesion with SUV max equal 27.2 The central mesenteric mass measuring 2.9 by 2.7 cm (image 152, series 4) and has intense radiotracer activity with SUV max 7.4 There is intense activity in an loop of small bowel LEFT adjacent to the central mesenteric lesion with SUV max equal 15.8. Discrete lesions difficult define in adjacent small bowel. Small focus of uptake within loop of small bowel whichdips into into the central pelvis (image 168 of fused data set). There is intense radiotracer activity associated with lymph nodes positioned between the aorta and uncinate of pancreas with SUV max 20.1.  Lymph node positioned between the RIGHT paraspinal musculature and L2 vertebral bodyon the RIGHT has intense radiotracer activity with SUV max 26.2. Physiologic activity noted in the spleen, adrenal glands and kidneys. Incidental CT: Foley catheter within the bladder. Gas within the bladder. Benign LEFT renal cyst SKELETON No focal activity to suggest skeletal metastasis. IMPRESSION: 1. Intense radiotracer accumulation within innumerable confluent hepatic metastasis consistent well differentiated neuroendocrine tumor. 2. Intense radiotracer activity within central mesenteric mass consistent with metastatic neuroendocrine tumor. 3. Two foci of uptake associated the small bowel consists with primary or metastatic neuroendocrine tumor 4. Intense radiotracer activity associated with periaortic and paraspinal lymph nodes consistent with nodal metastasis. 5. More distant solitary small metastasis within the pleural space of the LEFT lower lobe. 6. No skeletal metastasis identified. Electronically Signed   By: Suzy Bouchard M.D.   On: 11/24/2017 15:10    Labs:  CBC: Recent Labs    06/23/18 0835 07/20/18 1049 08/18/18 0827 09/16/18 0820  WBC 2.7* 2.8* 2.7* 2.3*  HGB 12.4*  12.1* 11.7* 11.8*  HCT 36.6* 36.1* 36.1* 35.8*  PLT 165 142* 135* 123*    COAGS: Recent Labs    10/23/17 0832  INR 1.09  APTT 28    BMP: Recent Labs    06/23/18 0835 07/20/18 1049 08/18/18 0827 09/16/18 0820  NA 142 140 139 141  K 3.7 3.8 3.5 3.6  CL 104 102 104 104  CO2 27 29 28 29   GLUCOSE 122* 143* 107* 114*  BUN 14 11 15 12   CALCIUM 9.7 9.5 9.0 9.5  CREATININE 0.86 1.02 0.95 0.98  GFRNONAA >60 >60 >60 >60  GFRAA >60 >60 >60 >60    LIVER FUNCTION TESTS: Recent Labs    06/23/18 0835 07/20/18 1049 08/18/18 0827 09/16/18 0820  BILITOT 0.5 0.5 0.7 0.5  AST 35 24 32 25  ALT 23 16 22 20   ALKPHOS 146* 123 122 112  PROT 7.8 7.8 7.7 7.7  ALBUMIN 3.7 3.5 3.7 3.6    TUMOR MARKERS: Chromogranin A = 209 pn 03/03/18 Assessment and Plan:  Patient presents for follow-up of peptide receptor radiotherapy treatment.  Patient is accompanied by daughter who has been present for the entirety of treatment..  Patient has completed 4 cycles of peptide receptor radiotherapy (Lu 177 DOTATATE) with last administration on 08/18/2018.  Patient tolerated the procedure very well.  Normal postprocedural renal function and hepatic function.  White blood cell count remains mildly depressed through the course of treatment (WBC  = 2.3K on 09/16/2018).  Recommend continued monitoring and hopefully level will return to normal.  In the interval patient advised to contact the provider if signs of fever or infection.   Post therapy gallium 29 DOTATATE PET scan (09/29/2018) demonstrates no evidence disease progression.  There is evidence of positive therapy response with reduced activityof some liver lesions.  Reduced activity in mesenteric and small-bowel lesion additionally.  There still remains significant tumor burden within the liver.   Symptomatically, patient reports improved diarrhea with no current diarrhea reported.  Patient has improved urinary symptoms.  Patient does report some  fatigue.  Recommend continued Sandostatin injections for management of GI symptoms and tumor suppression.   Patient's CHROMOGRANIN A was elevated prior to initiation of therapy (chromogranin A = 209 on 03/03/2018.)  Recommend redrawing chromogranin A  levels with next blood work [place] injection.   Patient advised that continue/delayed radiotoxicity to the tumors can be expected over the next several months.  No imaging necessary for at  least next 6 months unless patient symptomology worsens.  Consider follow-up DOTATATE PET scan or contrast CT scan in 6 months.   Thank you for this interesting consult.  I greatly enjoyed meeting Charles Schmidt and look forward to participating in their care.  A copy of this report was sent to the requesting provider on this date.  Electronically Signed: Rennis Golden, MD 09/29/2018, 2:24 PM   I spent a total of    25 Minutes in face to face in clinical consultation, greater than 50% of which was counseling/coordinating care for metastatic neuroendocrine tumor.

## 2018-10-14 ENCOUNTER — Inpatient Hospital Stay: Payer: Medicare Other

## 2018-10-14 ENCOUNTER — Inpatient Hospital Stay: Payer: Medicare Other | Attending: Oncology

## 2018-10-14 DIAGNOSIS — C7B8 Other secondary neuroendocrine tumors: Secondary | ICD-10-CM | POA: Diagnosis not present

## 2018-10-14 DIAGNOSIS — C7B02 Secondary carcinoid tumors of liver: Secondary | ICD-10-CM | POA: Diagnosis not present

## 2018-10-14 LAB — CBC WITH DIFFERENTIAL (CANCER CENTER ONLY)
Abs Immature Granulocytes: 0.01 10*3/uL (ref 0.00–0.07)
BASOS ABS: 0 10*3/uL (ref 0.0–0.1)
Basophils Relative: 0 %
EOS ABS: 0 10*3/uL (ref 0.0–0.5)
EOS PCT: 1 %
HEMATOCRIT: 35.5 % — AB (ref 39.0–52.0)
Hemoglobin: 11.6 g/dL — ABNORMAL LOW (ref 13.0–17.0)
Immature Granulocytes: 0 %
LYMPHS ABS: 0.7 10*3/uL (ref 0.7–4.0)
Lymphocytes Relative: 27 %
MCH: 25.7 pg — ABNORMAL LOW (ref 26.0–34.0)
MCHC: 32.7 g/dL (ref 30.0–36.0)
MCV: 78.7 fL — ABNORMAL LOW (ref 80.0–100.0)
MONOS PCT: 12 %
Monocytes Absolute: 0.3 10*3/uL (ref 0.1–1.0)
NRBC: 0 % (ref 0.0–0.2)
Neutro Abs: 1.5 10*3/uL — ABNORMAL LOW (ref 1.7–7.7)
Neutrophils Relative %: 60 %
Platelet Count: 146 10*3/uL — ABNORMAL LOW (ref 150–400)
RBC: 4.51 MIL/uL (ref 4.22–5.81)
RDW: 17.1 % — AB (ref 11.5–15.5)
WBC Count: 2.6 10*3/uL — ABNORMAL LOW (ref 4.0–10.5)

## 2018-10-14 MED ORDER — OCTREOTIDE ACETATE 30 MG IM KIT
30.0000 mg | PACK | Freq: Once | INTRAMUSCULAR | Status: AC
  Administered 2018-10-14: 30 mg via INTRAMUSCULAR

## 2018-10-14 MED ORDER — OCTREOTIDE ACETATE 30 MG IM KIT
PACK | INTRAMUSCULAR | Status: AC
  Filled 2018-10-14: qty 1

## 2018-10-14 NOTE — Patient Instructions (Signed)

## 2018-11-10 ENCOUNTER — Other Ambulatory Visit: Payer: Self-pay | Admitting: Nurse Practitioner

## 2018-11-10 DIAGNOSIS — C7B8 Other secondary neuroendocrine tumors: Secondary | ICD-10-CM

## 2018-11-11 ENCOUNTER — Inpatient Hospital Stay: Payer: Medicare Other | Attending: Oncology | Admitting: Nurse Practitioner

## 2018-11-11 ENCOUNTER — Telehealth: Payer: Self-pay | Admitting: Nurse Practitioner

## 2018-11-11 ENCOUNTER — Inpatient Hospital Stay: Payer: Medicare Other

## 2018-11-11 ENCOUNTER — Encounter: Payer: Self-pay | Admitting: Nurse Practitioner

## 2018-11-11 VITALS — BP 137/86 | HR 70 | Temp 97.9°F | Resp 17 | Ht 76.0 in | Wt 212.3 lb

## 2018-11-11 DIAGNOSIS — C7B8 Other secondary neuroendocrine tumors: Secondary | ICD-10-CM | POA: Insufficient documentation

## 2018-11-11 DIAGNOSIS — E340 Carcinoid syndrome: Secondary | ICD-10-CM | POA: Diagnosis not present

## 2018-11-11 DIAGNOSIS — R197 Diarrhea, unspecified: Secondary | ICD-10-CM | POA: Diagnosis not present

## 2018-11-11 DIAGNOSIS — C7B02 Secondary carcinoid tumors of liver: Secondary | ICD-10-CM | POA: Diagnosis not present

## 2018-11-11 DIAGNOSIS — R630 Anorexia: Secondary | ICD-10-CM | POA: Diagnosis not present

## 2018-11-11 LAB — CBC WITH DIFFERENTIAL (CANCER CENTER ONLY)
Abs Immature Granulocytes: 0 10*3/uL (ref 0.00–0.07)
BASOS ABS: 0 10*3/uL (ref 0.0–0.1)
Basophils Relative: 0 %
EOS PCT: 2 %
Eosinophils Absolute: 0 10*3/uL (ref 0.0–0.5)
HEMATOCRIT: 34 % — AB (ref 39.0–52.0)
Hemoglobin: 11.2 g/dL — ABNORMAL LOW (ref 13.0–17.0)
Immature Granulocytes: 0 %
Lymphocytes Relative: 27 %
Lymphs Abs: 0.6 10*3/uL — ABNORMAL LOW (ref 0.7–4.0)
MCH: 26.2 pg (ref 26.0–34.0)
MCHC: 32.9 g/dL (ref 30.0–36.0)
MCV: 79.6 fL — ABNORMAL LOW (ref 80.0–100.0)
MONO ABS: 0.4 10*3/uL (ref 0.1–1.0)
Monocytes Relative: 15 %
NRBC: 0 % (ref 0.0–0.2)
Neutro Abs: 1.3 10*3/uL — ABNORMAL LOW (ref 1.7–7.7)
Neutrophils Relative %: 56 %
Platelet Count: 129 10*3/uL — ABNORMAL LOW (ref 150–400)
RBC: 4.27 MIL/uL (ref 4.22–5.81)
RDW: 17.7 % — AB (ref 11.5–15.5)
WBC: 2.3 10*3/uL — AB (ref 4.0–10.5)

## 2018-11-11 MED ORDER — OCTREOTIDE ACETATE 30 MG IM KIT
30.0000 mg | PACK | Freq: Once | INTRAMUSCULAR | Status: AC
  Administered 2018-11-11: 30 mg via INTRAMUSCULAR

## 2018-11-11 MED ORDER — OCTREOTIDE ACETATE 30 MG IM KIT
PACK | INTRAMUSCULAR | Status: AC
  Filled 2018-11-11: qty 1

## 2018-11-11 NOTE — Telephone Encounter (Signed)
Scheduled appt per 2/20 los. ° °Printed calendar and avs. °

## 2018-11-11 NOTE — Patient Instructions (Signed)

## 2018-11-11 NOTE — Progress Notes (Signed)
[facility] OFFICE PROGRESS NOTE   Diagnosis: Metastatic carcinoid tumor  INTERVAL HISTORY:   Charles Schmidt returns as scheduled.  He continues monthly Sandostatin injections.  He overall is feeling well.  He denies abdominal pain.  He has intermittent loose stools which seem to be diet related.  He takes Imodium once a day.  No nausea or vomiting.  Appetite is better.  No flushing or sweating episodes.  Objective:  Vital signs in last 24 hours:  Blood pressure 137/86, pulse 70, temperature 97.9 F (36.6 C), temperature source Oral, resp. rate 17, height 6' 4"  (1.93 m), weight 212 lb 4.8 oz (96.3 kg), SpO2 100 %.    HEENT: No thrush or ulcers.  Small cystic lesion right lower inner gumline. Resp: Lungs clear bilaterally. Cardio: Regular rate and rhythm. GI: Liver palpable throughout the upper abdomen.  Nontender. Vascular: No leg edema.    Lab Results:  Lab Results  Component Value Date   WBC 2.3 (L) 11/11/2018   HGB 11.2 (L) 11/11/2018   HCT 34.0 (L) 11/11/2018   MCV 79.6 (L) 11/11/2018   PLT 129 (L) 11/11/2018   NEUTROABS 1.3 (L) 11/11/2018    Imaging:  No results found.  Medications: I have reviewed the patient's current medications.  Assessment/Plan: 1.Metastatic carcinoid tumor, biopsy of a liver lesion 10/23/2017 consistent with a well-differentiated neuroendocrine neoplasm, WHO grade 2,ki-6710%  CT abdomen/pelvis 10/13/2017-extensive liver metastases, cirrhosis, soft tissue mass in the small bowel mesentery versus a primary small bowel tumor, tiny lucent lesions in the pelvic bones  Elevated chromogranin A and 24-hour urine 5-HIAA  CT chest 11/24/2017-subpleural nodule in the left lower lobe with associated radiotracer activity on comparison DOTATATEPET scan. No additional evidence of thoracic metastasis.  DOTATATE PET scan3/01/2018-intense radiotracer accumulation with innumerable confluent hepatic metastasis; intense radiotracer activity  within central mesenteric mass; 2 foci of uptake associated with the small bowel; intense radiotracer activity associated periaortic and paraspinal lymph nodes; more distant solitary small metastasis within the pleural space left lower lobe.  Monthly Sandostatin initiated 11/26/2017, dose increased to 30 mg 01/21/2018  Cycle 1 Lutathera 03/03/2018, monthly Sandostatin continued  Cycle 2 Lutathera 04/27/2018, monthly Sandostatin continued  Cycle 3 Lutathera 06/23/2018  Cycle 4 Lutathera 08/18/2018  Restaging dotatate PET scan 09/29/2018- multiple lesions again demonstrated within the liver which accumulate the radiotracer.  The number of lesions which accumulate the tracer has decreased visually compared to the prior exam.  Some lesions are no longer evident along the right hepatic margin.  Remaining measurable lesions have radiotracer activity similar to prior.  No new lesions present.  Central mesenteric mass unchanged and remains avid for radiotracer.  Adjacent lesion within the small bowel SUV max equal 10.5 compared to SUV max equal 15.8.  Smaller lesion previously identified in the upper pelvis small bowel appears decreased in activity.  No new metastatic lesions.  Lesion position between the right psoas muscle and spine SUV max equal to 21.8 compared with SUV max equal 26.2.  2.Diarrhea-likely secondary to carcinoid syndrome, improved with Sandostatin 3.Anorexia/weight loss-improved 4.Prostatic hypertrophy;status post laser vaporization 01/05/2018   Disposition: Charles Schmidt appears stable.  The follow-up dotatate PET scan showed a positive response to therapy.  Plan to continue monthly Sandostatin injections.  He has persistent mild neutropenia.  This may be related to [name].  He will contact the office with signs of infection.  He plans to follow-up with his dentist regarding the cystic lesion at the right lower inner gumline.  He will  return for a Sandostatin injection in 1 month.   We will see him in follow-up in 2 months.  He will contact the office in the interim as outlined above or with any other problems.    [name] ANP/GNP-BC   11/11/2018  1:20 PM

## 2018-11-22 ENCOUNTER — Other Ambulatory Visit: Payer: Self-pay | Admitting: Oncology

## 2018-11-22 ENCOUNTER — Other Ambulatory Visit: Payer: Self-pay | Admitting: Nurse Practitioner

## 2018-11-22 DIAGNOSIS — C7B8 Other secondary neuroendocrine tumors: Secondary | ICD-10-CM

## 2018-11-22 LAB — CHROMOGRANIN A REBASELINE: Chromogranin A (ng/mL): 7464 ng/mL — ABNORMAL HIGH (ref 0.0–101.8)

## 2018-12-09 DIAGNOSIS — H401122 Primary open-angle glaucoma, left eye, moderate stage: Secondary | ICD-10-CM | POA: Diagnosis not present

## 2018-12-10 ENCOUNTER — Inpatient Hospital Stay: Payer: Medicare Other

## 2018-12-10 ENCOUNTER — Other Ambulatory Visit: Payer: Self-pay

## 2018-12-10 ENCOUNTER — Inpatient Hospital Stay: Payer: Medicare Other | Attending: Oncology

## 2018-12-10 VITALS — BP 121/69 | HR 73 | Temp 97.8°F | Resp 18

## 2018-12-10 DIAGNOSIS — C7B02 Secondary carcinoid tumors of liver: Secondary | ICD-10-CM | POA: Insufficient documentation

## 2018-12-10 DIAGNOSIS — C7B8 Other secondary neuroendocrine tumors: Secondary | ICD-10-CM

## 2018-12-10 LAB — CBC WITH DIFFERENTIAL (CANCER CENTER ONLY)
ABS IMMATURE GRANULOCYTES: 0 10*3/uL (ref 0.00–0.07)
BASOS ABS: 0 10*3/uL (ref 0.0–0.1)
BASOS PCT: 0 %
Eosinophils Absolute: 0 10*3/uL (ref 0.0–0.5)
Eosinophils Relative: 1 %
HCT: 35 % — ABNORMAL LOW (ref 39.0–52.0)
HEMOGLOBIN: 11.4 g/dL — AB (ref 13.0–17.0)
IMMATURE GRANULOCYTES: 0 %
LYMPHS PCT: 30 %
Lymphs Abs: 0.7 10*3/uL (ref 0.7–4.0)
MCH: 26.2 pg (ref 26.0–34.0)
MCHC: 32.6 g/dL (ref 30.0–36.0)
MCV: 80.5 fL (ref 80.0–100.0)
MONO ABS: 0.2 10*3/uL (ref 0.1–1.0)
Monocytes Relative: 10 %
NEUTROS ABS: 1.4 10*3/uL — AB (ref 1.7–7.7)
NEUTROS PCT: 59 %
PLATELETS: 139 10*3/uL — AB (ref 150–400)
RBC: 4.35 MIL/uL (ref 4.22–5.81)
RDW: 17.4 % — ABNORMAL HIGH (ref 11.5–15.5)
WBC: 2.3 10*3/uL — AB (ref 4.0–10.5)
nRBC: 0 % (ref 0.0–0.2)

## 2018-12-10 MED ORDER — OCTREOTIDE ACETATE 30 MG IM KIT
30.0000 mg | PACK | Freq: Once | INTRAMUSCULAR | Status: AC
  Administered 2018-12-10: 30 mg via INTRAMUSCULAR

## 2018-12-10 MED ORDER — OCTREOTIDE ACETATE 30 MG IM KIT
PACK | INTRAMUSCULAR | Status: AC
  Filled 2018-12-10: qty 1

## 2018-12-10 NOTE — Patient Instructions (Signed)

## 2018-12-21 LAB — CHROMOGRANIN A REBASELINE
CHROMOGRANIN A (NG/ML): 7964 ng/mL — AB (ref 0.0–101.8)
CHROMOGRANIN A: 111 nmol/L — AB (ref 0–5)

## 2018-12-27 DIAGNOSIS — E340 Carcinoid syndrome: Secondary | ICD-10-CM | POA: Diagnosis not present

## 2018-12-27 DIAGNOSIS — I10 Essential (primary) hypertension: Secondary | ICD-10-CM | POA: Diagnosis not present

## 2018-12-27 DIAGNOSIS — R7303 Prediabetes: Secondary | ICD-10-CM | POA: Diagnosis not present

## 2019-01-10 ENCOUNTER — Other Ambulatory Visit: Payer: Self-pay

## 2019-01-10 ENCOUNTER — Telehealth: Payer: Self-pay | Admitting: Oncology

## 2019-01-10 ENCOUNTER — Inpatient Hospital Stay: Payer: Medicare Other | Attending: Oncology

## 2019-01-10 ENCOUNTER — Inpatient Hospital Stay: Payer: Medicare Other

## 2019-01-10 ENCOUNTER — Inpatient Hospital Stay (HOSPITAL_BASED_OUTPATIENT_CLINIC_OR_DEPARTMENT_OTHER): Payer: Medicare Other | Admitting: Oncology

## 2019-01-10 VITALS — BP 125/81 | HR 70 | Temp 98.6°F | Resp 18 | Ht 76.0 in | Wt 211.6 lb

## 2019-01-10 DIAGNOSIS — K1370 Unspecified lesions of oral mucosa: Secondary | ICD-10-CM

## 2019-01-10 DIAGNOSIS — D61811 Other drug-induced pancytopenia: Secondary | ICD-10-CM | POA: Diagnosis not present

## 2019-01-10 DIAGNOSIS — Z79899 Other long term (current) drug therapy: Secondary | ICD-10-CM | POA: Diagnosis not present

## 2019-01-10 DIAGNOSIS — C7B09 Secondary carcinoid tumors of other sites: Secondary | ICD-10-CM | POA: Insufficient documentation

## 2019-01-10 DIAGNOSIS — C7B02 Secondary carcinoid tumors of liver: Secondary | ICD-10-CM

## 2019-01-10 DIAGNOSIS — C7A00 Malignant carcinoid tumor of unspecified site: Secondary | ICD-10-CM | POA: Insufficient documentation

## 2019-01-10 DIAGNOSIS — N400 Enlarged prostate without lower urinary tract symptoms: Secondary | ICD-10-CM | POA: Diagnosis not present

## 2019-01-10 DIAGNOSIS — R935 Abnormal findings on diagnostic imaging of other abdominal regions, including retroperitoneum: Secondary | ICD-10-CM | POA: Diagnosis not present

## 2019-01-10 DIAGNOSIS — C7B8 Other secondary neuroendocrine tumors: Secondary | ICD-10-CM

## 2019-01-10 LAB — CBC WITH DIFFERENTIAL (CANCER CENTER ONLY)
Abs Immature Granulocytes: 0 10*3/uL (ref 0.00–0.07)
Basophils Absolute: 0 10*3/uL (ref 0.0–0.1)
Basophils Relative: 0 %
Eosinophils Absolute: 0.1 10*3/uL (ref 0.0–0.5)
Eosinophils Relative: 3 %
HCT: 35 % — ABNORMAL LOW (ref 39.0–52.0)
Hemoglobin: 11.3 g/dL — ABNORMAL LOW (ref 13.0–17.0)
Immature Granulocytes: 0 %
Lymphocytes Relative: 27 %
Lymphs Abs: 0.8 10*3/uL (ref 0.7–4.0)
MCH: 25.9 pg — ABNORMAL LOW (ref 26.0–34.0)
MCHC: 32.3 g/dL (ref 30.0–36.0)
MCV: 80.3 fL (ref 80.0–100.0)
Monocytes Absolute: 0.3 10*3/uL (ref 0.1–1.0)
Monocytes Relative: 12 %
Neutro Abs: 1.6 10*3/uL — ABNORMAL LOW (ref 1.7–7.7)
Neutrophils Relative %: 58 %
Platelet Count: 127 10*3/uL — ABNORMAL LOW (ref 150–400)
RBC: 4.36 MIL/uL (ref 4.22–5.81)
RDW: 17.2 % — ABNORMAL HIGH (ref 11.5–15.5)
WBC Count: 2.8 10*3/uL — ABNORMAL LOW (ref 4.0–10.5)
nRBC: 0 % (ref 0.0–0.2)

## 2019-01-10 MED ORDER — OCTREOTIDE ACETATE 30 MG IM KIT
PACK | INTRAMUSCULAR | Status: AC
  Filled 2019-01-10: qty 1

## 2019-01-10 MED ORDER — OCTREOTIDE ACETATE 30 MG IM KIT
30.0000 mg | PACK | Freq: Once | INTRAMUSCULAR | Status: AC
  Administered 2019-01-10: 30 mg via INTRAMUSCULAR

## 2019-01-10 NOTE — Progress Notes (Signed)
[facility] OFFICE PROGRESS NOTE   Diagnosis: Carcinoid tumor  INTERVAL HISTORY:   Mr. Redmond Pulling returns as scheduled.  He continues monthly Sandostatin.  He takes one half an Imodium tablet twice daily.  He does not have diarrhea.  No difficulty with urination.  No other complaint. He has noted a lesion at the right buccal mucosa Objective:  Vital signs in last 24 hours:  Blood pressure 125/81, pulse 70, temperature 98.6 F (37 C), temperature source Oral, resp. rate 18, height 6' 4"  (1.93 m), weight 211 lb 9.6 oz (96 kg), SpO2 99 %.    HEENT: 4-5 mm raised rounded flesh-colored lesion at the anterior right buccal mucosa GI: The liver is palpable throughout the upper abdomen and extends to the left of the midline  Vascular: No leg edema   Lab Results:  Lab Results  Component Value Date   WBC 2.8 (L) 01/10/2019   HGB 11.3 (L) 01/10/2019   HCT 35.0 (L) 01/10/2019   MCV 80.3 01/10/2019   PLT 127 (L) 01/10/2019   NEUTROABS 1.6 (L) 01/10/2019    Medications: I have reviewed the patient's current medications.   Assessment/Plan: Metastatic carcinoid tumor, biopsy of a liver lesion 10/23/2017 consistent with a well-differentiated neuroendocrine neoplasm, WHO grade 2,ki-6710%  CT abdomen/pelvis 10/13/2017-extensive liver metastases, cirrhosis, soft tissue mass in the small bowel mesentery versus a primary small bowel tumor, tiny lucent lesions in the pelvic bones  Elevated chromogranin A and 24-hour urine 5-HIAA  CT chest 11/24/2017-subpleural nodule in the left lower lobe with associated radiotracer activity on comparison DOTATATEPET scan. No additional evidence of thoracic metastasis.  DOTATATE PET scan3/01/2018-intense radiotracer accumulation with innumerable confluent hepatic metastasis; intense radiotracer activity within central mesenteric mass; 2 foci of uptake associated with the small bowel; intense radiotracer activity associated periaortic and paraspinal  lymph nodes; more distant solitary small metastasis within the pleural space left lower lobe.  Monthly Sandostatin initiated 11/26/2017, dose increased to 30 mg 01/21/2018  Cycle 1 Lutathera 03/03/2018, monthly Sandostatin continued  Cycle 2 Lutathera 04/27/2018, monthly Sandostatin continued  Cycle 3 Lutathera 06/23/2018  Cycle 4 Lutathera 08/18/2018  Restaging dotatate PET scan 09/29/2018- multiple lesions again demonstrated within the liver which accumulate the radiotracer.  The number of lesions which accumulate the tracer has decreased visually compared to the prior exam.  Some lesions are no longer evident along the right hepatic margin.  Remaining measurable lesions have radiotracer activity similar to prior.  No new lesions present.  Central mesenteric mass unchanged and remains avid for radiotracer.  Adjacent lesion within the small bowel SUV max equal 10.5 compared to SUV max equal 15.8.  Smaller lesion previously identified in the upper pelvis small bowel appears decreased in activity.  No new metastatic lesions.  Lesion position between the right psoas muscle and spine SUV max equal to 21.8 compared with SUV max equal 26.2.  2.Diarrhea-likely secondary to carcinoid syndrome, improved with Sandostatin 3.Anorexia/weight loss-improved 4.Prostatic hypertrophy;status post laser vaporization 01/05/2018 5.  Mild pancytopenia following Lutathera treatment     Disposition: Mr. Mines appears unchanged.  He continues monthly Sandostatin.  The chromogranin A level was stable last month.  We will follow-up on the chromogranin A level from today.  He will return for an office and lab visit in 3 months.  He has stable mild pancytopenia following Lutathera.  We will check a ferritin level when he returns in 3 months.  Red cell microcytosis appears to be chronic. I recommend he follow-up with his dentist  to evaluate the oral lesion.  Betsy Coder, MD  01/10/2019  11:38 AM

## 2019-01-10 NOTE — Telephone Encounter (Signed)
Scheduled appt per 4/20 los.

## 2019-01-10 NOTE — Patient Instructions (Signed)

## 2019-01-18 LAB — CHROMOGRANIN A REBASELINE
Chromogranin A (ng/mL): 7213 ng/mL — ABNORMAL HIGH (ref 0.0–101.8)
Chromogranin A: 121 nmol/L — ABNORMAL HIGH (ref 0–5)

## 2019-02-09 ENCOUNTER — Inpatient Hospital Stay: Payer: Medicare Other | Attending: Oncology

## 2019-02-09 ENCOUNTER — Inpatient Hospital Stay: Payer: Medicare Other

## 2019-02-09 ENCOUNTER — Other Ambulatory Visit: Payer: Self-pay

## 2019-02-09 VITALS — BP 147/88 | HR 73 | Temp 98.4°F | Resp 18

## 2019-02-09 DIAGNOSIS — C7B02 Secondary carcinoid tumors of liver: Secondary | ICD-10-CM | POA: Insufficient documentation

## 2019-02-09 DIAGNOSIS — C7B8 Other secondary neuroendocrine tumors: Secondary | ICD-10-CM | POA: Diagnosis not present

## 2019-02-09 MED ORDER — OCTREOTIDE ACETATE 30 MG IM KIT
PACK | INTRAMUSCULAR | Status: AC
  Filled 2019-02-09: qty 1

## 2019-02-09 MED ORDER — OCTREOTIDE ACETATE 30 MG IM KIT
30.0000 mg | PACK | Freq: Once | INTRAMUSCULAR | Status: AC
  Administered 2019-02-09: 30 mg via INTRAMUSCULAR

## 2019-02-09 NOTE — Patient Instructions (Signed)

## 2019-02-16 DIAGNOSIS — R350 Frequency of micturition: Secondary | ICD-10-CM | POA: Diagnosis not present

## 2019-02-16 DIAGNOSIS — R338 Other retention of urine: Secondary | ICD-10-CM | POA: Diagnosis not present

## 2019-02-16 DIAGNOSIS — N401 Enlarged prostate with lower urinary tract symptoms: Secondary | ICD-10-CM | POA: Diagnosis not present

## 2019-02-16 DIAGNOSIS — R972 Elevated prostate specific antigen [PSA]: Secondary | ICD-10-CM | POA: Diagnosis not present

## 2019-03-11 ENCOUNTER — Inpatient Hospital Stay: Payer: Medicare Other | Attending: Oncology

## 2019-03-11 ENCOUNTER — Other Ambulatory Visit: Payer: Self-pay

## 2019-03-11 ENCOUNTER — Other Ambulatory Visit: Payer: Medicare Other

## 2019-03-11 VITALS — BP 129/83 | HR 64 | Temp 99.8°F | Resp 18

## 2019-03-11 DIAGNOSIS — C7B8 Other secondary neuroendocrine tumors: Secondary | ICD-10-CM

## 2019-03-11 DIAGNOSIS — C7A00 Malignant carcinoid tumor of unspecified site: Secondary | ICD-10-CM | POA: Diagnosis not present

## 2019-03-11 DIAGNOSIS — C7B09 Secondary carcinoid tumors of other sites: Secondary | ICD-10-CM | POA: Insufficient documentation

## 2019-03-11 DIAGNOSIS — C7B02 Secondary carcinoid tumors of liver: Secondary | ICD-10-CM | POA: Insufficient documentation

## 2019-03-11 MED ORDER — OCTREOTIDE ACETATE 30 MG IM KIT
30.0000 mg | PACK | Freq: Once | INTRAMUSCULAR | Status: AC
  Administered 2019-03-11: 30 mg via INTRAMUSCULAR

## 2019-03-11 MED ORDER — OCTREOTIDE ACETATE 30 MG IM KIT
PACK | INTRAMUSCULAR | Status: AC
  Filled 2019-03-11: qty 1

## 2019-03-11 NOTE — Patient Instructions (Signed)

## 2019-03-15 DIAGNOSIS — I10 Essential (primary) hypertension: Secondary | ICD-10-CM | POA: Diagnosis not present

## 2019-03-15 DIAGNOSIS — R7303 Prediabetes: Secondary | ICD-10-CM | POA: Diagnosis not present

## 2019-03-15 DIAGNOSIS — E340 Carcinoid syndrome: Secondary | ICD-10-CM | POA: Diagnosis not present

## 2019-03-25 DIAGNOSIS — I10 Essential (primary) hypertension: Secondary | ICD-10-CM | POA: Diagnosis not present

## 2019-03-25 DIAGNOSIS — E876 Hypokalemia: Secondary | ICD-10-CM | POA: Diagnosis not present

## 2019-03-25 DIAGNOSIS — R7303 Prediabetes: Secondary | ICD-10-CM | POA: Diagnosis not present

## 2019-04-02 ENCOUNTER — Other Ambulatory Visit: Payer: Self-pay

## 2019-04-02 ENCOUNTER — Emergency Department (HOSPITAL_COMMUNITY)
Admission: EM | Admit: 2019-04-02 | Discharge: 2019-04-02 | Disposition: A | Discharge status: Home / Self Care | Payer: Medicare Other | Attending: Emergency Medicine | Admitting: Emergency Medicine

## 2019-04-02 ENCOUNTER — Encounter (HOSPITAL_COMMUNITY): Payer: Self-pay | Admitting: *Deleted

## 2019-04-02 DIAGNOSIS — Z79899 Other long term (current) drug therapy: Secondary | ICD-10-CM | POA: Diagnosis not present

## 2019-04-02 DIAGNOSIS — R21 Rash and other nonspecific skin eruption: Secondary | ICD-10-CM | POA: Diagnosis present

## 2019-04-02 DIAGNOSIS — B029 Zoster without complications: Secondary | ICD-10-CM | POA: Insufficient documentation

## 2019-04-02 DIAGNOSIS — I119 Hypertensive heart disease without heart failure: Secondary | ICD-10-CM | POA: Diagnosis not present

## 2019-04-02 MED ORDER — HYDROCODONE-ACETAMINOPHEN 5-325 MG PO TABS: 1.0000 | ORAL_TABLET | Freq: Four times a day (QID) | ORAL | 0 refills | Status: DC | PRN

## 2019-04-02 MED ORDER — VALACYCLOVIR HCL 500 MG PO TABS
1000.0000 mg | ORAL_TABLET | Freq: Once | ORAL | Status: AC
  Administered 2019-04-02: 1000 mg via ORAL
  Filled 2019-04-02: qty 2

## 2019-04-02 MED ORDER — HYDROCODONE-ACETAMINOPHEN 5-325 MG PO TABS
1.0000 | ORAL_TABLET | Freq: Once | ORAL | Status: DC
  Filled 2019-04-02: qty 1

## 2019-04-02 MED ORDER — VALACYCLOVIR HCL 1 G PO TABS: 1000.0000 mg | ORAL_TABLET | Freq: Three times a day (TID) | ORAL | 0 refills | Status: DC

## 2019-04-02 NOTE — ED Triage Notes (Signed)
Pt presents with blisters on left back and chest (Shingles) Remains closed

## 2019-04-02 NOTE — ED Provider Notes (Signed)
[facility] DEPT Provider Note   CSN: [id number] Arrival date & time: 04/02/19  2035     History   Chief Complaint Chief Complaint  Patient presents with  . Herpes Zoster    HPI Charles Schmidt is a 78 y.o. male.     HPI Patient presents with concern of rash, pain. Patient states that he was generally well until about a week ago, when he developed painful rash in the left hemithorax, just below his nipple. Since that time he has had no relief in spite of using Tums, Tylenol, Vicks VapoRub. No fever, chills, nausea, vomiting, chest pain, dyspnea.  Past Medical History:  Diagnosis Date  . BPH with obstruction/lower urinary tract symptoms   . Cataract, mature    12-28-2017  per pt right eye, legally blind  . Diarrhea concurrent with and due to carcinoid syndrome ([place])    12-28-2017 improvement since started Sandostatin monthly  . ED (erectile dysfunction)   . Elevated PSA   . Family history of cancer in son   . Family history of prostate cancer   . Foley catheter in place   . Glaucoma, left eye   . History of colon polyps   . History of urinary retention    2015; 2016; 01/ 2019  . Hypertension   . Metastatic malignant neuroendocrine tumor to liver [facility]) oncoloigst-  dr Benay Spice   dx 10-23-2017 by liver bx-- WHO grade 2; small bowel mesenteric mass, cirrhosis, mets left lower lobe solitary nodule ,  periaortic and paraspinal lymph nodes METS--  started monthly Sandostatin 11-20-2017  . Microcytic anemia   . RBBB (right bundle branch block with left anterior fascicular block)   . Urinary retention   . Venous reflux     Patient Active Problem List   Diagnosis Date Noted  . Genetic testing 12/18/2017  . Family history of cancer in son   . Family history of prostate cancer   . Metastatic malignant neuroendocrine tumor to liver ([place]) 10/28/2017  . Essential hypertension (primary) 05/04/2017  . Hypokalemia 05/04/2017  . Benign prostatic  hyperplasia 05/04/2017  . Glaucoma 05/04/2017  . Benign hypertensive heart disease without heart failure 05/04/2017  . Microcytic anemia 05/04/2017  . Prediabetes 05/04/2017  . Hypocalcemia 05/04/2017  . Psoriasis 05/04/2017    Past Surgical History:  Procedure Laterality Date  . INGUINAL HERNIA REPAIR  1990s   unsure side  . IR US GUIDE BX ASP/DRAIN  10/23/2017  . QUADRICEPS TENDON REPAIR Left 01-08-2011   dr Veverly Fells  . THULIUM LASER TURP (TRANSURETHRAL RESECTION OF PROSTATE) N/A 01/05/2018   Procedure: Marcelino Duster LASER OF THE PROSTATE;  Surgeon: Festus Aloe, MD;  Location: [facility];  Service: Urology;  Laterality: N/A;  . TONSILLECTOMY  child        Home Medications    Prior to Admission medications   Medication Sig Start Date End Date Taking? Authorizing Provider  amLODipine (NORVASC) 5 MG tablet Take 1 tablet (5 mg total) by mouth at bedtime. 05/01/18   Jacqualine Mau, NP  dorzolamide-timolol (COSOPT) 22.3-6.8 MG/ML ophthalmic solution Place 1 drop into the left eye 2 (two) times daily.     [provider]  finasteride (PROSCAR) 5 MG tablet Take 5 mg by mouth at bedtime.     [provider]  hydrochlorothiazide (HYDRODIURIL) 12.5 MG tablet Take 12.5 mg by mouth daily. 11/03/18   [provider]  HYDROcodone-acetaminophen (NORCO/VICODIN) 5-325 MG tablet Take 1 tablet by mouth every 6 (  six) hours as needed. 04/02/19   Carmin Muskrat, MD  loperamide (IMODIUM) 2 MG capsule Take 1 mg by mouth 2 (two) times daily.     [provider]  Multiple Vitamin (MULTIVITAMIN WITH MINERALS) TABS tablet Take 1 tablet by mouth at bedtime. Centrum Silver    [provider]  potassium chloride SA (K-DUR,KLOR-CON) 20 MEQ tablet Take 20 mEq by mouth daily. 07/09/18   [provider]  Saw Palmetto, Serenoa repens, (SAW PALMETTO PO) Take 2 tablets by mouth at bedtime. Also takes 1 tablet in am    [provider]   tamsulosin (FLOMAX) 0.4 MG CAPS capsule Take 1 capsule (0.4 mg total) by mouth daily after supper. Patient taking differently: Take 0.4 mg by mouth at bedtime.  10/22/14   [name], MD  Travoprost, BAK Free, (TRAVATAN) 0.004 % SOLN ophthalmic solution Place 1 drop into the left eye at bedtime.     [provider]  valACYclovir (VALTREX) 1000 MG tablet Take 1 tablet (1,000 mg total) by mouth 3 (three) times daily. 04/02/19   Carmin Muskrat, MD    Family History Family History  Problem Relation Age of Onset  . Hypertension Mother   . Stroke Father   . Prostate cancer Father        'slow growing'  . Kidney Stones Sister   . Cancer Son 53       neuroendocrine tumors of small intestine    Social History Social History   Tobacco Use  . Smoking status: Never Smoker  . Smokeless tobacco: Never Used  Substance Use Topics  . Alcohol use: Not Currently    Alcohol/week: 1.0 standard drinks    Types: 1 Standard drinks or equivalent per week    Comment: week  . Drug use: No     Allergies   Patient has no known allergies.   Review of Systems Review of Systems  Constitutional:       Per HPI, otherwise negative  HENT:       Per HPI, otherwise negative  Respiratory:       Per HPI, otherwise negative  Cardiovascular:       Per HPI, otherwise negative  Gastrointestinal: Negative for vomiting.  Endocrine:       Negative aside from HPI  Genitourinary:       Neg aside from HPI   Musculoskeletal:       Per HPI, otherwise negative  Skin: Positive for rash.  Neurological: Negative for syncope.     Physical Exam Updated Vital Signs BP 132/88 (BP Location: Left Arm)   Pulse (!) 104   Temp 98 F (36.7 C) (Oral)   Resp 18   Ht 6\' 4"  (1.93 m)   Wt 103 kg   SpO2 99%   BMI 27.64 kg/m   Physical Exam Vitals signs and nursing note reviewed.  Constitutional:      General: He is not in acute distress.    Appearance: He is well-developed.  HENT:     Head:  Normocephalic and atraumatic.  Eyes:     Conjunctiva/sclera: Conjunctivae normal.  Pulmonary:     Effort: Pulmonary effort is normal. No respiratory distress.     Breath sounds: No stridor.  Abdominal:     General: There is no distension.  Skin:    General: Skin is warm and dry.     Comments: T7 dermatomal rash with vesicles, no spreading erythema beyond the dermatome  Neurological:     Mental Status:  He is alert and oriented to person, place, and time.      ED Treatments / Results   Procedures Procedures (including critical care time)  Medications Ordered in ED Medications  valACYclovir (VALTREX) tablet 1,000 mg (has no administration in time range)  HYDROcodone-acetaminophen (NORCO/VICODIN) 5-325 MG per tablet 1 tablet (has no administration in time range)     Initial Impression / Assessment and Plan / ED Course  I have reviewed the triage vital signs and the nursing notes.  Pertinent labs & imaging results that were available during my care of the patient were reviewed by me and considered in my medical decision making (see chart for details).  Elderly male presents with 1 week of painful dermatomal rash consistent with shingles. No evidence for disseminated disease, no evidence for bacteremia, sepsis. Patient started on antivirals, analgesia, discharged in stable condition with outpatient follow-up. Final Clinical Impressions(s) / ED Diagnoses   Final diagnoses:  Herpes zoster without complication    ED Discharge Orders         Ordered    HYDROcodone-acetaminophen (NORCO/VICODIN) 5-325 MG tablet  Every 6 hours PRN     04/02/19 0943    valACYclovir (VALTREX) 1000 MG tablet  3 times daily     04/02/19 0943           Carmin Muskrat, MD 04/02/19 0945

## 2019-04-02 NOTE — ED Notes (Signed)
Vicodin not given to pt driving. He is aware he has a prescription for 2 meds at the drug store.

## 2019-04-02 NOTE — Discharge Instructions (Signed)
Please monitor your condition carefully, and do not hesitate to return here for concerning changes. °

## 2019-04-11 ENCOUNTER — Inpatient Hospital Stay: Payer: Medicare Other

## 2019-04-11 ENCOUNTER — Telehealth: Payer: Self-pay | Admitting: Oncology

## 2019-04-11 ENCOUNTER — Inpatient Hospital Stay: Payer: Medicare Other | Attending: Oncology

## 2019-04-11 ENCOUNTER — Inpatient Hospital Stay: Payer: Medicare Other | Admitting: Oncology

## 2019-04-11 DIAGNOSIS — C7A00 Malignant carcinoid tumor of unspecified site: Secondary | ICD-10-CM | POA: Insufficient documentation

## 2019-04-11 DIAGNOSIS — C7B09 Secondary carcinoid tumors of other sites: Secondary | ICD-10-CM | POA: Insufficient documentation

## 2019-04-11 DIAGNOSIS — C7B02 Secondary carcinoid tumors of liver: Secondary | ICD-10-CM | POA: Insufficient documentation

## 2019-04-11 NOTE — Telephone Encounter (Signed)
Scheduled appt per 7/20 sch message- pt is aware of appt date and time

## 2019-04-12 ENCOUNTER — Other Ambulatory Visit: Payer: Self-pay

## 2019-04-12 ENCOUNTER — Inpatient Hospital Stay: Payer: Medicare Other

## 2019-04-12 VITALS — BP 127/83 | HR 87 | Temp 98.3°F | Resp 18

## 2019-04-12 DIAGNOSIS — C7A00 Malignant carcinoid tumor of unspecified site: Secondary | ICD-10-CM | POA: Diagnosis not present

## 2019-04-12 DIAGNOSIS — C7B8 Other secondary neuroendocrine tumors: Secondary | ICD-10-CM

## 2019-04-12 DIAGNOSIS — C7B02 Secondary carcinoid tumors of liver: Secondary | ICD-10-CM | POA: Diagnosis not present

## 2019-04-12 DIAGNOSIS — C7B09 Secondary carcinoid tumors of other sites: Secondary | ICD-10-CM | POA: Diagnosis not present

## 2019-04-12 LAB — CBC WITH DIFFERENTIAL (CANCER CENTER ONLY)
Abs Immature Granulocytes: 0 10*3/uL (ref 0.00–0.07)
Basophils Absolute: 0 10*3/uL (ref 0.0–0.1)
Basophils Relative: 0 %
Eosinophils Absolute: 0.1 10*3/uL (ref 0.0–0.5)
Eosinophils Relative: 2 %
HCT: 36.4 % — ABNORMAL LOW (ref 39.0–52.0)
Hemoglobin: 12.1 g/dL — ABNORMAL LOW (ref 13.0–17.0)
Immature Granulocytes: 0 %
Lymphocytes Relative: 34 %
Lymphs Abs: 1.2 10*3/uL (ref 0.7–4.0)
MCH: 25.6 pg — ABNORMAL LOW (ref 26.0–34.0)
MCHC: 33.2 g/dL (ref 30.0–36.0)
MCV: 77 fL — ABNORMAL LOW (ref 80.0–100.0)
Monocytes Absolute: 0.4 10*3/uL (ref 0.1–1.0)
Monocytes Relative: 12 %
Neutro Abs: 1.9 10*3/uL (ref 1.7–7.7)
Neutrophils Relative %: 52 %
Platelet Count: 223 10*3/uL (ref 150–400)
RBC: 4.73 MIL/uL (ref 4.22–5.81)
RDW: 17.7 % — ABNORMAL HIGH (ref 11.5–15.5)
WBC Count: 3.6 10*3/uL — ABNORMAL LOW (ref 4.0–10.5)
nRBC: 0 % (ref 0.0–0.2)

## 2019-04-12 LAB — FERRITIN: Ferritin: 207 ng/mL (ref 24–336)

## 2019-04-12 LAB — CMP (CANCER CENTER ONLY)
ALT: 19 U/L (ref 0–44)
AST: 27 U/L (ref 15–41)
Albumin: 3.5 g/dL (ref 3.5–5.0)
Alkaline Phosphatase: 134 U/L — ABNORMAL HIGH (ref 38–126)
Anion gap: 10 (ref 5–15)
BUN: 15 mg/dL (ref 8–23)
CO2: 28 mmol/L (ref 22–32)
Calcium: 9.3 mg/dL (ref 8.9–10.3)
Chloride: 104 mmol/L (ref 98–111)
Creatinine: 1.05 mg/dL (ref 0.61–1.24)
GFR, Est AFR Am: >60 mL/min (ref >60)
GFR, Estimated: >60 mL/min (ref >60)
Glucose, Bld: 105 mg/dL — ABNORMAL HIGH (ref 70–99)
Potassium: 3.3 mmol/L — ABNORMAL LOW (ref 3.5–5.1)
Sodium: 142 mmol/L (ref 135–145)
Total Bilirubin: 0.4 mg/dL (ref 0.3–1.2)
Total Protein: 7.8 g/dL (ref 6.5–8.1)

## 2019-04-12 MED ORDER — OCTREOTIDE ACETATE 30 MG IM KIT
PACK | INTRAMUSCULAR | Status: AC
  Filled 2019-04-12: qty 1

## 2019-04-12 MED ORDER — OCTREOTIDE ACETATE 30 MG IM KIT
30.0000 mg | PACK | Freq: Once | INTRAMUSCULAR | Status: AC
  Administered 2019-04-12: 30 mg via INTRAMUSCULAR

## 2019-04-12 NOTE — Patient Instructions (Signed)
Octreotide injection solution °What is this medicine? °OCTREOTIDE (ok TREE oh tide) is used to reduce blood levels of growth hormone in patients with a condition called acromegaly. This medicine also reduces flushing and watery diarrhea caused by certain types of cancer. °This medicine may be used for other purposes; ask your health care provider or pharmacist if you have questions. °COMMON BRAND NAME(S): Bynfezia, Sandostatin, Sandostatin LAR °What should I tell my health care provider before I take this medicine? °They need to know if you have any of these conditions: °· diabetes °· gallbladder disease °· kidney disease °· liver disease °· an unusual or allergic reaction to octreotide, other medicines, foods, dyes, or preservatives °· pregnant or trying to get pregnant °· breast-feeding °How should I use this medicine? °This medicine is for injection under the skin or into a vein (only in emergency situations). It is usually given by a health care professional in a hospital or clinic setting. °If you get this medicine at home, you will be taught how to prepare and give this medicine. Allow the injection solution to come to room temperature before use. Do not warm it artificially. Use exactly as directed. Take your medicine at regular intervals. Do not take your medicine more often than directed. °It is important that you put your used needles and syringes in a special sharps container. Do not put them in a trash can. If you do not have a sharps container, call your pharmacist or healthcare provider to get one. °Talk to your pediatrician regarding the use of this medicine in children. Special care may be needed. °Overdosage: If you think you have taken too much of this medicine contact a poison control center or emergency room at once. °NOTE: This medicine is only for you. Do not share this medicine with others. °What if I miss a dose? °If you miss a dose, take it as soon as you can. If it is almost time for your  next dose, take only that dose. Do not take double or extra doses. °What may interact with this medicine? °Do not take this medicine with any of the following medications: °· cisapride °· droperidol °· general anesthetics °· grepafloxacin °· perphenazine °· thioridazine °This medicine may also interact with the following medications: °· bromocriptine °· cyclosporine °· diuretics °· medicines for blood pressure, heart disease, irregular heart beat °· medicines for diabetes, including insulin °· quinidine °This list may not describe all possible interactions. Give your health care provider a list of all the medicines, herbs, non-prescription drugs, or dietary supplements you use. Also tell them if you smoke, drink alcohol, or use illegal drugs. Some items may interact with your medicine. °What should I watch for while using this medicine? °Visit your doctor or health care professional for regular checks on your progress. °To help reduce irritation at the injection site, use a different site for each injection and make sure the solution is at room temperature before use. °This medicine may cause decreases in blood sugar. Signs of low blood sugar include chills, cool, pale skin or cold sweats, drowsiness, extreme hunger, fast heartbeat, headache, nausea, nervousness or anxiety, shakiness, trembling, unsteadiness, tiredness, or weakness. Contact your doctor or health care professional right away if you experience any of these symptoms. °This medicine may increase blood sugar. Ask your healthcare provider if changes in diet or medicines are needed if you have diabetes. °This medicine may cause a decrease in vitamin B12. You should make sure that you get enough vitamin B12   while you are taking this medicine. Discuss the foods you eat and the vitamins you take with your health care professional. °What side effects may I notice from receiving this medicine? °Side effects that you should report to your doctor or health care  professional as soon as possible: °· allergic reactions like skin rash, itching or hives, swelling of the face, lips, or tongue °· decreases in blood sugar °· changes in heart rate °· severe stomach pain °·  °signs and symptoms of high blood sugar such as being more thirsty or hungry or having to urinate more than normal. You may also feel very tired or have blurry vision. °Side effects that usually do not require medical attention (report to your doctor or health care professional if they continue or are bothersome): °· diarrhea or constipation °· gas or stomach pain °· nausea, vomiting °· pain, redness, swelling and irritation at site where injected °This list may not describe all possible side effects. Call your doctor for medical advice about side effects. You may report side effects to FDA at 1-800-FDA-1088. °Where should I keep my medicine? °Keep out of the reach of children. °Store in a refrigerator between 2 and 8 degrees C (36 and 46 degrees F). Protect from light. Allow to come to room temperature naturally. Do not use artificial heat. If protected from light, the injection may be stored at room temperature between 20 and 30 degrees C (70 and 86 degrees F) for 14 days. After the initial use, throw away any unused portion of a multiple dose vial after 14 days. Throw away unused portions of the ampules after use. °NOTE: This sheet is a summary. It may not cover all possible information. If you have questions about this medicine, talk to your doctor, pharmacist, or health care provider. °© 2020 [employer] (2018-06-17 08:07:09) ° °

## 2019-04-14 LAB — CHROMOGRANIN A: Chromogranin A (ng/mL): 7832 ng/mL — ABNORMAL HIGH (ref 0.0–101.8)

## 2019-04-25 DIAGNOSIS — I10 Essential (primary) hypertension: Secondary | ICD-10-CM | POA: Diagnosis not present

## 2019-04-25 DIAGNOSIS — B029 Zoster without complications: Secondary | ICD-10-CM | POA: Diagnosis not present

## 2019-05-13 DIAGNOSIS — I10 Essential (primary) hypertension: Secondary | ICD-10-CM | POA: Diagnosis not present

## 2019-05-13 DIAGNOSIS — Z0000 Encounter for general adult medical examination without abnormal findings: Secondary | ICD-10-CM | POA: Diagnosis not present

## 2019-05-13 DIAGNOSIS — E340 Carcinoid syndrome: Secondary | ICD-10-CM | POA: Diagnosis not present

## 2019-05-13 DIAGNOSIS — R7303 Prediabetes: Secondary | ICD-10-CM | POA: Diagnosis not present

## 2019-05-13 DIAGNOSIS — B029 Zoster without complications: Secondary | ICD-10-CM | POA: Diagnosis not present

## 2019-05-16 ENCOUNTER — Other Ambulatory Visit: Payer: Self-pay

## 2019-05-16 ENCOUNTER — Inpatient Hospital Stay (HOSPITAL_BASED_OUTPATIENT_CLINIC_OR_DEPARTMENT_OTHER): Payer: Medicare Other | Admitting: Oncology

## 2019-05-16 ENCOUNTER — Inpatient Hospital Stay: Payer: Medicare Other | Attending: Oncology

## 2019-05-16 ENCOUNTER — Inpatient Hospital Stay: Payer: Medicare Other

## 2019-05-16 VITALS — BP 145/87 | HR 75 | Temp 99.6°F | Resp 18 | Ht 76.0 in | Wt 217.1 lb

## 2019-05-16 DIAGNOSIS — R630 Anorexia: Secondary | ICD-10-CM | POA: Diagnosis not present

## 2019-05-16 DIAGNOSIS — C7B8 Other secondary neuroendocrine tumors: Secondary | ICD-10-CM | POA: Diagnosis not present

## 2019-05-16 DIAGNOSIS — R911 Solitary pulmonary nodule: Secondary | ICD-10-CM | POA: Insufficient documentation

## 2019-05-16 DIAGNOSIS — D61818 Other pancytopenia: Secondary | ICD-10-CM | POA: Insufficient documentation

## 2019-05-16 DIAGNOSIS — R21 Rash and other nonspecific skin eruption: Secondary | ICD-10-CM | POA: Insufficient documentation

## 2019-05-16 DIAGNOSIS — Z79899 Other long term (current) drug therapy: Secondary | ICD-10-CM | POA: Diagnosis not present

## 2019-05-16 DIAGNOSIS — N400 Enlarged prostate without lower urinary tract symptoms: Secondary | ICD-10-CM | POA: Diagnosis not present

## 2019-05-16 DIAGNOSIS — C7A019 Malignant carcinoid tumor of the small intestine, unspecified portion: Secondary | ICD-10-CM | POA: Insufficient documentation

## 2019-05-16 DIAGNOSIS — C7B02 Secondary carcinoid tumors of liver: Secondary | ICD-10-CM | POA: Diagnosis not present

## 2019-05-16 DIAGNOSIS — R197 Diarrhea, unspecified: Secondary | ICD-10-CM | POA: Diagnosis not present

## 2019-05-16 MED ORDER — OCTREOTIDE ACETATE 30 MG IM KIT
30.0000 mg | PACK | Freq: Once | INTRAMUSCULAR | Status: AC
  Administered 2019-05-16: 30 mg via INTRAMUSCULAR

## 2019-05-16 MED ORDER — OCTREOTIDE ACETATE 30 MG IM KIT
PACK | INTRAMUSCULAR | Status: AC
  Filled 2019-05-16: qty 1

## 2019-05-16 NOTE — Patient Instructions (Signed)
Octreotide injection solution °What is this medicine? °OCTREOTIDE (ok TREE oh tide) is used to reduce blood levels of growth hormone in patients with a condition called acromegaly. This medicine also reduces flushing and watery diarrhea caused by certain types of cancer. °This medicine may be used for other purposes; ask your health care provider or pharmacist if you have questions. °COMMON BRAND NAME(S): Bynfezia, Sandostatin, Sandostatin LAR °What should I tell my health care provider before I take this medicine? °They need to know if you have any of these conditions: °· diabetes °· gallbladder disease °· kidney disease °· liver disease °· an unusual or allergic reaction to octreotide, other medicines, foods, dyes, or preservatives °· pregnant or trying to get pregnant °· breast-feeding °How should I use this medicine? °This medicine is for injection under the skin or into a vein (only in emergency situations). It is usually given by a health care professional in a hospital or clinic setting. °If you get this medicine at home, you will be taught how to prepare and give this medicine. Allow the injection solution to come to room temperature before use. Do not warm it artificially. Use exactly as directed. Take your medicine at regular intervals. Do not take your medicine more often than directed. °It is important that you put your used needles and syringes in a special sharps container. Do not put them in a trash can. If you do not have a sharps container, call your pharmacist or healthcare provider to get one. °Talk to your pediatrician regarding the use of this medicine in children. Special care may be needed. °Overdosage: If you think you have taken too much of this medicine contact a poison control center or emergency room at once. °NOTE: This medicine is only for you. Do not share this medicine with others. °What if I miss a dose? °If you miss a dose, take it as soon as you can. If it is almost time for your  next dose, take only that dose. Do not take double or extra doses. °What may interact with this medicine? °Do not take this medicine with any of the following medications: °· cisapride °· droperidol °· general anesthetics °· grepafloxacin °· perphenazine °· thioridazine °This medicine may also interact with the following medications: °· bromocriptine °· cyclosporine °· diuretics °· medicines for blood pressure, heart disease, irregular heart beat °· medicines for diabetes, including insulin °· quinidine °This list may not describe all possible interactions. Give your health care provider a list of all the medicines, herbs, non-prescription drugs, or dietary supplements you use. Also tell them if you smoke, drink alcohol, or use illegal drugs. Some items may interact with your medicine. °What should I watch for while using this medicine? °Visit your doctor or health care professional for regular checks on your progress. °To help reduce irritation at the injection site, use a different site for each injection and make sure the solution is at room temperature before use. °This medicine may cause decreases in blood sugar. Signs of low blood sugar include chills, cool, pale skin or cold sweats, drowsiness, extreme hunger, fast heartbeat, headache, nausea, nervousness or anxiety, shakiness, trembling, unsteadiness, tiredness, or weakness. Contact your doctor or health care professional right away if you experience any of these symptoms. °This medicine may increase blood sugar. Ask your healthcare provider if changes in diet or medicines are needed if you have diabetes. °This medicine may cause a decrease in vitamin B12. You should make sure that you get enough vitamin B12   while you are taking this medicine. Discuss the foods you eat and the vitamins you take with your health care professional. °What side effects may I notice from receiving this medicine? °Side effects that you should report to your doctor or health care  professional as soon as possible: °· allergic reactions like skin rash, itching or hives, swelling of the face, lips, or tongue °· decreases in blood sugar °· changes in heart rate °· severe stomach pain °·  °signs and symptoms of high blood sugar such as being more thirsty or hungry or having to urinate more than normal. You may also feel very tired or have blurry vision. °Side effects that usually do not require medical attention (report to your doctor or health care professional if they continue or are bothersome): °· diarrhea or constipation °· gas or stomach pain °· nausea, vomiting °· pain, redness, swelling and irritation at site where injected °This list may not describe all possible side effects. Call your doctor for medical advice about side effects. You may report side effects to FDA at 1-800-FDA-1088. °Where should I keep my medicine? °Keep out of the reach of children. °Store in a refrigerator between 2 and 8 degrees C (36 and 46 degrees F). Protect from light. Allow to come to room temperature naturally. Do not use artificial heat. If protected from light, the injection may be stored at room temperature between 20 and 30 degrees C (70 and 86 degrees F) for 14 days. After the initial use, throw away any unused portion of a multiple dose vial after 14 days. Throw away unused portions of the ampules after use. °NOTE: This sheet is a summary. It may not cover all possible information. If you have questions about this medicine, talk to your doctor, pharmacist, or health care provider. °© 2020 [employer] (2018-06-17 08:07:09) ° °

## 2019-05-16 NOTE — Progress Notes (Signed)
[facility] OFFICE PROGRESS NOTE   Diagnosis: Carcinoid tumor  INTERVAL HISTORY:   Charles Schmidt continues monthly Sandostatin.  No diarrhea.  He reports a poor appetite, but he is eating.  He was diagnosed with a zoster rash when he presented to the emergency room on 04/02/2019.  He completed a course of Valtrex.  He reports the rash was painful and has improved.  Objective:  Vital signs in last 24 hours:  Blood pressure (!) 145/87, pulse 75, temperature 99.6 F (37.6 C), temperature source Oral, resp. rate 18, height 6' 4"  (1.93 m), weight 217 lb 1.6 oz (98.5 kg), SpO2 100 %.  Resp: Decreased breath sounds at the right lower posterior chest compared to the left side, no respiratory distress Cardio: Regular rate and rhythm GI: The liver is palpable throughout the upper abdomen, nontender Vascular: No leg edema  Skin: Healed zoster rash of the left chest and back    Lab Results:  Lab Results  Component Value Date   WBC 3.6 (L) 04/12/2019   HGB 12.1 (L) 04/12/2019   HCT 36.4 (L) 04/12/2019   MCV 77.0 (L) 04/12/2019   PLT 223 04/12/2019   NEUTROABS 1.9 04/12/2019    CMP  Lab Results  Component Value Date   NA 142 04/12/2019   K 3.3 (L) 04/12/2019   CL 104 04/12/2019   CO2 28 04/12/2019   GLUCOSE 105 (H) 04/12/2019   BUN 15 04/12/2019   CREATININE 1.05 04/12/2019   CALCIUM 9.3 04/12/2019   PROT 7.8 04/12/2019   ALBUMIN 3.5 04/12/2019   AST 27 04/12/2019   ALT 19 04/12/2019   ALKPHOS 134 (H) 04/12/2019   BILITOT 0.4 04/12/2019   GFRNONAA >60 04/12/2019   GFRAA >60 04/12/2019   Chromogranin A on 04/12/2019: 7832  Medications: I have reviewed the patient's current medications.   Assessment/Plan: 1.Metastatic carcinoid tumor, biopsy of a liver lesion 10/23/2017 consistent with a well-differentiated neuroendocrine neoplasm, WHO grade 2,ki-6710%  CT abdomen/pelvis 10/13/2017-extensive liver metastases, cirrhosis, soft tissue mass in the small bowel  mesentery versus a primary small bowel tumor, tiny lucent lesions in the pelvic bones  Elevated chromogranin A and 24-hour urine 5-HIAA  CT chest 11/24/2017-subpleural nodule in the left lower lobe with associated radiotracer activity on comparison DOTATATEPET scan. No additional evidence of thoracic metastasis.  DOTATATE PET scan3/01/2018-intense radiotracer accumulation with innumerable confluent hepatic metastasis; intense radiotracer activity within central mesenteric mass; 2 foci of uptake associated with the small bowel; intense radiotracer activity associated periaortic and paraspinal lymph nodes; more distant solitary small metastasis within the pleural space left lower lobe.  Monthly Sandostatin initiated 11/26/2017, dose increased to 30 mg 01/21/2018  Cycle 1 Lutathera 03/03/2018, monthly Sandostatin continued  Cycle 2 Lutathera 04/27/2018, monthly Sandostatin continued  Cycle 3 Lutathera 06/23/2018  Cycle 4 Lutathera 08/18/2018  Restaging dotatate PET scan 09/29/2018- multiple lesions again demonstrated within the liver which accumulate the radiotracer.  The number of lesions which accumulate the tracer has decreased visually compared to the prior exam.  Some lesions are no longer evident along the right hepatic margin.  Remaining measurable lesions have radiotracer activity similar to prior.  No new lesions present.  Central mesenteric mass unchanged and remains avid for radiotracer.  Adjacent lesion within the small bowel SUV max equal 10.5 compared to SUV max equal 15.8.  Smaller lesion previously identified in the upper pelvis small bowel appears decreased in activity.  No new metastatic lesions.  Lesion position between the right psoas muscle and  spine SUV max equal to 21.8 compared with SUV max equal 26.2.  2.Diarrhea-likely secondary to carcinoid syndrome, improved with Sandostatin 3.Anorexia/weight loss-improved 4.Prostatic hypertrophy;status post laser vaporization 01/05/2018  5.  Mild pancytopenia following Lutathera treatment 6.  Thoracic dermatomal zoster rash 04/02/2019 treated with Valtrex   Disposition: Charles Schmidt appears unchanged.  We will continue monthly Sandostatin.  We will check a chromogranin a level when he returns for an office visit month.  Betsy Coder, MD  05/16/2019  10:10 AM

## 2019-05-17 ENCOUNTER — Telehealth: Payer: Self-pay | Admitting: Oncology

## 2019-05-17 NOTE — Telephone Encounter (Signed)
Called. Not able to leave msg. Mailed prinout

## 2019-06-13 ENCOUNTER — Other Ambulatory Visit: Payer: Self-pay

## 2019-06-13 ENCOUNTER — Inpatient Hospital Stay: Payer: Medicare Other

## 2019-06-13 ENCOUNTER — Inpatient Hospital Stay: Payer: Medicare Other | Attending: Oncology

## 2019-06-13 VITALS — BP 117/77 | HR 79 | Temp 98.5°F | Resp 16

## 2019-06-13 DIAGNOSIS — C7B8 Other secondary neuroendocrine tumors: Secondary | ICD-10-CM

## 2019-06-13 DIAGNOSIS — C7A019 Malignant carcinoid tumor of the small intestine, unspecified portion: Secondary | ICD-10-CM | POA: Insufficient documentation

## 2019-06-13 DIAGNOSIS — C7B02 Secondary carcinoid tumors of liver: Secondary | ICD-10-CM | POA: Diagnosis not present

## 2019-06-13 MED ORDER — OCTREOTIDE ACETATE 30 MG IM KIT
PACK | INTRAMUSCULAR | Status: AC
  Filled 2019-06-13: qty 1

## 2019-06-13 MED ORDER — OCTREOTIDE ACETATE 30 MG IM KIT
30.0000 mg | PACK | Freq: Once | INTRAMUSCULAR | Status: AC
  Administered 2019-06-13: 11:00:00 30 mg via INTRAMUSCULAR

## 2019-06-13 NOTE — Patient Instructions (Signed)
Octreotide injection solution °What is this medicine? °OCTREOTIDE (ok TREE oh tide) is used to reduce blood levels of growth hormone in patients with a condition called acromegaly. This medicine also reduces flushing and watery diarrhea caused by certain types of cancer. °This medicine may be used for other purposes; ask your health care provider or pharmacist if you have questions. °COMMON BRAND NAME(S): Bynfezia, Sandostatin, Sandostatin LAR °What should I tell my health care provider before I take this medicine? °They need to know if you have any of these conditions: °· diabetes °· gallbladder disease °· kidney disease °· liver disease °· an unusual or allergic reaction to octreotide, other medicines, foods, dyes, or preservatives °· pregnant or trying to get pregnant °· breast-feeding °How should I use this medicine? °This medicine is for injection under the skin or into a vein (only in emergency situations). It is usually given by a health care professional in a hospital or clinic setting. °If you get this medicine at home, you will be taught how to prepare and give this medicine. Allow the injection solution to come to room temperature before use. Do not warm it artificially. Use exactly as directed. Take your medicine at regular intervals. Do not take your medicine more often than directed. °It is important that you put your used needles and syringes in a special sharps container. Do not put them in a trash can. If you do not have a sharps container, call your pharmacist or healthcare provider to get one. °Talk to your pediatrician regarding the use of this medicine in children. Special care may be needed. °Overdosage: If you think you have taken too much of this medicine contact a poison control center or emergency room at once. °NOTE: This medicine is only for you. Do not share this medicine with others. °What if I miss a dose? °If you miss a dose, take it as soon as you can. If it is almost time for your  next dose, take only that dose. Do not take double or extra doses. °What may interact with this medicine? °Do not take this medicine with any of the following medications: °· cisapride °· droperidol °· general anesthetics °· grepafloxacin °· perphenazine °· thioridazine °This medicine may also interact with the following medications: °· bromocriptine °· cyclosporine °· diuretics °· medicines for blood pressure, heart disease, irregular heart beat °· medicines for diabetes, including insulin °· quinidine °This list may not describe all possible interactions. Give your health care provider a list of all the medicines, herbs, non-prescription drugs, or dietary supplements you use. Also tell them if you smoke, drink alcohol, or use illegal drugs. Some items may interact with your medicine. °What should I watch for while using this medicine? °Visit your doctor or health care professional for regular checks on your progress. °To help reduce irritation at the injection site, use a different site for each injection and make sure the solution is at room temperature before use. °This medicine may cause decreases in blood sugar. Signs of low blood sugar include chills, cool, pale skin or cold sweats, drowsiness, extreme hunger, fast heartbeat, headache, nausea, nervousness or anxiety, shakiness, trembling, unsteadiness, tiredness, or weakness. Contact your doctor or health care professional right away if you experience any of these symptoms. °This medicine may increase blood sugar. Ask your healthcare provider if changes in diet or medicines are needed if you have diabetes. °This medicine may cause a decrease in vitamin B12. You should make sure that you get enough vitamin B12   while you are taking this medicine. Discuss the foods you eat and the vitamins you take with your health care professional. °What side effects may I notice from receiving this medicine? °Side effects that you should report to your doctor or health care  professional as soon as possible: °· allergic reactions like skin rash, itching or hives, swelling of the face, lips, or tongue °· decreases in blood sugar °· changes in heart rate °· severe stomach pain °·  °signs and symptoms of high blood sugar such as being more thirsty or hungry or having to urinate more than normal. You may also feel very tired or have blurry vision. °Side effects that usually do not require medical attention (report to your doctor or health care professional if they continue or are bothersome): °· diarrhea or constipation °· gas or stomach pain °· nausea, vomiting °· pain, redness, swelling and irritation at site where injected °This list may not describe all possible side effects. Call your doctor for medical advice about side effects. You may report side effects to FDA at 1-800-FDA-1088. °Where should I keep my medicine? °Keep out of the reach of children. °Store in a refrigerator between 2 and 8 degrees C (36 and 46 degrees F). Protect from light. Allow to come to room temperature naturally. Do not use artificial heat. If protected from light, the injection may be stored at room temperature between 20 and 30 degrees C (70 and 86 degrees F) for 14 days. After the initial use, throw away any unused portion of a multiple dose vial after 14 days. Throw away unused portions of the ampules after use. °NOTE: This sheet is a summary. It may not cover all possible information. If you have questions about this medicine, talk to your doctor, pharmacist, or health care provider. °© 2020 [employer] (2018-06-17 08:07:09) ° °

## 2019-06-16 DIAGNOSIS — H401122 Primary open-angle glaucoma, left eye, moderate stage: Secondary | ICD-10-CM | POA: Diagnosis not present

## 2019-06-27 DIAGNOSIS — B029 Zoster without complications: Secondary | ICD-10-CM | POA: Diagnosis not present

## 2019-06-27 DIAGNOSIS — E876 Hypokalemia: Secondary | ICD-10-CM | POA: Diagnosis not present

## 2019-06-27 DIAGNOSIS — I10 Essential (primary) hypertension: Secondary | ICD-10-CM | POA: Diagnosis not present

## 2019-06-27 DIAGNOSIS — Z23 Encounter for immunization: Secondary | ICD-10-CM | POA: Diagnosis not present

## 2019-06-27 DIAGNOSIS — R7303 Prediabetes: Secondary | ICD-10-CM | POA: Diagnosis not present

## 2019-07-11 ENCOUNTER — Other Ambulatory Visit: Payer: Self-pay

## 2019-07-11 ENCOUNTER — Inpatient Hospital Stay: Payer: Medicare Other | Attending: Oncology

## 2019-07-11 ENCOUNTER — Other Ambulatory Visit: Payer: Medicare Other

## 2019-07-11 VITALS — BP 136/87 | HR 71 | Temp 99.0°F | Resp 16

## 2019-07-11 DIAGNOSIS — C7951 Secondary malignant neoplasm of bone: Secondary | ICD-10-CM | POA: Diagnosis not present

## 2019-07-11 DIAGNOSIS — C7A019 Malignant carcinoid tumor of the small intestine, unspecified portion: Secondary | ICD-10-CM | POA: Diagnosis not present

## 2019-07-11 DIAGNOSIS — C787 Secondary malignant neoplasm of liver and intrahepatic bile duct: Secondary | ICD-10-CM | POA: Diagnosis not present

## 2019-07-11 DIAGNOSIS — L270 Generalized skin eruption due to drugs and medicaments taken internally: Secondary | ICD-10-CM | POA: Insufficient documentation

## 2019-07-11 DIAGNOSIS — R197 Diarrhea, unspecified: Secondary | ICD-10-CM | POA: Diagnosis not present

## 2019-07-11 DIAGNOSIS — D61818 Other pancytopenia: Secondary | ICD-10-CM | POA: Diagnosis not present

## 2019-07-11 DIAGNOSIS — T375X5A Adverse effect of antiviral drugs, initial encounter: Secondary | ICD-10-CM | POA: Insufficient documentation

## 2019-07-11 DIAGNOSIS — Z79899 Other long term (current) drug therapy: Secondary | ICD-10-CM | POA: Diagnosis not present

## 2019-07-11 DIAGNOSIS — R630 Anorexia: Secondary | ICD-10-CM | POA: Diagnosis not present

## 2019-07-11 DIAGNOSIS — C7B8 Other secondary neuroendocrine tumors: Secondary | ICD-10-CM

## 2019-07-11 MED ORDER — OCTREOTIDE ACETATE 30 MG IM KIT
PACK | INTRAMUSCULAR | Status: AC
  Filled 2019-07-11: qty 1

## 2019-07-11 MED ORDER — OCTREOTIDE ACETATE 30 MG IM KIT
30.0000 mg | PACK | Freq: Once | INTRAMUSCULAR | Status: AC
  Administered 2019-07-11: 30 mg via INTRAMUSCULAR

## 2019-07-11 NOTE — Patient Instructions (Signed)
Octreotide injection solution °What is this medicine? °OCTREOTIDE (ok TREE oh tide) is used to reduce blood levels of growth hormone in patients with a condition called acromegaly. This medicine also reduces flushing and watery diarrhea caused by certain types of cancer. °This medicine may be used for other purposes; ask your health care provider or pharmacist if you have questions. °COMMON BRAND NAME(S): Bynfezia, Sandostatin, Sandostatin LAR °What should I tell my health care provider before I take this medicine? °They need to know if you have any of these conditions: °· diabetes °· gallbladder disease °· kidney disease °· liver disease °· an unusual or allergic reaction to octreotide, other medicines, foods, dyes, or preservatives °· pregnant or trying to get pregnant °· breast-feeding °How should I use this medicine? °This medicine is for injection under the skin or into a vein (only in emergency situations). It is usually given by a health care professional in a hospital or clinic setting. °If you get this medicine at home, you will be taught how to prepare and give this medicine. Allow the injection solution to come to room temperature before use. Do not warm it artificially. Use exactly as directed. Take your medicine at regular intervals. Do not take your medicine more often than directed. °It is important that you put your used needles and syringes in a special sharps container. Do not put them in a trash can. If you do not have a sharps container, call your pharmacist or healthcare provider to get one. °Talk to your pediatrician regarding the use of this medicine in children. Special care may be needed. °Overdosage: If you think you have taken too much of this medicine contact a poison control center or emergency room at once. °NOTE: This medicine is only for you. Do not share this medicine with others. °What if I miss a dose? °If you miss a dose, take it as soon as you can. If it is almost time for your  next dose, take only that dose. Do not take double or extra doses. °What may interact with this medicine? °Do not take this medicine with any of the following medications: °· cisapride °· droperidol °· general anesthetics °· grepafloxacin °· perphenazine °· thioridazine °This medicine may also interact with the following medications: °· bromocriptine °· cyclosporine °· diuretics °· medicines for blood pressure, heart disease, irregular heart beat °· medicines for diabetes, including insulin °· quinidine °This list may not describe all possible interactions. Give your health care provider a list of all the medicines, herbs, non-prescription drugs, or dietary supplements you use. Also tell them if you smoke, drink alcohol, or use illegal drugs. Some items may interact with your medicine. °What should I watch for while using this medicine? °Visit your doctor or health care professional for regular checks on your progress. °To help reduce irritation at the injection site, use a different site for each injection and make sure the solution is at room temperature before use. °This medicine may cause decreases in blood sugar. Signs of low blood sugar include chills, cool, pale skin or cold sweats, drowsiness, extreme hunger, fast heartbeat, headache, nausea, nervousness or anxiety, shakiness, trembling, unsteadiness, tiredness, or weakness. Contact your doctor or health care professional right away if you experience any of these symptoms. °This medicine may increase blood sugar. Ask your healthcare provider if changes in diet or medicines are needed if you have diabetes. °This medicine may cause a decrease in vitamin B12. You should make sure that you get enough vitamin B12   while you are taking this medicine. Discuss the foods you eat and the vitamins you take with your health care professional. °What side effects may I notice from receiving this medicine? °Side effects that you should report to your doctor or health care  professional as soon as possible: °· allergic reactions like skin rash, itching or hives, swelling of the face, lips, or tongue °· decreases in blood sugar °· changes in heart rate °· severe stomach pain °·  °signs and symptoms of high blood sugar such as being more thirsty or hungry or having to urinate more than normal. You may also feel very tired or have blurry vision. °Side effects that usually do not require medical attention (report to your doctor or health care professional if they continue or are bothersome): °· diarrhea or constipation °· gas or stomach pain °· nausea, vomiting °· pain, redness, swelling and irritation at site where injected °This list may not describe all possible side effects. Call your doctor for medical advice about side effects. You may report side effects to FDA at 1-800-FDA-1088. °Where should I keep my medicine? °Keep out of the reach of children. °Store in a refrigerator between 2 and 8 degrees C (36 and 46 degrees F). Protect from light. Allow to come to room temperature naturally. Do not use artificial heat. If protected from light, the injection may be stored at room temperature between 20 and 30 degrees C (70 and 86 degrees F) for 14 days. After the initial use, throw away any unused portion of a multiple dose vial after 14 days. Throw away unused portions of the ampules after use. °NOTE: This sheet is a summary. It may not cover all possible information. If you have questions about this medicine, talk to your doctor, pharmacist, or health care provider. °© 2020 [employer] (2018-06-17 08:07:09) ° °

## 2019-08-02 IMAGING — US IR US GUIDANCE
1 series · 10 of 10 positions shown · non-contrast
Comparison: CT abdomen and pelvis - 10/13/2017

INDICATION: No known primary, now with multiple liver lesions worrisome for
either metastatic disease versus multifocal hepatocellular
carcinoma. Please perform ultrasound-guided biopsy for tissue
diagnostic purposes.

EXAM:
ULTRASOUND GUIDED LIVER LESION BIOPSY

[Series 1: ir us guidance · 10 of 10 slices shown]
[im 1/10]
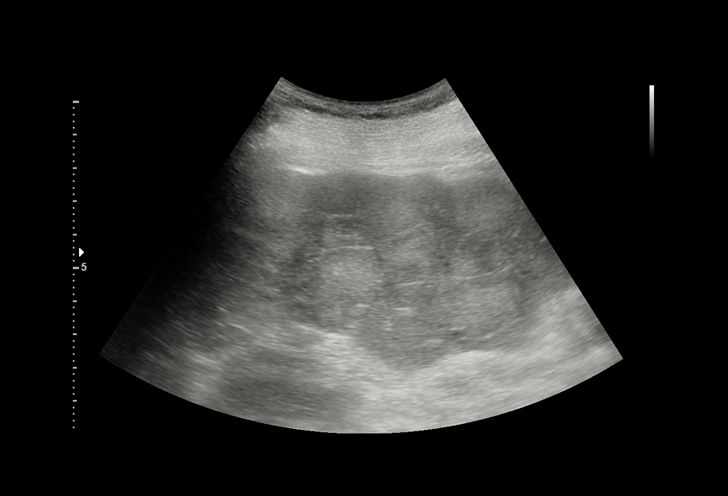
[im 2/10]
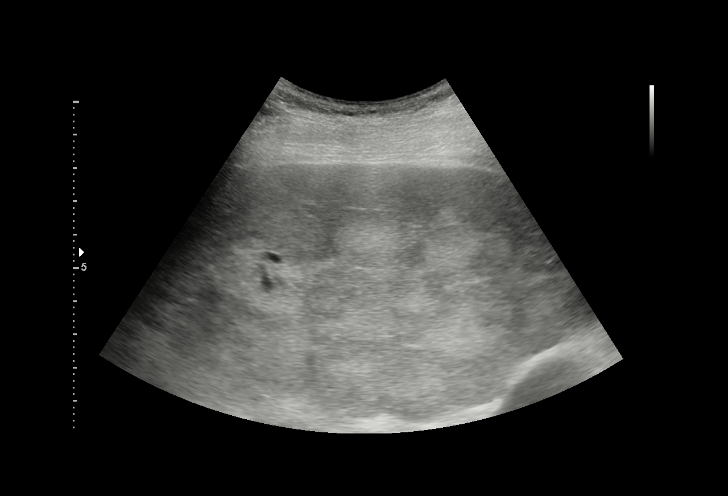
[im 3/10]
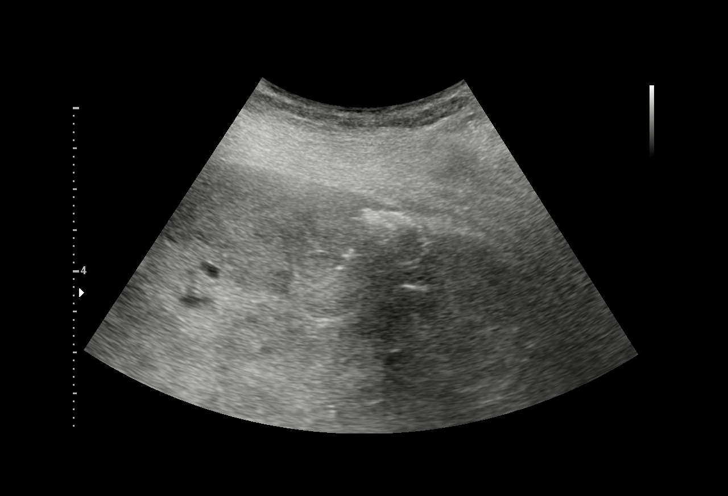
[im 4/10]
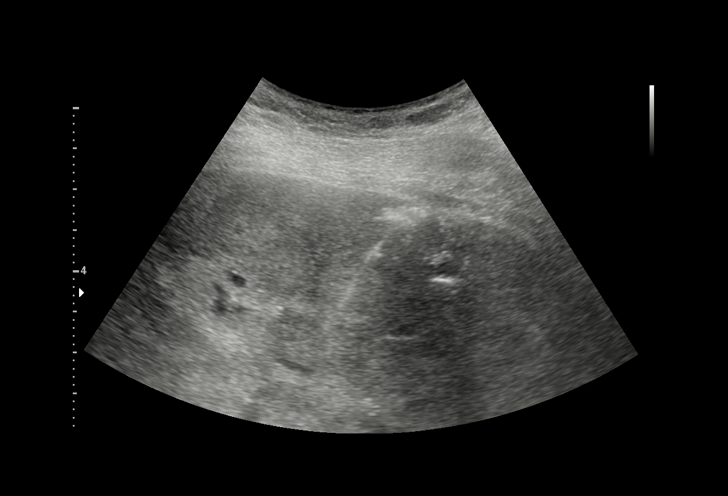
[im 5/10]
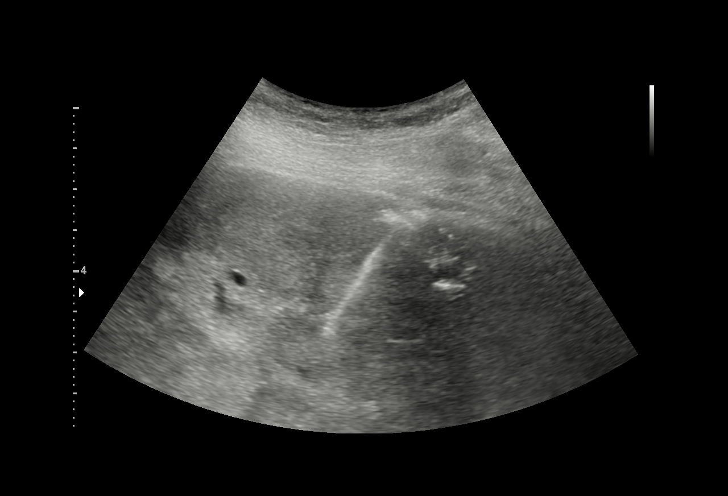
[im 6/10]
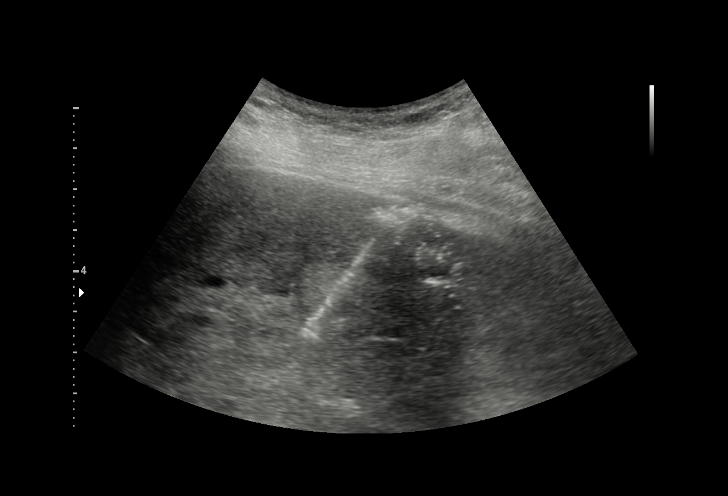
[im 7/10]
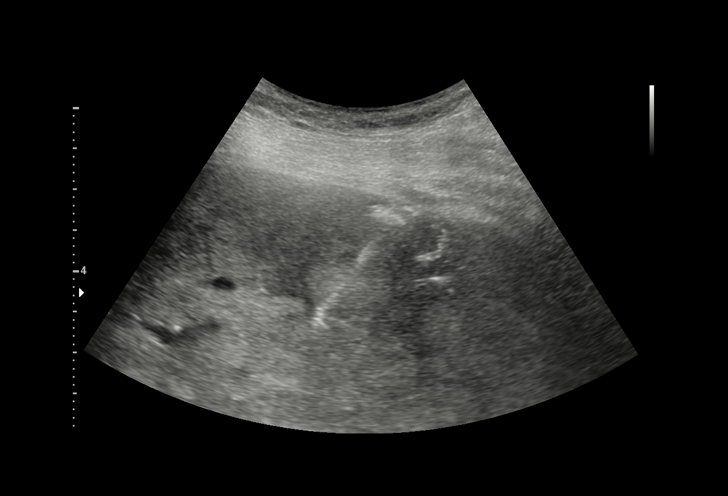
[im 8/10]
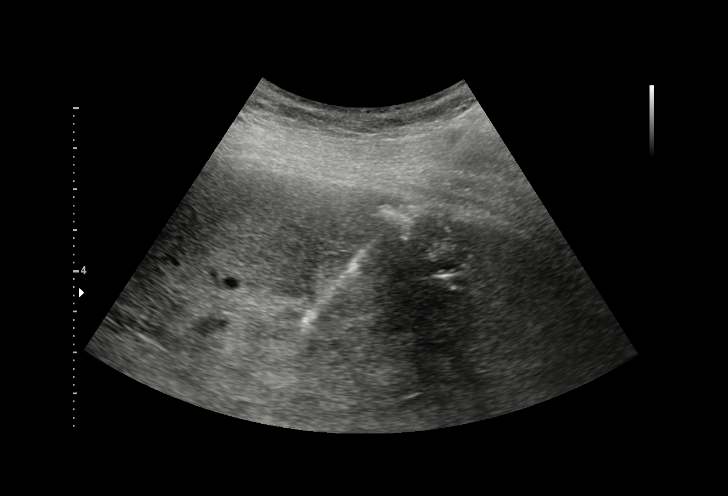
[im 9/10]
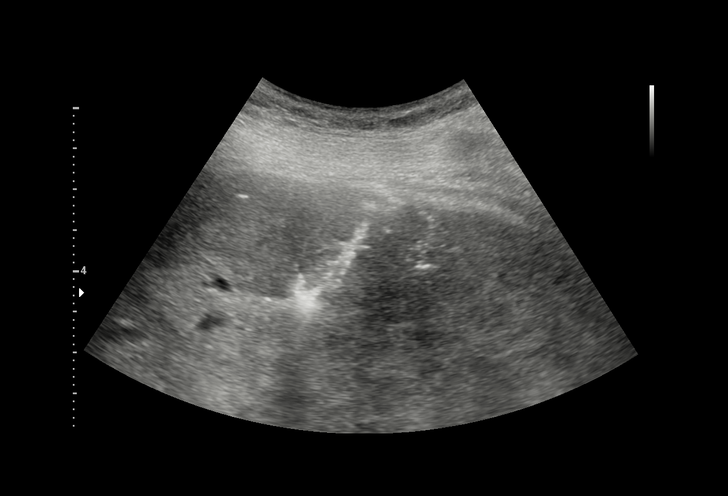
[im 10/10]
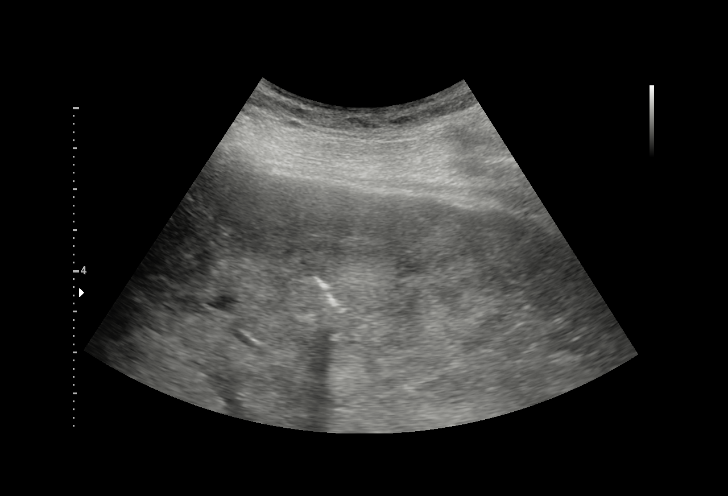

[10 of 10 positions shown; findings below may reference images not displayed]

MEDICATIONS:
None

ANESTHESIA/SEDATION:
Fentanyl 75 mcg IV; Versed 1.5 mg IV

Total Moderate Sedation time:  12 Minutes.

The patient's level of consciousness and vital signs were monitored
continuously by radiology nursing throughout the procedure under my
direct supervision.

COMPLICATIONS:
None immediate.

PROCEDURE:
Informed written consent was obtained from the patient after a
discussion of the risks, benefits and alternatives to treatment. The
patient understands and consents the procedure. A timeout was
performed prior to the initiation of the procedure.

Ultrasound scanning was performed of the right upper abdominal
quadrant demonstrates multiple mixed attenuating liver lesions and
masses compatible with the findings seen on preceding abdominal CT.
An approximately 2.0 x 1.7 cm mixed attenuating solid nodule within
the subcapsular caudal aspect the right lobe of the liver was
targeted for biopsy given lesion location and sonographic window
(image 3). The procedure was planned.

The right upper abdominal quadrant was prepped and draped in the
usual sterile fashion. The overlying soft tissues were anesthetized
with 1% lidocaine with epinephrine. A 17 gauge, 6.8 cm co-axial
needle was advanced into a peripheral aspect of the lesion. This was
followed by 5 core biopsies with an 18 gauge core device under
direct ultrasound guidance.

The coaxial needle tract was embolized with a small amount of
Gel-Foam slurry and superficial hemostasis was obtained with manual
compression. Post procedural scanning was negative for definitive
area of hemorrhage or additional complication. A dressing was
placed. The patient tolerated the procedure well without immediate
post procedural complication.
IMPRESSION: Technically successful ultrasound guided core needle biopsy of
indeterminate approximately 2.0 cm nodule within the subcapsular
caudal aspect the right lobe of the liver.

## 2019-08-08 ENCOUNTER — Encounter: Payer: Self-pay | Admitting: Nurse Practitioner

## 2019-08-08 ENCOUNTER — Inpatient Hospital Stay: Payer: Medicare Other

## 2019-08-08 ENCOUNTER — Other Ambulatory Visit: Payer: Self-pay

## 2019-08-08 ENCOUNTER — Inpatient Hospital Stay: Payer: Medicare Other | Attending: Oncology | Admitting: Nurse Practitioner

## 2019-08-08 VITALS — BP 137/82 | HR 82 | Temp 97.9°F | Resp 18 | Wt 224.4 lb

## 2019-08-08 DIAGNOSIS — N400 Enlarged prostate without lower urinary tract symptoms: Secondary | ICD-10-CM | POA: Insufficient documentation

## 2019-08-08 DIAGNOSIS — Z79899 Other long term (current) drug therapy: Secondary | ICD-10-CM | POA: Insufficient documentation

## 2019-08-08 DIAGNOSIS — C7B8 Other secondary neuroendocrine tumors: Secondary | ICD-10-CM

## 2019-08-08 DIAGNOSIS — E340 Carcinoid syndrome: Secondary | ICD-10-CM | POA: Insufficient documentation

## 2019-08-08 DIAGNOSIS — C787 Secondary malignant neoplasm of liver and intrahepatic bile duct: Secondary | ICD-10-CM | POA: Insufficient documentation

## 2019-08-08 DIAGNOSIS — R630 Anorexia: Secondary | ICD-10-CM | POA: Insufficient documentation

## 2019-08-08 DIAGNOSIS — C7A019 Malignant carcinoid tumor of the small intestine, unspecified portion: Secondary | ICD-10-CM | POA: Insufficient documentation

## 2019-08-08 DIAGNOSIS — K7460 Unspecified cirrhosis of liver: Secondary | ICD-10-CM | POA: Insufficient documentation

## 2019-08-08 DIAGNOSIS — L810 Postinflammatory hyperpigmentation: Secondary | ICD-10-CM | POA: Diagnosis not present

## 2019-08-08 DIAGNOSIS — D61818 Other pancytopenia: Secondary | ICD-10-CM | POA: Insufficient documentation

## 2019-08-08 DIAGNOSIS — R911 Solitary pulmonary nodule: Secondary | ICD-10-CM | POA: Insufficient documentation

## 2019-08-08 MED ORDER — OCTREOTIDE ACETATE 30 MG IM KIT
30.0000 mg | PACK | Freq: Once | INTRAMUSCULAR | Status: AC
  Administered 2019-08-08: 12:00:00 30 mg via INTRAMUSCULAR

## 2019-08-08 NOTE — Patient Instructions (Signed)
Octreotide injection solution °What is this medicine? °OCTREOTIDE (ok TREE oh tide) is used to reduce blood levels of growth hormone in patients with a condition called acromegaly. This medicine also reduces flushing and watery diarrhea caused by certain types of cancer. °This medicine may be used for other purposes; ask your health care provider or pharmacist if you have questions. °COMMON BRAND NAME(S): Bynfezia, Sandostatin, Sandostatin LAR °What should I tell my health care provider before I take this medicine? °They need to know if you have any of these conditions: °· diabetes °· gallbladder disease °· kidney disease °· liver disease °· an unusual or allergic reaction to octreotide, other medicines, foods, dyes, or preservatives °· pregnant or trying to get pregnant °· breast-feeding °How should I use this medicine? °This medicine is for injection under the skin or into a vein (only in emergency situations). It is usually given by a health care professional in a hospital or clinic setting. °If you get this medicine at home, you will be taught how to prepare and give this medicine. Allow the injection solution to come to room temperature before use. Do not warm it artificially. Use exactly as directed. Take your medicine at regular intervals. Do not take your medicine more often than directed. °It is important that you put your used needles and syringes in a special sharps container. Do not put them in a trash can. If you do not have a sharps container, call your pharmacist or healthcare provider to get one. °Talk to your pediatrician regarding the use of this medicine in children. Special care may be needed. °Overdosage: If you think you have taken too much of this medicine contact a poison control center or emergency room at once. °NOTE: This medicine is only for you. Do not share this medicine with others. °What if I miss a dose? °If you miss a dose, take it as soon as you can. If it is almost time for your  next dose, take only that dose. Do not take double or extra doses. °What may interact with this medicine? °Do not take this medicine with any of the following medications: °· cisapride °· droperidol °· general anesthetics °· grepafloxacin °· perphenazine °· thioridazine °This medicine may also interact with the following medications: °· bromocriptine °· cyclosporine °· diuretics °· medicines for blood pressure, heart disease, irregular heart beat °· medicines for diabetes, including insulin °· quinidine °This list may not describe all possible interactions. Give your health care provider a list of all the medicines, herbs, non-prescription drugs, or dietary supplements you use. Also tell them if you smoke, drink alcohol, or use illegal drugs. Some items may interact with your medicine. °What should I watch for while using this medicine? °Visit your doctor or health care professional for regular checks on your progress. °To help reduce irritation at the injection site, use a different site for each injection and make sure the solution is at room temperature before use. °This medicine may cause decreases in blood sugar. Signs of low blood sugar include chills, cool, pale skin or cold sweats, drowsiness, extreme hunger, fast heartbeat, headache, nausea, nervousness or anxiety, shakiness, trembling, unsteadiness, tiredness, or weakness. Contact your doctor or health care professional right away if you experience any of these symptoms. °This medicine may increase blood sugar. Ask your healthcare provider if changes in diet or medicines are needed if you have diabetes. °This medicine may cause a decrease in vitamin B12. You should make sure that you get enough vitamin B12   while you are taking this medicine. Discuss the foods you eat and the vitamins you take with your health care professional. °What side effects may I notice from receiving this medicine? °Side effects that you should report to your doctor or health care  professional as soon as possible: °· allergic reactions like skin rash, itching or hives, swelling of the face, lips, or tongue °· decreases in blood sugar °· changes in heart rate °· severe stomach pain °·  °signs and symptoms of high blood sugar such as being more thirsty or hungry or having to urinate more than normal. You may also feel very tired or have blurry vision. °Side effects that usually do not require medical attention (report to your doctor or health care professional if they continue or are bothersome): °· diarrhea or constipation °· gas or stomach pain °· nausea, vomiting °· pain, redness, swelling and irritation at site where injected °This list may not describe all possible side effects. Call your doctor for medical advice about side effects. You may report side effects to FDA at 1-800-FDA-1088. °Where should I keep my medicine? °Keep out of the reach of children. °Store in a refrigerator between 2 and 8 degrees C (36 and 46 degrees F). Protect from light. Allow to come to room temperature naturally. Do not use artificial heat. If protected from light, the injection may be stored at room temperature between 20 and 30 degrees C (70 and 86 degrees F) for 14 days. After the initial use, throw away any unused portion of a multiple dose vial after 14 days. Throw away unused portions of the ampules after use. °NOTE: This sheet is a summary. It may not cover all possible information. If you have questions about this medicine, talk to your doctor, pharmacist, or health care provider. °© 2020 [employer] (2018-06-17 08:07:09) ° °

## 2019-08-08 NOTE — Progress Notes (Signed)
[place] OFFICE PROGRESS NOTE   Diagnosis: Carcinoid tumor  INTERVAL HISTORY:   Charles Schmidt returns as scheduled.  He continues monthly Sandostatin.  Overall he feels well.  No diarrhea.  No abdominal pain.  No flushing episodes.  Objective:  Vital signs in last 24 hours:  Blood pressure 137/82, pulse 82, temperature 97.9 F (36.6 C), temperature source Temporal, resp. rate 18, weight 224 lb 6.4 oz (101.8 kg), SpO2 99 %.    HEENT: Neck without mass. Lymphatics: No palpable cervical, supraclavicular or axillary lymph nodes. GI: Liver palpable throughout the upper abdomen, nontender. Vascular: No leg edema. Skin: Skin hyperpigmentation at site of previous zoster rash left chest.   Lab Results:  Lab Results  Component Value Date   WBC 3.6 (L) 04/12/2019   HGB 12.1 (L) 04/12/2019   HCT 36.4 (L) 04/12/2019   MCV 77.0 (L) 04/12/2019   PLT 223 04/12/2019   NEUTROABS 1.9 04/12/2019    Imaging:  No results found.  Medications: I have reviewed the patient's current medications.  Assessment/Plan: 1.Metastatic carcinoid tumor, biopsy of a liver lesion 10/23/2017 consistent with a well-differentiated neuroendocrine neoplasm, WHO grade 2,ki-6710%  CT abdomen/pelvis 10/13/2017-extensive liver metastases, cirrhosis, soft tissue mass in the small bowel mesentery versus a primary small bowel tumor, tiny lucent lesions in the pelvic bones  Elevated chromogranin A and 24-hour urine 5-HIAA  CT chest 11/24/2017-subpleural nodule in the left lower lobe with associated radiotracer activity on comparison DOTATATEPET scan. No additional evidence of thoracic metastasis.  DOTATATE PET scan3/01/2018-intense radiotracer accumulation with innumerable confluent hepatic metastasis; intense radiotracer activity within central mesenteric mass; 2 foci of uptake associated with the small bowel; intense radiotracer activity associated periaortic and paraspinal lymph nodes; more  distant solitary small metastasis within the pleural space left lower lobe.  Monthly Sandostatin initiated 11/26/2017, dose increased to 30 mg 01/21/2018  Cycle 1 Lutathera 03/03/2018, monthly Sandostatin continued  Cycle 2 Lutathera 04/27/2018, monthly Sandostatin continued  Cycle 3 Lutathera 06/23/2018  Cycle 4 Lutathera 08/18/2018  Restaging dotatate PET scan 09/29/2018- multiple lesions again demonstrated within the liver which accumulate the radiotracer.  The number of lesions which accumulate the tracer has decreased visually compared to the prior exam.  Some lesions are no longer evident along the right hepatic margin.  Remaining measurable lesions have radiotracer activity similar to prior.  No new lesions present.  Central mesenteric mass unchanged and remains avid for radiotracer.  Adjacent lesion within the small bowel SUV max equal 10.5 compared to SUV max equal 15.8.  Smaller lesion previously identified in the upper pelvis small bowel appears decreased in activity.  No new metastatic lesions.  Lesion position between the right psoas muscle and spine SUV max equal to 21.8 compared with SUV max equal 26.2.  2.Diarrhea-likely secondary to carcinoid syndrome, improved with Sandostatin 3.Anorexia/weight loss-improved 4.Prostatic hypertrophy;status post laser vaporization 01/05/2018 5.  Mild pancytopenia following Lutathera treatment 6.  Thoracic dermatomal zoster rash 04/02/2019 treated with Valtrex   Disposition: Charles Schmidt appears stable.  There is no clinical evidence of disease progression.  We will follow-up on the chromogranin A from today.  Plan to continue monthly Sandostatin.  Plan for restaging Netspot in approximately 2 months.  He will return for lab and follow-up in 2 months, 2 to 3 days after the Netspot.  Plan reviewed with Dr. Benay Spice.  Charles Schmidt ANP/GNP-BC   08/08/2019  10:37 AM

## 2019-08-09 ENCOUNTER — Telehealth: Payer: Self-pay | Admitting: Oncology

## 2019-08-09 NOTE — Telephone Encounter (Signed)
I talk with patient regarding schedule  

## 2019-08-10 LAB — CHROMOGRANIN A: Chromogranin A (ng/mL): 6204 ng/mL — ABNORMAL HIGH (ref 0.0–101.8)

## 2019-08-26 DIAGNOSIS — I10 Essential (primary) hypertension: Secondary | ICD-10-CM | POA: Diagnosis not present

## 2019-08-26 DIAGNOSIS — E340 Carcinoid syndrome: Secondary | ICD-10-CM | POA: Diagnosis not present

## 2019-08-26 DIAGNOSIS — R7303 Prediabetes: Secondary | ICD-10-CM | POA: Diagnosis not present

## 2019-09-05 ENCOUNTER — Other Ambulatory Visit: Payer: Self-pay

## 2019-09-05 ENCOUNTER — Inpatient Hospital Stay: Payer: Medicare Other | Attending: Oncology

## 2019-09-05 VITALS — BP 133/78 | HR 79 | Temp 98.3°F | Resp 20

## 2019-09-05 DIAGNOSIS — R197 Diarrhea, unspecified: Secondary | ICD-10-CM | POA: Insufficient documentation

## 2019-09-05 DIAGNOSIS — N400 Enlarged prostate without lower urinary tract symptoms: Secondary | ICD-10-CM | POA: Insufficient documentation

## 2019-09-05 DIAGNOSIS — C787 Secondary malignant neoplasm of liver and intrahepatic bile duct: Secondary | ICD-10-CM | POA: Diagnosis present

## 2019-09-05 DIAGNOSIS — D61818 Other pancytopenia: Secondary | ICD-10-CM | POA: Diagnosis not present

## 2019-09-05 DIAGNOSIS — C7A019 Malignant carcinoid tumor of the small intestine, unspecified portion: Secondary | ICD-10-CM | POA: Diagnosis not present

## 2019-09-05 DIAGNOSIS — Z79899 Other long term (current) drug therapy: Secondary | ICD-10-CM | POA: Insufficient documentation

## 2019-09-05 DIAGNOSIS — C7B8 Other secondary neuroendocrine tumors: Secondary | ICD-10-CM

## 2019-09-05 MED ORDER — OCTREOTIDE ACETATE 30 MG IM KIT
PACK | INTRAMUSCULAR | Status: AC
  Filled 2019-09-05: qty 1

## 2019-09-05 MED ORDER — OCTREOTIDE ACETATE 30 MG IM KIT
30.0000 mg | PACK | Freq: Once | INTRAMUSCULAR | Status: AC
  Administered 2019-09-05: 12:00:00 30 mg via INTRAMUSCULAR

## 2019-09-05 NOTE — Patient Instructions (Signed)
Octreotide injection solution °What is this medicine? °OCTREOTIDE (ok TREE oh tide) is used to reduce blood levels of growth hormone in patients with a condition called acromegaly. This medicine also reduces flushing and watery diarrhea caused by certain types of cancer. °This medicine may be used for other purposes; ask your health care provider or pharmacist if you have questions. °COMMON BRAND NAME(S): Bynfezia, Sandostatin, Sandostatin LAR °What should I tell my health care provider before I take this medicine? °They need to know if you have any of these conditions: °· diabetes °· gallbladder disease °· kidney disease °· liver disease °· an unusual or allergic reaction to octreotide, other medicines, foods, dyes, or preservatives °· pregnant or trying to get pregnant °· breast-feeding °How should I use this medicine? °This medicine is for injection under the skin or into a vein (only in emergency situations). It is usually given by a health care professional in a hospital or clinic setting. °If you get this medicine at home, you will be taught how to prepare and give this medicine. Allow the injection solution to come to room temperature before use. Do not warm it artificially. Use exactly as directed. Take your medicine at regular intervals. Do not take your medicine more often than directed. °It is important that you put your used needles and syringes in a special sharps container. Do not put them in a trash can. If you do not have a sharps container, call your pharmacist or healthcare provider to get one. °Talk to your pediatrician regarding the use of this medicine in children. Special care may be needed. °Overdosage: If you think you have taken too much of this medicine contact a poison control center or emergency room at once. °NOTE: This medicine is only for you. Do not share this medicine with others. °What if I miss a dose? °If you miss a dose, take it as soon as you can. If it is almost time for your  next dose, take only that dose. Do not take double or extra doses. °What may interact with this medicine? °Do not take this medicine with any of the following medications: °· cisapride °· droperidol °· general anesthetics °· grepafloxacin °· perphenazine °· thioridazine °This medicine may also interact with the following medications: °· bromocriptine °· cyclosporine °· diuretics °· medicines for blood pressure, heart disease, irregular heart beat °· medicines for diabetes, including insulin °· quinidine °This list may not describe all possible interactions. Give your health care provider a list of all the medicines, herbs, non-prescription drugs, or dietary supplements you use. Also tell them if you smoke, drink alcohol, or use illegal drugs. Some items may interact with your medicine. °What should I watch for while using this medicine? °Visit your doctor or health care professional for regular checks on your progress. °To help reduce irritation at the injection site, use a different site for each injection and make sure the solution is at room temperature before use. °This medicine may cause decreases in blood sugar. Signs of low blood sugar include chills, cool, pale skin or cold sweats, drowsiness, extreme hunger, fast heartbeat, headache, nausea, nervousness or anxiety, shakiness, trembling, unsteadiness, tiredness, or weakness. Contact your doctor or health care professional right away if you experience any of these symptoms. °This medicine may increase blood sugar. Ask your healthcare provider if changes in diet or medicines are needed if you have diabetes. °This medicine may cause a decrease in vitamin B12. You should make sure that you get enough vitamin B12   while you are taking this medicine. Discuss the foods you eat and the vitamins you take with your health care professional. °What side effects may I notice from receiving this medicine? °Side effects that you should report to your doctor or health care  professional as soon as possible: °· allergic reactions like skin rash, itching or hives, swelling of the face, lips, or tongue °· decreases in blood sugar °· changes in heart rate °· severe stomach pain °·  °signs and symptoms of high blood sugar such as being more thirsty or hungry or having to urinate more than normal. You may also feel very tired or have blurry vision. °Side effects that usually do not require medical attention (report to your doctor or health care professional if they continue or are bothersome): °· diarrhea or constipation °· gas or stomach pain °· nausea, vomiting °· pain, redness, swelling and irritation at site where injected °This list may not describe all possible side effects. Call your doctor for medical advice about side effects. You may report side effects to FDA at 1-800-FDA-1088. °Where should I keep my medicine? °Keep out of the reach of children. °Store in a refrigerator between 2 and 8 degrees C (36 and 46 degrees F). Protect from light. Allow to come to room temperature naturally. Do not use artificial heat. If protected from light, the injection may be stored at room temperature between 20 and 30 degrees C (70 and 86 degrees F) for 14 days. After the initial use, throw away any unused portion of a multiple dose vial after 14 days. Throw away unused portions of the ampules after use. °NOTE: This sheet is a summary. It may not cover all possible information. If you have questions about this medicine, talk to your doctor, pharmacist, or health care provider. °© 2020 [employer] (2018-06-17 08:07:09) ° °

## 2019-10-10 ENCOUNTER — Telehealth: Payer: Self-pay | Admitting: *Deleted

## 2019-10-10 ENCOUNTER — Ambulatory Visit (HOSPITAL_COMMUNITY): Payer: Medicare Other

## 2019-10-10 NOTE — Telephone Encounter (Signed)
Patient left VM that his DOTATE scan was cancelled today. Asking if it is necessary and when it will be rescheduled. Daughter called afterwards asking for his recent lab results and how to sign up for Mychart?  Called daughter and provided reason for scan cancellation--due to scheduled by central scheduling at inappropriate time (can only be done at 7am-11am-or 3pm). The nuclear med scheduler will call patient with new date/time. Informed daughter that he already has Mychart--may have shut him out due to how long it has been since he last signed on. Instructed her to go to [employer] website to re-activate his account. Patient notified as well.

## 2019-10-11 ENCOUNTER — Telehealth: Payer: Self-pay | Admitting: *Deleted

## 2019-10-11 NOTE — Telephone Encounter (Signed)
Per Lovena Le in nuclear med, provided patient with appointment on 10/19/2019 at 0700 for Dotatate scan--arrival at 0630 and NPO after midnight. He agrees to appointment. OK to hold SandoLAR till his 10/31/19 visit per Dr. Benay Spice.

## 2019-10-19 ENCOUNTER — Ambulatory Visit (HOSPITAL_COMMUNITY)
Admission: RE | Admit: 2019-10-19 | Discharge: 2019-10-19 | Disposition: A | Discharge status: Home / Self Care | Payer: Medicare Other | Source: Ambulatory Visit | Attending: Nurse Practitioner | Admitting: Nurse Practitioner

## 2019-10-19 ENCOUNTER — Other Ambulatory Visit: Payer: Self-pay

## 2019-10-19 DIAGNOSIS — C787 Secondary malignant neoplasm of liver and intrahepatic bile duct: Secondary | ICD-10-CM | POA: Diagnosis not present

## 2019-10-19 DIAGNOSIS — C7A1 Malignant poorly differentiated neuroendocrine tumors: Secondary | ICD-10-CM | POA: Diagnosis not present

## 2019-10-19 DIAGNOSIS — C7B8 Other secondary neuroendocrine tumors: Secondary | ICD-10-CM | POA: Diagnosis not present

## 2019-10-19 MED ORDER — GALLIUM GA 68 DOTATATE IV KIT
5.6000 | PACK | Freq: Once | INTRAVENOUS | Status: AC | PRN
  Administered 2019-10-19: 5.6 via INTRAVENOUS

## 2019-10-24 ENCOUNTER — Telehealth: Payer: Self-pay | Admitting: *Deleted

## 2019-10-24 NOTE — Telephone Encounter (Signed)
Per Dr. Benay Spice, called to make pt aware that Netspot scan confirms stable carcinoma disease involving the liver and small bowel mass. Advised to f/u as scheduled. Pt verbalized understanding.

## 2019-10-24 NOTE — Telephone Encounter (Signed)
-----   Message from Ladell Pier, MD sent at 10/21/2019  4:42 PM EST ----- Please call patient, Charles Schmidt confirms stable carcinoma disease involving the liver, and small bowel mass, follow-up as scheduled

## 2019-10-28 ENCOUNTER — Other Ambulatory Visit: Payer: Self-pay | Admitting: *Deleted

## 2019-10-28 DIAGNOSIS — C7B8 Other secondary neuroendocrine tumors: Secondary | ICD-10-CM

## 2019-10-31 ENCOUNTER — Inpatient Hospital Stay: Payer: Medicare Other | Attending: Oncology

## 2019-10-31 ENCOUNTER — Other Ambulatory Visit: Payer: Self-pay

## 2019-10-31 ENCOUNTER — Inpatient Hospital Stay (HOSPITAL_BASED_OUTPATIENT_CLINIC_OR_DEPARTMENT_OTHER): Payer: Medicare Other | Admitting: Oncology

## 2019-10-31 ENCOUNTER — Telehealth: Payer: Self-pay | Admitting: Oncology

## 2019-10-31 ENCOUNTER — Inpatient Hospital Stay: Payer: Medicare Other

## 2019-10-31 VITALS — BP 130/82 | HR 93 | Temp 98.9°F | Resp 17 | Ht 76.0 in | Wt 222.6 lb

## 2019-10-31 DIAGNOSIS — Z79899 Other long term (current) drug therapy: Secondary | ICD-10-CM | POA: Insufficient documentation

## 2019-10-31 DIAGNOSIS — C7A019 Malignant carcinoid tumor of the small intestine, unspecified portion: Secondary | ICD-10-CM | POA: Diagnosis not present

## 2019-10-31 DIAGNOSIS — E340 Carcinoid syndrome: Secondary | ICD-10-CM | POA: Insufficient documentation

## 2019-10-31 DIAGNOSIS — N400 Enlarged prostate without lower urinary tract symptoms: Secondary | ICD-10-CM | POA: Diagnosis not present

## 2019-10-31 DIAGNOSIS — C7B8 Other secondary neuroendocrine tumors: Secondary | ICD-10-CM

## 2019-10-31 DIAGNOSIS — R911 Solitary pulmonary nodule: Secondary | ICD-10-CM | POA: Diagnosis not present

## 2019-10-31 DIAGNOSIS — D61818 Other pancytopenia: Secondary | ICD-10-CM | POA: Diagnosis not present

## 2019-10-31 DIAGNOSIS — R21 Rash and other nonspecific skin eruption: Secondary | ICD-10-CM | POA: Diagnosis not present

## 2019-10-31 DIAGNOSIS — C787 Secondary malignant neoplasm of liver and intrahepatic bile duct: Secondary | ICD-10-CM | POA: Diagnosis not present

## 2019-10-31 DIAGNOSIS — R5381 Other malaise: Secondary | ICD-10-CM | POA: Insufficient documentation

## 2019-10-31 MED ORDER — OCTREOTIDE ACETATE 20 MG IM KIT
PACK | INTRAMUSCULAR | Status: AC
  Filled 2019-10-31: qty 1

## 2019-10-31 MED ORDER — OCTREOTIDE ACETATE 30 MG IM KIT
30.0000 mg | PACK | Freq: Once | INTRAMUSCULAR | Status: AC
  Administered 2019-10-31: 30 mg via INTRAMUSCULAR

## 2019-10-31 NOTE — Progress Notes (Signed)
[place] OFFICE PROGRESS NOTE   Diagnosis: Carcinoid tumor  INTERVAL HISTORY:   Charles Schmidt returns as scheduled.  He continues monthly Sandostatin.  Good appetite.  He reports malaise.  He has frequent bowel movements that he does not take Imodium, but he does not have frank diarrhea.  Objective:  Vital signs in last 24 hours:  Blood pressure 130/82, pulse 93, temperature 98.9 F (37.2 C), temperature source Temporal, resp. rate 17, height 6' 4"  (1.93 m), weight 222 lb 9.6 oz (101 kg), SpO2 100 %.   Lymphatics: No cervical, supraclavicular, axillary, or inguinal nodes Resp: Lungs clear bilaterally Cardio: Regular rate and rhythm GI: The liver is palpable throughout the upper abdomen, no splenomegaly, nontender Vascular: No leg edema   Lab Results:  Lab Results  Component Value Date   WBC 3.6 (L) 04/12/2019   HGB 12.1 (L) 04/12/2019   HCT 36.4 (L) 04/12/2019   MCV 77.0 (L) 04/12/2019   PLT 223 04/12/2019   NEUTROABS 1.9 04/12/2019    CMP  Lab Results  Component Value Date   NA 142 04/12/2019   K 3.3 (L) 04/12/2019   CL 104 04/12/2019   CO2 28 04/12/2019   GLUCOSE 105 (H) 04/12/2019   BUN 15 04/12/2019   CREATININE 1.05 04/12/2019   CALCIUM 9.3 04/12/2019   PROT 7.8 04/12/2019   ALBUMIN 3.5 04/12/2019   AST 27 04/12/2019   ALT 19 04/12/2019   ALKPHOS 134 (H) 04/12/2019   BILITOT 0.4 04/12/2019   GFRNONAA >60 04/12/2019   GFRAA >60 04/12/2019    Medications: I have reviewed the patient's current medications.   Assessment/Plan: 1.Metastatic carcinoid tumor, biopsy of a liver lesion 10/23/2017 consistent with a well-differentiated neuroendocrine neoplasm, WHO grade 2,ki-6710%  CT abdomen/pelvis 10/13/2017-extensive liver metastases, cirrhosis, soft tissue mass in the small bowel mesentery versus a primary small bowel tumor, tiny lucent lesions in the pelvic bones  Elevated chromogranin A and 24-hour urine 5-HIAA  CT chest  11/24/2017-subpleural nodule in the left lower lobe with associated radiotracer activity on comparison DOTATATEPET scan. No additional evidence of thoracic metastasis.  DOTATATE PET scan3/01/2018-intense radiotracer accumulation with innumerable confluent hepatic metastasis; intense radiotracer activity within central mesenteric mass; 2 foci of uptake associated with the small bowel; intense radiotracer activity associated periaortic and paraspinal lymph nodes; more distant solitary small metastasis within the pleural space left lower lobe.  Monthly Sandostatin initiated 11/26/2017, dose increased to 30 mg 01/21/2018  Cycle 1 Lutathera 03/03/2018, monthly Sandostatin continued  Cycle 2 Lutathera 04/27/2018, monthly Sandostatin continued  Cycle 3 Lutathera 06/23/2018  Cycle 4 Lutathera 08/18/2018  Restaging dotatate PET scan 09/29/2018- multiple lesions again demonstrated within the liver which accumulate the radiotracer.  The number of lesions which accumulate the tracer has decreased visually compared to the prior exam.  Some lesions are no longer evident along the right hepatic margin.  Remaining measurable lesions have radiotracer activity similar to prior.  No new lesions present.  Central mesenteric mass unchanged and remains avid for radiotracer.  Adjacent lesion within the small bowel SUV max equal 10.5 compared to SUV max equal 15.8.  Smaller lesion previously identified in the upper pelvis small bowel appears decreased in activity.  No new metastatic lesions.  Lesion position between the right psoas muscle and spine SUV max equal to 21.8 compared with SUV max equal 26.2.  Dotatate PET 10/19/2019-no evidence of disease progression, stable multifocal hepatic metastases, unchanged from post therapy scan 03/30/2019 and improved from pretherapy scan 01/24/2018.  Stable mesenteric mass, improved retroperitoneal lymph node adjacent to the celiac trunk, solitary small bowel lesion unchanged  Monthly Sandostatin  continued  2.Diarrhea-likely secondary to carcinoid syndrome, improved with Sandostatin 3.History of anorexia/weight loss-improved 4.Prostatic hypertrophy;status post laser vaporization 01/05/2018 5.  Mild pancytopenia following Lutathera treatment 6.  Thoracic dermatomal zoster rash 04/02/2019 treated with Valtrex     Disposition: Charles Schmidt appears stable.  The restaging dotatate scan reveals overall stable disease.  No evidence of disease progression.  He will continue monthly Sandostatin.  I reviewed the PET images with him.  We will consider repeat treatment with Lutathera if he develops clinical or significant radiologic evidence of disease progression.  We will check a chromogranin level when he returns for Sandostatin next month.  Charles Schmidt will be scheduled for an office visit in 4 months.  He has chronic Red cell microcytosis, likely related to a thalassemia variant.  We will check a CBC and ferritin level when he returns next month.  Betsy Coder, MD  10/31/2019  10:58 AM

## 2019-10-31 NOTE — Telephone Encounter (Signed)
Scheduled per los. Called and spoke with patient. Confirmed appts  

## 2019-11-25 DIAGNOSIS — R7303 Prediabetes: Secondary | ICD-10-CM | POA: Diagnosis not present

## 2019-11-25 DIAGNOSIS — E340 Carcinoid syndrome: Secondary | ICD-10-CM | POA: Diagnosis not present

## 2019-11-25 DIAGNOSIS — I10 Essential (primary) hypertension: Secondary | ICD-10-CM | POA: Diagnosis not present

## 2019-11-25 DIAGNOSIS — B029 Zoster without complications: Secondary | ICD-10-CM | POA: Diagnosis not present

## 2019-11-28 ENCOUNTER — Other Ambulatory Visit: Payer: Self-pay

## 2019-11-28 ENCOUNTER — Inpatient Hospital Stay: Payer: Medicare Other | Attending: Oncology

## 2019-11-28 ENCOUNTER — Inpatient Hospital Stay: Payer: Medicare Other

## 2019-11-28 VITALS — BP 128/75 | HR 78 | Temp 98.2°F | Resp 18

## 2019-11-28 DIAGNOSIS — C787 Secondary malignant neoplasm of liver and intrahepatic bile duct: Secondary | ICD-10-CM | POA: Insufficient documentation

## 2019-11-28 DIAGNOSIS — C7A019 Malignant carcinoid tumor of the small intestine, unspecified portion: Secondary | ICD-10-CM | POA: Insufficient documentation

## 2019-11-28 DIAGNOSIS — C7B8 Other secondary neuroendocrine tumors: Secondary | ICD-10-CM

## 2019-11-28 LAB — CBC WITH DIFFERENTIAL (CANCER CENTER ONLY)
Abs Immature Granulocytes: 0 10*3/uL (ref 0.00–0.07)
Basophils Absolute: 0 10*3/uL (ref 0.0–0.1)
Basophils Relative: 0 %
Eosinophils Absolute: 0.1 10*3/uL (ref 0.0–0.5)
Eosinophils Relative: 3 %
HCT: 36.7 % — ABNORMAL LOW (ref 39.0–52.0)
Hemoglobin: 12.1 g/dL — ABNORMAL LOW (ref 13.0–17.0)
Immature Granulocytes: 0 %
Lymphocytes Relative: 35 %
Lymphs Abs: 1.2 10*3/uL (ref 0.7–4.0)
MCH: 25.5 pg — ABNORMAL LOW (ref 26.0–34.0)
MCHC: 33 g/dL (ref 30.0–36.0)
MCV: 77.4 fL — ABNORMAL LOW (ref 80.0–100.0)
Monocytes Absolute: 0.4 10*3/uL (ref 0.1–1.0)
Monocytes Relative: 11 %
Neutro Abs: 1.8 10*3/uL (ref 1.7–7.7)
Neutrophils Relative %: 51 %
Platelet Count: 148 10*3/uL — ABNORMAL LOW (ref 150–400)
RBC: 4.74 MIL/uL (ref 4.22–5.81)
RDW: 17.5 % — ABNORMAL HIGH (ref 11.5–15.5)
WBC Count: 3.4 10*3/uL — ABNORMAL LOW (ref 4.0–10.5)
nRBC: 0 % (ref 0.0–0.2)

## 2019-11-28 LAB — FERRITIN: Ferritin: 239 ng/mL (ref 24–336)

## 2019-11-28 MED ORDER — OCTREOTIDE ACETATE 30 MG IM KIT
PACK | INTRAMUSCULAR | Status: AC
  Filled 2019-11-28: qty 1

## 2019-11-28 MED ORDER — OCTREOTIDE ACETATE 30 MG IM KIT
30.0000 mg | PACK | Freq: Once | INTRAMUSCULAR | Status: AC
  Administered 2019-11-28: 30 mg via INTRAMUSCULAR

## 2019-11-28 NOTE — Patient Instructions (Signed)
Octreotide injection solution °What is this medicine? °OCTREOTIDE (ok TREE oh tide) is used to reduce blood levels of growth hormone in patients with a condition called acromegaly. This medicine also reduces flushing and watery diarrhea caused by certain types of cancer. °This medicine may be used for other purposes; ask your health care provider or pharmacist if you have questions. °COMMON BRAND NAME(S): Bynfezia, Sandostatin, Sandostatin LAR °What should I tell my health care provider before I take this medicine? °They need to know if you have any of these conditions: °· diabetes °· gallbladder disease °· kidney disease °· liver disease °· an unusual or allergic reaction to octreotide, other medicines, foods, dyes, or preservatives °· pregnant or trying to get pregnant °· breast-feeding °How should I use this medicine? °This medicine is for injection under the skin or into a vein (only in emergency situations). It is usually given by a health care professional in a hospital or clinic setting. °If you get this medicine at home, you will be taught how to prepare and give this medicine. Allow the injection solution to come to room temperature before use. Do not warm it artificially. Use exactly as directed. Take your medicine at regular intervals. Do not take your medicine more often than directed. °It is important that you put your used needles and syringes in a special sharps container. Do not put them in a trash can. If you do not have a sharps container, call your pharmacist or healthcare provider to get one. °Talk to your pediatrician regarding the use of this medicine in children. Special care may be needed. °Overdosage: If you think you have taken too much of this medicine contact a poison control center or emergency room at once. °NOTE: This medicine is only for you. Do not share this medicine with others. °What if I miss a dose? °If you miss a dose, take it as soon as you can. If it is almost time for your  next dose, take only that dose. Do not take double or extra doses. °What may interact with this medicine? °Do not take this medicine with any of the following medications: °· cisapride °· droperidol °· general anesthetics °· grepafloxacin °· perphenazine °· thioridazine °This medicine may also interact with the following medications: °· bromocriptine °· cyclosporine °· diuretics °· medicines for blood pressure, heart disease, irregular heart beat °· medicines for diabetes, including insulin °· quinidine °This list may not describe all possible interactions. Give your health care provider a list of all the medicines, herbs, non-prescription drugs, or dietary supplements you use. Also tell them if you smoke, drink alcohol, or use illegal drugs. Some items may interact with your medicine. °What should I watch for while using this medicine? °Visit your doctor or health care professional for regular checks on your progress. °To help reduce irritation at the injection site, use a different site for each injection and make sure the solution is at room temperature before use. °This medicine may cause decreases in blood sugar. Signs of low blood sugar include chills, cool, pale skin or cold sweats, drowsiness, extreme hunger, fast heartbeat, headache, nausea, nervousness or anxiety, shakiness, trembling, unsteadiness, tiredness, or weakness. Contact your doctor or health care professional right away if you experience any of these symptoms. °This medicine may increase blood sugar. Ask your healthcare provider if changes in diet or medicines are needed if you have diabetes. °This medicine may cause a decrease in vitamin B12. You should make sure that you get enough vitamin B12   while you are taking this medicine. Discuss the foods you eat and the vitamins you take with your health care professional. °What side effects may I notice from receiving this medicine? °Side effects that you should report to your doctor or health care  professional as soon as possible: °· allergic reactions like skin rash, itching or hives, swelling of the face, lips, or tongue °· decreases in blood sugar °· changes in heart rate °· severe stomach pain °·  °signs and symptoms of high blood sugar such as being more thirsty or hungry or having to urinate more than normal. You may also feel very tired or have blurry vision. °Side effects that usually do not require medical attention (report to your doctor or health care professional if they continue or are bothersome): °· diarrhea or constipation °· gas or stomach pain °· nausea, vomiting °· pain, redness, swelling and irritation at site where injected °This list may not describe all possible side effects. Call your doctor for medical advice about side effects. You may report side effects to FDA at 1-800-FDA-1088. °Where should I keep my medicine? °Keep out of the reach of children. °Store in a refrigerator between 2 and 8 degrees C (36 and 46 degrees F). Protect from light. Allow to come to room temperature naturally. Do not use artificial heat. If protected from light, the injection may be stored at room temperature between 20 and 30 degrees C (70 and 86 degrees F) for 14 days. After the initial use, throw away any unused portion of a multiple dose vial after 14 days. Throw away unused portions of the ampules after use. °NOTE: This sheet is a summary. It may not cover all possible information. If you have questions about this medicine, talk to your doctor, pharmacist, or health care provider. °© 2020 [employer] (2018-06-17 08:07:09) ° °

## 2019-11-29 LAB — CHROMOGRANIN A: Chromogranin A (ng/mL): 8253 ng/mL — ABNORMAL HIGH (ref 0.0–101.8)

## 2019-12-15 DIAGNOSIS — H401122 Primary open-angle glaucoma, left eye, moderate stage: Secondary | ICD-10-CM | POA: Diagnosis not present

## 2019-12-15 DIAGNOSIS — H2513 Age-related nuclear cataract, bilateral: Secondary | ICD-10-CM | POA: Diagnosis not present

## 2019-12-15 DIAGNOSIS — H401113 Primary open-angle glaucoma, right eye, severe stage: Secondary | ICD-10-CM | POA: Diagnosis not present

## 2019-12-21 ENCOUNTER — Other Ambulatory Visit: Payer: Self-pay | Admitting: *Deleted

## 2019-12-21 DIAGNOSIS — C7B8 Other secondary neuroendocrine tumors: Secondary | ICD-10-CM

## 2019-12-21 NOTE — Progress Notes (Signed)
MD requested to add chromogranin A to injection appointment on 01/26/20. Scheduling message sent.

## 2019-12-29 ENCOUNTER — Other Ambulatory Visit: Payer: Self-pay

## 2019-12-29 ENCOUNTER — Inpatient Hospital Stay: Payer: Medicare Other | Attending: Oncology

## 2019-12-29 VITALS — BP 140/89 | HR 79 | Temp 98.7°F | Resp 20

## 2019-12-29 DIAGNOSIS — Z79899 Other long term (current) drug therapy: Secondary | ICD-10-CM | POA: Insufficient documentation

## 2019-12-29 DIAGNOSIS — D61818 Other pancytopenia: Secondary | ICD-10-CM | POA: Insufficient documentation

## 2019-12-29 DIAGNOSIS — R197 Diarrhea, unspecified: Secondary | ICD-10-CM | POA: Diagnosis not present

## 2019-12-29 DIAGNOSIS — C7B8 Other secondary neuroendocrine tumors: Secondary | ICD-10-CM

## 2019-12-29 DIAGNOSIS — C7A019 Malignant carcinoid tumor of the small intestine, unspecified portion: Secondary | ICD-10-CM | POA: Insufficient documentation

## 2019-12-29 DIAGNOSIS — N400 Enlarged prostate without lower urinary tract symptoms: Secondary | ICD-10-CM | POA: Diagnosis not present

## 2019-12-29 DIAGNOSIS — C7B02 Secondary carcinoid tumors of liver: Secondary | ICD-10-CM | POA: Diagnosis not present

## 2019-12-29 MED ORDER — OCTREOTIDE ACETATE 30 MG IM KIT
PACK | INTRAMUSCULAR | Status: AC
  Filled 2019-12-29: qty 1

## 2019-12-29 MED ORDER — OCTREOTIDE ACETATE 30 MG IM KIT
30.0000 mg | PACK | Freq: Once | INTRAMUSCULAR | Status: AC
  Administered 2019-12-29: 30 mg via INTRAMUSCULAR

## 2019-12-29 NOTE — Patient Instructions (Signed)
Octreotide injection solution What is this medicine? OCTREOTIDE (ok TREE oh tide) is used to reduce blood levels of growth hormone in patients with a condition called acromegaly. This medicine also reduces flushing and watery diarrhea caused by certain types of cancer. This medicine may be used for other purposes; ask your health care provider or pharmacist if you have questions. COMMON BRAND NAME(S): Bynfezia, Sandostatin What should I tell my health care provider before I take this medicine? They need to know if you have any of these conditions:  diabetes  gallbladder disease  kidney disease  liver disease  thyroid disease  an unusual or allergic reaction to octreotide, other medicines, foods, dyes, or preservatives  pregnant or trying to get pregnant  breast-feeding How should I use this medicine? This medicine is for injection under the skin or into a vein (only in emergency situations). It is usually given by a health care professional in a hospital or clinic setting. If you get this medicine at home, you will be taught how to prepare and give this medicine. Allow the injection solution to come to room temperature before use. Do not warm it artificially. Use exactly as directed. Take your medicine at regular intervals. Do not take your medicine more often than directed. It is important that you put your used needles and syringes in a special sharps container. Do not put them in a trash can. If you do not have a sharps container, call your pharmacist or healthcare provider to get one. Talk to your pediatrician regarding the use of this medicine in children. Special care may be needed. Overdosage: If you think you have taken too much of this medicine contact a poison control center or emergency room at once. NOTE: This medicine is only for you. Do not share this medicine with others. What if I miss a dose? If you miss a dose, take it as soon as you can. If it is almost time for your  next dose, take only that dose. Do not take double or extra doses. What may interact with this medicine?  bromocriptine  certain medicines for blood pressure, heart disease, irregular heartbeat  cyclosporine  diuretics  medicines for diabetes, including insulin  quinidine This list may not describe all possible interactions. Give your health care provider a list of all the medicines, herbs, non-prescription drugs, or dietary supplements you use. Also tell them if you smoke, drink alcohol, or use illegal drugs. Some items may interact with your medicine. What should I watch for while using this medicine? Visit your doctor or health care professional for regular checks on your progress. To help reduce irritation at the injection site, use a different site for each injection and make sure the solution is at room temperature before use. This medicine may cause decreases in blood sugar. Signs of low blood sugar include chills, cool, pale skin or cold sweats, drowsiness, extreme hunger, fast heartbeat, headache, nausea, nervousness or anxiety, shakiness, trembling, unsteadiness, tiredness, or weakness. Contact your doctor or health care professional right away if you experience any of these symptoms. This medicine may increase blood sugar. Ask your healthcare provider if changes in diet or medicines are needed if you have diabetes. This medicine may cause a decrease in vitamin B12. You should make sure that you get enough vitamin B12 while you are taking this medicine. Discuss the foods you eat and the vitamins you take with your health care professional. What side effects may I notice from receiving this medicine? Side   effects that you should report to your doctor or health care professional as soon as possible:  allergic reactions like skin rash, itching or hives, swelling of the face, lips, or tongue  fast, slow, or irregular heartbeat  right upper belly pain  severe stomach pain  signs  and symptoms of high blood sugar such as being more thirsty or hungry or having to urinate more than normal. You may also feel very tired or have blurry vision.  signs and symptoms of low blood sugar such as feeling anxious; confusion; dizziness; increased hunger; unusually weak or tired; increased sweating; shakiness; cold, clammy skin; irritable; headache; blurred vision; fast heartbeat; loss of consciousness  unusually weak or tired Side effects that usually do not require medical attention (report to your doctor or health care professional if they continue or are bothersome):  diarrhea  dizziness  gas  headache  nausea, vomiting  pain, redness, or irritation at site where injected  upset stomach This list may not describe all possible side effects. Call your doctor for medical advice about side effects. You may report side effects to FDA at 1-800-FDA-1088. Where should I keep my medicine? Keep out of the reach of children. Store in a refrigerator between 2 and 8 degrees C (36 and 46 degrees F). Protect from light. Allow to come to room temperature naturally. Do not use artificial heat. If protected from light, the injection may be stored at room temperature between 20 and 30 degrees C (70 and 86 degrees F) for 14 days. After the initial use, throw away any unused portion of a multiple dose vial after 14 days. Throw away unused portions of the ampules after use. NOTE: This sheet is a summary. It may not cover all possible information. If you have questions about this medicine, talk to your doctor, pharmacist, or health care provider.  2020 Elsevier/Gold Standard (2019-04-07 13:33:09)  

## 2020-01-26 ENCOUNTER — Inpatient Hospital Stay: Payer: Medicare Other

## 2020-01-26 ENCOUNTER — Inpatient Hospital Stay: Payer: Medicare Other | Attending: Oncology

## 2020-01-26 ENCOUNTER — Other Ambulatory Visit: Payer: Self-pay

## 2020-01-26 VITALS — BP 138/84 | HR 76 | Temp 98.6°F | Resp 18

## 2020-01-26 DIAGNOSIS — C7B8 Other secondary neuroendocrine tumors: Secondary | ICD-10-CM

## 2020-01-26 DIAGNOSIS — D61818 Other pancytopenia: Secondary | ICD-10-CM | POA: Diagnosis not present

## 2020-01-26 DIAGNOSIS — C7A8 Other malignant neuroendocrine tumors: Secondary | ICD-10-CM | POA: Insufficient documentation

## 2020-01-26 DIAGNOSIS — R197 Diarrhea, unspecified: Secondary | ICD-10-CM | POA: Diagnosis not present

## 2020-01-26 DIAGNOSIS — C787 Secondary malignant neoplasm of liver and intrahepatic bile duct: Secondary | ICD-10-CM | POA: Diagnosis not present

## 2020-01-26 DIAGNOSIS — N400 Enlarged prostate without lower urinary tract symptoms: Secondary | ICD-10-CM | POA: Insufficient documentation

## 2020-01-26 DIAGNOSIS — Z79899 Other long term (current) drug therapy: Secondary | ICD-10-CM | POA: Diagnosis not present

## 2020-01-26 DIAGNOSIS — R21 Rash and other nonspecific skin eruption: Secondary | ICD-10-CM | POA: Insufficient documentation

## 2020-01-26 MED ORDER — OCTREOTIDE ACETATE 30 MG IM KIT
PACK | INTRAMUSCULAR | Status: AC
  Filled 2020-01-26: qty 1

## 2020-01-26 MED ORDER — OCTREOTIDE ACETATE 30 MG IM KIT
30.0000 mg | PACK | Freq: Once | INTRAMUSCULAR | Status: AC
  Administered 2020-01-26: 10:00:00 30 mg via INTRAMUSCULAR

## 2020-01-26 NOTE — Patient Instructions (Signed)
Octreotide injection solution What is this medicine? OCTREOTIDE (ok TREE oh tide) is used to reduce blood levels of growth hormone in patients with a condition called acromegaly. This medicine also reduces flushing and watery diarrhea caused by certain types of cancer. This medicine may be used for other purposes; ask your health care provider or pharmacist if you have questions. COMMON BRAND NAME(S): Bynfezia, Sandostatin What should I tell my health care provider before I take this medicine? They need to know if you have any of these conditions:  diabetes  gallbladder disease  kidney disease  liver disease  thyroid disease  an unusual or allergic reaction to octreotide, other medicines, foods, dyes, or preservatives  pregnant or trying to get pregnant  breast-feeding How should I use this medicine? This medicine is for injection under the skin or into a vein (only in emergency situations). It is usually given by a health care professional in a hospital or clinic setting. If you get this medicine at home, you will be taught how to prepare and give this medicine. Allow the injection solution to come to room temperature before use. Do not warm it artificially. Use exactly as directed. Take your medicine at regular intervals. Do not take your medicine more often than directed. It is important that you put your used needles and syringes in a special sharps container. Do not put them in a trash can. If you do not have a sharps container, call your pharmacist or healthcare provider to get one. Talk to your pediatrician regarding the use of this medicine in children. Special care may be needed. Overdosage: If you think you have taken too much of this medicine contact a poison control center or emergency room at once. NOTE: This medicine is only for you. Do not share this medicine with others. What if I miss a dose? If you miss a dose, take it as soon as you can. If it is almost time for your  next dose, take only that dose. Do not take double or extra doses. What may interact with this medicine?  bromocriptine  certain medicines for blood pressure, heart disease, irregular heartbeat  cyclosporine  diuretics  medicines for diabetes, including insulin  quinidine This list may not describe all possible interactions. Give your health care provider a list of all the medicines, herbs, non-prescription drugs, or dietary supplements you use. Also tell them if you smoke, drink alcohol, or use illegal drugs. Some items may interact with your medicine. What should I watch for while using this medicine? Visit your doctor or health care professional for regular checks on your progress. To help reduce irritation at the injection site, use a different site for each injection and make sure the solution is at room temperature before use. This medicine may cause decreases in blood sugar. Signs of low blood sugar include chills, cool, pale skin or cold sweats, drowsiness, extreme hunger, fast heartbeat, headache, nausea, nervousness or anxiety, shakiness, trembling, unsteadiness, tiredness, or weakness. Contact your doctor or health care professional right away if you experience any of these symptoms. This medicine may increase blood sugar. Ask your healthcare provider if changes in diet or medicines are needed if you have diabetes. This medicine may cause a decrease in vitamin B12. You should make sure that you get enough vitamin B12 while you are taking this medicine. Discuss the foods you eat and the vitamins you take with your health care professional. What side effects may I notice from receiving this medicine? Side   effects that you should report to your doctor or health care professional as soon as possible:  allergic reactions like skin rash, itching or hives, swelling of the face, lips, or tongue  fast, slow, or irregular heartbeat  right upper belly pain  severe stomach pain  signs  and symptoms of high blood sugar such as being more thirsty or hungry or having to urinate more than normal. You may also feel very tired or have blurry vision.  signs and symptoms of low blood sugar such as feeling anxious; confusion; dizziness; increased hunger; unusually weak or tired; increased sweating; shakiness; cold, clammy skin; irritable; headache; blurred vision; fast heartbeat; loss of consciousness  unusually weak or tired Side effects that usually do not require medical attention (report to your doctor or health care professional if they continue or are bothersome):  diarrhea  dizziness  gas  headache  nausea, vomiting  pain, redness, or irritation at site where injected  upset stomach This list may not describe all possible side effects. Call your doctor for medical advice about side effects. You may report side effects to FDA at 1-800-FDA-1088. Where should I keep my medicine? Keep out of the reach of children. Store in a refrigerator between 2 and 8 degrees C (36 and 46 degrees F). Protect from light. Allow to come to room temperature naturally. Do not use artificial heat. If protected from light, the injection may be stored at room temperature between 20 and 30 degrees C (70 and 86 degrees F) for 14 days. After the initial use, throw away any unused portion of a multiple dose vial after 14 days. Throw away unused portions of the ampules after use. NOTE: This sheet is a summary. It may not cover all possible information. If you have questions about this medicine, talk to your doctor, pharmacist, or health care provider.  2020 Elsevier/Gold Standard (2019-04-07 13:33:09)  

## 2020-01-27 LAB — CHROMOGRANIN A: Chromogranin A (ng/mL): 7141 ng/mL — ABNORMAL HIGH (ref 0.0–101.8)

## 2020-02-28 ENCOUNTER — Inpatient Hospital Stay: Payer: Medicare Other | Attending: Oncology | Admitting: Nurse Practitioner

## 2020-02-28 ENCOUNTER — Inpatient Hospital Stay: Payer: Medicare Other

## 2020-02-28 ENCOUNTER — Other Ambulatory Visit: Payer: Self-pay

## 2020-02-28 ENCOUNTER — Encounter: Payer: Self-pay | Admitting: Nurse Practitioner

## 2020-02-28 VITALS — BP 130/91 | HR 69 | Temp 98.1°F | Resp 18 | Ht 76.0 in | Wt 219.3 lb

## 2020-02-28 DIAGNOSIS — N400 Enlarged prostate without lower urinary tract symptoms: Secondary | ICD-10-CM | POA: Diagnosis not present

## 2020-02-28 DIAGNOSIS — E340 Carcinoid syndrome: Secondary | ICD-10-CM | POA: Diagnosis not present

## 2020-02-28 DIAGNOSIS — C787 Secondary malignant neoplasm of liver and intrahepatic bile duct: Secondary | ICD-10-CM | POA: Insufficient documentation

## 2020-02-28 DIAGNOSIS — Z79899 Other long term (current) drug therapy: Secondary | ICD-10-CM | POA: Diagnosis not present

## 2020-02-28 DIAGNOSIS — D61818 Other pancytopenia: Secondary | ICD-10-CM | POA: Diagnosis not present

## 2020-02-28 DIAGNOSIS — C7B8 Other secondary neuroendocrine tumors: Secondary | ICD-10-CM

## 2020-02-28 DIAGNOSIS — R197 Diarrhea, unspecified: Secondary | ICD-10-CM | POA: Insufficient documentation

## 2020-02-28 DIAGNOSIS — C3432 Malignant neoplasm of lower lobe, left bronchus or lung: Secondary | ICD-10-CM | POA: Diagnosis present

## 2020-02-28 DIAGNOSIS — K7460 Unspecified cirrhosis of liver: Secondary | ICD-10-CM | POA: Diagnosis not present

## 2020-02-28 MED ORDER — OCTREOTIDE ACETATE 30 MG IM KIT
PACK | INTRAMUSCULAR | Status: AC
  Filled 2020-02-28: qty 1

## 2020-02-28 MED ORDER — OCTREOTIDE ACETATE 30 MG IM KIT
30.0000 mg | PACK | Freq: Once | INTRAMUSCULAR | Status: AC
  Administered 2020-02-28: 30 mg via INTRAMUSCULAR

## 2020-02-28 NOTE — Patient Instructions (Signed)
Octreotide injection solution What is this medicine? OCTREOTIDE (ok TREE oh tide) is used to reduce blood levels of growth hormone in patients with a condition called acromegaly. This medicine also reduces flushing and watery diarrhea caused by certain types of cancer. This medicine may be used for other purposes; ask your health care provider or pharmacist if you have questions. COMMON BRAND NAME(S): Bynfezia, Sandostatin What should I tell my health care provider before I take this medicine? They need to know if you have any of these conditions:  diabetes  gallbladder disease  kidney disease  liver disease  thyroid disease  an unusual or allergic reaction to octreotide, other medicines, foods, dyes, or preservatives  pregnant or trying to get pregnant  breast-feeding How should I use this medicine? This medicine is for injection under the skin or into a vein (only in emergency situations). It is usually given by a health care professional in a hospital or clinic setting. If you get this medicine at home, you will be taught how to prepare and give this medicine. Allow the injection solution to come to room temperature before use. Do not warm it artificially. Use exactly as directed. Take your medicine at regular intervals. Do not take your medicine more often than directed. It is important that you put your used needles and syringes in a special sharps container. Do not put them in a trash can. If you do not have a sharps container, call your pharmacist or healthcare provider to get one. Talk to your pediatrician regarding the use of this medicine in children. Special care may be needed. Overdosage: If you think you have taken too much of this medicine contact a poison control center or emergency room at once. NOTE: This medicine is only for you. Do not share this medicine with others. What if I miss a dose? If you miss a dose, take it as soon as you can. If it is almost time for your  next dose, take only that dose. Do not take double or extra doses. What may interact with this medicine?  bromocriptine  certain medicines for blood pressure, heart disease, irregular heartbeat  cyclosporine  diuretics  medicines for diabetes, including insulin  quinidine This list may not describe all possible interactions. Give your health care provider a list of all the medicines, herbs, non-prescription drugs, or dietary supplements you use. Also tell them if you smoke, drink alcohol, or use illegal drugs. Some items may interact with your medicine. What should I watch for while using this medicine? Visit your doctor or health care professional for regular checks on your progress. To help reduce irritation at the injection site, use a different site for each injection and make sure the solution is at room temperature before use. This medicine may cause decreases in blood sugar. Signs of low blood sugar include chills, cool, pale skin or cold sweats, drowsiness, extreme hunger, fast heartbeat, headache, nausea, nervousness or anxiety, shakiness, trembling, unsteadiness, tiredness, or weakness. Contact your doctor or health care professional right away if you experience any of these symptoms. This medicine may increase blood sugar. Ask your healthcare provider if changes in diet or medicines are needed if you have diabetes. This medicine may cause a decrease in vitamin B12. You should make sure that you get enough vitamin B12 while you are taking this medicine. Discuss the foods you eat and the vitamins you take with your health care professional. What side effects may I notice from receiving this medicine? Side   effects that you should report to your doctor or health care professional as soon as possible:  allergic reactions like skin rash, itching or hives, swelling of the face, lips, or tongue  fast, slow, or irregular heartbeat  right upper belly pain  severe stomach pain  signs  and symptoms of high blood sugar such as being more thirsty or hungry or having to urinate more than normal. You may also feel very tired or have blurry vision.  signs and symptoms of low blood sugar such as feeling anxious; confusion; dizziness; increased hunger; unusually weak or tired; increased sweating; shakiness; cold, clammy skin; irritable; headache; blurred vision; fast heartbeat; loss of consciousness  unusually weak or tired Side effects that usually do not require medical attention (report to your doctor or health care professional if they continue or are bothersome):  diarrhea  dizziness  gas  headache  nausea, vomiting  pain, redness, or irritation at site where injected  upset stomach This list may not describe all possible side effects. Call your doctor for medical advice about side effects. You may report side effects to FDA at 1-800-FDA-1088. Where should I keep my medicine? Keep out of the reach of children. Store in a refrigerator between 2 and 8 degrees C (36 and 46 degrees F). Protect from light. Allow to come to room temperature naturally. Do not use artificial heat. If protected from light, the injection may be stored at room temperature between 20 and 30 degrees C (70 and 86 degrees F) for 14 days. After the initial use, throw away any unused portion of a multiple dose vial after 14 days. Throw away unused portions of the ampules after use. NOTE: This sheet is a summary. It may not cover all possible information. If you have questions about this medicine, talk to your doctor, pharmacist, or health care provider.  2020 Elsevier/Gold Standard (2019-04-07 13:33:09)  

## 2020-02-28 NOTE — Progress Notes (Signed)
Charles Schmidt OFFICE PROGRESS NOTE   Diagnosis: Carcinoid tumor  INTERVAL HISTORY:   Charles Schmidt returns as scheduled.  He continues monthly Sandostatin.  He has diarrhea if he "eats too much".  No flushing or sweating episodes.  He denies pain.  Objective:  Vital signs in last 24 hours:  Blood pressure (!) 130/91, pulse 69, temperature 98.1 F (36.7 C), temperature source Temporal, resp. rate 18, height 6' 4"  (1.93 m), weight 219 lb 4.8 oz (99.5 kg), SpO2 98 %.    HEENT: Multiple missing teeth. Resp: Lungs clear bilaterally. Cardio: Regular rate and rhythm. GI: Liver palpable throughout the upper abdomen. Vascular: No leg edema.   Lab Results:  Lab Results  Component Value Date   WBC 3.4 (L) 11/28/2019   HGB 12.1 (L) 11/28/2019   HCT 36.7 (L) 11/28/2019   MCV 77.4 (L) 11/28/2019   PLT 148 (L) 11/28/2019   NEUTROABS 1.8 11/28/2019    Imaging:  No results found.  Medications: I have reviewed the patient's current medications.  Assessment/Plan: 1.Metastatic carcinoid tumor, biopsy of a liver lesion 10/23/2017 consistent with a well-differentiated neuroendocrine neoplasm, WHO grade 2,ki-6710%  CT abdomen/pelvis 10/13/2017-extensive liver metastases, cirrhosis, soft tissue mass in the small bowel mesentery versus a primary small bowel tumor, tiny lucent lesions in the pelvic bones  Elevated chromogranin A and 24-hour urine 5-HIAA  CT chest 11/24/2017-subpleural nodule in the left lower lobe with associated radiotracer activity on comparison DOTATATEPET scan. No additional evidence of thoracic metastasis.  DOTATATE PET scan3/01/2018-intense radiotracer accumulation with innumerable confluent hepatic metastasis; intense radiotracer activity within central mesenteric mass; 2 foci of uptake associated with the small bowel; intense radiotracer activity associated periaortic and paraspinal lymph nodes; more distant solitary small metastasis within the pleural  space left lower lobe.  Monthly Sandostatin initiated 11/26/2017, dose increased to 30 mg 01/21/2018  Cycle 1 Lutathera 03/03/2018, monthly Sandostatin continued  Cycle 2 Lutathera 04/27/2018, monthly Sandostatin continued  Cycle 3 Lutathera 06/23/2018  Cycle 4 Lutathera 08/18/2018  Restaging dotatate PET scan 09/29/2018-multiple lesions again demonstrated within the liver which accumulate the radiotracer. The number of lesions which accumulate the tracer has decreased visually compared to the prior exam. Some lesions are no longer evident along the right hepatic margin. Remaining measurable lesions have radiotracer activity similar to prior. No new lesions present. Central mesenteric mass unchanged and remains avid for radiotracer. Adjacent lesion within the small bowel SUV max equal 10.5 compared to SUV max equal 15.8. Smaller lesion previously identified in the upper pelvis small bowel appears decreased in activity. No new metastatic lesions. Lesion position between the right psoas muscle and spine SUV max equal to 21.8 compared with SUV max equal 26.2.  Dotatate PET 10/19/2019-no evidence of disease progression, stable multifocal hepatic metastases, unchanged from post therapy scan 03/30/2019 and improved from pretherapy scan 01/24/2018.  Stable mesenteric mass, improved retroperitoneal lymph node adjacent to the celiac trunk, solitary small bowel lesion unchanged  Monthly Sandostatin continued  01/26/2020 chromogranin A level stable elevation  2.Diarrhea-likely secondary to carcinoid syndrome, improved with Sandostatin 3.History of anorexia/weight loss-improved 4.Prostatic hypertrophy;status post laser vaporization 01/05/2018 5. Mild pancytopenia following Lutathera treatment 6.Thoracic dermatomal zoster rash 04/02/2019 treated with Valtrex   Disposition: Charles Schmidt appears stable.  There is no clinical evidence of disease progression.  Most recent chromogranin A level was  stable.  Plan to continue monthly Sandostatin.  He will return for lab and follow-up in 3 months.  He will contact the office in the interim  with any problems.    Charles Schmidt ANP/GNP-BC   02/28/2020  9:31 AM

## 2020-02-29 ENCOUNTER — Telehealth: Payer: Self-pay | Admitting: Oncology

## 2020-02-29 NOTE — Telephone Encounter (Signed)
Scheduled per 6/8 los. Pt aware of appts. Noted to give pt updated calendar.

## 2020-03-27 ENCOUNTER — Other Ambulatory Visit: Payer: Self-pay

## 2020-03-27 ENCOUNTER — Inpatient Hospital Stay: Payer: Medicare Other

## 2020-03-27 ENCOUNTER — Inpatient Hospital Stay: Payer: Medicare Other | Attending: Oncology

## 2020-03-27 VITALS — BP 150/84 | Temp 98.2°F | Resp 18

## 2020-03-27 DIAGNOSIS — D61818 Other pancytopenia: Secondary | ICD-10-CM | POA: Diagnosis not present

## 2020-03-27 DIAGNOSIS — C7B02 Secondary carcinoid tumors of liver: Secondary | ICD-10-CM | POA: Diagnosis not present

## 2020-03-27 DIAGNOSIS — C7A8 Other malignant neuroendocrine tumors: Secondary | ICD-10-CM | POA: Diagnosis not present

## 2020-03-27 DIAGNOSIS — N400 Enlarged prostate without lower urinary tract symptoms: Secondary | ICD-10-CM | POA: Insufficient documentation

## 2020-03-27 DIAGNOSIS — C7B8 Other secondary neuroendocrine tumors: Secondary | ICD-10-CM

## 2020-03-27 DIAGNOSIS — Z79899 Other long term (current) drug therapy: Secondary | ICD-10-CM | POA: Insufficient documentation

## 2020-03-27 DIAGNOSIS — R197 Diarrhea, unspecified: Secondary | ICD-10-CM | POA: Diagnosis not present

## 2020-03-27 LAB — CMP (CANCER CENTER ONLY)
ALT: 28 U/L (ref 0–44)
AST: 29 U/L (ref 15–41)
Albumin: 2.9 g/dL — ABNORMAL LOW (ref 3.5–5.0)
Alkaline Phosphatase: 171 U/L — ABNORMAL HIGH (ref 38–126)
Anion gap: 11 (ref 5–15)
BUN: 12 mg/dL (ref 8–23)
CO2: 25 mmol/L (ref 22–32)
Calcium: 9.1 mg/dL (ref 8.9–10.3)
Chloride: 102 mmol/L (ref 98–111)
Creatinine: 1.02 mg/dL (ref 0.61–1.24)
GFR, Est AFR Am: >60 mL/min (ref >60)
GFR, Estimated: >60 mL/min (ref >60)
Glucose, Bld: 127 mg/dL — ABNORMAL HIGH (ref 70–99)
Potassium: 3.5 mmol/L (ref 3.5–5.1)
Sodium: 138 mmol/L (ref 135–145)
Total Bilirubin: 0.8 mg/dL (ref 0.3–1.2)
Total Protein: 7.5 g/dL (ref 6.5–8.1)

## 2020-03-27 LAB — CBC WITH DIFFERENTIAL (CANCER CENTER ONLY)
Abs Immature Granulocytes: 0.01 10*3/uL (ref 0.00–0.07)
Basophils Absolute: 0 10*3/uL (ref 0.0–0.1)
Basophils Relative: 0 %
Eosinophils Absolute: 0.1 10*3/uL (ref 0.0–0.5)
Eosinophils Relative: 1 %
HCT: 36.9 % — ABNORMAL LOW (ref 39.0–52.0)
Hemoglobin: 12.4 g/dL — ABNORMAL LOW (ref 13.0–17.0)
Immature Granulocytes: 0 %
Lymphocytes Relative: 19 %
Lymphs Abs: 0.9 10*3/uL (ref 0.7–4.0)
MCH: 25.7 pg — ABNORMAL LOW (ref 26.0–34.0)
MCHC: 33.6 g/dL (ref 30.0–36.0)
MCV: 76.6 fL — ABNORMAL LOW (ref 80.0–100.0)
Monocytes Absolute: 0.6 10*3/uL (ref 0.1–1.0)
Monocytes Relative: 12 %
Neutro Abs: 3.2 10*3/uL (ref 1.7–7.7)
Neutrophils Relative %: 68 %
Platelet Count: 183 10*3/uL (ref 150–400)
RBC: 4.82 MIL/uL (ref 4.22–5.81)
RDW: 17.1 % — ABNORMAL HIGH (ref 11.5–15.5)
WBC Count: 4.7 10*3/uL (ref 4.0–10.5)
nRBC: 0 % (ref 0.0–0.2)

## 2020-03-27 MED ORDER — OCTREOTIDE ACETATE 30 MG IM KIT
PACK | INTRAMUSCULAR | Status: AC
  Filled 2020-03-27: qty 1

## 2020-03-27 MED ORDER — OCTREOTIDE ACETATE 30 MG IM KIT
30.0000 mg | PACK | Freq: Once | INTRAMUSCULAR | Status: AC
  Administered 2020-03-27: 30 mg via INTRAMUSCULAR

## 2020-03-27 NOTE — Patient Instructions (Signed)
Octreotide injection solution What is this medicine? OCTREOTIDE (ok TREE oh tide) is used to reduce blood levels of growth hormone in patients with a condition called acromegaly. This medicine also reduces flushing and watery diarrhea caused by certain types of cancer. This medicine may be used for other purposes; ask your health care provider or pharmacist if you have questions. COMMON BRAND NAME(S): Bynfezia, Sandostatin What should I tell my health care provider before I take this medicine? They need to know if you have any of these conditions:  diabetes  gallbladder disease  kidney disease  liver disease  thyroid disease  an unusual or allergic reaction to octreotide, other medicines, foods, dyes, or preservatives  pregnant or trying to get pregnant  breast-feeding How should I use this medicine? This medicine is for injection under the skin or into a vein (only in emergency situations). It is usually given by a health care professional in a hospital or clinic setting. If you get this medicine at home, you will be taught how to prepare and give this medicine. Allow the injection solution to come to room temperature before use. Do not warm it artificially. Use exactly as directed. Take your medicine at regular intervals. Do not take your medicine more often than directed. It is important that you put your used needles and syringes in a special sharps container. Do not put them in a trash can. If you do not have a sharps container, call your pharmacist or healthcare provider to get one. Talk to your pediatrician regarding the use of this medicine in children. Special care may be needed. Overdosage: If you think you have taken too much of this medicine contact a poison control center or emergency room at once. NOTE: This medicine is only for you. Do not share this medicine with others. What if I miss a dose? If you miss a dose, take it as soon as you can. If it is almost time for your  next dose, take only that dose. Do not take double or extra doses. What may interact with this medicine?  bromocriptine  certain medicines for blood pressure, heart disease, irregular heartbeat  cyclosporine  diuretics  medicines for diabetes, including insulin  quinidine This list may not describe all possible interactions. Give your health care provider a list of all the medicines, herbs, non-prescription drugs, or dietary supplements you use. Also tell them if you smoke, drink alcohol, or use illegal drugs. Some items may interact with your medicine. What should I watch for while using this medicine? Visit your doctor or health care professional for regular checks on your progress. To help reduce irritation at the injection site, use a different site for each injection and make sure the solution is at room temperature before use. This medicine may cause decreases in blood sugar. Signs of low blood sugar include chills, cool, pale skin or cold sweats, drowsiness, extreme hunger, fast heartbeat, headache, nausea, nervousness or anxiety, shakiness, trembling, unsteadiness, tiredness, or weakness. Contact your doctor or health care professional right away if you experience any of these symptoms. This medicine may increase blood sugar. Ask your healthcare provider if changes in diet or medicines are needed if you have diabetes. This medicine may cause a decrease in vitamin B12. You should make sure that you get enough vitamin B12 while you are taking this medicine. Discuss the foods you eat and the vitamins you take with your health care professional. What side effects may I notice from receiving this medicine? Side   effects that you should report to your doctor or health care professional as soon as possible:  allergic reactions like skin rash, itching or hives, swelling of the face, lips, or tongue  fast, slow, or irregular heartbeat  right upper belly pain  severe stomach pain  signs  and symptoms of high blood sugar such as being more thirsty or hungry or having to urinate more than normal. You may also feel very tired or have blurry vision.  signs and symptoms of low blood sugar such as feeling anxious; confusion; dizziness; increased hunger; unusually weak or tired; increased sweating; shakiness; cold, clammy skin; irritable; headache; blurred vision; fast heartbeat; loss of consciousness  unusually weak or tired Side effects that usually do not require medical attention (report to your doctor or health care professional if they continue or are bothersome):  diarrhea  dizziness  gas  headache  nausea, vomiting  pain, redness, or irritation at site where injected  upset stomach This list may not describe all possible side effects. Call your doctor for medical advice about side effects. You may report side effects to FDA at 1-800-FDA-1088. Where should I keep my medicine? Keep out of the reach of children. Store in a refrigerator between 2 and 8 degrees C (36 and 46 degrees F). Protect from light. Allow to come to room temperature naturally. Do not use artificial heat. If protected from light, the injection may be stored at room temperature between 20 and 30 degrees C (70 and 86 degrees F) for 14 days. After the initial use, throw away any unused portion of a multiple dose vial after 14 days. Throw away unused portions of the ampules after use. NOTE: This sheet is a summary. It may not cover all possible information. If you have questions about this medicine, talk to your doctor, pharmacist, or health care provider.  2020 Elsevier/Gold Standard (2019-04-07 13:33:09)  

## 2020-03-29 LAB — CHROMOGRANIN A: Chromogranin A (ng/mL): 6133 ng/mL — ABNORMAL HIGH (ref 0.0–101.8)

## 2020-04-24 ENCOUNTER — Inpatient Hospital Stay: Payer: Medicare Other | Attending: Oncology

## 2020-04-24 ENCOUNTER — Other Ambulatory Visit: Payer: Self-pay

## 2020-04-24 VITALS — BP 140/91 | HR 83 | Temp 98.2°F | Resp 18

## 2020-04-24 DIAGNOSIS — E340 Carcinoid syndrome: Secondary | ICD-10-CM | POA: Diagnosis not present

## 2020-04-24 DIAGNOSIS — C787 Secondary malignant neoplasm of liver and intrahepatic bile duct: Secondary | ICD-10-CM | POA: Insufficient documentation

## 2020-04-24 DIAGNOSIS — Z79899 Other long term (current) drug therapy: Secondary | ICD-10-CM | POA: Insufficient documentation

## 2020-04-24 DIAGNOSIS — K7460 Unspecified cirrhosis of liver: Secondary | ICD-10-CM | POA: Diagnosis not present

## 2020-04-24 DIAGNOSIS — N400 Enlarged prostate without lower urinary tract symptoms: Secondary | ICD-10-CM | POA: Diagnosis not present

## 2020-04-24 DIAGNOSIS — C7A8 Other malignant neuroendocrine tumors: Secondary | ICD-10-CM | POA: Insufficient documentation

## 2020-04-24 DIAGNOSIS — D61818 Other pancytopenia: Secondary | ICD-10-CM | POA: Insufficient documentation

## 2020-04-24 DIAGNOSIS — R21 Rash and other nonspecific skin eruption: Secondary | ICD-10-CM | POA: Diagnosis not present

## 2020-04-24 DIAGNOSIS — R197 Diarrhea, unspecified: Secondary | ICD-10-CM | POA: Diagnosis not present

## 2020-04-24 DIAGNOSIS — C7B8 Other secondary neuroendocrine tumors: Secondary | ICD-10-CM

## 2020-04-24 MED ORDER — OCTREOTIDE ACETATE 30 MG IM KIT
PACK | INTRAMUSCULAR | Status: AC
  Filled 2020-04-24: qty 1

## 2020-04-24 MED ORDER — OCTREOTIDE ACETATE 30 MG IM KIT
30.0000 mg | PACK | Freq: Once | INTRAMUSCULAR | Status: AC
  Administered 2020-04-24: 30 mg via INTRAMUSCULAR

## 2020-04-24 NOTE — Patient Instructions (Signed)
Octreotide injection solution What is this medicine? OCTREOTIDE (ok TREE oh tide) is used to reduce blood levels of growth hormone in patients with a condition called acromegaly. This medicine also reduces flushing and watery diarrhea caused by certain types of cancer. This medicine may be used for other purposes; ask your health care provider or pharmacist if you have questions. COMMON BRAND NAME(S): Bynfezia, Sandostatin What should I tell my health care provider before I take this medicine? They need to know if you have any of these conditions:  diabetes  gallbladder disease  kidney disease  liver disease  thyroid disease  an unusual or allergic reaction to octreotide, other medicines, foods, dyes, or preservatives  pregnant or trying to get pregnant  breast-feeding How should I use this medicine? This medicine is for injection under the skin or into a vein (only in emergency situations). It is usually given by a health care professional in a hospital or clinic setting. If you get this medicine at home, you will be taught how to prepare and give this medicine. Allow the injection solution to come to room temperature before use. Do not warm it artificially. Use exactly as directed. Take your medicine at regular intervals. Do not take your medicine more often than directed. It is important that you put your used needles and syringes in a special sharps container. Do not put them in a trash can. If you do not have a sharps container, call your pharmacist or healthcare provider to get one. Talk to your pediatrician regarding the use of this medicine in children. Special care may be needed. Overdosage: If you think you have taken too much of this medicine contact a poison control center or emergency room at once. NOTE: This medicine is only for you. Do not share this medicine with others. What if I miss a dose? If you miss a dose, take it as soon as you can. If it is almost time for your  next dose, take only that dose. Do not take double or extra doses. What may interact with this medicine?  bromocriptine  certain medicines for blood pressure, heart disease, irregular heartbeat  cyclosporine  diuretics  medicines for diabetes, including insulin  quinidine This list may not describe all possible interactions. Give your health care provider a list of all the medicines, herbs, non-prescription drugs, or dietary supplements you use. Also tell them if you smoke, drink alcohol, or use illegal drugs. Some items may interact with your medicine. What should I watch for while using this medicine? Visit your doctor or health care professional for regular checks on your progress. To help reduce irritation at the injection site, use a different site for each injection and make sure the solution is at room temperature before use. This medicine may cause decreases in blood sugar. Signs of low blood sugar include chills, cool, pale skin or cold sweats, drowsiness, extreme hunger, fast heartbeat, headache, nausea, nervousness or anxiety, shakiness, trembling, unsteadiness, tiredness, or weakness. Contact your doctor or health care professional right away if you experience any of these symptoms. This medicine may increase blood sugar. Ask your healthcare provider if changes in diet or medicines are needed if you have diabetes. This medicine may cause a decrease in vitamin B12. You should make sure that you get enough vitamin B12 while you are taking this medicine. Discuss the foods you eat and the vitamins you take with your health care professional. What side effects may I notice from receiving this medicine? Side   effects that you should report to your doctor or health care professional as soon as possible:  allergic reactions like skin rash, itching or hives, swelling of the face, lips, or tongue  fast, slow, or irregular heartbeat  right upper belly pain  severe stomach pain  signs  and symptoms of high blood sugar such as being more thirsty or hungry or having to urinate more than normal. You may also feel very tired or have blurry vision.  signs and symptoms of low blood sugar such as feeling anxious; confusion; dizziness; increased hunger; unusually weak or tired; increased sweating; shakiness; cold, clammy skin; irritable; headache; blurred vision; fast heartbeat; loss of consciousness  unusually weak or tired Side effects that usually do not require medical attention (report to your doctor or health care professional if they continue or are bothersome):  diarrhea  dizziness  gas  headache  nausea, vomiting  pain, redness, or irritation at site where injected  upset stomach This list may not describe all possible side effects. Call your doctor for medical advice about side effects. You may report side effects to FDA at 1-800-FDA-1088. Where should I keep my medicine? Keep out of the reach of children. Store in a refrigerator between 2 and 8 degrees C (36 and 46 degrees F). Protect from light. Allow to come to room temperature naturally. Do not use artificial heat. If protected from light, the injection may be stored at room temperature between 20 and 30 degrees C (70 and 86 degrees F) for 14 days. After the initial use, throw away any unused portion of a multiple dose vial after 14 days. Throw away unused portions of the ampules after use. NOTE: This sheet is a summary. It may not cover all possible information. If you have questions about this medicine, talk to your doctor, pharmacist, or health care provider.  2020 Elsevier/Gold Standard (2019-04-07 13:33:09)  

## 2020-05-22 ENCOUNTER — Inpatient Hospital Stay: Payer: Medicare Other

## 2020-05-22 ENCOUNTER — Other Ambulatory Visit: Payer: Self-pay

## 2020-05-22 ENCOUNTER — Inpatient Hospital Stay (HOSPITAL_BASED_OUTPATIENT_CLINIC_OR_DEPARTMENT_OTHER): Payer: Medicare Other | Admitting: Oncology

## 2020-05-22 ENCOUNTER — Telehealth: Payer: Self-pay | Admitting: Oncology

## 2020-05-22 VITALS — BP 133/92 | HR 95 | Temp 96.0°F | Resp 18 | Ht 76.0 in | Wt 210.0 lb

## 2020-05-22 DIAGNOSIS — D61818 Other pancytopenia: Secondary | ICD-10-CM | POA: Diagnosis not present

## 2020-05-22 DIAGNOSIS — C7B8 Other secondary neuroendocrine tumors: Secondary | ICD-10-CM

## 2020-05-22 DIAGNOSIS — N400 Enlarged prostate without lower urinary tract symptoms: Secondary | ICD-10-CM | POA: Diagnosis not present

## 2020-05-22 DIAGNOSIS — E340 Carcinoid syndrome: Secondary | ICD-10-CM | POA: Diagnosis not present

## 2020-05-22 DIAGNOSIS — C7A8 Other malignant neuroendocrine tumors: Secondary | ICD-10-CM | POA: Diagnosis not present

## 2020-05-22 DIAGNOSIS — R197 Diarrhea, unspecified: Secondary | ICD-10-CM | POA: Diagnosis not present

## 2020-05-22 DIAGNOSIS — C787 Secondary malignant neoplasm of liver and intrahepatic bile duct: Secondary | ICD-10-CM | POA: Diagnosis not present

## 2020-05-22 MED ORDER — OCTREOTIDE ACETATE 30 MG IM KIT
30.0000 mg | PACK | Freq: Once | INTRAMUSCULAR | Status: AC
  Administered 2020-05-22: 30 mg via INTRAMUSCULAR

## 2020-05-22 MED ORDER — OCTREOTIDE ACETATE 30 MG IM KIT
PACK | INTRAMUSCULAR | Status: AC
  Filled 2020-05-22: qty 1

## 2020-05-22 NOTE — Telephone Encounter (Signed)
Scheduled appointment per 8/31 los. Patient is aware of appointment date and time.

## 2020-05-22 NOTE — Patient Instructions (Signed)
Octreotide injection solution What is this medicine? OCTREOTIDE (ok TREE oh tide) is used to reduce blood levels of growth hormone in patients with a condition called acromegaly. This medicine also reduces flushing and watery diarrhea caused by certain types of cancer. This medicine may be used for other purposes; ask your health care provider or pharmacist if you have questions. COMMON BRAND NAME(S): Bynfezia, Sandostatin What should I tell my health care provider before I take this medicine? They need to know if you have any of these conditions:  diabetes  gallbladder disease  kidney disease  liver disease  thyroid disease  an unusual or allergic reaction to octreotide, other medicines, foods, dyes, or preservatives  pregnant or trying to get pregnant  breast-feeding How should I use this medicine? This medicine is for injection under the skin or into a vein (only in emergency situations). It is usually given by a health care professional in a hospital or clinic setting. If you get this medicine at home, you will be taught how to prepare and give this medicine. Allow the injection solution to come to room temperature before use. Do not warm it artificially. Use exactly as directed. Take your medicine at regular intervals. Do not take your medicine more often than directed. It is important that you put your used needles and syringes in a special sharps container. Do not put them in a trash can. If you do not have a sharps container, call your pharmacist or healthcare provider to get one. Talk to your pediatrician regarding the use of this medicine in children. Special care may be needed. Overdosage: If you think you have taken too much of this medicine contact a poison control center or emergency room at once. NOTE: This medicine is only for you. Do not share this medicine with others. What if I miss a dose? If you miss a dose, take it as soon as you can. If it is almost time for your  next dose, take only that dose. Do not take double or extra doses. What may interact with this medicine?  bromocriptine  certain medicines for blood pressure, heart disease, irregular heartbeat  cyclosporine  diuretics  medicines for diabetes, including insulin  quinidine This list may not describe all possible interactions. Give your health care provider a list of all the medicines, herbs, non-prescription drugs, or dietary supplements you use. Also tell them if you smoke, drink alcohol, or use illegal drugs. Some items may interact with your medicine. What should I watch for while using this medicine? Visit your doctor or health care professional for regular checks on your progress. To help reduce irritation at the injection site, use a different site for each injection and make sure the solution is at room temperature before use. This medicine may cause decreases in blood sugar. Signs of low blood sugar include chills, cool, pale skin or cold sweats, drowsiness, extreme hunger, fast heartbeat, headache, nausea, nervousness or anxiety, shakiness, trembling, unsteadiness, tiredness, or weakness. Contact your doctor or health care professional right away if you experience any of these symptoms. This medicine may increase blood sugar. Ask your healthcare provider if changes in diet or medicines are needed if you have diabetes. This medicine may cause a decrease in vitamin B12. You should make sure that you get enough vitamin B12 while you are taking this medicine. Discuss the foods you eat and the vitamins you take with your health care professional. What side effects may I notice from receiving this medicine? Side   effects that you should report to your doctor or health care professional as soon as possible:  allergic reactions like skin rash, itching or hives, swelling of the face, lips, or tongue  fast, slow, or irregular heartbeat  right upper belly pain  severe stomach pain  signs  and symptoms of high blood sugar such as being more thirsty or hungry or having to urinate more than normal. You may also feel very tired or have blurry vision.  signs and symptoms of low blood sugar such as feeling anxious; confusion; dizziness; increased hunger; unusually weak or tired; increased sweating; shakiness; cold, clammy skin; irritable; headache; blurred vision; fast heartbeat; loss of consciousness  unusually weak or tired Side effects that usually do not require medical attention (report to your doctor or health care professional if they continue or are bothersome):  diarrhea  dizziness  gas  headache  nausea, vomiting  pain, redness, or irritation at site where injected  upset stomach This list may not describe all possible side effects. Call your doctor for medical advice about side effects. You may report side effects to FDA at 1-800-FDA-1088. Where should I keep my medicine? Keep out of the reach of children. Store in a refrigerator between 2 and 8 degrees C (36 and 46 degrees F). Protect from light. Allow to come to room temperature naturally. Do not use artificial heat. If protected from light, the injection may be stored at room temperature between 20 and 30 degrees C (70 and 86 degrees F) for 14 days. After the initial use, throw away any unused portion of a multiple dose vial after 14 days. Throw away unused portions of the ampules after use. NOTE: This sheet is a summary. It may not cover all possible information. If you have questions about this medicine, talk to your doctor, pharmacist, or health care provider.  2020 Elsevier/Gold Standard (2019-04-07 13:33:09)  

## 2020-05-22 NOTE — Progress Notes (Signed)
[facility] OFFICE PROGRESS NOTE   Diagnosis: Carcinoid tumor  INTERVAL HISTORY:   Mr. Vanwyk returns as scheduled.  He continues monthly Sandostatin.  He has intermittent diarrhea, up to 5 times per day.  The diarrhea remains improved compared to when he was first diagnosed with a carcinoid tumor.  He reports the "larger "Imodium tablets relieve the diarrhea.  He is currently taking only 1-1/2 Imodium tablets per day.  No fever, flushing, or dyspnea.  He reports swelling at the left knee for greater than 20 years.  He has a good appetite, but does not eat in the mornings secondary to some foods causing diarrhea.  Objective:  Vital signs in last 24 hours:  Blood pressure (!) 133/92, pulse 95, temperature (!) 96 F (35.6 C), temperature source Tympanic, resp. rate 18, height 6' 4"  (1.93 m), weight 210 lb (95.3 kg), SpO2 99 %.    Resp: Lungs clear bilaterally Cardio: Regular rate and rhythm GI: The liver is palpable throughout the upper abdomen, nontender Vascular: No leg edema     Lab Results:  Lab Results  Component Value Date   WBC 4.7 03/27/2020   HGB 12.4 (L) 03/27/2020   HCT 36.9 (L) 03/27/2020   MCV 76.6 (L) 03/27/2020   PLT 183 03/27/2020   NEUTROABS 3.2 03/27/2020    CMP  Lab Results  Component Value Date   NA 138 03/27/2020   K 3.5 03/27/2020   CL 102 03/27/2020   CO2 25 03/27/2020   GLUCOSE 127 (H) 03/27/2020   BUN 12 03/27/2020   CREATININE 1.02 03/27/2020   CALCIUM 9.1 03/27/2020   PROT 7.5 03/27/2020   ALBUMIN 2.9 (L) 03/27/2020   AST 29 03/27/2020   ALT 28 03/27/2020   ALKPHOS 171 (H) 03/27/2020   BILITOT 0.8 03/27/2020   GFRNONAA >60 03/27/2020   GFRAA >60 03/27/2020    Medications: I have reviewed the patient's current medications.   Assessment/Plan: 1.Metastatic carcinoid tumor, biopsy of a liver lesion 10/23/2017 consistent with a well-differentiated neuroendocrine neoplasm, WHO grade 2,ki-6710%  CT abdomen/pelvis  10/13/2017-extensive liver metastases, cirrhosis, soft tissue mass in the small bowel mesentery versus a primary small bowel tumor, tiny lucent lesions in the pelvic bones  Elevated chromogranin A and 24-hour urine 5-HIAA  CT chest 11/24/2017-subpleural nodule in the left lower lobe with associated radiotracer activity on comparison DOTATATEPET scan. No additional evidence of thoracic metastasis.  DOTATATE PET scan3/01/2018-intense radiotracer accumulation with innumerable confluent hepatic metastasis; intense radiotracer activity within central mesenteric mass; 2 foci of uptake associated with the small bowel; intense radiotracer activity associated periaortic and paraspinal lymph nodes; more distant solitary small metastasis within the pleural space left lower lobe.  Monthly Sandostatin initiated 11/26/2017, dose increased to 30 mg 01/21/2018  Cycle 1 Lutathera 03/03/2018, monthly Sandostatin continued  Cycle 2 Lutathera 04/27/2018, monthly Sandostatin continued  Cycle 3 Lutathera 06/23/2018  Cycle 4 Lutathera 08/18/2018  Restaging dotatate PET scan 09/29/2018-multiple lesions again demonstrated within the liver which accumulate the radiotracer. The number of lesions which accumulate the tracer has decreased visually compared to the prior exam. Some lesions are no longer evident along the right hepatic margin. Remaining measurable lesions have radiotracer activity similar to prior. No new lesions present. Central mesenteric mass unchanged and remains avid for radiotracer. Adjacent lesion within the small bowel SUV max equal 10.5 compared to SUV max equal 15.8. Smaller lesion previously identified in the upper pelvis small bowel appears decreased in activity. No new metastatic lesions. Lesion position between  the right psoas muscle and spine SUV max equal to 21.8 compared with SUV max equal 26.2.  Dotatate PET 10/19/2019-no evidence of disease progression, stable multifocal hepatic metastases,  unchanged from post therapy scan 03/30/2019 and improved from pretherapy scan 01/24/2018.  Stable mesenteric mass, improved retroperitoneal lymph node adjacent to the celiac trunk, solitary small bowel lesion unchanged  Monthly Sandostatin continued  01/26/2020 chromogranin A level stable elevation  2.Diarrhea-likely secondary to carcinoid syndrome, improved with Sandostatin 3.History of anorexia/weight loss-improved 4.Prostatic hypertrophy;status post laser vaporization 01/05/2018 5. Mild pancytopenia following Lutathera treatment 6.Thoracic dermatomal zoster rash 04/02/2019 treated with Valtrex     Disposition: Mr. Mooneyhan appears unchanged, but he has lost weight over the past few months.  I encouraged him to increase the use of Imodium.  He will complete another treatment with Sandostatin today.  Mr. Pottenger will return for an office visit in 1 month.  The chromogranin was lower when he was here last month.  We will follow up on the chromogranin A level from today.  We will obtain a restaging Netspot if he continues to have diarrhea and weight loss when he returns next month.  Betsy Coder, MD  05/22/2020  9:31 AM

## 2020-05-23 LAB — CHROMOGRANIN A: Chromogranin A (ng/mL): 10000 ng/mL — ABNORMAL HIGH (ref 0.0–101.8)

## 2020-06-05 ENCOUNTER — Emergency Department (HOSPITAL_COMMUNITY): Payer: Medicare Other

## 2020-06-05 ENCOUNTER — Inpatient Hospital Stay (HOSPITAL_COMMUNITY)
Admission: EM | Admit: 2020-06-05 | Discharge: 2020-07-05 | DRG: 843 | Disposition: A | Discharge status: Skilled Nursing Facility | Payer: Medicare Other | Attending: Family Medicine | Admitting: Family Medicine

## 2020-06-05 ENCOUNTER — Other Ambulatory Visit: Payer: Self-pay

## 2020-06-05 ENCOUNTER — Encounter (HOSPITAL_COMMUNITY): Payer: Self-pay | Admitting: Pharmacy Technician

## 2020-06-05 DIAGNOSIS — K529 Noninfective gastroenteritis and colitis, unspecified: Secondary | ICD-10-CM | POA: Diagnosis not present

## 2020-06-05 DIAGNOSIS — E876 Hypokalemia: Secondary | ICD-10-CM | POA: Diagnosis not present

## 2020-06-05 DIAGNOSIS — R0902 Hypoxemia: Secondary | ICD-10-CM | POA: Diagnosis not present

## 2020-06-05 DIAGNOSIS — Z602 Problems related to living alone: Secondary | ICD-10-CM

## 2020-06-05 DIAGNOSIS — R34 Anuria and oliguria: Secondary | ICD-10-CM | POA: Diagnosis not present

## 2020-06-05 DIAGNOSIS — R509 Fever, unspecified: Secondary | ICD-10-CM | POA: Diagnosis not present

## 2020-06-05 DIAGNOSIS — Z8042 Family history of malignant neoplasm of prostate: Secondary | ICD-10-CM

## 2020-06-05 DIAGNOSIS — G9341 Metabolic encephalopathy: Secondary | ICD-10-CM | POA: Diagnosis present

## 2020-06-05 DIAGNOSIS — Z823 Family history of stroke: Secondary | ICD-10-CM

## 2020-06-05 DIAGNOSIS — R140 Abdominal distension (gaseous): Secondary | ICD-10-CM

## 2020-06-05 DIAGNOSIS — R112 Nausea with vomiting, unspecified: Secondary | ICD-10-CM

## 2020-06-05 DIAGNOSIS — H409 Unspecified glaucoma: Secondary | ICD-10-CM | POA: Diagnosis not present

## 2020-06-05 DIAGNOSIS — I4510 Unspecified right bundle-branch block: Secondary | ICD-10-CM | POA: Diagnosis present

## 2020-06-05 DIAGNOSIS — I119 Hypertensive heart disease without heart failure: Secondary | ICD-10-CM | POA: Diagnosis present

## 2020-06-05 DIAGNOSIS — E8809 Other disorders of plasma-protein metabolism, not elsewhere classified: Secondary | ICD-10-CM | POA: Diagnosis present

## 2020-06-05 DIAGNOSIS — R7989 Other specified abnormal findings of blood chemistry: Secondary | ICD-10-CM

## 2020-06-05 DIAGNOSIS — R110 Nausea: Secondary | ICD-10-CM | POA: Diagnosis not present

## 2020-06-05 DIAGNOSIS — R5381 Other malaise: Secondary | ICD-10-CM | POA: Diagnosis not present

## 2020-06-05 DIAGNOSIS — N138 Other obstructive and reflux uropathy: Secondary | ICD-10-CM | POA: Diagnosis present

## 2020-06-05 DIAGNOSIS — E340 Carcinoid syndrome: Secondary | ICD-10-CM | POA: Diagnosis not present

## 2020-06-05 DIAGNOSIS — R180 Malignant ascites: Secondary | ICD-10-CM | POA: Diagnosis present

## 2020-06-05 DIAGNOSIS — R188 Other ascites: Secondary | ICD-10-CM | POA: Diagnosis not present

## 2020-06-05 DIAGNOSIS — N323 Diverticulum of bladder: Secondary | ICD-10-CM | POA: Diagnosis present

## 2020-06-05 DIAGNOSIS — I872 Venous insufficiency (chronic) (peripheral): Secondary | ICD-10-CM | POA: Diagnosis present

## 2020-06-05 DIAGNOSIS — R748 Abnormal levels of other serum enzymes: Secondary | ICD-10-CM | POA: Diagnosis not present

## 2020-06-05 DIAGNOSIS — I248 Other forms of acute ischemic heart disease: Secondary | ICD-10-CM | POA: Diagnosis not present

## 2020-06-05 DIAGNOSIS — J9601 Acute respiratory failure with hypoxia: Secondary | ICD-10-CM | POA: Diagnosis not present

## 2020-06-05 DIAGNOSIS — C7A00 Malignant carcinoid tumor of unspecified site: Secondary | ICD-10-CM | POA: Diagnosis not present

## 2020-06-05 DIAGNOSIS — F69 Unspecified disorder of adult personality and behavior: Secondary | ICD-10-CM | POA: Diagnosis not present

## 2020-06-05 DIAGNOSIS — M6282 Rhabdomyolysis: Secondary | ICD-10-CM | POA: Diagnosis not present

## 2020-06-05 DIAGNOSIS — R634 Abnormal weight loss: Secondary | ICD-10-CM | POA: Diagnosis present

## 2020-06-05 DIAGNOSIS — K639 Disease of intestine, unspecified: Secondary | ICD-10-CM | POA: Diagnosis not present

## 2020-06-05 DIAGNOSIS — C7A1 Malignant poorly differentiated neuroendocrine tumors: Secondary | ICD-10-CM | POA: Diagnosis not present

## 2020-06-05 DIAGNOSIS — Z515 Encounter for palliative care: Secondary | ICD-10-CM | POA: Diagnosis not present

## 2020-06-05 DIAGNOSIS — R197 Diarrhea, unspecified: Secondary | ICD-10-CM | POA: Diagnosis not present

## 2020-06-05 DIAGNOSIS — Z7401 Bed confinement status: Secondary | ICD-10-CM | POA: Diagnosis not present

## 2020-06-05 DIAGNOSIS — C7A098 Malignant carcinoid tumors of other sites: Secondary | ICD-10-CM | POA: Diagnosis not present

## 2020-06-05 DIAGNOSIS — C7889 Secondary malignant neoplasm of other digestive organs: Secondary | ICD-10-CM | POA: Diagnosis not present

## 2020-06-05 DIAGNOSIS — Z20822 Contact with and (suspected) exposure to covid-19: Secondary | ICD-10-CM | POA: Diagnosis present

## 2020-06-05 DIAGNOSIS — D61818 Other pancytopenia: Secondary | ICD-10-CM | POA: Diagnosis present

## 2020-06-05 DIAGNOSIS — R7401 Elevation of levels of liver transaminase levels: Secondary | ICD-10-CM | POA: Diagnosis not present

## 2020-06-05 DIAGNOSIS — N320 Bladder-neck obstruction: Secondary | ICD-10-CM | POA: Diagnosis not present

## 2020-06-05 DIAGNOSIS — C7801 Secondary malignant neoplasm of right lung: Secondary | ICD-10-CM | POA: Diagnosis not present

## 2020-06-05 DIAGNOSIS — Z6827 Body mass index (BMI) 27.0-27.9, adult: Secondary | ICD-10-CM

## 2020-06-05 DIAGNOSIS — Z7189 Other specified counseling: Secondary | ICD-10-CM | POA: Diagnosis not present

## 2020-06-05 DIAGNOSIS — R7303 Prediabetes: Secondary | ICD-10-CM | POA: Diagnosis present

## 2020-06-05 DIAGNOSIS — R109 Unspecified abdominal pain: Secondary | ICD-10-CM

## 2020-06-05 DIAGNOSIS — N179 Acute kidney failure, unspecified: Secondary | ICD-10-CM | POA: Diagnosis not present

## 2020-06-05 DIAGNOSIS — L089 Local infection of the skin and subcutaneous tissue, unspecified: Secondary | ICD-10-CM | POA: Diagnosis present

## 2020-06-05 DIAGNOSIS — J90 Pleural effusion, not elsewhere classified: Secondary | ICD-10-CM | POA: Diagnosis not present

## 2020-06-05 DIAGNOSIS — N170 Acute kidney failure with tubular necrosis: Secondary | ICD-10-CM | POA: Diagnosis not present

## 2020-06-05 DIAGNOSIS — J189 Pneumonia, unspecified organism: Secondary | ICD-10-CM | POA: Diagnosis not present

## 2020-06-05 DIAGNOSIS — R1115 Cyclical vomiting syndrome unrelated to migraine: Secondary | ICD-10-CM | POA: Diagnosis not present

## 2020-06-05 DIAGNOSIS — I10 Essential (primary) hypertension: Secondary | ICD-10-CM | POA: Diagnosis not present

## 2020-06-05 DIAGNOSIS — E860 Dehydration: Secondary | ICD-10-CM | POA: Diagnosis present

## 2020-06-05 DIAGNOSIS — C778 Secondary and unspecified malignant neoplasm of lymph nodes of multiple regions: Secondary | ICD-10-CM | POA: Diagnosis not present

## 2020-06-05 DIAGNOSIS — R7402 Elevation of levels of lactic acid dehydrogenase (LDH): Secondary | ICD-10-CM | POA: Diagnosis not present

## 2020-06-05 DIAGNOSIS — C787 Secondary malignant neoplasm of liver and intrahepatic bile duct: Secondary | ICD-10-CM | POA: Diagnosis present

## 2020-06-05 DIAGNOSIS — R627 Adult failure to thrive: Secondary | ICD-10-CM | POA: Diagnosis present

## 2020-06-05 DIAGNOSIS — R778 Other specified abnormalities of plasma proteins: Secondary | ICD-10-CM | POA: Diagnosis not present

## 2020-06-05 DIAGNOSIS — N390 Urinary tract infection, site not specified: Secondary | ICD-10-CM | POA: Diagnosis not present

## 2020-06-05 DIAGNOSIS — A419 Sepsis, unspecified organism: Secondary | ICD-10-CM | POA: Diagnosis not present

## 2020-06-05 DIAGNOSIS — R1084 Generalized abdominal pain: Secondary | ICD-10-CM | POA: Diagnosis not present

## 2020-06-05 DIAGNOSIS — R791 Abnormal coagulation profile: Secondary | ICD-10-CM | POA: Diagnosis present

## 2020-06-05 DIAGNOSIS — R4182 Altered mental status, unspecified: Secondary | ICD-10-CM | POA: Diagnosis present

## 2020-06-05 DIAGNOSIS — C7B8 Other secondary neuroendocrine tumors: Secondary | ICD-10-CM

## 2020-06-05 DIAGNOSIS — E872 Acidosis: Secondary | ICD-10-CM | POA: Diagnosis not present

## 2020-06-05 DIAGNOSIS — R630 Anorexia: Secondary | ICD-10-CM | POA: Diagnosis not present

## 2020-06-05 DIAGNOSIS — E861 Hypovolemia: Secondary | ICD-10-CM | POA: Diagnosis present

## 2020-06-05 DIAGNOSIS — B957 Other staphylococcus as the cause of diseases classified elsewhere: Secondary | ICD-10-CM | POA: Diagnosis not present

## 2020-06-05 DIAGNOSIS — M2550 Pain in unspecified joint: Secondary | ICD-10-CM | POA: Diagnosis not present

## 2020-06-05 DIAGNOSIS — Z8249 Family history of ischemic heart disease and other diseases of the circulatory system: Secondary | ICD-10-CM

## 2020-06-05 DIAGNOSIS — R1111 Vomiting without nausea: Secondary | ICD-10-CM | POA: Diagnosis not present

## 2020-06-05 DIAGNOSIS — Z79899 Other long term (current) drug therapy: Secondary | ICD-10-CM

## 2020-06-05 DIAGNOSIS — C7A8 Other malignant neuroendocrine tumors: Secondary | ICD-10-CM | POA: Diagnosis present

## 2020-06-05 DIAGNOSIS — J811 Chronic pulmonary edema: Secondary | ICD-10-CM | POA: Diagnosis not present

## 2020-06-05 DIAGNOSIS — R7881 Bacteremia: Secondary | ICD-10-CM

## 2020-06-05 DIAGNOSIS — N401 Enlarged prostate with lower urinary tract symptoms: Secondary | ICD-10-CM | POA: Diagnosis not present

## 2020-06-05 DIAGNOSIS — H548 Legal blindness, as defined in USA: Secondary | ICD-10-CM | POA: Diagnosis present

## 2020-06-05 DIAGNOSIS — R609 Edema, unspecified: Secondary | ICD-10-CM | POA: Diagnosis not present

## 2020-06-05 DIAGNOSIS — Z66 Do not resuscitate: Secondary | ICD-10-CM

## 2020-06-05 DIAGNOSIS — R0602 Shortness of breath: Secondary | ICD-10-CM

## 2020-06-05 DIAGNOSIS — R946 Abnormal results of thyroid function studies: Secondary | ICD-10-CM | POA: Diagnosis present

## 2020-06-05 DIAGNOSIS — N400 Enlarged prostate without lower urinary tract symptoms: Secondary | ICD-10-CM | POA: Diagnosis not present

## 2020-06-05 DIAGNOSIS — R1110 Vomiting, unspecified: Secondary | ICD-10-CM | POA: Diagnosis not present

## 2020-06-05 DIAGNOSIS — J9811 Atelectasis: Secondary | ICD-10-CM | POA: Diagnosis not present

## 2020-06-05 DIAGNOSIS — Z9181 History of falling: Secondary | ICD-10-CM | POA: Diagnosis not present

## 2020-06-05 DIAGNOSIS — R410 Disorientation, unspecified: Secondary | ICD-10-CM | POA: Diagnosis not present

## 2020-06-05 DIAGNOSIS — R000 Tachycardia, unspecified: Secondary | ICD-10-CM | POA: Diagnosis not present

## 2020-06-05 DIAGNOSIS — N3289 Other specified disorders of bladder: Secondary | ICD-10-CM | POA: Diagnosis not present

## 2020-06-05 DIAGNOSIS — B9620 Unspecified Escherichia coli [E. coli] as the cause of diseases classified elsewhere: Secondary | ICD-10-CM | POA: Diagnosis not present

## 2020-06-05 DIAGNOSIS — M6281 Muscle weakness (generalized): Secondary | ICD-10-CM | POA: Diagnosis not present

## 2020-06-05 DIAGNOSIS — R159 Full incontinence of feces: Secondary | ICD-10-CM | POA: Diagnosis present

## 2020-06-05 DIAGNOSIS — A09 Infectious gastroenteritis and colitis, unspecified: Secondary | ICD-10-CM | POA: Diagnosis not present

## 2020-06-05 DIAGNOSIS — R600 Localized edema: Secondary | ICD-10-CM | POA: Diagnosis not present

## 2020-06-05 DIAGNOSIS — N5089 Other specified disorders of the male genital organs: Secondary | ICD-10-CM | POA: Diagnosis present

## 2020-06-05 DIAGNOSIS — L8990 Pressure ulcer of unspecified site, unspecified stage: Secondary | ICD-10-CM | POA: Insufficient documentation

## 2020-06-05 DIAGNOSIS — G9389 Other specified disorders of brain: Secondary | ICD-10-CM | POA: Diagnosis not present

## 2020-06-05 LAB — CBC WITH DIFFERENTIAL/PLATELET
Abs Immature Granulocytes: 0.01 10*3/uL (ref 0.00–0.07)
Basophils Absolute: 0 10*3/uL (ref 0.0–0.1)
Basophils Relative: 0 %
Eosinophils Absolute: 0 10*3/uL (ref 0.0–0.5)
Eosinophils Relative: 1 %
HCT: 37.2 % — ABNORMAL LOW (ref 39.0–52.0)
Hemoglobin: 12.2 g/dL — ABNORMAL LOW (ref 13.0–17.0)
Immature Granulocytes: 0 %
Lymphocytes Relative: 40 %
Lymphs Abs: 1.6 10*3/uL (ref 0.7–4.0)
MCH: 26.1 pg (ref 26.0–34.0)
MCHC: 32.8 g/dL (ref 30.0–36.0)
MCV: 79.5 fL — ABNORMAL LOW (ref 80.0–100.0)
Monocytes Absolute: 0.2 10*3/uL (ref 0.1–1.0)
Monocytes Relative: 6 %
Neutro Abs: 2.2 10*3/uL (ref 1.7–7.7)
Neutrophils Relative %: 53 %
Platelets: 198 10*3/uL (ref 150–400)
RBC: 4.68 MIL/uL (ref 4.22–5.81)
RDW: 19.9 % — ABNORMAL HIGH (ref 11.5–15.5)
WBC: 4 10*3/uL (ref 4.0–10.5)
nRBC: 0 % (ref 0.0–0.2)

## 2020-06-05 LAB — RAPID URINE DRUG SCREEN, HOSP PERFORMED
Amphetamines: NOT DETECTED
Barbiturates: NOT DETECTED
Benzodiazepines: NOT DETECTED
Cocaine: NOT DETECTED
Opiates: NOT DETECTED
Tetrahydrocannabinol: NOT DETECTED

## 2020-06-05 LAB — CK: Total CK: 147 U/L (ref 49–397)

## 2020-06-05 LAB — COMPREHENSIVE METABOLIC PANEL
ALT: 34 U/L (ref 0–44)
AST: 60 U/L — ABNORMAL HIGH (ref 15–41)
Albumin: 2.7 g/dL — ABNORMAL LOW (ref 3.5–5.0)
Alkaline Phosphatase: 333 U/L — ABNORMAL HIGH (ref 38–126)
Anion gap: 15 (ref 5–15)
BUN: 10 mg/dL (ref 8–23)
CO2: 20 mmol/L — ABNORMAL LOW (ref 22–32)
Calcium: 8.5 mg/dL — ABNORMAL LOW (ref 8.9–10.3)
Chloride: 106 mmol/L (ref 98–111)
Creatinine, Ser: 1.2 mg/dL (ref 0.61–1.24)
GFR calc Af Amer: >60 mL/min (ref >60)
GFR calc non Af Amer: 57 mL/min — ABNORMAL LOW (ref >60)
Glucose, Bld: 184 mg/dL — ABNORMAL HIGH (ref 70–99)
Potassium: 3.6 mmol/L (ref 3.5–5.1)
Sodium: 141 mmol/L (ref 135–145)
Total Bilirubin: 1.2 mg/dL (ref 0.3–1.2)
Total Protein: 7 g/dL (ref 6.5–8.1)

## 2020-06-05 LAB — SARS CORONAVIRUS 2 BY RT PCR (HOSPITAL ORDER, PERFORMED IN ~~LOC~~ HOSPITAL LAB): SARS Coronavirus 2: NEGATIVE

## 2020-06-05 LAB — T4, FREE: Free T4: 1.23 ng/dL — ABNORMAL HIGH (ref 0.61–1.12)

## 2020-06-05 LAB — URINALYSIS, ROUTINE W REFLEX MICROSCOPIC
Bacteria, UA: NONE SEEN
Bilirubin Urine: NEGATIVE
Glucose, UA: NEGATIVE mg/dL
Hgb urine dipstick: NEGATIVE
Ketones, ur: NEGATIVE mg/dL
Nitrite: NEGATIVE
Protein, ur: 30 mg/dL — AB
Specific Gravity, Urine: 1.016 (ref 1.005–1.030)
pH: 6 (ref 5.0–8.0)

## 2020-06-05 LAB — LACTIC ACID, PLASMA
Lactic Acid, Venous: 5.8 mmol/L (ref 0.5–1.9)
Lactic Acid, Venous: 4.4 mmol/L (ref 0.5–1.9)
Lactic Acid, Venous: 3.9 mmol/L (ref 0.5–1.9)

## 2020-06-05 LAB — PROCALCITONIN: Procalcitonin: 0.2 ng/mL

## 2020-06-05 LAB — LIPASE, BLOOD: Lipase: 16 U/L (ref 11–51)

## 2020-06-05 LAB — PROTIME-INR
INR: 1.3 — ABNORMAL HIGH (ref 0.8–1.2)
Prothrombin Time: 15.9 seconds — ABNORMAL HIGH (ref 11.4–15.2)

## 2020-06-05 LAB — TSH: TSH: 7.871 u[IU]/mL — ABNORMAL HIGH (ref 0.350–4.500)

## 2020-06-05 LAB — ETHANOL: Alcohol, Ethyl (B): <10 mg/dL (ref <10)

## 2020-06-05 LAB — AMMONIA: Ammonia: 39 umol/L — ABNORMAL HIGH (ref 9–35)

## 2020-06-05 LAB — TROPONIN I (HIGH SENSITIVITY): Troponin I (High Sensitivity): 10 ng/L (ref <18)

## 2020-06-05 MED ORDER — PROCHLORPERAZINE EDISYLATE 10 MG/2ML IJ SOLN
5.0000 mg | Freq: Once | INTRAMUSCULAR | Status: AC
  Administered 2020-06-05: 5 mg via INTRAVENOUS
  Filled 2020-06-05: qty 2

## 2020-06-05 MED ORDER — ENOXAPARIN SODIUM 40 MG/0.4ML ~~LOC~~ SOLN
40.0000 mg | SUBCUTANEOUS | Status: DC
  Administered 2020-06-06 – 2020-06-07 (×2): 40 mg via SUBCUTANEOUS
  Filled 2020-06-05 (×3): qty 0.4

## 2020-06-05 MED ORDER — ONDANSETRON HCL 4 MG PO TABS: 4.0000 mg | ORAL_TABLET | Freq: Four times a day (QID) | ORAL | Status: DC | PRN

## 2020-06-05 MED ORDER — ACETAMINOPHEN 325 MG PO TABS
650.0000 mg | ORAL_TABLET | Freq: Four times a day (QID) | ORAL | Status: DC | PRN
  Administered 2020-06-05: 650 mg via ORAL
  Filled 2020-06-05: qty 2

## 2020-06-05 MED ORDER — DIPHENHYDRAMINE HCL 50 MG/ML IJ SOLN
12.5000 mg | Freq: Once | INTRAMUSCULAR | Status: AC
  Administered 2020-06-05: 12.5 mg via INTRAVENOUS
  Filled 2020-06-05: qty 1

## 2020-06-05 MED ORDER — SODIUM CHLORIDE 0.9 % IV SOLN
3.0000 g | Freq: Once | INTRAVENOUS | Status: AC
  Administered 2020-06-05: 3 g via INTRAVENOUS
  Filled 2020-06-05: qty 3

## 2020-06-05 MED ORDER — LACTATED RINGERS IV BOLUS
1000.0000 mL | Freq: Once | INTRAVENOUS | Status: AC
  Administered 2020-06-05: 1000 mL via INTRAVENOUS

## 2020-06-05 MED ORDER — ONDANSETRON HCL 4 MG/2ML IJ SOLN
4.0000 mg | Freq: Once | INTRAMUSCULAR | Status: AC
  Administered 2020-06-05: 4 mg via INTRAVENOUS
  Filled 2020-06-05: qty 2

## 2020-06-05 MED ORDER — FINASTERIDE 5 MG PO TABS
5.0000 mg | ORAL_TABLET | Freq: Every day | ORAL | Status: DC
  Administered 2020-06-06: 5 mg via ORAL
  Filled 2020-06-05: qty 1

## 2020-06-05 MED ORDER — IOHEXOL 300 MG/ML  SOLN
100.0000 mL | Freq: Once | INTRAMUSCULAR | Status: AC | PRN
  Administered 2020-06-05: 100 mL via INTRAVENOUS

## 2020-06-05 MED ORDER — SODIUM CHLORIDE 0.9 % IV SOLN: INTRAVENOUS | Status: DC

## 2020-06-05 MED ORDER — ONDANSETRON HCL 4 MG/2ML IJ SOLN
4.0000 mg | Freq: Once | INTRAMUSCULAR | Status: AC
  Administered 2020-06-05: 4 mg via INTRAVENOUS

## 2020-06-05 MED ORDER — MORPHINE SULFATE (PF) 2 MG/ML IV SOLN
1.0000 mg | Freq: Once | INTRAVENOUS | Status: AC
  Administered 2020-06-05: 1 mg via INTRAVENOUS
  Filled 2020-06-05: qty 1

## 2020-06-05 MED ORDER — ONDANSETRON HCL 4 MG/2ML IJ SOLN
4.0000 mg | Freq: Four times a day (QID) | INTRAMUSCULAR | Status: DC | PRN
  Administered 2020-06-05 – 2020-06-19 (×4): 4 mg via INTRAVENOUS
  Filled 2020-06-05 (×5): qty 2

## 2020-06-05 MED ORDER — ACETAMINOPHEN 650 MG RE SUPP: 650.0000 mg | Freq: Four times a day (QID) | RECTAL | Status: DC | PRN

## 2020-06-05 MED ORDER — SODIUM CHLORIDE 0.9 % IV BOLUS
1000.0000 mL | Freq: Once | INTRAVENOUS | Status: AC
  Administered 2020-06-05: 1000 mL via INTRAVENOUS

## 2020-06-05 MED ORDER — TAMSULOSIN HCL 0.4 MG PO CAPS
0.4000 mg | ORAL_CAPSULE | Freq: Every day | ORAL | Status: DC
  Administered 2020-06-06: 0.4 mg via ORAL
  Filled 2020-06-05: qty 1

## 2020-06-05 NOTE — ED Notes (Signed)
Call daughter at [facility] at [phone]

## 2020-06-05 NOTE — Progress Notes (Signed)
FPTS Interim Progress Note  Reached out with patient's oncologist.  At this time will rule out infectious GI pathology, rehydrate, and PO as tolerated. If negative, start imodium. If no improvement with imodium, can consider dose of Sandostatin.  Dr. Learta Codding will plan to see patient either 9/15 or 9/16 and provide any further recs. If any acute changes or other concerns, feel free to reach back out.  Appreciate his assistance in caring for Mr. Minahan.  Plan as indicated in H&P  Danna Hefty, DO 06/05/2020, 7:30 PM PGY-3, [facility] Service pager [phone]

## 2020-06-05 NOTE — Progress Notes (Signed)
FPTS Interim Progress Note  S:Went to patient bedside for request for pain medications. Patient was given PO Tylenol but subsequently vomited. Patient unable to describe the quality of the abdominal pain and states that it is "constant over the last 30 days" though his son states that it has been only over the last 3 days and comes and goes. Patient reports pain is relieved with emesis. Patient has not had anything to eat today and cannot tolerate PO due to emesis.   O: BP (!) 132/91 (BP Location: Right Arm)   Pulse 93   Temp 97.6 F (36.4 C) (Oral)   Resp 20   SpO2 98%    General: ill-appearing, supine in hallway bed, mild distress Abd: firm, distended, mildly tender to palpation in epigastric region, non-typmanic, bowel sounds present in all quadrants.   A/P: 79 year old male with a history of metastatic carcinoid tumor, BPH, and HTN admitted for weakness, vomiting, and diarrhea. Physical exam notable for abdominal firmness, distension, and tenderness. Prior CT scan showed bowel wall thickening within central small bowel loops and right colon. Pain is likely due to colitis (C. Diff vs viral gastroenteritis). Given prior description of colicky pain consider differential diagnosis of gallstone, hemorrhage (less likely due to stable vital signs) or mesenteric ischemia (less likely due to lack of CAD, improving lactic acidosis, and stable vital signs).Patient receiving a one time dose of 1mg  of morphine for pain. Continue to monitor pain level and abdominal distension. NPO if patient continues to have vomiting.   Rise Patience, D.O.  PGY-1  Family Medicine  [phone] 06/05/2020 10:31 PM

## 2020-06-05 NOTE — H&P (Addendum)
[facility] Admission History and Physical Service Pager: [phone]  Patient name: Charles Schmidt Medical record number: [id number] Date of birth: July 16, 1941 Age: 79 y.o. Gender: male  Primary Care Provider: Nolene Ebbs, MD Consultants: Oncology Code Status: FULL  Preferred Emergency Contact: Charles Schmidt (daughter-in-law)  Chief Complaint: Weakness, Vomiting, Diarrhea  Assessment and Plan: Javyn Havlin is a 79 y.o. male presenting with weakness in the setting of vomiting and diarrhea. PMH is significant for metastatic carcinoid tumor, BPH s/p laser vaporization, and HTN.  Diffuse Weakness likely 2/2 Dehydration Patient reports worsening weakness, unclear timeline but today he had a fall in the bathroom. Patient recalls the episode and reports no LOC. Patient states he's been feeling weak over the last 30 days, but per family members this seems to be an acute change over past 1-3 days. Currently stable with all vitals within normal limits. Appears very fatigued and grossly weak but good extremity strength on exam. Workup in the ED revealed elevated lactic acid to 5.8, repeat 4.4. Head CT, CBC with diff, CMP, lipase, troponin, CK, EKG, Utox and UA were all unremarkable. Procalcitonin low at 0.20.  TSH/Free T4 mildly elevated 7.871/1.23, PT/INR elevated to 15.9/1.3 and ammonia 39. CT abdomen/pelvis could not exclude colitis and showed mesenteric mass slightly enlarged from prior. Suspect dehydration as underlying etiology of weakness. May also be component of metastatic carcinoid disease. Differential for nausea/vomiting leading to dehydration includes C. Diff Colitis, viral gastroenteritis, and progression of carcinoid disease. No concern for bowel obstruction on imaging. -Admit to med-surg, attending Dr. McDiarmid -Regular diet, OOB with assistance -PT/OT eval -Repeat lactic acid -s/p 1L fluid bolus x2 in the ED -mIVF with NS @ 150 cc/hr -IV Zofran 4mg  q6h  prn -s/p Unasyn x1 in the ED, will d/c antibiotics - Oncologist, Dr. Learta Codding, to see patient 9/15 or 9/16  Acute on Chronic Vomiting, Diarrhea Patient presents with vomiting and diarrhea, also of unclear duration. Patient has ~5 episodes of diarrhea per day at baseline in setting of known carcinoid syndrome currently treated with monthly Sandostatin and daily imodium (questionable if compliant) but has had 6-10 "watery" episodes more recently. Per patient, he has had vomiting x1 month but daughter in law is unaware of any vomiting and thinks this is new in the last 1-2 days. Received Zofran x2 and Compazine in the ED, but still unable to tolerate PO. Differential includes C. Diff Colitis, viral gastroenteritis and progression of carcinoid disease.  CT abdomen/pelvis could not exclude colitis and showed slightly enlarged mesenteric mass but was otherwise unremarkable. No evidence of obstruction. -s/p 1L fluid bolus x2 -mIVF NS @ 150cc/hr -IV Zofran 4mg  q6h prn -GI pathogen panel -C. Diff PCR -s/p Unasyn x1, will not continue antibiotics -If no improvement and C. Diff negative, will give Imodium - Per Dr. Learta Codding, Consider sandostatin if not improving with Imodium   Hx Metastatic Carcinoid Tumor  Chronic Mesenteric Tumor Patient's oncologist is Dr. Betsy Coder at [facility]. He receives monthly sandostatin, last dose 05/22/20. At that time, patient was reportedly having 5 episodes of diarrhea per day and weight loss. Per note, Dr Benay Spice planned re-staging at next visit if diarrhea and weight loss continue. Patient with extensive liver metastasis since 2019 that have been stable. Received Lutathera treatment in 2019. -Oncology to see tomorrow -If GI panel/C. Diff negative, consider sandostatin  -Likely will need re-staging as outpatient  Hx Prostatic Hypertrophy s/p laser vaporization Chronic, stable. On Flomax and Proscar at home. -Continue home Flomax 0.4mg   daily and Proscar 5mg   daily  Hx HTN Patient has been normotensive since arrival. Home meds: amlodipine 5mg  daily. -Holding amlodipine for now -Monitor BP  Elevated TSH, Free T4 Patient's TSH and Free T4 slightly elevated at 7.871 and 1.23 respectively.  -Recheck as outpatient   FEN/GI: Regular diet Prophylaxis: Lovenox  Disposition: med-surg  History of Present Illness:  Charles Schmidt is a 79 y.o. male presenting with weakness in the setting of diarrhea and vomiting. Patient is a poor historian and is unable to state when exactly his symptoms started but he thinks one month ago. Per daughter in law, patient's symptoms seem to be more acute over last 1-2 days. He has ~5 episodes of diarrhea at baseline but has had 6-10 episodes per day recently. His diarrhea and vomiting have been nonbloody. Denies fever, chills, respiratory symptoms. Endorses some crampy abdominal pain that comes in waves with the nausea/vomiting. Denies sick contacts.  Patient is concerned primarily about his weakness, as he has become so weak that he fell in the bathroom today. Patient recalls the incident and denies LOC. Patient reportedly lives with friends, but they were unable to be reached for additional history. Patient has family nearby but no significant help at home. He is independent in all his ADLs and iADLs at baseline.  Of note, patient has had gradual weight loss- he reports 50lb unintentional weight loss over past 1 year.   Review Of Systems: Per HPI with the following additions:  Review of Systems  Constitutional: Negative for fever.  Respiratory: Negative for cough and shortness of breath.   Cardiovascular: Negative for chest pain.  Gastrointestinal: Positive for abdominal pain, diarrhea and vomiting.  Neurological: Positive for weakness.     Patient Active Problem List   Diagnosis Date Noted  . Genetic testing 12/18/2017  . Family history of cancer in son   . Family history of prostate cancer   . Metastatic  malignant neuroendocrine tumor to liver ([place]) 10/28/2017  . Essential hypertension (primary) 05/04/2017  . Hypokalemia 05/04/2017  . Benign prostatic hyperplasia 05/04/2017  . Glaucoma 05/04/2017  . Benign hypertensive heart disease without heart failure 05/04/2017  . Microcytic anemia 05/04/2017  . Prediabetes 05/04/2017  . Hypocalcemia 05/04/2017  . Psoriasis 05/04/2017    Past Medical History: Past Medical History:  Diagnosis Date  . BPH with obstruction/lower urinary tract symptoms   . Cataract, mature    12-28-2017  per pt right eye, legally blind  . Diarrhea concurrent with and due to carcinoid syndrome ([place])    12-28-2017 improvement since started Sandostatin monthly  . ED (erectile dysfunction)   . Elevated PSA   . Family history of cancer in son   . Family history of prostate cancer   . Foley catheter in place   . Glaucoma, left eye   . History of colon polyps   . History of urinary retention    2015; 2016; 01/ 2019  . Hypertension   . Metastatic malignant neuroendocrine tumor to liver [facility]) oncoloigst-  dr Benay Spice   dx 10-23-2017 by liver bx-- WHO grade 2; small bowel mesenteric mass, cirrhosis, mets left lower lobe solitary nodule ,  periaortic and paraspinal lymph nodes METS--  started monthly Sandostatin 11-20-2017  . Microcytic anemia   . RBBB (right bundle branch block with left anterior fascicular block)   . Urinary retention   . Venous reflux     Past Surgical History: Past Surgical History:  Procedure Laterality Date  . INGUINAL HERNIA REPAIR  1990s   unsure side  . IR US GUIDE BX ASP/DRAIN  10/23/2017  . QUADRICEPS TENDON REPAIR Left 01-08-2011   dr Veverly Fells  . THULIUM LASER TURP (TRANSURETHRAL RESECTION OF PROSTATE) N/A 01/05/2018   Procedure: Marcelino Duster LASER OF THE PROSTATE;  Surgeon: Festus Aloe, MD;  Location: [facility];  Service: Urology;  Laterality: N/A;  . TONSILLECTOMY  child    Social History: Social History   Tobacco  Use  . Smoking status: Never Smoker  . Smokeless tobacco: Never Used  Vaping Use  . Vaping Use: Never used  Substance Use Topics  . Alcohol use: Not Currently    Alcohol/week: 1.0 standard drink    Types: 1 Standard drinks or equivalent per week    Comment: week  . Drug use: No    Family History: Family History  Problem Relation Age of Onset  . Hypertension Mother   . Stroke Father   . Prostate cancer Father        'slow growing'  . Kidney Stones Sister   . Cancer Son 69       neuroendocrine tumors of small intestine   Allergies and Medications: No Known Allergies No current facility-administered medications on file prior to encounter.   Current Outpatient Medications on File Prior to Encounter  Medication Sig Dispense Refill  . amLODipine (NORVASC) 5 MG tablet Take 1 tablet (5 mg total) by mouth at bedtime. 30 tablet 0  . dorzolamide-timolol (COSOPT) 22.3-6.8 MG/ML ophthalmic solution Place 1 drop into the left eye 2 (two) times daily.     . finasteride (PROSCAR) 5 MG tablet Take 5 mg by mouth at bedtime.     . hydrochlorothiazide (HYDRODIURIL) 12.5 MG tablet Take 12.5 mg by mouth daily.    Marland Kitchen loperamide (IMODIUM) 2 MG capsule Take 2-4 mg by mouth 4 (four) times daily as needed for diarrhea or loose stools.     . Multiple Vitamin (MULTIVITAMIN WITH MINERALS) TABS tablet Take 1 tablet by mouth at bedtime. Centrum Silver    . potassium chloride (KLOR-CON) 10 MEQ tablet Take 20 mEq by mouth daily.    . Saw Palmetto, Serenoa repens, (SAW PALMETTO PO) Take 2 tablets by mouth at bedtime.     . tamsulosin (FLOMAX) 0.4 MG CAPS capsule Take 1 capsule (0.4 mg total) by mouth daily after supper. (Patient taking differently: Take 0.4 mg by mouth at bedtime. ) 30 capsule 0  . Travoprost, BAK Free, (TRAVATAN) 0.004 % SOLN ophthalmic solution Place 1 drop into the left eye at bedtime.     Marland Kitchen HYDROcodone-acetaminophen (NORCO/VICODIN) 5-325 MG tablet Take 1 tablet by mouth every 6 (six) hours  as needed. (Patient not taking: Reported on 08/08/2019) 15 tablet 0    Objective: BP 124/80   Pulse 83   Temp 98 F (36.7 C) (Oral)   Resp 17   SpO2 95%  Exam: General: alert, appears very fatigued, globally weak and uncomfortable Eyes: PERRL, normal sclera and conjunctiva ENTM: moist mucous membranes, no oropharyngeal erythema, edema or exudates Neck: no cervical lymphadenopathy Cardiovascular: RRR, normal S1/S2 without m/r/g Respiratory: normal WOB, good air movement, lungs CTAB Gastrointestinal: hyperactive bowel sounds, soft, nontender, nondistended Ext: 2+ distal pulses, no peripheral edema, 5/5 strength in all four extremities Derm: Multiple scratch marks and excoriations on bilateral upper and lower extremities, appear chronic Neuro: alert, oriented to person, place, time Psych: appropriate affect, mumbled speech, appears very fatigued  Labs and Imaging: CBC BMET  Recent Labs  Lab 06/05/20 0957  WBC 4.0  HGB 12.2*  HCT 37.2*  PLT 198   Recent Labs  Lab 06/05/20 0957  NA 141  K 3.6  CL 106  CO2 20*  BUN 10  CREATININE 1.20  GLUCOSE 184*  CALCIUM 8.5*    Lactic acid: 5.8>4.4 Procal 0.20  EKG: NSR at 72, RBBB (unchanged from prior), corrected QTc [place], MD 06/05/2020, 3:22 PM PGY-1, [place] [facility] pager: [phone], text pages welcome  Upper Level Addendum: I have seen and evaluated this patient along with Dr. Rock Nephew and reviewed the above note, making necessary revisions in green.   Mina Marble, D.O. 06/05/2020, 7:00 PM PGY-3, [place]

## 2020-06-05 NOTE — ED Notes (Signed)
Pt repositioned; given tylenol earlier for pain, then vomited. Requesting IV pain medication for abd pain. Iv became dislodged, retaped and flushed without difficulty

## 2020-06-05 NOTE — ED Notes (Signed)
Gave patient a urinal

## 2020-06-05 NOTE — ED Notes (Signed)
Help get patient on the monitor into a gown did ekg shown to Dr Tyrone Nine patient is resting with call bell in reach got patient a warm blanket

## 2020-06-05 NOTE — ED Provider Notes (Signed)
[place] EMERGENCY DEPARTMENT Provider Note   CSN: [id number] Arrival date & time: 06/05/20  5631     History Chief Complaint  Patient presents with  . Altered Mental Status    Charles Schmidt is a 79 y.o. male.  79 yo M with a chief complaints of altered mental status.  The patient was found on the ground in the bathroom this morning.  He lives with friends and the reportedly heard him fall.  Not at his normal baseline per friends.  Not acting normally.  Started vomiting upon arrival to the ED.  The patient moans, he asked for a pillow.  Tells me that nothing hurts him.  Level 5 caveat altered mental status.  The history is provided by the patient.  Altered Mental Status Presenting symptoms: behavior changes and confusion   Severity:  Moderate Most recent episode:  Today Episode history:  Continuous Duration:  2 hours Timing:  Constant Progression:  Unchanged Chronicity:  New Associated symptoms: nausea and vomiting   Associated symptoms: no abdominal pain, no fever, no headaches, no palpitations and no rash        Past Medical History:  Diagnosis Date  . BPH with obstruction/lower urinary tract symptoms   . Cataract, mature    12-28-2017  per pt right eye, legally blind  . Diarrhea concurrent with and due to carcinoid syndrome ([name])    12-28-2017 improvement since started Sandostatin monthly  . ED (erectile dysfunction)   . Elevated PSA   . Family history of cancer in son   . Family history of prostate cancer   . Foley catheter in place   . Glaucoma, left eye   . History of colon polyps   . History of urinary retention    2015; 2016; 01/ 2019  . Hypertension   . Metastatic malignant neuroendocrine tumor to liver [facility]) oncoloigst-  dr Benay Spice   dx 10-23-2017 by liver bx-- WHO grade 2; small bowel mesenteric mass, cirrhosis, mets left lower lobe solitary nodule ,  periaortic and paraspinal lymph nodes METS--  started monthly Sandostatin  11-20-2017  . Microcytic anemia   . RBBB (right bundle branch block with left anterior fascicular block)   . Urinary retention   . Venous reflux     Patient Active Problem List   Diagnosis Date Noted  . Genetic testing 12/18/2017  . Family history of cancer in son   . Family history of prostate cancer   . Metastatic malignant neuroendocrine tumor to liver ([name]) 10/28/2017  . Essential hypertension (primary) 05/04/2017  . Hypokalemia 05/04/2017  . Benign prostatic hyperplasia 05/04/2017  . Glaucoma 05/04/2017  . Benign hypertensive heart disease without heart failure 05/04/2017  . Microcytic anemia 05/04/2017  . Prediabetes 05/04/2017  . Hypocalcemia 05/04/2017  . Psoriasis 05/04/2017    Past Surgical History:  Procedure Laterality Date  . INGUINAL HERNIA REPAIR  1990s   unsure side  . IR US GUIDE BX ASP/DRAIN  10/23/2017  . QUADRICEPS TENDON REPAIR Left 01-08-2011   dr Veverly Fells  . THULIUM LASER TURP (TRANSURETHRAL RESECTION OF PROSTATE) N/A 01/05/2018   Procedure: Marcelino Duster LASER OF THE PROSTATE;  Surgeon: Festus Aloe, MD;  Location: [facility];  Service: Urology;  Laterality: N/A;  . TONSILLECTOMY  child       Family History  Problem Relation Age of Onset  . Hypertension Mother   . Stroke Father   . Prostate cancer Father        'slow growing'  .  Kidney Stones Sister   . Cancer Son 27       neuroendocrine tumors of small intestine    Social History   Tobacco Use  . Smoking status: Never Smoker  . Smokeless tobacco: Never Used  Vaping Use  . Vaping Use: Never used  Substance Use Topics  . Alcohol use: Not Currently    Alcohol/week: 1.0 standard drink    Types: 1 Standard drinks or equivalent per week    Comment: week  . Drug use: No    Home Medications Prior to Admission medications   Medication Sig Start Date End Date Taking? Authorizing Provider  amLODipine (NORVASC) 5 MG tablet Take 1 tablet (5 mg total) by mouth at bedtime. 05/01/18   Yes Jacqualine Mau, NP  dorzolamide-timolol (COSOPT) 22.3-6.8 MG/ML ophthalmic solution Place 1 drop into the left eye 2 (two) times daily.    Yes [provider]  finasteride (PROSCAR) 5 MG tablet Take 5 mg by mouth at bedtime.    Yes [provider]  hydrochlorothiazide (HYDRODIURIL) 12.5 MG tablet Take 12.5 mg by mouth daily. 11/03/18  Yes [provider]  loperamide (IMODIUM) 2 MG capsule Take 2-4 mg by mouth 4 (four) times daily as needed for diarrhea or loose stools.    Yes [provider]  Multiple Vitamin (MULTIVITAMIN WITH MINERALS) TABS tablet Take 1 tablet by mouth at bedtime. Centrum Silver   Yes [provider]  potassium chloride (KLOR-CON) 10 MEQ tablet Take 20 mEq by mouth daily. 05/24/20  Yes [provider]  Saw Palmetto, Serenoa repens, (SAW PALMETTO PO) Take 2 tablets by mouth at bedtime.    Yes [provider]  tamsulosin (FLOMAX) 0.4 MG CAPS capsule Take 1 capsule (0.4 mg total) by mouth daily after supper. Patient taking differently: Take 0.4 mg by mouth at bedtime.  10/22/14  Yes [name], MD  Travoprost, BAK Free, (TRAVATAN) 0.004 % SOLN ophthalmic solution Place 1 drop into the left eye at bedtime.    Yes [provider]  HYDROcodone-acetaminophen (NORCO/VICODIN) 5-325 MG tablet Take 1 tablet by mouth every 6 (six) hours as needed. Patient not taking: Reported on 08/08/2019 04/02/19   Carmin Muskrat, MD    Allergies    Patient has no known allergies.  Review of Systems   Review of Systems  Constitutional: Negative for chills and fever.  HENT: Negative for congestion and facial swelling.   Eyes: Negative for discharge and visual disturbance.  Respiratory: Negative for shortness of breath.   Cardiovascular: Negative for chest pain and palpitations.  Gastrointestinal: Positive for nausea and vomiting. Negative for abdominal pain and diarrhea.  Musculoskeletal: Negative for arthralgias  and myalgias.  Skin: Negative for color change and rash.  Neurological: Negative for tremors, syncope and headaches.  Psychiatric/Behavioral: Positive for confusion. Negative for dysphoric mood.    Physical Exam Updated Vital Signs BP 124/80   Pulse 83   Temp 98 F (36.7 C) (Oral)   Resp 17   SpO2 95%   Physical Exam Vitals and nursing note reviewed.  Constitutional:      Appearance: He is well-developed.  HENT:     Head: Normocephalic and atraumatic.  Eyes:     Pupils: Pupils are equal, round, and reactive to light.  Neck:     Vascular: No JVD.  Cardiovascular:     Rate and Rhythm: Normal rate and regular rhythm.     Heart sounds: No murmur heard.  No friction rub. No gallop.  Pulmonary:     Effort: No respiratory distress.     Breath sounds: No wheezing.  Abdominal:     General: There is no distension.     Tenderness: There is no abdominal tenderness. There is no guarding or rebound.     Comments: No obvious tenderness on abdominal exam.  The patient's liver is palpated about 8 fingerbreadths below the right costal margin.  Musculoskeletal:        General: Normal range of motion.     Cervical back: Normal range of motion and neck supple.  Skin:    Coloration: Skin is not pale.     Findings: No rash.  Neurological:     Mental Status: He is alert and oriented to person, place, and time.  Psychiatric:        Behavior: Behavior normal.     ED Results / Procedures / Treatments   Labs (all labs ordered are listed, but only abnormal results are displayed) Labs Reviewed  AMMONIA - Abnormal; Notable for the following components:      Result Value   Ammonia 39 (*)    All other components within normal limits  COMPREHENSIVE METABOLIC PANEL - Abnormal; Notable for the following components:   CO2 20 (*)    Glucose, Bld 184 (*)    Calcium 8.5 (*)    Albumin 2.7 (*)    AST 60 (*)    Alkaline Phosphatase 333 (*)    GFR calc non Af Amer 57 (*)    All other  components within normal limits  CBC WITH DIFFERENTIAL/PLATELET - Abnormal; Notable for the following components:   Hemoglobin 12.2 (*)    HCT 37.2 (*)    MCV 79.5 (*)    RDW 19.9 (*)    All other components within normal limits  URINALYSIS, ROUTINE W REFLEX MICROSCOPIC - Abnormal; Notable for the following components:   APPearance HAZY (*)    Protein, ur 30 (*)    Leukocytes,Ua TRACE (*)    All other components within normal limits  LACTIC ACID, PLASMA - Abnormal; Notable for the following components:   Lactic Acid, Venous 5.8 (*)    All other components within normal limits  TSH - Abnormal; Notable for the following components:   TSH 7.871 (*)    All other components within normal limits  PROTIME-INR - Abnormal; Notable for the following components:   Prothrombin Time 15.9 (*)    INR 1.3 (*)    All other components within normal limits  T4, FREE - Abnormal; Notable for the following components:   Free T4 1.23 (*)    All other components within normal limits  LACTIC ACID, PLASMA - Abnormal; Notable for the following components:   Lactic Acid, Venous 4.4 (*)    All other components within normal limits  URINE CULTURE  ETHANOL  RAPID URINE DRUG SCREEN, HOSP PERFORMED  LIPASE, BLOOD  CK  CBG MONITORING, ED  TROPONIN I (HIGH SENSITIVITY)    EKG EKG Interpretation  Date/Time:  Tuesday June 05 2020 10:00:10 EDT Ventricular Rate:  72 PR Interval:    QRS Duration: 145 QT Interval:  456 QTC Calculation: 500 R Axis:   -61 Text Interpretation: Sinus rhythm Right bundle branch block Inferior infarct, old No significant change since last tracing Confirmed by Deno Etienne [phone]) on 06/05/2020 10:32:32 AM   Radiology CT HEAD WO CONTRAST  Result Date: 06/05/2020 CLINICAL DATA:  Delirium EXAM: CT HEAD WITHOUT CONTRAST TECHNIQUE: Contiguous axial images were obtained from  the base of the skull through the vertex without intravenous contrast. COMPARISON:  Head CT 01/20/2007  FINDINGS: Brain: No evidence of acute infarction, hemorrhage, hydrocephalus, extra-axial collection or mass lesion/mass effect. Cerebral volume loss since 2008, but age congruent. Vascular: No hyperdense vessel or unexpected calcification. Skull: Normal. Negative for fracture or focal lesion. Sinuses/Orbits: Negative IMPRESSION: Age normal head CT. Electronically Signed   By: Monte Fantasia M.D.   On: 06/05/2020 10:40   CT ABDOMEN PELVIS W CONTRAST  Result Date: 06/05/2020 CLINICAL DATA:  Nausea, vomiting.  Metastatic neuroendocrine tumor. EXAM: CT ABDOMEN AND PELVIS WITH CONTRAST TECHNIQUE: Multidetector CT imaging of the abdomen and pelvis was performed using the standard protocol following bolus administration of intravenous contrast. CONTRAST:  160mL OMNIPAQUE IOHEXOL 300 MG/ML  SOLN COMPARISON:  PET CT 10/19/2019 FINDINGS: Lower chest: heart is normal size. Small pleural base nodule in the left lower chest is stable since prior study. Hepatobiliary: Extensive metastatic disease throughout the liver, similar to prior study. Gallbladder unremarkable. Pancreas: No focal abnormality or ductal dilatation. Spleen: No focal abnormality.  Normal size. Adrenals/Urinary Tract: Left upper pole renal cyst. No hydronephrosis. Adrenal glands unremarkable. Large diverticulum off the dome of the bladder measuring up to 8 cm. Other smaller diverticula noted. Stomach/Bowel: Mild bowel wall thickening noted in the cecum and ascending colon. Several loops of thick-walled small bowel centrally in the abdomen adjacent to the spiculated mesenteric mass. No evidence of bowel obstruction. Vascular/Lymphatic: No evidence of aneurysm or adenopathy. Reproductive: Prostate enlargement. Other: Small amount of free fluid in the abdomen and pelvis. No free air. Spiculated central mesenteric mass noted measuring up to 4.4 cm compared with 3.7 cm previously. Musculoskeletal: No acute bony abnormality. IMPRESSION: Central mesenteric mass  slightly larger on today's study when compared to prior PET CT, 4.4 cm compared to 3.7 cm previously. Extensive hepatic metastatic disease not significantly changed. Bowel wall thickening within central small bowel loops and right colon. Cannot exclude enteritis/colitis. Prostate enlargement with numerous bladder wall diverticula. Small amount of free fluid in the abdomen and pelvis. Stable small pleural nodule in the left lower lung. Electronically Signed   By: Rolm Baptise M.D.   On: 06/05/2020 14:03   DG Chest Port 1 View  Result Date: 06/05/2020 CLINICAL DATA:  Altered mental status EXAM: PORTABLE CHEST 1 VIEW COMPARISON:  08/22/2014 FINDINGS: Heart and mediastinal contours are within normal limits. No focal opacities or effusions. No acute bony abnormality. IMPRESSION: No active disease. Electronically Signed   By: Rolm Baptise M.D.   On: 06/05/2020 10:30    Procedures Procedures (including critical care time)  Medications Ordered in ED Medications  Ampicillin-Sulbactam (UNASYN) 3 g in sodium chloride 0.9 % 100 mL IVPB (3 g Intravenous New Bag/Given 06/05/20 1510)  lactated ringers bolus 1,000 mL (has no administration in time range)  sodium chloride 0.9 % bolus 1,000 mL (0 mLs Intravenous Stopped 06/05/20 1415)  ondansetron (ZOFRAN) injection 4 mg (4 mg Intravenous Given 06/05/20 1002)  ondansetron (ZOFRAN) injection 4 mg (4 mg Intravenous Given 06/05/20 1235)  iohexol (OMNIPAQUE) 300 MG/ML solution 100 mL (100 mLs Intravenous Contrast Given 06/05/20 1346)  prochlorperazine (COMPAZINE) injection 5 mg (5 mg Intravenous Given 06/05/20 1501)  diphenhydrAMINE (BENADRYL) injection 12.5 mg (12.5 mg Intravenous Given 06/05/20 1502)    ED Course  I have reviewed the triage vital signs and the nursing notes.  Pertinent labs & imaging results that were available during my care of the patient were reviewed by me and considered in  my medical decision making (see chart for details).    MDM  Rules/Calculators/A&P                          79 yo M with a chief complaints of altered mental status.  The patient reportedly fell this morning and was acting different than his baseline when his friends found him in the bathroom.  Will communicate, but has limited response.  Moving all 4 extremities.  Obtain a CT scan of the head lab work.  His records were reviewed and he unfortunately has a history of metastatic liver cancer.  We will add ammonia and INR.  Patient feeling better on reassessment and is much more cognizant.  States that he just felt like he needed to throw up earlier and now feels better.  His lactic acid level however is 5.8.  He had another episode of vomiting while attempting a p.o. challenge.  Will obtain a CT scan of the abdomen pelvis.  Fluid bolus.  Recheck lactate.  Lactate improving.   Discussed with family med for admission.   The patient is noted to have a lactate>4. With the current information available to me, I don't think the patient is in septic shock. The lactate>4, is related to liver dysfunction from liver ca.  CRITICAL CARE Performed by: Cecilio Asper   Total critical care time: 35 minutes  Critical care time was exclusive of separately billable procedures and treating other patients.  Critical care was necessary to treat or prevent imminent or life-threatening deterioration.  Critical care was time spent personally by me on the following activities: development of treatment plan with patient and/or surrogate as well as nursing, discussions with consultants, evaluation of patient's response to treatment, examination of patient, obtaining history from patient or surrogate, ordering and performing treatments and interventions, ordering and review of laboratory studies, ordering and review of radiographic studies, pulse oximetry and re-evaluation of patient's condition.   The patients results and plan were reviewed and discussed.   Any x-rays  performed were independently reviewed by myself.   Differential diagnosis were considered with the presenting HPI.  Medications  Ampicillin-Sulbactam (UNASYN) 3 g in sodium chloride 0.9 % 100 mL IVPB (3 g Intravenous New Bag/Given 06/05/20 1510)  lactated ringers bolus 1,000 mL (has no administration in time range)  sodium chloride 0.9 % bolus 1,000 mL (0 mLs Intravenous Stopped 06/05/20 1415)  ondansetron (ZOFRAN) injection 4 mg (4 mg Intravenous Given 06/05/20 1002)  ondansetron (ZOFRAN) injection 4 mg (4 mg Intravenous Given 06/05/20 1235)  iohexol (OMNIPAQUE) 300 MG/ML solution 100 mL (100 mLs Intravenous Contrast Given 06/05/20 1346)  prochlorperazine (COMPAZINE) injection 5 mg (5 mg Intravenous Given 06/05/20 1501)  diphenhydrAMINE (BENADRYL) injection 12.5 mg (12.5 mg Intravenous Given 06/05/20 1502)    Vitals:   06/05/20 1245 06/05/20 1300 06/05/20 1315 06/05/20 1430  BP: 135/90 (!) 139/95 117/83 124/80  Pulse: 80 84 77 83  Resp: 15 17  17   Temp:      TempSrc:      SpO2: 98% 98% 93% 95%    Final diagnoses:  Colitis  Nausea and vomiting in adult    Admission/ observation were discussed with the admitting physician, patient and/or family and they are comfortable with the plan.   Final Clinical Impression(s) / ED Diagnoses Final diagnoses:  Colitis  Nausea and vomiting in adult    Rx / DC Orders ED Discharge Orders    None  Deno Etienne, DO 06/05/20 1511

## 2020-06-05 NOTE — ED Triage Notes (Signed)
Pt bib ems from home with reports of altered mental status. Per EMS, friends heard pt fall in the bathroom. Found pt on the floor, incontinent of stool. Per friends, pt is not acting like himself. Pt has been moaning non stop, but alert and oriented X4. Initial BP 80 systolic, given 272ZD NS en route. On arrival to ED, BP 104/60. HR 70's. CBG 177.

## 2020-06-06 ENCOUNTER — Other Ambulatory Visit (HOSPITAL_COMMUNITY): Payer: Medicare Other

## 2020-06-06 ENCOUNTER — Telehealth: Payer: Self-pay | Admitting: *Deleted

## 2020-06-06 ENCOUNTER — Other Ambulatory Visit: Payer: Self-pay

## 2020-06-06 ENCOUNTER — Inpatient Hospital Stay (HOSPITAL_COMMUNITY): Payer: Medicare Other

## 2020-06-06 ENCOUNTER — Encounter (HOSPITAL_COMMUNITY): Payer: Self-pay | Admitting: Family Medicine

## 2020-06-06 DIAGNOSIS — C778 Secondary and unspecified malignant neoplasm of lymph nodes of multiple regions: Secondary | ICD-10-CM

## 2020-06-06 DIAGNOSIS — R110 Nausea: Secondary | ICD-10-CM

## 2020-06-06 DIAGNOSIS — C7A8 Other malignant neuroendocrine tumors: Secondary | ICD-10-CM

## 2020-06-06 DIAGNOSIS — R7401 Elevation of levels of liver transaminase levels: Secondary | ICD-10-CM

## 2020-06-06 DIAGNOSIS — R7989 Other specified abnormal findings of blood chemistry: Secondary | ICD-10-CM

## 2020-06-06 DIAGNOSIS — Z9181 History of falling: Secondary | ICD-10-CM

## 2020-06-06 DIAGNOSIS — C787 Secondary malignant neoplasm of liver and intrahepatic bile duct: Secondary | ICD-10-CM

## 2020-06-06 DIAGNOSIS — K529 Noninfective gastroenteritis and colitis, unspecified: Secondary | ICD-10-CM

## 2020-06-06 DIAGNOSIS — N323 Diverticulum of bladder: Secondary | ICD-10-CM

## 2020-06-06 DIAGNOSIS — R5381 Other malaise: Secondary | ICD-10-CM

## 2020-06-06 DIAGNOSIS — K639 Disease of intestine, unspecified: Secondary | ICD-10-CM | POA: Diagnosis present

## 2020-06-06 DIAGNOSIS — E860 Dehydration: Secondary | ICD-10-CM

## 2020-06-06 DIAGNOSIS — C7B8 Other secondary neuroendocrine tumors: Secondary | ICD-10-CM

## 2020-06-06 DIAGNOSIS — R197 Diarrhea, unspecified: Secondary | ICD-10-CM

## 2020-06-06 DIAGNOSIS — R748 Abnormal levels of other serum enzymes: Secondary | ICD-10-CM | POA: Insufficient documentation

## 2020-06-06 DIAGNOSIS — C7801 Secondary malignant neoplasm of right lung: Secondary | ICD-10-CM

## 2020-06-06 DIAGNOSIS — R627 Adult failure to thrive: Secondary | ICD-10-CM

## 2020-06-06 DIAGNOSIS — R109 Unspecified abdominal pain: Secondary | ICD-10-CM

## 2020-06-06 DIAGNOSIS — R630 Anorexia: Secondary | ICD-10-CM

## 2020-06-06 DIAGNOSIS — R7402 Elevation of levels of lactic acid dehydrogenase (LDH): Secondary | ICD-10-CM

## 2020-06-06 DIAGNOSIS — Z602 Problems related to living alone: Secondary | ICD-10-CM

## 2020-06-06 DIAGNOSIS — R1115 Cyclical vomiting syndrome unrelated to migraine: Secondary | ICD-10-CM | POA: Diagnosis present

## 2020-06-06 DIAGNOSIS — R112 Nausea with vomiting, unspecified: Secondary | ICD-10-CM | POA: Insufficient documentation

## 2020-06-06 HISTORY — DX: Diverticulum of bladder: N32.3

## 2020-06-06 LAB — CBC WITH DIFFERENTIAL/PLATELET
Abs Immature Granulocytes: 0.01 10*3/uL (ref 0.00–0.07)
Basophils Absolute: 0 10*3/uL (ref 0.0–0.1)
Basophils Relative: 0 %
Eosinophils Absolute: 0 10*3/uL (ref 0.0–0.5)
Eosinophils Relative: 0 %
HCT: 38.2 % — ABNORMAL LOW (ref 39.0–52.0)
Hemoglobin: 12.8 g/dL — ABNORMAL LOW (ref 13.0–17.0)
Immature Granulocytes: 0 %
Lymphocytes Relative: 8 %
Lymphs Abs: 0.4 10*3/uL — ABNORMAL LOW (ref 0.7–4.0)
MCH: 25.7 pg — ABNORMAL LOW (ref 26.0–34.0)
MCHC: 33.5 g/dL (ref 30.0–36.0)
MCV: 76.6 fL — ABNORMAL LOW (ref 80.0–100.0)
Monocytes Absolute: 0.5 10*3/uL (ref 0.1–1.0)
Monocytes Relative: 8 %
Neutro Abs: 4.7 10*3/uL (ref 1.7–7.7)
Neutrophils Relative %: 84 %
Platelets: 168 10*3/uL (ref 150–400)
RBC: 4.99 MIL/uL (ref 4.22–5.81)
RDW: 19.9 % — ABNORMAL HIGH (ref 11.5–15.5)
WBC: 5.6 10*3/uL (ref 4.0–10.5)
nRBC: 0 % (ref 0.0–0.2)

## 2020-06-06 LAB — COMPREHENSIVE METABOLIC PANEL
ALT: 130 U/L — ABNORMAL HIGH (ref 0–44)
AST: 3178 U/L — ABNORMAL HIGH (ref 15–41)
Albumin: 2.6 g/dL — ABNORMAL LOW (ref 3.5–5.0)
Alkaline Phosphatase: 398 U/L — ABNORMAL HIGH (ref 38–126)
Anion gap: 12 (ref 5–15)
BUN: 12 mg/dL (ref 8–23)
CO2: 21 mmol/L — ABNORMAL LOW (ref 22–32)
Calcium: 8.6 mg/dL — ABNORMAL LOW (ref 8.9–10.3)
Chloride: 108 mmol/L (ref 98–111)
Creatinine, Ser: 1.09 mg/dL (ref 0.61–1.24)
GFR calc Af Amer: >60 mL/min (ref >60)
GFR calc non Af Amer: >60 mL/min (ref >60)
Glucose, Bld: 111 mg/dL — ABNORMAL HIGH (ref 70–99)
Potassium: 4.7 mmol/L (ref 3.5–5.1)
Sodium: 141 mmol/L (ref 135–145)
Total Bilirubin: 2.4 mg/dL — ABNORMAL HIGH (ref 0.3–1.2)
Total Protein: 7.2 g/dL (ref 6.5–8.1)

## 2020-06-06 LAB — HEPATIC FUNCTION PANEL
ALT: 129 U/L — ABNORMAL HIGH (ref 0–44)
AST: 3001 U/L — ABNORMAL HIGH (ref 15–41)
Albumin: 2.6 g/dL — ABNORMAL LOW (ref 3.5–5.0)
Alkaline Phosphatase: 396 U/L — ABNORMAL HIGH (ref 38–126)
Bilirubin, Direct: 1 mg/dL — ABNORMAL HIGH (ref 0.0–0.2)
Indirect Bilirubin: 1.5 mg/dL — ABNORMAL HIGH (ref 0.3–0.9)
Total Bilirubin: 2.5 mg/dL — ABNORMAL HIGH (ref 0.3–1.2)
Total Protein: 7.2 g/dL (ref 6.5–8.1)

## 2020-06-06 LAB — GASTROINTESTINAL PANEL BY PCR, STOOL (REPLACES STOOL CULTURE)

## 2020-06-06 LAB — HEPATITIS A ANTIBODY, IGM: Hep A IgM: NONREACTIVE

## 2020-06-06 LAB — LACTIC ACID, PLASMA
Lactic Acid, Venous: 3.9 mmol/L (ref 0.5–1.9)
Lactic Acid, Venous: 4 mmol/L (ref 0.5–1.9)

## 2020-06-06 LAB — HEPATITIS B SURFACE ANTIGEN: Hepatitis B Surface Ag: NONREACTIVE

## 2020-06-06 LAB — GAMMA GT: GGT: 134 U/L — ABNORMAL HIGH (ref 7–50)

## 2020-06-06 LAB — ACETAMINOPHEN LEVEL: Acetaminophen (Tylenol), Serum: <10 ug/mL — ABNORMAL LOW (ref 10–30)

## 2020-06-06 LAB — HEPATITIS C ANTIBODY: HCV Ab: NONREACTIVE

## 2020-06-06 MED ORDER — SODIUM CHLORIDE 0.9 % IV BOLUS
1000.0000 mL | Freq: Once | INTRAVENOUS | Status: AC
  Administered 2020-06-06: 1000 mL via INTRAVENOUS

## 2020-06-06 MED ORDER — IOHEXOL 350 MG/ML SOLN
100.0000 mL | Freq: Once | INTRAVENOUS | Status: AC | PRN
  Administered 2020-06-06: 100 mL via INTRAVENOUS

## 2020-06-06 NOTE — ED Notes (Signed)
[facility] called to notify that Stool sample is too firm to perform C-Diff test

## 2020-06-06 NOTE — ED Notes (Signed)
Patient transported to CT 

## 2020-06-06 NOTE — Progress Notes (Signed)
New Admission Note:   Arrival Method:  Stretcher Mental Orientation: alertx3 Telemetry:  none Assessment: see flowsheet Skin: see flow sheet IV: NSL Pain:  none Tubes: none Safety Measures: Safety Fall Prevention Plan has been discussed Admission: Completed [facility] Orientation: Patient has been orientated to the room, unit and staff.  Family: none at bedside  Orders have been reviewed and implemented. Will continue to monitor the patient. Call light has been placed within reach and bed alarm has been activated.   Rockie Neighbours BSN, RN Phone number: [phone]

## 2020-06-06 NOTE — ED Notes (Signed)
Condom cath placed, sheets changed, diaper changed and patient cleaned up

## 2020-06-06 NOTE — Progress Notes (Addendum)
IP PROGRESS NOTE   Subjective:   Mr. Doubek is well-known to me with a history of metastatic carcinoid tumor, currently being treated with monthly Sandostatin, last given on 05/22/2020.  He presented to the emergency room yesterday after being found on his bathroom floor.  He reports that he felt too weak to get up.  He he has noted increased diarrhea for the past month with limited oral intake.  He has episodes of vomiting.  He reports having approximately 10 diarrhea stools per day.  He reports abdominal pain.  Objective: Vital signs in last 24 hours: Blood pressure 134/88, pulse 88, temperature 97.9 F (36.6 C), resp. rate 19, SpO2 98 %.  Intake/Output from previous day: 09/14 0701 - 09/15 0700 In: 800 [I.V.:800] Out: 100 [Urine:100]  Physical Exam:  HEENT: No thrush Lungs: Clear bilaterally Cardiac: Regular rate and rhythm Abdomen: Marked hepatomegaly extending into the left abdomen Extremities: No leg edema Neurologic: Alert, follows commands    Lab Results: Recent Labs    06/05/20 0957 06/06/20 0537  WBC 4.0 5.6  HGB 12.2* 12.8*  HCT 37.2* 38.2*  PLT 198 168    BMET Recent Labs    06/05/20 0957 06/06/20 0537  NA 141 141  K 3.6 4.7  CL 106 108  CO2 20* 21*  GLUCOSE 184* 111*  BUN 10 12  CREATININE 1.20 1.09  CALCIUM 8.5* 8.6*    No results found for: CEA1  Studies/Results: CT HEAD WO CONTRAST  Result Date: 06/05/2020 CLINICAL DATA:  Delirium EXAM: CT HEAD WITHOUT CONTRAST TECHNIQUE: Contiguous axial images were obtained from the base of the skull through the vertex without intravenous contrast. COMPARISON:  Head CT 01/20/2007 FINDINGS: Brain: No evidence of acute infarction, hemorrhage, hydrocephalus, extra-axial collection or mass lesion/mass effect. Cerebral volume loss since 2008, but age congruent. Vascular: No hyperdense vessel or unexpected calcification. Skull: Normal. Negative for fracture or focal lesion. Sinuses/Orbits: Negative IMPRESSION:  Age normal head CT. Electronically Signed   By: Monte Fantasia M.D.   On: 06/05/2020 10:40   CT ABDOMEN PELVIS W CONTRAST  Result Date: 06/05/2020 CLINICAL DATA:  Nausea, vomiting.  Metastatic neuroendocrine tumor. EXAM: CT ABDOMEN AND PELVIS WITH CONTRAST TECHNIQUE: Multidetector CT imaging of the abdomen and pelvis was performed using the standard protocol following bolus administration of intravenous contrast. CONTRAST:  158m OMNIPAQUE IOHEXOL 300 MG/ML  SOLN COMPARISON:  PET CT 10/19/2019 FINDINGS: Lower chest: heart is normal size. Small pleural base nodule in the left lower chest is stable since prior study. Hepatobiliary: Extensive metastatic disease throughout the liver, similar to prior study. Gallbladder unremarkable. Pancreas: No focal abnormality or ductal dilatation. Spleen: No focal abnormality.  Normal size. Adrenals/Urinary Tract: Left upper pole renal cyst. No hydronephrosis. Adrenal glands unremarkable. Large diverticulum off the dome of the bladder measuring up to 8 cm. Other smaller diverticula noted. Stomach/Bowel: Mild bowel wall thickening noted in the cecum and ascending colon. Several loops of thick-walled small bowel centrally in the abdomen adjacent to the spiculated mesenteric mass. No evidence of bowel obstruction. Vascular/Lymphatic: No evidence of aneurysm or adenopathy. Reproductive: Prostate enlargement. Other: Small amount of free fluid in the abdomen and pelvis. No free air. Spiculated central mesenteric mass noted measuring up to 4.4 cm compared with 3.7 cm previously. Musculoskeletal: No acute bony abnormality. IMPRESSION: Central mesenteric mass slightly larger on today's study when compared to prior PET CT, 4.4 cm compared to 3.7 cm previously. Extensive hepatic metastatic disease not significantly changed. Bowel wall thickening within central small  bowel loops and right colon. Cannot exclude enteritis/colitis. Prostate enlargement with numerous bladder wall diverticula.  Small amount of free fluid in the abdomen and pelvis. Stable small pleural nodule in the left lower lung. Electronically Signed   By: Rolm Baptise M.D.   On: 06/05/2020 14:03   DG Chest Port 1 View  Result Date: 06/05/2020 CLINICAL DATA:  Altered mental status EXAM: PORTABLE CHEST 1 VIEW COMPARISON:  08/22/2014 FINDINGS: Heart and mediastinal contours are within normal limits. No focal opacities or effusions. No acute bony abnormality. IMPRESSION: No active disease. Electronically Signed   By: Rolm Baptise M.D.   On: 06/05/2020 10:30   CT images reviewed  Medications: I have reviewed the patient's current medications.  Assessment/Plan: 1.Metastatic carcinoid tumor, biopsy of a liver lesion 10/23/2017 consistent with a well-differentiated neuroendocrine neoplasm, WHO grade 2,[id number]%  CT abdomen/pelvis 10/13/2017-extensive liver metastases, cirrhosis, soft tissue mass in the small bowel mesentery versus a primary small bowel tumor, tiny lucent lesions in the pelvic bones  Elevated chromogranin A and 24-hour urine 5-HIAA  CT chest 11/24/2017-subpleural nodule in the left lower lobe with associated radiotracer activity on comparison DOTATATEPET scan. No additional evidence of thoracic metastasis.  DOTATATE PET scan3/01/2018-intense radiotracer accumulation with innumerable confluent hepatic metastasis; intense radiotracer activity within central mesenteric mass; 2 foci of uptake associated with the small bowel; intense radiotracer activity associated periaortic and paraspinal lymph nodes; more distant solitary small metastasis within the pleural space left lower lobe.  Monthly Sandostatin initiated 11/26/2017, dose increased to 30 mg 01/21/2018  Cycle 1 Lutathera 03/03/2018, monthly Sandostatin continued  Cycle 2 Lutathera 04/27/2018, monthly Sandostatin continued  Cycle 3 Lutathera 06/23/2018  Cycle 4 Lutathera 08/18/2018  Restaging dotatate PET scan 09/29/2018-multiple lesions again demonstrated  within the liver which accumulate the radiotracer. The number of lesions which accumulate the tracer has decreased visually compared to the prior exam. Some lesions are no longer evident along the right hepatic margin. Remaining measurable lesions have radiotracer activity similar to prior. No new lesions present. Central mesenteric mass unchanged and remains avid for radiotracer. Adjacent lesion within the small bowel SUV max equal 10.5 compared to SUV max equal 15.8. Smaller lesion previously identified in the upper pelvis small bowel appears decreased in activity. No new metastatic lesions. Lesion position between the right psoas muscle and spine SUV max equal to 21.8 compared with SUV max equal 26.2.  Dotatate PET 10/19/2019-no evidence of disease progression, stable multifocal hepatic metastases, unchanged from post therapy scan 03/30/2019 and improved from pretherapy scan5/01/2018. Stable mesenteric mass, improved retroperitoneal lymph node adjacent to the celiac trunk, solitary small bowel lesion unchanged  Monthly Sandostatin continued  01/26/2020 chromogranin A level stable elevation  CT abdomen/pelvis 06/05/2020-slight enlargement of central mesenteric mass, extensive hepatic metastases-unchanged, bowel wall thickening in central small bowel loops and right colon  2.Diarrhea-likely secondary to carcinoid syndrome, improved with Sandostatin 3.History of anorexia/weight loss-improved 4.Prostatic hypertrophy;status post laser vaporization 01/05/2018 5. Mild pancytopenia following Lutathera treatment 6.Thoracic dermatomal zoster rash 04/02/2019 treated with Valtrex 7.  Admission 06/05/2020 after a fall with failure to thrive, increased diarrhea, nausea, and abdominal pain 8.  Markedly elevated liver enzymes/bilirubin secondary to the metastatic tumor burden and acute liver insult?  Mr. Daws has advanced metastatic carcinoid tumor with extensive liver metastases.  He is  admitted after a fall with generalized weakness, nausea, and increased diarrhea.  He has abdominal pain.  I suspect his symptoms are related to progression of the carcinoid tumor/syndrome.  He has been maintained on  Sandostatin since March 2019 with initial clinical improvement.  He was treated with Lutathera beginning in June 2019.  It is possible the bowel thickening on CT and diarrhea are related to another inflammatory condition or infection, but I think this is less likely.  We will need to consider changing to a different somatostatin analog, lanreotide, and repeat treatment with Lutathera if his performance status improves.  We will also need to have discussions regarding goals of care.  He will be a hospice candidate if his condition does not improve with control of the diarrhea.  Recommendations: 1.  Check stool for C. Difficile 2.  Begin Imodium and Lomotil for diarrhea if the C. difficile returns negative 3.  Can add octreotide for diarrhea if not controlled with Imodium and Lomotil 4.  Consider switch to long-acting lanreotide and repeat treatment with Lutathera as an outpatient 5.  Narcotic analgesics as needed for pain 6.  Follow liver enzymes 7.  I will follow Mr. Mikkelsen in the hospital and schedule outpatient follow-up at the Cancer center.  Please call me with any question regarding his history of carcinoid disease.     LOS: 1 day   Betsy Coder, MD   06/06/2020, 8:11 AM

## 2020-06-06 NOTE — ED Notes (Signed)
Pt moved to hospital bed for comfort. Continues to vomit. Admitting paged for new orders

## 2020-06-06 NOTE — Consult Note (Signed)
[facility] Surgery Consult Note  Charles Schmidt 31-Jul-1941  [id number].    Requesting MD: Chambliss Chief Complaint/Reason for Consult: concern for bowel ischemia HPI:  Patient is a 79 year old male who presented to [facility] yesterday s/p fall in his bathroom. Patient reported increasing weakness over the last 30 days but family reported more acute change over the last 1-3 days. Patient has been having n/v/d.  Initial workup showed what appeared to be some sort of enterocolitis and known chronic mesenteric carcinoid tumor with known extensive liver mets. Today patient had significantly increased LFTs and primary team was concerned for possible bowel ischemia. CTA has been ordered and patient was being collected for this as I was seeing him. Patient reported to me that he "feels better" and denied abdominal pain at this time.   ROS: Review of Systems  Constitutional: Positive for malaise/fatigue.  Respiratory: Negative for shortness of breath and wheezing.   Cardiovascular: Negative for chest pain and palpitations.  Gastrointestinal: Negative for abdominal pain, nausea and vomiting.  Genitourinary: Negative for dysuria, frequency and urgency.  Neurological: Positive for weakness.  All other systems reviewed and are negative.   Family History  Problem Relation Age of Onset  . Hypertension Mother   . Stroke Father   . Prostate cancer Father        'slow growing'  . Kidney Stones Sister   . Cancer Son 63       neuroendocrine tumors of small intestine    Past Medical History:  Diagnosis Date  . Bladder diverticulum 06/06/2020  . BPH with obstruction/lower urinary tract symptoms   . Cataract, mature    12-28-2017  per pt right eye, legally blind  . Diarrhea concurrent with and due to carcinoid syndrome ([place])    12-28-2017 improvement since started Sandostatin monthly  . ED (erectile dysfunction)   . Elevated PSA   . Family history of cancer in son   . Family history of prostate  cancer   . Foley catheter in place   . Glaucoma, left eye   . History of colon polyps   . History of urinary retention    2015; 2016; 01/ 2019  . Hypertension   . Metastatic malignant neuroendocrine tumor to liver [facility]) oncoloigst-  dr Benay Spice   dx 10-23-2017 by liver bx-- WHO grade 2; small bowel mesenteric mass, cirrhosis, mets left lower lobe solitary nodule ,  periaortic and paraspinal lymph nodes METS--  started monthly Sandostatin 11-20-2017  . Microcytic anemia   . RBBB (right bundle branch block with left anterior fascicular block)   . Urinary retention   . Venous reflux     Past Surgical History:  Procedure Laterality Date  . INGUINAL HERNIA REPAIR  1990s   unsure side  . IR US GUIDE BX ASP/DRAIN  10/23/2017  . QUADRICEPS TENDON REPAIR Left 01-08-2011   dr Veverly Fells  . THULIUM LASER TURP (TRANSURETHRAL RESECTION OF PROSTATE) N/A 01/05/2018   Procedure: Marcelino Duster LASER OF THE PROSTATE;  Surgeon: Festus Aloe, MD;  Location: [facility];  Service: Urology;  Laterality: N/A;  . TONSILLECTOMY  child    Social History:  reports that he has never smoked. He has never used smokeless tobacco. He reports previous alcohol use of about 1.0 standard drink of alcohol per week. He reports that he does not use drugs.  Allergies: No Known Allergies  (Not in a hospital admission)   Blood pressure 134/85, pulse (!) 118, temperature 97.7 F (36.5 C), temperature source  Oral, resp. rate 18, SpO2 95 %. Physical Exam:  General: WD, chronically ill appearing male who is laying in bed in NAD HEENT: head is normocephalic, atraumatic.  Sclera are mildly icteric.  PERRL.  Ears and nose without any masses or lesions.  Mouth is pink and moist Heart: regular, rate, and rhythm. Palpable radial and pedal pulses bilaterally Lungs: CTAB, no wheezes, rhonchi, or rales noted.  Respiratory effort nonlabored Abd: soft, NT, distended MS: all 4 extremities are symmetrical with no cyanosis,  clubbing, or edema. Skin: warm and dry with no masses, lesions, or rashes Psych: patient mildly confused but following commands   Results for orders placed or performed during the hospital encounter of 06/05/20 (from the past 48 hour(s))  Comprehensive metabolic panel     Status: Abnormal   Collection Time: 06/05/20  9:57 AM  Result Value Ref Range   Sodium 141 135 - 145 mmol/L   Potassium 3.6 3.5 - 5.1 mmol/L   Chloride 106 98 - 111 mmol/L   CO2 20 (L) 22 - 32 mmol/L   Glucose, Bld 184 (H) 70 - 99 mg/dL    Comment: Glucose reference range applies only to samples taken after fasting for at least 8 hours.   BUN 10 8 - 23 mg/dL   Creatinine, Ser 1.20 0.61 - 1.24 mg/dL   Calcium 8.5 (L) 8.9 - 10.3 mg/dL   Total Protein 7.0 6.5 - 8.1 g/dL   Albumin 2.7 (L) 3.5 - 5.0 g/dL   AST 60 (H) 15 - 41 U/L   ALT 34 0 - 44 U/L   Alkaline Phosphatase 333 (H) 38 - 126 U/L   Total Bilirubin 1.2 0.3 - 1.2 mg/dL   GFR calc non Af Amer 57 (L) >60 mL/min   GFR calc Af Amer >60 >60 mL/min   Anion gap 15 5 - 15    Comment: Performed at [facility] [address], [place], [place] [address]  Ethanol     Status: None   Collection Time: 06/05/20  9:57 AM  Result Value Ref Range   Alcohol, Ethyl (B) <10 <10 mg/dL    Comment: (NOTE) Lowest detectable limit for serum alcohol is 10 mg/dL.  For medical purposes only. Performed at [facility], [place] [address], [place], [place] [address]   CBC WITH DIFFERENTIAL     Status: Abnormal   Collection Time: 06/05/20  9:57 AM  Result Value Ref Range   WBC 4.0 4.0 - 10.5 K/uL   RBC 4.68 4.22 - 5.81 MIL/uL   Hemoglobin 12.2 (L) 13.0 - 17.0 g/dL   HCT 37.2 (L) 39 - 52 %   MCV 79.5 (L) 80.0 - 100.0 fL   MCH 26.1 26.0 - 34.0 pg   MCHC 32.8 30.0 - 36.0 g/dL   RDW 19.9 (H) 11.5 - 15.5 %   Platelets 198 150 - 400 K/uL   nRBC 0.0 0.0 - 0.2 %   Neutrophils Relative % 53 %   Neutro Abs 2.2 1.7 - 7.7 K/uL   Lymphocytes Relative 40 %   Lymphs Abs 1.6 0.7  - 4.0 K/uL   Monocytes Relative 6 %   Monocytes Absolute 0.2 0 - 1 K/uL   Eosinophils Relative 1 %   Eosinophils Absolute 0.0 0 - 0 K/uL   Basophils Relative 0 %   Basophils Absolute 0.0 0 - 0 K/uL   Immature Granulocytes 0 %   Abs Immature Granulocytes 0.01 0.00 - 0.07 K/uL  Comment: Performed at [facility], [place] [address], [place], [place] [address]  Lactic acid, plasma     Status: Abnormal   Collection Time: 06/05/20  9:57 AM  Result Value Ref Range   Lactic Acid, Venous 5.8 (HH) 0.5 - 1.9 mmol/L    Comment: CRITICAL RESULT CALLED TO, READ BACK BY AND VERIFIED WITH: CRYSTAL BAIN,RN AT 1150 06/05/2020 BY ZBEECH. Performed at [facility], [place] [address], [place], [place] [address]   Lipase, blood     Status: None   Collection Time: 06/05/20  9:57 AM  Result Value Ref Range   Lipase 16 11 - 51 U/L    Comment: Performed at [facility] [address], [place], [place] [address]  Troponin I (High Sensitivity)     Status: None   Collection Time: 06/05/20  9:57 AM  Result Value Ref Range   Troponin I (High Sensitivity) 10 <18 ng/L    Comment: (NOTE) Elevated high sensitivity troponin I (hsTnI) values and significant  changes across serial measurements may suggest ACS but many other  chronic and acute conditions are known to elevate hsTnI results.  Refer to the "Links" section for chest pain algorithms and additional  guidance. Performed at [facility], [place] [address], [place], [place] [address]   CK     Status: None   Collection Time: 06/05/20  9:57 AM  Result Value Ref Range   Total CK 147 49.0 - 397.0 U/L    Comment: Performed at [facility], [place] [address], [place], [place] [address]  Protime-INR     Status: Abnormal   Collection Time: 06/05/20  9:57 AM  Result Value Ref Range   Prothrombin Time 15.9 (H) 11.4 - 15.2 seconds   INR 1.3 (H) 0.8 - 1.2    Comment: (NOTE) INR goal varies based on device and disease states. Performed at  [facility], [place] [address], [place], [place] [address]   TSH     Status: Abnormal   Collection Time: 06/05/20  9:59 AM  Result Value Ref Range   TSH 7.871 (H) 0.350 - 4.500 uIU/mL    Comment: Performed by a 3rd Generation assay with a functional sensitivity of <=0.01 uIU/mL. Performed at [facility], [place] [address], [place], [place] [address]   Urinalysis, Routine w reflex microscopic Urine, Clean Catch     Status: Abnormal   Collection Time: 06/05/20 10:32 AM  Result Value Ref Range   Color, Urine YELLOW YELLOW   APPearance HAZY (A) CLEAR   Specific Gravity, Urine 1.016 1.005 - 1.030   pH 6.0 5.0 - 8.0   Glucose, UA NEGATIVE NEGATIVE mg/dL   Hgb urine dipstick NEGATIVE NEGATIVE   Bilirubin Urine NEGATIVE NEGATIVE   Ketones, ur NEGATIVE NEGATIVE mg/dL   Protein, ur 30 (A) NEGATIVE mg/dL   Nitrite NEGATIVE NEGATIVE   Leukocytes,Ua TRACE (A) NEGATIVE   RBC / HPF 0-5 0 - 5 RBC/hpf   WBC, UA 21-50 0 - 5 WBC/hpf   Bacteria, UA NONE SEEN NONE SEEN   Mucus PRESENT     Comment: Performed at [facility] [address], [place], [place] [address]  Urine rapid drug screen (hosp performed)     Status: None   Collection Time: 06/05/20 10:32 AM  Result Value Ref Range   Opiates NONE DETECTED NONE DETECTED   Cocaine NONE DETECTED NONE DETECTED   Benzodiazepines NONE DETECTED NONE DETECTED  Amphetamines NONE DETECTED NONE DETECTED   Tetrahydrocannabinol NONE DETECTED NONE DETECTED   Barbiturates NONE DETECTED NONE DETECTED    Comment: (NOTE) DRUG SCREEN FOR MEDICAL PURPOSES ONLY.  IF CONFIRMATION IS NEEDED FOR ANY PURPOSE, NOTIFY LAB WITHIN 5 DAYS.  LOWEST DETECTABLE LIMITS FOR URINE DRUG SCREEN Drug Class                     Cutoff (ng/mL) Amphetamine and metabolites    1000 Barbiturate and metabolites    200 Benzodiazepine                 119 Tricyclics and metabolites     300 Opiates and metabolites        300 Cocaine and metabolites        300 THC                             50 Performed at [facility], [place] [address], [place], [place] [address]   Ammonia     Status: Abnormal   Collection Time: 06/05/20 10:34 AM  Result Value Ref Range   Ammonia 39 (H) 9 - 35 umol/L    Comment: Performed at [facility], [place] [address] [place], [place] [address]  T4, free     Status: Abnormal   Collection Time: 06/05/20 12:37 PM  Result Value Ref Range   Free T4 1.23 (H) 0.61 - 1.12 ng/dL    Comment: (NOTE) Biotin ingestion may interfere with free T4 tests. If the results are inconsistent with the TSH level, previous test results, or the clinical presentation, then consider biotin interference. If needed, order repeat testing after stopping biotin. Performed at [facility], [place] [address], [place], [place] [address]   Lactic acid, plasma     Status: Abnormal   Collection Time: 06/05/20  1:19 PM  Result Value Ref Range   Lactic Acid, Venous 4.4 (HH) 0.5 - 1.9 mmol/L    Comment: CRITICAL VALUE NOTED.  VALUE IS CONSISTENT WITH PREVIOUSLY REPORTED AND CALLED VALUE. Performed at [facility], [place] [address], [place], [place] [address]   Procalcitonin - Baseline     Status: None   Collection Time: 06/05/20  3:20 PM  Result Value Ref Range   Procalcitonin 0.20 ng/mL    Comment:        Interpretation: PCT (Procalcitonin) <= 0.5 ng/mL: Systemic infection (sepsis) is not likely. Local bacterial infection is possible. (NOTE)       Sepsis PCT Algorithm           Lower Respiratory Tract                                      Infection PCT Algorithm    ----------------------------     ----------------------------         PCT < 0.25 ng/mL                PCT < 0.10 ng/mL          Strongly encourage             Strongly discourage   discontinuation of antibiotics    initiation of antibiotics    ----------------------------     -----------------------------       PCT 0.25 - 0.50 ng/mL  PCT 0.10 - 0.25 ng/mL                OR       >80% decrease in PCT            Discourage initiation of                                            antibiotics      Encourage discontinuation           of antibiotics    ----------------------------     -----------------------------         PCT >= 0.50 ng/mL              PCT 0.26 - 0.50 ng/mL               AND        <80% decrease in PCT             Encourage initiation of                                             antibiotics       Encourage continuation           of antibiotics    ----------------------------     -----------------------------        PCT >= 0.50 ng/mL                  PCT > 0.50 ng/mL               AND         increase in PCT                  Strongly encourage                                      initiation of antibiotics    Strongly encourage escalation           of antibiotics                                     -----------------------------                                           PCT <= 0.25 ng/mL                                                 OR                                        > 80% decrease in PCT  Discontinue / Do not initiate                                             antibiotics  Performed at [facility], [place] [address], [place], [place] [address]   SARS Coronavirus 2 by RT PCR (hospital order, performed in [facility] hospital lab) Nasopharyngeal Nasopharyngeal Swab     Status: None   Collection Time: 06/05/20  6:28 PM   Specimen: Nasopharyngeal Swab  Result Value Ref Range   SARS Coronavirus 2 NEGATIVE NEGATIVE    Comment: (NOTE) SARS-CoV-2 target nucleic acids are NOT DETECTED.  The SARS-CoV-2 RNA is generally detectable in upper and lower respiratory specimens during the acute phase of infection. The lowest concentration of SARS-CoV-2 viral copies this assay can detect is 250 copies / mL. A negative result does not preclude SARS-CoV-2 infection and should not be used as the sole  basis for treatment or other patient management decisions.  A negative result may occur with improper specimen collection / handling, submission of specimen other than nasopharyngeal swab, presence of viral mutation(s) within the areas targeted by this assay, and inadequate number of viral copies (<250 copies / mL). A negative result must be combined with clinical observations, patient history, and epidemiological information.  Fact Sheet for Patients:   StrictlyIdeas.no  Fact Sheet for Healthcare Providers: BankingDealers.co.za  This test is not yet approved or  cleared by the [place] FDA and has been authorized for detection and/or diagnosis of SARS-CoV-2 by FDA under an Emergency Use Authorization (EUA).  This EUA will remain in effect (meaning this test can be used) for the duration of the COVID-19 declaration under Section 564(b)(1) of the Act, 21 U.S.C. section 360bbb-3(b)(1), unless the authorization is terminated or revoked sooner.  Performed at [facility], [place] [address] [place], [place] [address]   Lactic acid, plasma     Status: Abnormal   Collection Time: 06/05/20  6:50 PM  Result Value Ref Range   Lactic Acid, Venous 3.9 (HH) 0.5 - 1.9 mmol/L    Comment: CRITICAL VALUE NOTED.  VALUE IS CONSISTENT WITH PREVIOUSLY REPORTED AND CALLED VALUE. Performed at [facility], [place] [address], [place], [place] [address]   Comprehensive metabolic panel     Status: Abnormal   Collection Time: 06/06/20  5:37 AM  Result Value Ref Range   Sodium 141 135 - 145 mmol/L   Potassium 4.7 3.5 - 5.1 mmol/L   Chloride 108 98 - 111 mmol/L   CO2 21 (L) 22 - 32 mmol/L   Glucose, Bld 111 (H) 70 - 99 mg/dL    Comment: Glucose reference range applies only to samples taken after fasting for at least 8 hours.   BUN 12 8 - 23 mg/dL   Creatinine, Ser 1.09 0.61 - 1.24 mg/dL   Calcium 8.6 (L) 8.9 - 10.3 mg/dL   Total Protein 7.2 6.5  - 8.1 g/dL   Albumin 2.6 (L) 3.5 - 5.0 g/dL   AST 3,178 (H) 15 - 41 U/L    Comment: RESULTS CONFIRMED BY MANUAL DILUTION   ALT 130 (H) 0 - 44 U/L   Alkaline Phosphatase 398 (H) 38 - 126 U/L   Total Bilirubin 2.4 (H) 0.3 - 1.2 mg/dL   GFR calc non Af Amer >60 >60 mL/min   GFR calc Af Amer >60 >  60 mL/min   Anion gap 12 5 - 15    Comment: Performed at [facility] [address], [place], [place] [address]  CBC with Differential/Platelet     Status: Abnormal   Collection Time: 06/06/20  5:37 AM  Result Value Ref Range   WBC 5.6 4.0 - 10.5 K/uL   RBC 4.99 4.22 - 5.81 MIL/uL   Hemoglobin 12.8 (L) 13.0 - 17.0 g/dL   HCT 38.2 (L) 39 - 52 %   MCV 76.6 (L) 80.0 - 100.0 fL   MCH 25.7 (L) 26.0 - 34.0 pg   MCHC 33.5 30.0 - 36.0 g/dL   RDW 19.9 (H) 11.5 - 15.5 %   Platelets 168 150 - 400 K/uL    Comment: REPEATED TO VERIFY   nRBC 0.0 0.0 - 0.2 %   Neutrophils Relative % 84 %   Neutro Abs 4.7 1.7 - 7.7 K/uL   Lymphocytes Relative 8 %   Lymphs Abs 0.4 (L) 0.7 - 4.0 K/uL   Monocytes Relative 8 %   Monocytes Absolute 0.5 0 - 1 K/uL   Eosinophils Relative 0 %   Eosinophils Absolute 0.0 0 - 0 K/uL   Basophils Relative 0 %   Basophils Absolute 0.0 0 - 0 K/uL   Immature Granulocytes 0 %   Abs Immature Granulocytes 0.01 0.00 - 0.07 K/uL    Comment: Performed at [facility], [place] [address], [place], [place] [address]  Lactic acid, plasma     Status: Abnormal   Collection Time: 06/06/20  5:37 AM  Result Value Ref Range   Lactic Acid, Venous 3.9 (HH) 0.5 - 1.9 mmol/L    Comment: CRITICAL VALUE NOTED.  VALUE IS CONSISTENT WITH PREVIOUSLY REPORTED AND CALLED VALUE. Performed at [facility], [place] [address], [place], [place] [address]   Hepatic function panel     Status: Abnormal   Collection Time: 06/06/20  5:37 AM  Result Value Ref Range   Total Protein 7.2 6.5 - 8.1 g/dL   Albumin 2.6 (L) 3.5 - 5.0 g/dL   AST 3,001 (H) 15 - 41 U/L    Comment: RESULTS CONFIRMED BY MANUAL  DILUTION   ALT 129 (H) 0 - 44 U/L   Alkaline Phosphatase 396 (H) 38 - 126 U/L   Total Bilirubin 2.5 (H) 0.3 - 1.2 mg/dL   Bilirubin, Direct 1.0 (H) 0.0 - 0.2 mg/dL   Indirect Bilirubin 1.5 (H) 0.3 - 0.9 mg/dL    Comment: Performed at [facility] [address], [place], [place] [address]  Lactic acid, plasma     Status: Abnormal   Collection Time: 06/06/20 10:27 AM  Result Value Ref Range   Lactic Acid, Venous 4.0 (HH) 0.5 - 1.9 mmol/L    Comment: CRITICAL VALUE NOTED.  VALUE IS CONSISTENT WITH PREVIOUSLY REPORTED AND CALLED VALUE. Performed at [facility], [place] [address], [place], [place] [address]   Hepatitis B surface antigen     Status: None   Collection Time: 06/06/20 10:27 AM  Result Value Ref Range   Hepatitis B Surface Ag NON REACTIVE NON REACTIVE    Comment: Performed at [place] [address] [place], [place] [address]  Hepatitis A antibody, IgM     Status: None   Collection Time: 06/06/20 10:27 AM  Result Value Ref Range   Hep A IgM NON REACTIVE NON REACTIVE    Comment: Performed at [facility], [address] [place], [place] [address]  Hepatitis C antibody     Status: None   Collection Time: 06/06/20 10:27 AM  Result Value Ref Range   HCV Ab NON REACTIVE NON REACTIVE    Comment: (NOTE) Nonreactive HCV antibody screen is consistent with no HCV infections,  unless recent infection is suspected or other evidence exists to indicate HCV infection.  Performed at [facility], [place] [address], [place], [place] [address]   Gamma GT     Status: Abnormal   Collection Time: 06/06/20 10:27 AM  Result Value Ref Range   GGT 134 (H) 7 - 50 U/L    Comment: Performed at [facility], [place] [address], [place], [place] [address]   CT HEAD WO CONTRAST  Result Date: 06/05/2020 CLINICAL DATA:  [place] EXAM: CT HEAD WITHOUT CONTRAST TECHNIQUE: Contiguous axial images were obtained from the base of the skull through the vertex without  intravenous contrast. COMPARISON:  Head CT 01/20/2007 FINDINGS: Brain: No evidence of acute infarction, hemorrhage, hydrocephalus, extra-axial collection or mass lesion/mass effect. Cerebral volume loss since 2008, but age congruent. Vascular: No hyperdense vessel or unexpected calcification. Skull: Normal. Negative for fracture or focal lesion. Sinuses/Orbits: Negative IMPRESSION: Age normal head CT. Electronically Signed   By: Monte Fantasia M.D.   On: 06/05/2020 10:40   CT ABDOMEN PELVIS W CONTRAST  Result Date: 06/05/2020 CLINICAL DATA:  Nausea, vomiting.  Metastatic neuroendocrine tumor. EXAM: CT ABDOMEN AND PELVIS WITH CONTRAST TECHNIQUE: Multidetector CT imaging of the abdomen and pelvis was performed using the standard protocol following bolus administration of intravenous contrast. CONTRAST:  144m OMNIPAQUE IOHEXOL 300 MG/ML  SOLN COMPARISON:  PET CT 10/19/2019 FINDINGS: Lower chest: heart is normal size. Small pleural base nodule in the left lower chest is stable since prior study. Hepatobiliary: Extensive metastatic disease throughout the liver, similar to prior study. Gallbladder unremarkable. Pancreas: No focal abnormality or ductal dilatation. Spleen: No focal abnormality.  Normal size. Adrenals/Urinary Tract: Left upper pole renal cyst. No hydronephrosis. Adrenal glands unremarkable. Large diverticulum off the dome of the bladder measuring up to 8 cm. Other smaller diverticula noted. Stomach/Bowel: Mild bowel wall thickening noted in the cecum and ascending colon. Several loops of thick-walled small bowel centrally in the abdomen adjacent to the spiculated mesenteric mass. No evidence of bowel obstruction. Vascular/Lymphatic: No evidence of aneurysm or adenopathy. Reproductive: Prostate enlargement. Other: Small amount of free fluid in the abdomen and pelvis. No free air. Spiculated central mesenteric mass noted measuring up to 4.4 cm compared with 3.7 cm previously. Musculoskeletal: No acute  bony abnormality. IMPRESSION: Central mesenteric mass slightly larger on today's study when compared to prior PET CT, 4.4 cm compared to 3.7 cm previously. Extensive hepatic metastatic disease not significantly changed. Bowel wall thickening within central small bowel loops and right colon. Cannot exclude enteritis/colitis. Prostate enlargement with numerous bladder wall diverticula. Small amount of free fluid in the abdomen and pelvis. Stable small pleural nodule in the left lower lung. Electronically Signed   By: KRolm BaptiseM.D.   On: 06/05/2020 14:03   DG Chest Port 1 View  Result Date: 06/05/2020 CLINICAL DATA:  Altered mental status EXAM: PORTABLE CHEST 1 VIEW COMPARISON:  08/22/2014 FINDINGS: Heart and mediastinal contours are within normal limits. No focal opacities or effusions. No acute bony abnormality. IMPRESSION: No active disease. Electronically Signed   By: KRolm BaptiseM.D.   On: 06/05/2020 10:30   UKoreaAbdomen Limited RUQ  Result Date: 06/06/2020  CLINICAL DATA:  Elevated LFTs EXAM: ULTRASOUND ABDOMEN LIMITED RIGHT UPPER QUADRANT COMPARISON:  Abdominal CT from yesterday FINDINGS: Gallbladder: Polypoid mass measuring 4 mm.  No calculi or focal tenderness. Common bile duct: Not visualized.  No intrahepatic bile duct dilatation seen Liver: Innumerable metastases with diffuse heterogeneity of the liver, which is lobulated by CT. Small volume ascites noted in the right upper quadrant. Portal vein is patent on color Doppler imaging with normal direction of blood flow towards the liver. IMPRESSION: 1. Widespread hepatic metastatic disease. 2. 4 mm gallbladder polyp. Electronically Signed   By: Monte Fantasia M.D.   On: 06/06/2020 11:10      Assessment/Plan HTN Elevated TSH, Free T4 Hx of BPH s/p laser vaporization   Metastatic carcinoid tumor with chronic mesenteric tumor Transaminitis with hyperbilirubinemia - AST in the 3000s, Tbili 2.5, ALT mildly elevated, Alk Phos 396 Recent Fall  with abdominal pain  Vomiting and diarrhea ?entercolitis  - CT yesterday shows possible enteritis and small amount free fluid in abdomen and pelvis but no pneumotosis or concern for bowel ischemia - primary team concerned for acute ischemia given significant increase in LFTs - patient does not have abdominal pain on exam and does not appear to have an acute abdomen - primary team has ordered a CTA - will notify on call MD but I suspect that there is nothing surgical to offer this patient at this time  FEN: patient is on a regular diet, I have made NPO for now VTE: lovenox ID: [email or web address]   Norm Parcel, [facility] Surgery 06/06/2020, 4:07 PM Please see Amion for pager number during day hours 7:00am-4:30pm

## 2020-06-06 NOTE — ED Notes (Signed)
Attempted to obtain morning labs without success

## 2020-06-06 NOTE — Progress Notes (Signed)
Patient with large elevation in AST overnight from 60 to >3000, Tbili doubled from 1.2 to 2.4. CK WNL, troponin flat, EKG stable. RUQ [place] without duct dilatation, patent portal vein. Patient is uncomfortable and has abdominal pain, but not out of proportion to exam. Discussed case with Dr. Learta Codding, oncology, and Dr. Penelope Coop, GI, who recommend obtaining CTA ab/pelvis to look for diminished mesenteric blood flow or bowel compromise (no e/o on CT a/p done on admission at 1345 on 9/14). They recommend contacting surgery and/or IR for potential intervention. Will await further results of CTA. Otherwise, possible transient diminished blood supply to liver in setting of carcinoid tumor with numerous hepatic mets could be one possible explanation.  Gladys Damme, MD [place] [facility], PGY-2

## 2020-06-06 NOTE — Telephone Encounter (Signed)
Family asking when Dr. Benay Spice will be seeing him at [facility] and what the plans are? Informed her that MD has already seen him today, but will be rounding at [facility] again tomorrow morning between 0630 and 0645. He has and an acute issue with his bowel and GI and surgery have been consulted. Dr. Benay Spice has been in contact w/the ER physician caring for him. Daughter reporting he has been in ER almost 24 hours waiting for a bed and is this uncommon. Informed her that unfortunately with the COVID situation this is pretty common in hospitals around the [place] right now.

## 2020-06-06 NOTE — Progress Notes (Signed)
Family Medicine Teaching Service Daily Progress Note Intern Pager: [phone]  Patient name: Charles Schmidt Medical record number: [id number] Date of birth: [birth year] Age: 79 y.o. Gender: male  Primary Care Provider: Nolene Ebbs, MD Consultants: Oncology Code Status: FULL  Pt Overview and Major Events to Date:  9/14: admitted for weakness, vomiting, diarrhea  Assessment and Plan:  Charles Schmidt is a 79 y.o. male presenting with weakness in the setting of vomiting and diarrhea. PMH is significant for metastatic carcinoid tumor, BPH s/p laser vaporization, and HTN.  Elevated Transaminases, Hyperbilirubinemia AST elevated to 3178 this morning (from 60 yesterday). ALT elevated to 130 (from 34). Total bili increased to 2.4 (from 1.2 yesterday). Concern for acute viral hepatitis or Budd-Chiari. Less likely shock liver as patient has been normotensive and ALT only moderately elevated. -Repeat hepatic function panel -If repeat elevated, will obtain viral hepatitis panel and RUQ [place]  Acute on Chronic Diarrhea, Vomiting, Abdominal Pain Patient with ongoing vomiting and episodic abdominal pain overnight. Was given morphine and Zofran with relief. Nontender on abdominal exam this morning but patient appears ill and very fatigued. -Repeat hepatic function panel -Viral hepatitis panel, RUQ [place] -Oncologist to see -Will give 1L NS bolus -mIVF NS @ 150 cc/hr -f/u GI pathogen panel and C. Diff PCR  Weakness likely 2/2 Dehydration Patient still with subjective weakness and appears weak overall, but has not gotten out of bed. -PT/OT eval -1L NS bolus -mIVF with NS @ 150 cc/hr  Hx Metastatic Carcinoid Tumor  Chronic Mesenteric Tumor Patient's oncologist is Dr. Betsy Coder at [facility]. He receives monthly sandostatin, last dose 05/22/20.  -Oncology to see -If GI panel/C. Diff negative, consider sandostatin  -Likely will need re-staging as outpatient  Hx Prostatic Hypertrophy s/p laser  vaporization Chronic, stable. On Flomax and Proscar at home. -Continue home Flomax 0.4mg  daily and Proscar 5mg  daily  Hx HTN Patient has been normotensive since arrival. Most recent BP 134/88. Home meds: amlodipine 5mg  daily. -Holding amlodipine for now -Monitor BP  Elevated TSH, Free T4 Patient's TSH and Free T4 slightly elevated at 7.871 and 1.23 on admission.  -Recheck as outpatient    FEN/GI: Regular diet PPx: Lovenox  Status is: Inpatient Remains inpatient appropriate because:Ongoing diagnostic testing needed not appropriate for outpatient work up   Dispo: The patient is from: Home              Anticipated d/c is to: SNF              Anticipated d/c date is: > 3 days              Patient currently is not medically stable to d/c.   Subjective:  Patient very tired this morning, does not offer much history. Denies abdominal pain, nausea, or vomiting currently. Still feels extremely weak.   Objective: Temp:  [97.6 F (36.4 C)-98.3 F (36.8 C)] 97.9 F (36.6 C) (09/15 3267) Pulse Rate:  [73-95] 88 (09/15 0638) Resp:  [15-23] 19 (09/15 0638) BP: (113-144)/(69-99) 134/88 (09/15 0638) SpO2:  [93 %-100 %] 98 % (09/15 1245) Physical Exam: General: sleeping, appears uncomfortable, weak Cardiovascular: RRR, normal S1/S2 without m/r/g Respiratory: normal WOB, lungs CTAB Abdomen: hypoactive BS, firm, no tenderness to palpation Extremities: no peripheral edema, 2+ distal pulses  Laboratory: Recent Labs  Lab 06/05/20 0957 06/06/20 0537  WBC 4.0 5.6  HGB 12.2* 12.8*  HCT 37.2* 38.2*  PLT 198 168   Recent Labs  Lab 06/05/20 0957 06/06/20 0537  NA 141  141  K 3.6 4.7  CL 106 108  CO2 20* 21*  BUN 10 12  CREATININE 1.20 1.09  CALCIUM 8.5* 8.6*  PROT 7.0 7.2  7.2  BILITOT 1.2 2.5*  2.4*  ALKPHOS 333* 396*  398*  ALT 34 129*  130*  AST 60* PENDING  3,178*  GLUCOSE 184* 111*   Lactic acid: 5.8>4.4>3.9>3.9  Imaging/Diagnostic Tests: CT ABDOMEN PELVIS W  CONTRAST Result Date: 06/05/2020 IMPRESSION: Central mesenteric mass slightly larger on today's study when compared to prior PET CT, 4.4 cm compared to 3.7 cm previously. Extensive hepatic metastatic disease not significantly changed. Bowel wall thickening within central small bowel loops and right colon. Cannot exclude enteritis/colitis. Prostate enlargement with numerous bladder wall diverticula. Small amount of free fluid in the abdomen and pelvis. Stable small pleural nodule in the left lower lung.    [name], MD 06/06/2020, 9:47 AM PGY-1, [place] Intern pager: [phone], text pages welcome

## 2020-06-06 NOTE — ED Notes (Signed)
IV infiltrated again Fluids stopped IV team consulted

## 2020-06-06 NOTE — Progress Notes (Signed)
Patient ID: Charles Schmidt, male   DOB: 07/19/1941, 79 y.o.   MRN: [id number] Pt s/p CT angio A/P today with extravasation of 20 cc saline/80 cc contrast at left upper arm IV site; site examined and soft but edematous, NT, intact distal pulses; no paresthesias reported; rec ice pack to site/arm elevation; will cont to monitor

## 2020-06-07 ENCOUNTER — Inpatient Hospital Stay (HOSPITAL_COMMUNITY): Payer: Medicare Other

## 2020-06-07 DIAGNOSIS — R109 Unspecified abdominal pain: Secondary | ICD-10-CM

## 2020-06-07 DIAGNOSIS — R778 Other specified abnormalities of plasma proteins: Secondary | ICD-10-CM

## 2020-06-07 LAB — CK
Total CK: 6856 U/L — ABNORMAL HIGH (ref 49–397)
Total CK: 6025 U/L — ABNORMAL HIGH (ref 49–397)
Total CK: 4903 U/L — ABNORMAL HIGH (ref 49–397)
Total CK: 4100 U/L — ABNORMAL HIGH (ref 49–397)

## 2020-06-07 LAB — RENAL FUNCTION PANEL
Albumin: 1.9 g/dL — ABNORMAL LOW (ref 3.5–5.0)
Albumin: 1.9 g/dL — ABNORMAL LOW (ref 3.5–5.0)
Anion gap: 16 — ABNORMAL HIGH (ref 5–15)
Anion gap: 16 — ABNORMAL HIGH (ref 5–15)
BUN: 27 mg/dL — ABNORMAL HIGH (ref 8–23)
BUN: 34 mg/dL — ABNORMAL HIGH (ref 8–23)
CO2: 14 mmol/L — ABNORMAL LOW (ref 22–32)
CO2: 13 mmol/L — ABNORMAL LOW (ref 22–32)
Calcium: 7.1 mg/dL — ABNORMAL LOW (ref 8.9–10.3)
Calcium: 7.1 mg/dL — ABNORMAL LOW (ref 8.9–10.3)
Chloride: 115 mmol/L — ABNORMAL HIGH (ref 98–111)
Chloride: 113 mmol/L — ABNORMAL HIGH (ref 98–111)
Creatinine, Ser: 2.53 mg/dL — ABNORMAL HIGH (ref 0.61–1.24)
Creatinine, Ser: 3.01 mg/dL — ABNORMAL HIGH (ref 0.61–1.24)
GFR calc Af Amer: 27 mL/min — ABNORMAL LOW (ref >60)
GFR calc Af Amer: 22 mL/min — ABNORMAL LOW (ref >60)
GFR calc non Af Amer: 23 mL/min — ABNORMAL LOW (ref >60)
GFR calc non Af Amer: 19 mL/min — ABNORMAL LOW (ref >60)
Glucose, Bld: 64 mg/dL — ABNORMAL LOW (ref 70–99)
Glucose, Bld: 86 mg/dL (ref 70–99)
Phosphorus: 7.7 mg/dL — ABNORMAL HIGH (ref 2.5–4.6)
Phosphorus: 8.2 mg/dL — ABNORMAL HIGH (ref 2.5–4.6)
Potassium: 4.2 mmol/L (ref 3.5–5.1)
Potassium: 4.2 mmol/L (ref 3.5–5.1)
Sodium: 145 mmol/L (ref 135–145)
Sodium: 142 mmol/L (ref 135–145)

## 2020-06-07 LAB — HEPATIC FUNCTION PANEL
ALT: 85 U/L — ABNORMAL HIGH (ref 0–44)
AST: 339 U/L — ABNORMAL HIGH (ref 15–41)
Albumin: 1.9 g/dL — ABNORMAL LOW (ref 3.5–5.0)
Alkaline Phosphatase: 287 U/L — ABNORMAL HIGH (ref 38–126)
Bilirubin, Direct: 0.7 mg/dL — ABNORMAL HIGH (ref 0.0–0.2)
Indirect Bilirubin: 0.8 mg/dL (ref 0.3–0.9)
Total Bilirubin: 1.5 mg/dL — ABNORMAL HIGH (ref 0.3–1.2)
Total Protein: 5.6 g/dL — ABNORMAL LOW (ref 6.5–8.1)

## 2020-06-07 LAB — COMPREHENSIVE METABOLIC PANEL
ALT: 101 U/L — ABNORMAL HIGH (ref 0–44)
AST: 879 U/L — ABNORMAL HIGH (ref 15–41)
Albumin: 2.1 g/dL — ABNORMAL LOW (ref 3.5–5.0)
Alkaline Phosphatase: 357 U/L — ABNORMAL HIGH (ref 38–126)
Anion gap: 17 — ABNORMAL HIGH (ref 5–15)
BUN: 23 mg/dL (ref 8–23)
CO2: 12 mmol/L — ABNORMAL LOW (ref 22–32)
Calcium: 7.4 mg/dL — ABNORMAL LOW (ref 8.9–10.3)
Chloride: 111 mmol/L (ref 98–111)
Creatinine, Ser: 2.05 mg/dL — ABNORMAL HIGH (ref 0.61–1.24)
GFR calc Af Amer: 35 mL/min — ABNORMAL LOW (ref >60)
GFR calc non Af Amer: 30 mL/min — ABNORMAL LOW (ref >60)
Glucose, Bld: 78 mg/dL (ref 70–99)
Potassium: 3.9 mmol/L (ref 3.5–5.1)
Sodium: 140 mmol/L (ref 135–145)
Total Bilirubin: 1.8 mg/dL — ABNORMAL HIGH (ref 0.3–1.2)
Total Protein: 6.1 g/dL — ABNORMAL LOW (ref 6.5–8.1)

## 2020-06-07 LAB — CBC WITH DIFFERENTIAL/PLATELET
Abs Immature Granulocytes: 0.02 10*3/uL (ref 0.00–0.07)
Basophils Absolute: 0 10*3/uL (ref 0.0–0.1)
Basophils Relative: 1 %
Eosinophils Absolute: 0 10*3/uL (ref 0.0–0.5)
Eosinophils Relative: 0 %
HCT: 36.1 % — ABNORMAL LOW (ref 39.0–52.0)
Hemoglobin: 12.6 g/dL — ABNORMAL LOW (ref 13.0–17.0)
Immature Granulocytes: 1 %
Lymphocytes Relative: 11 %
Lymphs Abs: 0.5 10*3/uL — ABNORMAL LOW (ref 0.7–4.0)
MCH: 26 pg (ref 26.0–34.0)
MCHC: 34.9 g/dL (ref 30.0–36.0)
MCV: 74.6 fL — ABNORMAL LOW (ref 80.0–100.0)
Monocytes Absolute: 0.5 10*3/uL (ref 0.1–1.0)
Monocytes Relative: 12 %
Neutro Abs: 3.3 10*3/uL (ref 1.7–7.7)
Neutrophils Relative %: 75 %
Platelets: 147 10*3/uL — ABNORMAL LOW (ref 150–400)
RBC: 4.84 MIL/uL (ref 4.22–5.81)
RDW: 19.9 % — ABNORMAL HIGH (ref 11.5–15.5)
WBC: 4.3 10*3/uL (ref 4.0–10.5)
nRBC: 0 % (ref 0.0–0.2)

## 2020-06-07 LAB — MAGNESIUM
Magnesium: 2.2 mg/dL (ref 1.7–2.4)
Magnesium: 2.2 mg/dL (ref 1.7–2.4)

## 2020-06-07 LAB — ALT: ALT: 81 U/L — ABNORMAL HIGH (ref 0–44)

## 2020-06-07 LAB — BILIRUBIN, TOTAL: Total Bilirubin: 1.4 mg/dL — ABNORMAL HIGH (ref 0.3–1.2)

## 2020-06-07 LAB — TROPONIN I (HIGH SENSITIVITY)
Troponin I (High Sensitivity): 156 ng/L (ref <18)
Troponin I (High Sensitivity): 145 ng/L (ref <18)
Troponin I (High Sensitivity): 106 ng/L (ref <18)

## 2020-06-07 LAB — BILIRUBIN, DIRECT: Bilirubin, Direct: 0.8 mg/dL — ABNORMAL HIGH (ref 0.0–0.2)

## 2020-06-07 LAB — PHOSPHORUS: Phosphorus: 7 mg/dL — ABNORMAL HIGH (ref 2.5–4.6)

## 2020-06-07 LAB — PROTEIN, TOTAL: Total Protein: 5.5 g/dL — ABNORMAL LOW (ref 6.5–8.1)

## 2020-06-07 LAB — ALKALINE PHOSPHATASE: Alkaline Phosphatase: 289 U/L — ABNORMAL HIGH (ref 38–126)

## 2020-06-07 MED ORDER — LACTATED RINGERS IV SOLN: INTRAVENOUS | Status: DC

## 2020-06-07 MED ORDER — CALCIUM CARBONATE ANTACID 500 MG PO CHEW
1.0000 | CHEWABLE_TABLET | Freq: Three times a day (TID) | ORAL | Status: DC
  Administered 2020-06-07 (×2): 200 mg via ORAL
  Filled 2020-06-07 (×2): qty 1

## 2020-06-07 MED ORDER — SODIUM CHLORIDE 0.9 % IV BOLUS
1000.0000 mL | Freq: Once | INTRAVENOUS | Status: AC
  Administered 2020-06-07: 1000 mL via INTRAVENOUS

## 2020-06-07 MED ORDER — ENOXAPARIN SODIUM 30 MG/0.3ML ~~LOC~~ SOLN
30.0000 mg | SUBCUTANEOUS | Status: DC
  Administered 2020-06-08 – 2020-06-23 (×16): 30 mg via SUBCUTANEOUS
  Filled 2020-06-07 (×16): qty 0.3

## 2020-06-07 MED ORDER — CHLORHEXIDINE GLUCONATE CLOTH 2 % EX PADS
6.0000 | MEDICATED_PAD | Freq: Every day | CUTANEOUS | Status: DC
  Administered 2020-06-07 – 2020-07-03 (×27): 6 via TOPICAL

## 2020-06-07 MED ORDER — SODIUM CHLORIDE 0.9 % IV BOLUS: 1000.0000 mL | Freq: Once | INTRAVENOUS | Status: DC

## 2020-06-07 MED ORDER — LACTATED RINGERS IV BOLUS
500.0000 mL | Freq: Once | INTRAVENOUS | Status: AC
  Administered 2020-06-07: 500 mL via INTRAVENOUS

## 2020-06-07 NOTE — Progress Notes (Signed)
Critical  Lab called Troponin 156 pt has no complaints of chest pain, pt asymptomatic DR Eureka Cellar notified no orders given will continue to monitor

## 2020-06-07 NOTE — Progress Notes (Signed)
RN has noted bleeding in urine return of foley catheter, MD has been notified, no new orders at this time. RN will continue to monitor paitent

## 2020-06-07 NOTE — Progress Notes (Signed)
Pharmacy Note  Pt with AKI; per Nephrology, likely as a consequence of acute on chronic bladder outlet obstruction, IV contrast exposure X 2, rhabdomyolysis, hypovolemia. Scr continues to rise, currently 3.01, TBW CrCl down to ~28 ml/min. CBC stable.  Adjusted Lovenox to 30 mg SQ daily, for TBW CrCl <30 ml/min. Pharmacy will continue to monitor renal function and adjust Lovenox as needed.  Gillermina Hu, PharmD, BCPS, [facility] Clinical Pharmacist

## 2020-06-07 NOTE — Progress Notes (Signed)
Family Medicine Teaching Service Daily Progress Note Intern Pager: [phone]  Patient name: Charles Schmidt Medical record number: [id number] Date of birth: 09-07-1941 Age: 79 y.o. Gender: male  Primary Care Provider: Nolene Ebbs, MD Consultants: Oncology, GI, Surgery Code Status: FULL  Pt Overview and Major Events to Date:  9/14: admitted for weakness, vomiting, diarrhea. Oncology consulted 9/15: Extreme elevation in AST, GI and surgery consulted, CTA abd/pelvis obtained 9/16: CK and troponin elevated  Assessment and Plan:  Charles Schmidt a 79 y.o.malepresenting with weakness in the setting of vomiting and diarrhea.PMH is significant for metastatic carcinoid tumor, BPH s/p laser vaporization, and HTN.  Rhabdomyolysis with AKI, hyperphosphatemia Patient's CK elevated to 6856 this morning, from 147 on admission. Repeat CK 6025. Additionally, creatinine 2.05 today from 1.09 yesterday. Suspect rhabdomyolysis from fall prior to admission. Phosphate elevated to 7.0 but all other electrolytes wnl.  -NS @ 150cc/hr -Recheck CK and renal function panel -Tums TID for hyperphosphatemia  Urinary Retention  Hx prostatic hypertrophy s/p laser vaporization Patient with no urine output overnight. Bladder scan revealed 839mL urine this morning. Known bladder outlet obstruction visualized on CT imaging 2/2 enlarged prostate. -Will attempt in and out cath -May need to consider Foley placement if ongoing urinary retention  Elevated Troponin Troponin elevated to 156 overnight (from 10 on admission). Repeat troponin 145. EKG showed chronic RBBB but otherwise unremarkable. Suspect demand ischemia in the setting of his acute illness. -Will obtain additional troponin to ensure downtrending  Elevated Transaminases, Hyperbilirubinemia Improving. AST/ALT downtrending to 879/101 from 3001/129 yesterday. Total bili 1.8 (from 2.5 yesterday). Suspect etiology is multifactorial: possible ischemic,  shock-liver event related to his fall prior to arrival, rhabdomyolysis also likely responsible for some of his AST elevation. Additionally, may be related to progression of his oncologic disease. Hepatitis negative.  -Will recheck hepatic function panel -Continue hydration with NS @ 150cc/hr -Oncology following  Hx metastatic carcinoid tumor, central mesenteric mass with extensive liver metastases CTA showed mesenteric mass encasing the SMA, but SMA is patent. CT obtained on admission showed slight enlargement of central mesenteric mass with stable but innumerable  hepatic metastases. -Oncology following, appreciate recommendations  Acute on chronic diarrhea, vomiting, abdominal pain Abdominal pain and vomiting have resolved. Diarrhea likely related to carcinoid disease. GI pathogen panel negative.  -C. Diff PCR pending -Oncology following -Zofran 4mg  q6h prn for nausea -Will assess ability to take PO today -Imodium TID and Lomotil -Can consider octreotide or sandostatin per oncology recs  Low BP, Hx of HTN Patient's BP has been normal since admission but is low this morning at 96/56.  -Holding home amlodipine -Monitor BP  Weakness likely 2/2 dehydration -Ongoing hydration with IVF (N@ @ 150 cc/hr) -Encourage PO intake -Supportive care for diarrhea as above -PT eval -If ongoing failure to thrive, may need hospice   FEN/GI: Regular diet PPx: Lovenox  Status is: Inpatient Remains inpatient appropriate because:Persistent severe electrolyte disturbances, Ongoing diagnostic testing needed not appropriate for outpatient work up and Inpatient level of care appropriate due to severity of illness  Dispo: The patient is from: Home              Anticipated d/c is to: SNF              Anticipated d/c date is: > 3 days              Patient currently is not medically stable to d/c.   Subjective:  Patient states he feels weak. Denies all other complaints including  abdominal pain, chest  pain, nausea, or vomiting. Per daughter in law, patient did have one episode of diarrhea overnight.    Objective: Temp:  [97.7 F (36.5 C)-98.4 F (36.9 C)] 97.9 F (36.6 C) (09/16 0902) Pulse Rate:  [113-123] 113 (09/16 0902) Resp:  [14-18] 18 (09/16 0902) BP: (96-134)/(55-85) 96/56 (09/16 0902) SpO2:  [93 %-95 %] 93 % (09/16 0902) Weight:  [100.1 kg] 100.1 kg (09/16 0800) Physical Exam: General: alert, NAD, elderly gentleman laying comfortably in bed, appears fatigued Cardiovascular: RRR, normal S1/S2 without m/r/g Respiratory: normal WOB, lungs CTAB Abdomen: +BS, soft, nontender, nondistended Extremities: 2+ distal pulses, no peripheral edema, 5/5 strength in bilateral upper and lower extremities  Laboratory: Recent Labs  Lab 06/05/20 0957 06/06/20 0537 06/07/20 0134  WBC 4.0 5.6 4.3  HGB 12.2* 12.8* 12.6*  HCT 37.2* 38.2* 36.1*  PLT 198 168 147*   Recent Labs  Lab 06/05/20 0957 06/06/20 0537 06/07/20 0134  NA 141 141 140  K 3.6 4.7 3.9  CL 106 108 111  CO2 20* 21* 12*  BUN 10 12 23   CREATININE 1.20 1.09 2.05*  CALCIUM 8.5* 8.6* 7.4*  PROT 7.0 7.2  7.2 6.1*  BILITOT 1.2 2.5*  2.4* 1.8*  ALKPHOS 333* 396*  398* 357*  ALT 34 129*  130* 101*  AST 60* 3,001*  3,178* 879*  GLUCOSE 184* 111* 78   CK 6025 Trop 145  Imaging/Diagnostic Tests: DG Chest Portable 1 View Result Date: 06/07/2020 IMPRESSION: Low lung volumes with similar left greater than right basilar airspace disease, atelectasis versus pneumonia.   DG CHEST PORT 1 VIEW Result Date: 06/07/2020 IMPRESSION:  1. Low lung volumes, with basilar areas of atelectasis.  2. Increased central vascular congestion.  3. Trace left pleural effusion.   DG Abd Portable 1V Result Date: 06/07/2020 IMPRESSION:  1. Unremarkable bowel gas pattern.  2. Contrast filled bladder, with numerous diverticula, consistent with chronic bladder outlet obstruction.  CT Angio Abd/Pel w/ and/or w/o Result Date:  06/06/2020 IMPRESSION:  VASCULAR  1. Limited CTA due to timing of the contrast bolus.  2. Encasement of the distal SMA by the known neuroendocrine mass within the central mesentery. Evaluation of the branches beyond this level are limited due to timing of contrast bolus.  3. Otherwise unremarkable CTA.  NON-VASCULAR  1. Progressive bowel wall thickening involving the distal jejunum and ileum. While progressive enteritis could give this appearance, bowel ischemia is suspected given patient's known elevated lactate level. This is likely due to involvement of the distal SMA branches by the spiculated neuroendocrine malignancy within the central mesentery.  2. Stable spiculated central mesenteric mass compatible with known malignancy. Stable diffuse metastases throughout the liver.  3. Trace ascites.  4. Small bilateral pleural effusions, new since previous exam.  5. Stable sequela of chronic bladder outlet obstruction due to an enlarged prostate.   US Abdomen Limited RUQ Result Date: 06/06/2020 IMPRESSION:  1. Widespread hepatic metastatic disease.  2. 4 mm gallbladder polyp.    [name], MD 06/07/2020, 11:40 AM PGY-1, [place] Intern pager: [phone], text pages welcome

## 2020-06-07 NOTE — Progress Notes (Signed)
Pt arrived on unit with HR in 110-120's. Pt asymptomatic at th e time and complains of no pain. Alert and Oriented x2-3. Pt has poor safety judgement and trying to get out of bed. Easily redirected/ reoriented. Pt having loose stools, on enteric precautions, but no orders or results for CDIFF culture. Will continue to monitor pt, and reass  MEWS protocol.

## 2020-06-07 NOTE — Progress Notes (Signed)
RN notified MD of patient family refusal of RN attempting foley catheter and their overall concern for speaking with MD about their family member. MD team informed RN that someone will be up to speak with family at the bedside, no new orders have been placed. RN will continue to monitor patient

## 2020-06-07 NOTE — Progress Notes (Signed)
FPTS Interim Progress Note  S: Went to evaluate patient, he was being changed.  The nurse reports that he is at baseline for her but she was told he was a little bit more alert previously in the day.  Patient was mildly tachycardic in the low 100s and blood pressures were 90s/50s with maps in the 60s.  He was oriented to person but not place or time.  O: BP (!) 90/57 (BP Location: Left Arm)   Pulse (!) 107   Temp 98.5 F (36.9 C) (Oral)   Resp (!) 21   Ht 6\' 4"  (1.93 m)   Wt 100.1 kg   SpO2 97%   BMI 26.86 kg/m   General: Ill-appearing 79 year old Cardiac: Tachycardic in the low 100s Respiratory: Normal work of breathing Abdomen: No suprapubic tenderness noted, intermittent abdominal tightening when palpating, positive bowel sounds GU: Foley catheter in place, blood around meatus, no urine output in Foley catheter bag, nurse paged right after evaluation to inform me that bladder scan revealed 127 mL in the bladder Psych: Intermittent alertness, oriented to person but not place or time  A/P: Goals of Care  Metastatic Carcinoid Tumor to liver + Mesenteric Tumor : - Palliative consulted for [place] discussion given multiorgan failure - patient's oncologist following, appreciate recs  AKI in setting of likely hypotension with fall, contrast exposure, rhabdo, and urinary obstruction 2/2 BPH:  -Nephro consulted, appreciate recommendations -Spoke with nephro tonight and they recommended a 500 mL fluid bolus followed by 150 mL/h of LR -Foley in place with no urine output at this time -Urology has been consulted and they recommended flushing the Foley catheter if concerned about obstruction -Every 4 hour bladder scans, reflush if building up  Tachycardia and Hypotension: - continue aggressive fluid hydration -Patient is currently on a progressive floor, consider CCM consult if patient becomes more hypotensive/tachycardic MAP less than 60 - CXR with L>R basilar airspace disease, atelectasis  vs PNA. No O2 requirement or respiratory symptoms. GI work up thus far negative. CK/Trop improving.  - consider blood cultures, UA, CXR, antibiotics if continues to decline or develops fever - if develops respiratory changes, consider CTA to rule out PE given hypercoagulable state although has been on Lovenox for DVT ppx since admission  Gifford Shave, MD 06/07/2020, 9:59 PM PGY-2, [place] Medicine Service pager [phone]

## 2020-06-07 NOTE — Progress Notes (Signed)
   06/07/20 2212  Vitals  BP 102/67  MAP (mmHg) 78  BP Location Left Arm  BP Method Automatic  Patient Position (if appropriate) Lying  Pulse Rate (!) 109  ECG Heart Rate (!) 110  Resp 17  MEWS COLOR  MEWS Score Color Green  Oxygen Therapy  SpO2 94 %  O2 Device Room Air  MEWS Score  MEWS Temp 0  MEWS Systolic 0  MEWS Pulse 1  MEWS RR 0  MEWS LOC 0  MEWS Score 1  Bolus Complete

## 2020-06-07 NOTE — Progress Notes (Signed)
PT Cancellation Note  Patient Details Name: Charles Schmidt MRN: [id number] DOB: [birth year]   Cancelled Treatment:    Reason Eval/Treat Not Completed: Medical issues which prohibited therapy - In the morning, the RN asked PT and OT to check back later as she was starting a bolus for low BP and had to insert a foley.  Later in the afternoon, the RN asked PT to hold again due to pt condition, and informed PT that the pt is being transferred to a progressive care unit. PT will continue to follow and eval as appropriate.   Hardie Pulley, DPT   Acute Rehabilitation Department Pager #: [phone]    Otho Bellows 06/07/2020, 3:33 PM

## 2020-06-07 NOTE — Progress Notes (Addendum)
RN attempted to place in and catheter for patient and was unsuccessful as there was great resistance with 68mL of urine output. RN removed catheter and there was some blood on the tip of the catheter. MD gave verbal order for RN to attempt foley catheter and to contact urologyif unsuccessful in the attempt. RN explained to family member at the bedside that a foley catheter would be attempted and the family member has refused this attempt and requested that the MD team step in. RN has notified MD of this update.

## 2020-06-07 NOTE — Progress Notes (Signed)
Interventional Radiology Brief Note:  Patient assessed after contrast extravasation in CT yesterday.  Left arm without tenderness or swelling.  IV has since been removed. ROM intact although weak. Continue with ice pack prn.  Per RN, patient has declined ice and elevation when offered.   Brynda Greathouse, MS RD PA-C 4:14 PM

## 2020-06-07 NOTE — Progress Notes (Signed)
FPTS Interim Progress Note  Received page concerning bloody return from foley catheter. Note it was initially urine return but since has turned bloody.  Spoke to urology for recommendations.   Recommendations: - irrigate foley/bladder with 60cc of NS to ensure proper placement and return - if proper placement, then can continue to monitor output as bloody return can be normal after bladder distention - if blood becomes thick or clogs foley, then reach out to urology and they will come evaluate   Recommendations relayed to nurse. Will continue to monitor   Danna Hefty, DO 06/07/2020, 3:12 PM PGY-3, [place] Medicine Service pager [phone]

## 2020-06-07 NOTE — Progress Notes (Signed)
OT Cancellation Note  Patient Details Name: Quindell Shere MRN: [id number] DOB: 1940-10-13   Cancelled Treatment:    Reason Eval/Treat Not Completed: Medical issues which prohibited therapy- RN requesting OT to check back later as she is starting a bolus for low BP and has insert a foley.  Will follow and see as able.   Jolaine Artist, OT Acute Rehabilitation Services Pager [phone] Office [phone]    Delight Stare 06/07/2020, 11:13 AM

## 2020-06-07 NOTE — Progress Notes (Signed)
Patients's sats dropped to the 80's on room air. Placed on 2L nasal canula sats 95%. Will continue to monitor.

## 2020-06-07 NOTE — Progress Notes (Signed)
RN has given report to receiving nurse, Rona Ravens on 3E progressive Rm. 29. RN is transferring this patient to the unit

## 2020-06-07 NOTE — Progress Notes (Addendum)
IP PROGRESS NOTE   Subjective:   Remains weak. Still with frequent diarrhea. Has some nausea and decreased PO intake as well. Denies pain.   Objective: Vital signs in last 24 hours: Blood pressure (!) 86/59, pulse (!) 113, temperature 97.9 F (36.6 C), temperature source Axillary, resp. rate 18, height 6' 4" (1.93 m), weight 100.1 kg, SpO2 93 %.  Intake/Output from previous day: No intake/output data recorded.  Physical Exam: HEENT: No thrush Lungs: Clear bilaterally Cardiac: Regular rate and rhythm Abdomen: Marked hepatomegaly extending into the left abdomen Extremities: No leg edema Neurologic: Alert, follows commands  Lab Results: Recent Labs    06/06/20 0537 06/07/20 0134  WBC 5.6 4.3  HGB 12.8* 12.6*  HCT 38.2* 36.1*  PLT 168 147*    BMET Recent Labs    06/07/20 0134 06/07/20 1143  NA 140 145  K 3.9 4.2  CL 111 115*  CO2 12* 14*  GLUCOSE 78 64*  BUN 23 27*  CREATININE 2.05* 2.53*  CALCIUM 7.4* 7.1*    No results found for: CEA1  Studies/Results: CT ABDOMEN PELVIS W CONTRAST  Result Date: 06/05/2020 CLINICAL DATA:  Nausea, vomiting.  Metastatic neuroendocrine tumor. EXAM: CT ABDOMEN AND PELVIS WITH CONTRAST TECHNIQUE: Multidetector CT imaging of the abdomen and pelvis was performed using the standard protocol following bolus administration of intravenous contrast. CONTRAST:  125m OMNIPAQUE IOHEXOL 300 MG/ML  SOLN COMPARISON:  PET CT 10/19/2019 FINDINGS: Lower chest: heart is normal size. Small pleural base nodule in the left lower chest is stable since prior study. Hepatobiliary: Extensive metastatic disease throughout the liver, similar to prior study. Gallbladder unremarkable. Pancreas: No focal abnormality or ductal dilatation. Spleen: No focal abnormality.  Normal size. Adrenals/Urinary Tract: Left upper pole renal cyst. No hydronephrosis. Adrenal glands unremarkable. Large diverticulum off the dome of the bladder measuring up to 8 cm. Other smaller  diverticula noted. Stomach/Bowel: Mild bowel wall thickening noted in the cecum and ascending colon. Several loops of thick-walled small bowel centrally in the abdomen adjacent to the spiculated mesenteric mass. No evidence of bowel obstruction. Vascular/Lymphatic: No evidence of aneurysm or adenopathy. Reproductive: Prostate enlargement. Other: Small amount of free fluid in the abdomen and pelvis. No free air. Spiculated central mesenteric mass noted measuring up to 4.4 cm compared with 3.7 cm previously. Musculoskeletal: No acute bony abnormality. IMPRESSION: Central mesenteric mass slightly larger on today's study when compared to prior PET CT, 4.4 cm compared to 3.7 cm previously. Extensive hepatic metastatic disease not significantly changed. Bowel wall thickening within central small bowel loops and right colon. Cannot exclude enteritis/colitis. Prostate enlargement with numerous bladder wall diverticula. Small amount of free fluid in the abdomen and pelvis. Stable small pleural nodule in the left lower lung. Electronically Signed   By: KRolm BaptiseM.D.   On: 06/05/2020 14:03   DG Chest Portable 1 View  Result Date: 06/07/2020 CLINICAL DATA:  Tachycardia EXAM: PORTABLE CHEST 1 VIEW COMPARISON:  06/07/2020, PET CT 10/19/2019 FINDINGS: Low lung volumes. Possible trace pleural effusions. Similar left greater than right basilar airspace disease. Stable cardiomediastinal silhouette. No pneumothorax. IMPRESSION: Low lung volumes with similar left greater than right basilar airspace disease, atelectasis versus pneumonia. Electronically Signed   By: KDonavan FoilM.D.   On: 06/07/2020 01:26   DG CHEST PORT 1 VIEW  Result Date: 06/07/2020 CLINICAL DATA:  Elevated troponin, abdominal pain EXAM: PORTABLE CHEST 1 VIEW COMPARISON:  06/05/2020 FINDINGS: Single frontal view of the chest demonstrates a stable cardiac silhouette. There is  chronic elevation of the right hemidiaphragm. Lung volumes remain diminished,  with increased central vascular congestion and interstitial prominence since prior study. There is patchy consolidation at the left lung base, with trace left pleural effusion. No pneumothorax. IMPRESSION: 1. Low lung volumes, with basilar areas of atelectasis. 2. Increased central vascular congestion. 3. Trace left pleural effusion. Electronically Signed   By: Randa Ngo M.D.   On: 06/07/2020 00:51   DG Abd Portable 1V  Result Date: 06/07/2020 CLINICAL DATA:  Abdominal pain, elevated troponin EXAM: PORTABLE ABDOMEN - 1 VIEW COMPARISON:  06/06/2020 FINDINGS: 2 supine frontal views of the abdomen and pelvis demonstrates excreted contrast within a distended bladder. Multiple bladder diverticula are compatible with chronic bladder outlet obstruction. No evidence of bowel obstruction or ileus. No acute bony abnormalities. IMPRESSION: 1. Unremarkable bowel gas pattern. 2. Contrast filled bladder, with numerous diverticula, consistent with chronic bladder outlet obstruction. Electronically Signed   By: Randa Ngo M.D.   On: 06/07/2020 00:53   CT Angio Abd/Pel w/ and/or w/o  Result Date: 06/06/2020 CLINICAL DATA:  Metastatic neuroendocrine tumor, nausea and vomiting EXAM: CTA ABDOMEN AND PELVIS WITHOUT AND WITH CONTRAST TECHNIQUE: Multidetector CT imaging of the abdomen and pelvis was performed using the standard protocol during bolus administration of intravenous contrast. Multiplanar reconstructed images and MIPs were obtained and reviewed to evaluate the vascular anatomy. CONTRAST:  142m OMNIPAQUE IOHEXOL 350 MG/ML SOLN COMPARISON:  06/05/2020 FINDINGS: VASCULAR There is suboptimal systemic arterial opacification due to timing of the contrast bolus. Aorta: Normal caliber aorta without aneurysm, dissection, vasculitis or significant stenosis. Celiac: Patent without evidence of aneurysm, dissection, vasculitis or significant stenosis. SMA: The SMA is widely patent at its origin. Branches of the SMA are  suboptimally evaluated due to timing of the contrast bolus. There is encasement of the distal SMA by the central mesenteric neuroendocrine malignancy. Renals: Both renal arteries are patent without evidence of aneurysm, dissection, vasculitis, fibromuscular dysplasia or significant stenosis. IMA: Patent without evidence of aneurysm, dissection, vasculitis or significant stenosis. Evaluation of the IMA is limited due to timing of the contrast bolus. Inflow: Patent without evidence of aneurysm, dissection, vasculitis or significant stenosis. Proximal Outflow: Bilateral common femoral and visualized portions of the superficial and profunda femoral arteries are patent without evidence of aneurysm, dissection, vasculitis or significant stenosis. Veins: The IVC is significantly effaced due to the numerous hepatic metastases described previously. Distal venous structures are unremarkable. Review of the MIP images confirms the above findings. NON-VASCULAR Lower chest: Small bilateral pleural effusions have developed in the interim. Hypoventilatory changes are seen within the dependent lower lobes. Hepatobiliary: Diffuse hepatic metastases are again identified, unchanged. Gallbladder is decompressed with minimal gallbladder sludge. No evidence of cholecystitis. Pancreas: Marked pancreatic atrophy without focal abnormality. Spleen: Normal in size without focal abnormality. Adrenals/Urinary Tract: Stable left renal cyst. The kidneys enhance normally. Excreted contrast from previous CT fills the bladder lumen, with multiple large bladder diverticula consistent with chronic bladder outlet obstruction. No filling defect. The adrenals are unremarkable. Stomach/Bowel: Since the prior exam, there is progressive bowel wall thickening of the distal jejunum and ileum. No evidence of pneumatosis. Differential diagnosis includes progressive enteritis versus mesenteric ischemic change. Given elevated lactic acid level, ischemia is  favored. There is no evidence of bowel obstruction. Colon is relatively decompressed. Lymphatic: Multiple subcentimeter retroperitoneal lymph nodes are seen within the upper abdomen, stable. No pathologically enlarged lymph nodes. Reproductive: Stable prostate enlargement. Other: Spiculated central mesenteric mass again identified compatible with known neuroendocrine malignancy.  This mass measures approximately 4.3 x 2.9 by 5.4 cm. Small volume ascites again identified, stable. Musculoskeletal: No acute displaced fractures. Extravasated contrast is seen within the soft tissues of the left upper arm. Reconstructed images demonstrate no additional findings. IMPRESSION: VASCULAR 1. Limited CTA due to timing of the contrast bolus. 2. Encasement of the distal SMA by the known neuroendocrine mass within the central mesentery. Evaluation of the branches beyond this level are limited due to timing of contrast bolus. 3. Otherwise unremarkable CTA. NON-VASCULAR 1. Progressive bowel wall thickening involving the distal jejunum and ileum. While progressive enteritis could give this appearance, bowel ischemia is suspected given patient's known elevated lactate level. This is likely due to involvement of the distal SMA branches by the spiculated neuroendocrine malignancy within the central mesentery. 2. Stable spiculated central mesenteric mass compatible with known malignancy. Stable diffuse metastases throughout the liver. 3. Trace ascites. 4. Small bilateral pleural effusions, new since previous exam. 5. Stable sequela of chronic bladder outlet obstruction due to an enlarged prostate. Electronically Signed   By: Randa Ngo M.D.   On: 06/06/2020 22:03   US Abdomen Limited RUQ  Result Date: 06/06/2020 CLINICAL DATA:  Elevated LFTs EXAM: ULTRASOUND ABDOMEN LIMITED RIGHT UPPER QUADRANT COMPARISON:  Abdominal CT from yesterday FINDINGS: Gallbladder: Polypoid mass measuring 4 mm.  No calculi or focal tenderness. Common bile  duct: Not visualized.  No intrahepatic bile duct dilatation seen Liver: Innumerable metastases with diffuse heterogeneity of the liver, which is lobulated by CT. Small volume ascites noted in the right upper quadrant. Portal vein is patent on color Doppler imaging with normal direction of blood flow towards the liver. IMPRESSION: 1. Widespread hepatic metastatic disease. 2. 4 mm gallbladder polyp. Electronically Signed   By: Monte Fantasia M.D.   On: 06/06/2020 11:10   CT images reviewed  Medications: I have reviewed the patient's current medications.  Assessment/Plan: 1.Metastatic carcinoid tumor, biopsy of a liver lesion 10/23/2017 consistent with a well-differentiated neuroendocrine neoplasm, WHO grade 2,[id number]%  CT abdomen/pelvis 10/13/2017-extensive liver metastases, cirrhosis, soft tissue mass in the small bowel mesentery versus a primary small bowel tumor, tiny lucent lesions in the pelvic bones  Elevated chromogranin A and 24-hour urine 5-HIAA  CT chest 11/24/2017-subpleural nodule in the left lower lobe with associated radiotracer activity on comparison DOTATATEPET scan. No additional evidence of thoracic metastasis.  DOTATATE PET scan3/01/2018-intense radiotracer accumulation with innumerable confluent hepatic metastasis; intense radiotracer activity within central mesenteric mass; 2 foci of uptake associated with the small bowel; intense radiotracer activity associated periaortic and paraspinal lymph nodes; more distant solitary small metastasis within the pleural space left lower lobe.  Monthly Sandostatin initiated 11/26/2017, dose increased to 30 mg 01/21/2018  Cycle 1 Lutathera 03/03/2018, monthly Sandostatin continued  Cycle 2 Lutathera 04/27/2018, monthly Sandostatin continued  Cycle 3 Lutathera 06/23/2018  Cycle 4 Lutathera 08/18/2018  Restaging dotatate PET scan 09/29/2018-multiple lesions again demonstrated within the liver which accumulate the radiotracer. The number of  lesions which accumulate the tracer has decreased visually compared to the prior exam. Some lesions are no longer evident along the right hepatic margin. Remaining measurable lesions have radiotracer activity similar to prior. No new lesions present. Central mesenteric mass unchanged and remains avid for radiotracer. Adjacent lesion within the small bowel SUV max equal 10.5 compared to SUV max equal 15.8. Smaller lesion previously identified in the upper pelvis small bowel appears decreased in activity. No new metastatic lesions. Lesion position between the right psoas muscle and spine SUV max equal  to 21.8 compared with SUV max equal 26.2.  Dotatate PET 10/19/2019-no evidence of disease progression, stable multifocal hepatic metastases, unchanged from post therapy scan 03/30/2019 and improved from pretherapy scan5/01/2018. Stable mesenteric mass, improved retroperitoneal lymph node adjacent to the celiac trunk, solitary small bowel lesion unchanged  Monthly Sandostatin continued  01/26/2020 chromogranin A level stable elevation  CT abdomen/pelvis 06/05/2020-slight enlargement of central mesenteric mass, extensive hepatic metastases-unchanged, bowel wall thickening in central small bowel loops and right colon  2.Diarrhea-likely secondary to carcinoid syndrome, improved with Sandostatin 3.History of anorexia/weight loss-improved 4.Prostatic hypertrophy;status post laser vaporization 01/05/2018 5. Mild pancytopenia following Lutathera treatment 6.Thoracic dermatomal zoster rash 04/02/2019 treated with Valtrex 7.  Admission 06/05/2020 after a fall with failure to thrive, increased diarrhea, nausea, and abdominal pain 8.  Markedly elevated AST and CPK-likely rhabdomyolysis  Charles Schmidt appears stable today. He has nausea and ongoing diarrhea.  Suspect symptoms are related to progression of the carcinoid tumor/syndrome.  GI panel was negative but stool for C. difficile not yet  obtained.  We will need to consider changing to a different somatostatin analog, lanreotide, and repeat treatment with Lutathera if his performance status improves.  We will also need to have discussions regarding goals of care.  He will be a hospice candidate if his condition does not improve with control of the diarrhea.  CK markedly elevated likely due to rhabdomyolysis.  LFTs trending downward.  Recommendations: 1.  Check stool for C. Difficile 2.  Begin Imodium and Lomotil for diarrhea if the C. difficile returns negative 3.  Can add octreotide for diarrhea if not controlled with Imodium and Lomotil 4.  Consider switch to long-acting lanreotide and repeat treatment with Lutathera as an outpatient 5.  Follow liver enzymes    LOS: 2 days   [name], NP   06/07/2020, 1:42 PM Charles Schmidt was interviewed and examined.  He reports feeling better today.  He continues to have diarrhea.  I suspect the generalized weakness and diarrhea are related to progression of the carcinoid tumor.  If the C. difficile returns negative I recommend starting Imodium and Lomotil.  We will add octreotide for persistent diarrhea.  We will plan a change to lanreotide as an outpatient.  Charles Schmidt will be referred for hospice care and skilled nursing facility placement if his performance status does not improve over the next few days.  I discussed the case with his daughter-in-law at the bedside.  I will check on him 06/08/2020.

## 2020-06-07 NOTE — Progress Notes (Addendum)
FPTS Interim Progress Note  Goals of Care  Metastatic Carcinoid Tumor to liver + Mesenteric Tumor : - Palliative consulted for [place] discussion given multiorgan failure - patient's oncologist following, appreciate recs  AKI in setting of likely hypotension with fall, contrast exposure, rhabdo, and urinary obstruction 2/2 BPH:  - Nephro consulted for help with electrolyte derangements - continue fluid repletion at 200cc/hr, s/p additional 1L NS bolus - Foley placed with good urine output  Tachycardia and Hypotension: - continue aggressive fluid hydration - transition to progressive floor - CXR with L>R basilar airspace disease, atelectasis vs PNA. No O2 requirement or respiratory symptoms. GI work up thus far negative. CK/Trop improving.  - consider blood cultures, UA, CXR, antibiotics if continues to decline or develops fever - if develops respiratory changes, consider CTA to rule out PE given hypercoagulable state although has been on Lovenox for DVT ppx since admission  Will continue to monitor closely.  Mina Marble [place], DO 06/07/2020, 2:13 PM PGY-3, [place] [facility] pager [phone]

## 2020-06-07 NOTE — Progress Notes (Addendum)
FPTS Interim Progress Note  S: Went to bedside after being called for troponin of 156.  This was an increase from 10 on 9/14 at 10 AM.  Patient does not have any complaints at this time.  Specifically denies chest and abdominal pain.  O: BP 96/75 (BP Location: Left Arm)   Pulse (!) 123   Temp 98 F (36.7 C) (Oral)   Resp 18   SpO2 94%   General: Ill-appearing, generally unchanged from yesterday's exam in the emergency department.  Overall appears tired but is alert to conversation. Chest: Tachycardia.  No murmurs appreciated.  No lower extremity edema. Lungs: Clear to auscultation anteriorly. Abdomen: NTTP. Size of abdomen unchanged from yesterday. No tympany. No fluid shift. Positive bowel sounds. MSK: Extremities are cool to touch (patient without any blankets/covers).  Patient has 2+ DPs.  His extremities are moving spontaneously. Neuro: Alert and oriented x4.  No focal neurologic deficits.  Cranial nerves appear grossly intact.  A/P: This is a 79 year old male who presented with weakness in setting of vomiting and diarrhea.  His medical history significant for carcinoid tumor with multiple metastases to the liver.    CT A/P obtained for further evaluation of patient's presentation and resulted earlier this evening, unfortunately was limited due to timing of contrast bolus. There is encasement of the distal SMA by neuroendocrine mass, however, her branches distal are limited due to timing of contrast bolus. CT also showed progressive bowel wall thickening of the distal jejunum and ileum concerning for progressive enteritis versus bowel ischemia in the setting of patient's known lactate level. Also supported by distal SMA branches with spiculated neuroendocrine malignancy. Stable mets throughout the liver. CTA also with small b/l pleural effusions, new since previous exam.    On arrival to bedside, patient's vital signs significant for blood pressure 96/75 with pulse of 123. This is an  acute change from earlier vital signs. However, patient's MAPs stable from high 70's-80's. Physical exam unchanged from yesterday with no abdominal pain and 2+ Dps. Obtained CXR: low lung volumes with basilar atelectasis and increased central vascular congestion.Abdominal X ray w/o abnormalities and also contrast.   Differential for increasing troponin 1. ACS: Less likely as patient denies any chest pain and EKG unchanged from earlier with the exception of sinus tachycardia.  2. Elevated CK resulted 6,856 from 147. Which is most likely cause of elevated troponin.  3. Pleural effusions/pulm edema on CXR & CTA, however, no BLEE and no hypoxemia/O2 requirement. No BLEE 4. GI bleed: CBC obtained and Hgb 12.6, 2+ Dps  5. Acute neurological process unlikely  as FND and CN intact.  6. Also unlikely: Dissection, myocarditis/pericarditis, renal failure all unlikely given objective data.   Elevated CK  Can consider mesenteric ischemia as suggested by CT; however, patient's physical exam is not consistent with this. Though, per day team, discussed that pt was poor surgical candidate. Other causes include electrolyte abnormalities, malignancy, kidney disease, viral illness, myopathy (weakness was one of patient's presenting symptoms)  Given stability at this time, will hold on calling surgery.   Trend CK   Will also obtain move up AM labs to evaulate LFT and other electrolytes (Currently pending).   Will continue fluids at 150cc/hr and watch O2 sats carefully with continuous pulse ox given mild pulm edema.  Patient also placed on telemetry.   Monitor vital signs closely. Spoke to charge RN about our concerns for this patient and reasons to call MD.    GIPP returned negative and contact  precautions discontinued.   Wilber Oliphant, MD 06/07/2020, 1:22 AM PGY-3, [place] pager [phone]

## 2020-06-07 NOTE — Consult Note (Signed)
[facility] Schmidt Admit Date: 06/05/2020 06/07/2020 [facility] Requesting Physician:  Charles Deutscher MD  Reason for Consult:  AKI HPI:  54M admitted 9/14 after presenting with nausea/vomiting/diarrhea/weakness/falls.  PMH Incudes:  Metastatic carcinoid tumor followed by Dr. Benay Schmidt maintained on monthly Sandostatin and with chronic diarrhea  History of BPH with laser vaporization 12/2017  Normal GFR  Patient seen in the room, he is somnolent and not participatory.  His [name] provides the history.  He had developed acute on chronic diarrhea which was progressive and eventually went to the fall, for which he presented.  At presentation his creatinine was at baseline and has worsened to 2.5 today.  He has received 2 CT scans with IV contrast one on 9/14 and another one yesterday evening.  These have commented on no evidence of hydronephrosis.  But he does have evidence of chronic bladder outlet obstruction with bladder diverticulum.  Earlier today he had high postvoid residuals and had a Foley catheter placed with significant urine output.  Oncology and surgery are following.  He has been found to have elevated CK levels with a peak value of nearly 7000 yesterday, improved to 4900 today.  He has had significant increase in LFTs, predominantly AST.  Total bilirubin has been elevated as well.  Currently he is treated with normal saline IV at 200 mL/h.  K today was 4.2.  Phosphorus 7.7.  Blood pressures are soft.   Creatinine (mg/dL)  Date Value  03/27/2020 1.02  04/12/2019 1.05  09/16/2018 0.98  07/20/2018 1.02  05/25/2018 0.94  03/31/2018 0.98  10/28/2017 0.91   Creatinine, Ser (mg/dL)  Date Value  06/07/2020 2.53 (H)  06/07/2020 2.05 (H)  06/06/2020 1.09  06/05/2020 1.20  08/18/2018 0.95  06/23/2018 0.86  04/27/2018 0.93  03/03/2018 0.95  01/05/2018 0.80  11/22/2017 0.89  ] I/Os: I/O last 3 completed shifts: In: 800 [I.V.:800] Out: 100 [Urine:100]   ROS NSAIDS: Denies  exposure IV Contrast as above, 2 exposures TMP/SMX no exposure Hypotension not present Balance of 12 systems is negative w/ exceptions as above  PMH  Past Medical History:  Diagnosis Date  . Bladder diverticulum 06/06/2020  . BPH with obstruction/lower urinary tract symptoms   . Cataract, mature    12-28-2017  per pt right eye, legally blind  . Diarrhea concurrent with and due to carcinoid syndrome ([facility])    12-28-2017 improvement since started Sandostatin monthly  . ED (erectile dysfunction)   . Elevated PSA   . Family history of cancer in son   . Family history of prostate cancer   . Foley catheter in place   . Glaucoma, left eye   . History of colon polyps   . History of urinary retention    2015; 2016; 01/ 2019  . Hypertension   . Metastatic malignant neuroendocrine tumor to liver [facility]) oncoloigst-  dr Charles Schmidt   dx 10-23-2017 by liver bx-- WHO grade 2; small bowel mesenteric mass, cirrhosis, mets left lower lobe solitary nodule ,  periaortic and paraspinal lymph nodes METS--  started monthly Sandostatin 11-20-2017  . Microcytic anemia   . RBBB (right bundle branch block with left anterior fascicular block)   . Urinary retention   . Venous reflux    PSH  Past Surgical History:  Procedure Laterality Date  . INGUINAL HERNIA REPAIR  1990s   unsure side  . IR US GUIDE BX ASP/DRAIN  10/23/2017  . QUADRICEPS TENDON REPAIR Left 01-08-2011   dr Charles Schmidt  . THULIUM LASER TURP (TRANSURETHRAL  RESECTION OF PROSTATE) N/A 01/05/2018   Procedure: [facility] LASER OF THE PROSTATE;  Surgeon: Charles Aloe, MD;  Location: [facility];  Service: Urology;  Laterality: N/A;  . TONSILLECTOMY  child   FH  Family History  Problem Relation Age of Onset  . Hypertension Mother   . Stroke Father   . Prostate cancer Father        'slow growing'  . Kidney Stones Sister   . Cancer Son 20       neuroendocrine tumors of small intestine   SH  reports that he has never smoked. He has  never used smokeless tobacco. He reports previous alcohol use of about 1.0 standard drink of alcohol per week. He reports that he does not use drugs. Allergies No Known Allergies Home medications Prior to Admission medications   Medication Sig Start Date End Date Taking? Authorizing Provider  amLODipine (NORVASC) 5 MG tablet Take 1 tablet (5 mg total) by mouth at bedtime. 05/01/18  Yes Charles Mau, NP  dorzolamide-timolol (COSOPT) 22.3-6.8 MG/ML ophthalmic solution Place 1 drop into the left eye 2 (two) times daily.    Yes [provider]  finasteride (PROSCAR) 5 MG tablet Take 5 mg by mouth at bedtime.    Yes [provider]  hydrochlorothiazide (HYDRODIURIL) 12.5 MG tablet Take 12.5 mg by mouth daily. 11/03/18  Yes [provider]  loperamide (IMODIUM) 2 MG capsule Take 2-4 mg by mouth 4 (four) times daily as needed for diarrhea or loose stools.    Yes [provider]  Multiple Vitamin (MULTIVITAMIN WITH MINERALS) TABS tablet Take 1 tablet by mouth at bedtime. Centrum Silver   Yes [provider]  potassium chloride (KLOR-CON) 10 MEQ tablet Take 20 mEq by mouth daily. 05/24/20  Yes [provider]  Saw Palmetto, Serenoa repens, (SAW PALMETTO PO) Take 2 tablets by mouth at bedtime.    Yes [provider]  tamsulosin (FLOMAX) 0.4 MG CAPS capsule Take 1 capsule (0.4 mg total) by mouth daily after supper. Patient taking differently: Take 0.4 mg by mouth at bedtime.  10/22/14  Yes Charles Fraise, MD  Travoprost, BAK Free, (TRAVATAN) 0.004 % SOLN ophthalmic solution Place 1 drop into the left eye at bedtime.    Yes [provider]  HYDROcodone-acetaminophen (NORCO/VICODIN) 5-325 MG tablet Take 1 tablet by mouth every 6 (six) hours as needed. Patient not taking: Reported on 08/08/2019 04/02/19   Charles Muskrat, MD    Current Medications Scheduled Meds: . calcium carbonate  1 tablet Oral TID WC  . Chlorhexidine Gluconate  Cloth  6 each Topical Daily  . enoxaparin (LOVENOX) injection  40 mg Subcutaneous Q24H   Continuous Infusions: . sodium chloride 200 mL/hr at 06/07/20 1018   PRN Meds:.ondansetron **OR** ondansetron (ZOFRAN) IV  CBC Recent Labs  Lab 06/05/20 0957 06/06/20 0537 06/07/20 0134  WBC 4.0 5.6 4.3  NEUTROABS 2.2 4.7 3.3  HGB 12.2* 12.8* 12.6*  HCT 37.2* 38.2* 36.1*  MCV 79.5* 76.6* 74.6*  PLT 198 168 818*   Basic Metabolic Panel Recent Labs  Lab 06/05/20 0957 06/06/20 0537 06/07/20 0134 06/07/20 1143  NA 141 141 140 145  K 3.6 4.7 3.9 4.2  CL 106 108 111 115*  CO2 20* 21* 12* 14*  GLUCOSE 184* 111* 78 64*  BUN 10 12 23  27*  CREATININE 1.20 1.09 2.05* 2.53*  CALCIUM 8.5* 8.6* 7.4* 7.1*  PHOS  --   --  7.0* 7.7*    Physical  Exam  Blood pressure (!) 86/59, pulse (!) 113, temperature 97.9 F (36.6 C), temperature source Axillary, resp. rate 18, height 6\' 4"  (1.93 m), weight 100.1 kg, SpO2 93 %. GEN: Chronically ill-appearing, sleeping, NAD ENT: NCAT EYES: EOMI CV: Tachycardic, regular, no rub PULM: Coarse breath sounds bilaterally ABD: Mild distention, soft SKIN: No rashes EXT: No significant edema  Assessment 28M AKI likely as a consequence of acute on chronic bladder outlet obstruction, intravenous contrast exposure twice, rhabdomyolysis, presenting with hypovolemia.  1. AKI, BL SCr aroudn 1.0.  Etiology likley contrast + obstruction + rhabdo + hyopvolemia.  Nonoliguric.  K ok.  Negative imaging 2. Metabolic acidosis, AG 16 likely from #1 and #4 3. Metastatic carcinoid tumor 4. AoC Diarrhe likely from #3 5. Hyperphosphatemia 6. Mild rhabdomyolysis 7. Inc LFTs, esp AST makes me wonder if is not entirely hepatic in etiology 8. Chronic BOO, foley in place  Plan  hange NS to LR  Keep foley catheter  Follow UOP and SCr over time  Would not be candidate for HD with problem #3  Daily weights, Daily Renal Panel, Strict I/Os, Avoid nephrotoxins (NSAIDs, judicious  IV Contrast)   Would monitor phos, no meds needed  Charles Schmidt  06/07/2020, 2:40 PM

## 2020-06-07 NOTE — Progress Notes (Addendum)
Discussed with surgery consult PA today who reported that sign out was that patient is poor candidate for surgery, no further work up from their perspective. PA will close loop with Dr. Bobbye Morton when she is out of surgery to confirm sign off.  Edit: 2:21 PM Dr. Bobbye Morton confirmed sign off.  Gladys Damme, MD [facility], PGY-2

## 2020-06-08 ENCOUNTER — Inpatient Hospital Stay (HOSPITAL_COMMUNITY): Payer: Medicare Other

## 2020-06-08 DIAGNOSIS — Z7189 Other specified counseling: Secondary | ICD-10-CM

## 2020-06-08 DIAGNOSIS — Z515 Encounter for palliative care: Secondary | ICD-10-CM

## 2020-06-08 DIAGNOSIS — Z66 Do not resuscitate: Secondary | ICD-10-CM

## 2020-06-08 DIAGNOSIS — N179 Acute kidney failure, unspecified: Secondary | ICD-10-CM

## 2020-06-08 DIAGNOSIS — R1115 Cyclical vomiting syndrome unrelated to migraine: Secondary | ICD-10-CM | POA: Diagnosis not present

## 2020-06-08 LAB — HEPATIC FUNCTION PANEL
ALT: 84 U/L — ABNORMAL HIGH (ref 0–44)
AST: 259 U/L — ABNORMAL HIGH (ref 15–41)
Albumin: 1.9 g/dL — ABNORMAL LOW (ref 3.5–5.0)
Alkaline Phosphatase: 273 U/L — ABNORMAL HIGH (ref 38–126)
Bilirubin, Direct: 0.7 mg/dL — ABNORMAL HIGH (ref 0.0–0.2)
Indirect Bilirubin: 0.8 mg/dL (ref 0.3–0.9)
Total Bilirubin: 1.5 mg/dL — ABNORMAL HIGH (ref 0.3–1.2)
Total Protein: 5.7 g/dL — ABNORMAL LOW (ref 6.5–8.1)

## 2020-06-08 LAB — RENAL FUNCTION PANEL
Albumin: 1.9 g/dL — ABNORMAL LOW (ref 3.5–5.0)
Anion gap: 15 (ref 5–15)
BUN: 37 mg/dL — ABNORMAL HIGH (ref 8–23)
CO2: 15 mmol/L — ABNORMAL LOW (ref 22–32)
Calcium: 7 mg/dL — ABNORMAL LOW (ref 8.9–10.3)
Chloride: 112 mmol/L — ABNORMAL HIGH (ref 98–111)
Creatinine, Ser: 3.44 mg/dL — ABNORMAL HIGH (ref 0.61–1.24)
GFR calc Af Amer: 19 mL/min — ABNORMAL LOW (ref >60)
GFR calc non Af Amer: 16 mL/min — ABNORMAL LOW (ref >60)
Glucose, Bld: 92 mg/dL (ref 70–99)
Phosphorus: 9 mg/dL — ABNORMAL HIGH (ref 2.5–4.6)
Potassium: 3.9 mmol/L (ref 3.5–5.1)
Sodium: 142 mmol/L (ref 135–145)

## 2020-06-08 LAB — MAGNESIUM: Magnesium: 2.2 mg/dL (ref 1.7–2.4)

## 2020-06-08 LAB — GLUCOSE, CAPILLARY: Glucose-Capillary: 136 mg/dL — ABNORMAL HIGH (ref 70–99)

## 2020-06-08 LAB — CK: Total CK: 4054 U/L — ABNORMAL HIGH (ref 49–397)

## 2020-06-08 MED ORDER — SODIUM BICARBONATE 650 MG PO TABS
1300.0000 mg | ORAL_TABLET | Freq: Two times a day (BID) | ORAL | Status: DC
  Administered 2020-06-08 – 2020-06-10 (×5): 1300 mg via ORAL
  Filled 2020-06-08 (×5): qty 2

## 2020-06-08 MED ORDER — OCTREOTIDE ACETATE 50 MCG/ML IJ SOLN
75.0000 ug | Freq: Three times a day (TID) | INTRAMUSCULAR | Status: DC
  Administered 2020-06-08 – 2020-06-11 (×9): 75 ug via SUBCUTANEOUS
  Filled 2020-06-08 (×10): qty 1.5

## 2020-06-08 MED ORDER — SEVELAMER CARBONATE 800 MG PO TABS
800.0000 mg | ORAL_TABLET | Freq: Three times a day (TID) | ORAL | Status: DC
  Administered 2020-06-08 – 2020-06-15 (×18): 800 mg via ORAL
  Filled 2020-06-08 (×18): qty 1

## 2020-06-08 NOTE — Progress Notes (Signed)
Bladder scan 60ml.

## 2020-06-08 NOTE — Progress Notes (Signed)
[employer])  Received request from [place], NP with PMT, to begin discussion with family about hospice services in the community. Hospital liaison spoke with son [name] to initiate education related to hospice philosophy and services and to answer any questions at this time.  This liaison made clear that at this point plan as explained by Sharyn Lull is to wait to reassess kidney function; [name] verbalized understanding. Discussion had about what hospice support would look like in a SNF or nursing home setting vs care at an inpt residential hospice facility like [facility].  Mark shared preference for [facility] if or when hospital MDs feel his father is appropriate for such care.  Plan made for hospital liaisons to follow up with family in the next days.  ACC information and contact numbers given to [facility].  Above information shared with [place], [place].  Please call with any questions or concerns.  Thank you for the opportunity to participate in this pt's care.  Domenic Moras, BSN, RN [facility] [phone] (24h on call)

## 2020-06-08 NOTE — Evaluation (Signed)
Occupational Therapy Evaluation Patient Details Name: Charles Schmidt MRN: [id number] DOB: 1941/04/04 Today's Date: 06/08/2020    History of Present Illness Charles Schmidt is a 79 y.o. male presenting with weakness in the setting of vomiting and diarrhea. PMH is significant for metastatic carcinoid tumor, BPH s/p laser vaporization, and HTN.  On precautions for Cdiff.   Clinical Impression   Pt admitted with the above. Pt currently with functional limitations due to the deficits listed below (see OT Problem List).  Pt will benefit from skilled OT to increase their safety and independence with ADL and functional mobility for ADL to facilitate discharge to venue listed below.   Daughter present and encouraging.  Pt will need increased rehab prior to ALF.    Follow Up Recommendations  SNF (per PT ultimate goal is ALF but will likely need ST SNF prior to ALF)    Equipment Recommendations  None recommended by OT    Recommendations for Other Services       Precautions / Restrictions Precautions Precautions: Other (comment);Fall (Cdiff) Restrictions Weight Bearing Restrictions: No      Mobility Bed Mobility Overal bed mobility: Needs Assistance Bed Mobility: Supine to Sit     Supine to sit: Mod assist;HOB elevated Sit to supine: Max assist;+2 for physical assistance   General bed mobility comments: Needed mod assist to raise trunk and sit EOB with assist for LEs as well.  Transfers Overall transfer level: Needs assistance Equipment used: 2 person hand held assist Transfers: Sit to/from [employer] Transfers Sit to Stand: Mod assist;Min assist;+2 safety/equipment;From elevated surface Stand pivot transfers: +2 safety/equipment;From elevated surface;Max assist       General transfer comment: increased A needed from chair to bed    Balance Overall balance assessment: Needs assistance Sitting-balance support: No upper extremity supported;Feet  supported Sitting balance-Leahy Scale: Fair     Standing balance support: Bilateral upper extremity supported;During functional activity Standing balance-Leahy Scale: Poor Standing balance comment: Pt needed UE support for balance                           ADL either performed or assessed with clinical judgement   ADL Overall ADL's : Needs assistance/impaired Eating/Feeding: Moderate assistance;Sitting   Grooming: Moderate assistance;Sitting   Upper Body Bathing: Moderate assistance;Sitting   Lower Body Bathing: +2 for physical assistance;+2 for safety/equipment;Total assistance   Upper Body Dressing : Moderate assistance;Sitting   Lower Body Dressing: +2 for physical assistance;+2 for safety/equipment;Sit to/from stand;Cueing for safety;Cueing for compensatory techniques   Toilet Transfer: Maximal assistance;+2 for physical assistance;+2 for safety/equipment;Cueing for safety;Cueing for sequencing;Squat-pivot Toilet Transfer Details (indicate cue type and reason): chair to bed Toileting- Clothing Manipulation and Hygiene: +2 for physical assistance;+2 for safety/equipment;Total assistance                            Pertinent Vitals/Pain Pain Assessment: No/denies pain     Hand Dominance Right   Extremity/Trunk Assessment Upper Extremity Assessment Upper Extremity Assessment: Generalized weakness   Lower Extremity Assessment Lower Extremity Assessment: Generalized weakness   Cervical / Trunk Assessment Cervical / Trunk Assessment: Kyphotic   Communication Communication Communication: No difficulties   Cognition Arousal/Alertness: Awake/alert Behavior During Therapy: WFL for tasks assessed/performed Overall Cognitive Status: Within Functional Limits for tasks assessed  Home Living Family/patient expects to be discharged to:: Private residence Living Arrangements:  Alone Available Help at Discharge: Family;Available PRN/intermittently Type of Home: House Home Access: Stairs to enter [employer] of Steps: 3 Entrance Stairs-Rails: Left Home Layout: One level     Bathroom Shower/Tub: Teacher, early years/pre: Standard     Home Equipment: Cane - single point;Bedside commode;Grab bars - tub/shower          Prior Functioning/Environment Level of Independence: Independent with assistive device(s)        Comments: B/D self        OT Problem List: Decreased strength;Impaired balance (sitting and/or standing);Decreased activity tolerance;Decreased safety awareness      OT Treatment/Interventions: Self-care/ADL training;Patient/family education;Therapeutic activities;Balance training    OT Goals(Current goals can be found in the care plan section) Acute Rehab OT Goals Patient Stated Goal: to go home OT Goal Formulation: With patient Time For Goal Achievement: 06/22/20  OT Frequency: Min 2X/week    AM-PAC OT "6 Clicks" Daily Activity     Outcome Measure Help from another person eating meals?: A Little Help from another person taking care of personal grooming?: A Little Help from another person toileting, which includes using toliet, bedpan, or urinal?: Total Help from another person bathing (including washing, rinsing, drying)?: A Lot Help from another person to put on and taking off regular upper body clothing?: A Lot Help from another person to put on and taking off regular lower body clothing?: Total 6 Click Score: 12   End of Session Nurse Communication: Mobility status  Activity Tolerance: Patient limited by fatigue Patient left: in bed;with call bell/phone within reach;with family/visitor present;with nursing/sitter in room  OT Visit Diagnosis: Unsteadiness on feet (R26.81);Muscle weakness (generalized) (M62.81);Other abnormalities of gait and mobility (R26.89)                Time: 4650-3546 OT Time  Calculation (min): 27 min Charges:  OT General Charges $OT Visit: 1 Visit OT Evaluation $OT Eval Moderate Complexity: 1 Mod OT Treatments $Self Care/Home Management : 8-22 mins  Kari Baars, OT Acute Rehabilitation Services [phone] Office- [phone], Edwena Felty D 06/08/2020, 2:44 PM

## 2020-06-08 NOTE — Progress Notes (Addendum)
IP PROGRESS NOTE   Subjective:   Remains weak. Had one stool today per nursing that was semi-formed. Has not had loose stool to be able to send stool for c diff.  Denies pain.    Objective: Vital signs in last 24 hours: Blood pressure 125/80, pulse 99, temperature 97.8 F (36.6 C), temperature source Oral, resp. rate 20, height 6' 4"  (1.93 m), weight 98.4 kg, SpO2 95 %.  Intake/Output from previous day: 09/16 0701 - 09/17 0700 In: 7082.8 [P.O.:120; I.V.:5462.6; IV Piggyback:1500.2] Out: 2736 [Urine:2736]  Physical Exam: HEENT: No thrush Lungs: Clear bilaterally Cardiac: Regular rate and rhythm Abdomen: Marked hepatomegaly extending into the left abdomen Extremities: No leg edema Neurologic: Alert, follows commands  Lab Results: Recent Labs    06/06/20 0537 06/07/20 0134  WBC 5.6 4.3  HGB 12.8* 12.6*  HCT 38.2* 36.1*  PLT 168 147*    BMET Recent Labs    06/07/20 1859 06/08/20 0259  NA 142 142  K 4.2 3.9  CL 113* 112*  CO2 13* 15*  GLUCOSE 86 92  BUN 34* 37*  CREATININE 3.01* 3.44*  CALCIUM 7.1* 7.0*    No results found for: CEA1  Studies/Results: DG Chest Portable 1 View  Result Date: 06/07/2020 CLINICAL DATA:  Tachycardia EXAM: PORTABLE CHEST 1 VIEW COMPARISON:  06/07/2020, PET CT 10/19/2019 FINDINGS: Low lung volumes. Possible trace pleural effusions. Similar left greater than right basilar airspace disease. Stable cardiomediastinal silhouette. No pneumothorax. IMPRESSION: Low lung volumes with similar left greater than right basilar airspace disease, atelectasis versus pneumonia. Electronically Signed   By: Donavan Foil M.D.   On: 06/07/2020 01:26   DG CHEST PORT 1 VIEW  Result Date: 06/07/2020 CLINICAL DATA:  Elevated troponin, abdominal pain EXAM: PORTABLE CHEST 1 VIEW COMPARISON:  06/05/2020 FINDINGS: Single frontal view of the chest demonstrates a stable cardiac silhouette. There is chronic elevation of the right hemidiaphragm. Lung volumes remain  diminished, with increased central vascular congestion and interstitial prominence since prior study. There is patchy consolidation at the left lung base, with trace left pleural effusion. No pneumothorax. IMPRESSION: 1. Low lung volumes, with basilar areas of atelectasis. 2. Increased central vascular congestion. 3. Trace left pleural effusion. Electronically Signed   By: Randa Ngo M.D.   On: 06/07/2020 00:51   DG Abd Portable 1V  Result Date: 06/07/2020 CLINICAL DATA:  Abdominal pain, elevated troponin EXAM: PORTABLE ABDOMEN - 1 VIEW COMPARISON:  06/06/2020 FINDINGS: 2 supine frontal views of the abdomen and pelvis demonstrates excreted contrast within a distended bladder. Multiple bladder diverticula are compatible with chronic bladder outlet obstruction. No evidence of bowel obstruction or ileus. No acute bony abnormalities. IMPRESSION: 1. Unremarkable bowel gas pattern. 2. Contrast filled bladder, with numerous diverticula, consistent with chronic bladder outlet obstruction. Electronically Signed   By: Randa Ngo M.D.   On: 06/07/2020 00:53   CT Angio Abd/Pel w/ and/or w/o  Result Date: 06/06/2020 CLINICAL DATA:  Metastatic neuroendocrine tumor, nausea and vomiting EXAM: CTA ABDOMEN AND PELVIS WITHOUT AND WITH CONTRAST TECHNIQUE: Multidetector CT imaging of the abdomen and pelvis was performed using the standard protocol during bolus administration of intravenous contrast. Multiplanar reconstructed images and MIPs were obtained and reviewed to evaluate the vascular anatomy. CONTRAST:  15m OMNIPAQUE IOHEXOL 350 MG/ML SOLN COMPARISON:  06/05/2020 FINDINGS: VASCULAR There is suboptimal systemic arterial opacification due to timing of the contrast bolus. Aorta: Normal caliber aorta without aneurysm, dissection, vasculitis or significant stenosis. Celiac: Patent without evidence of aneurysm, dissection, vasculitis or  significant stenosis. SMA: The SMA is widely patent at its origin. Branches of  the SMA are suboptimally evaluated due to timing of the contrast bolus. There is encasement of the distal SMA by the central mesenteric neuroendocrine malignancy. Renals: Both renal arteries are patent without evidence of aneurysm, dissection, vasculitis, fibromuscular dysplasia or significant stenosis. IMA: Patent without evidence of aneurysm, dissection, vasculitis or significant stenosis. Evaluation of the IMA is limited due to timing of the contrast bolus. Inflow: Patent without evidence of aneurysm, dissection, vasculitis or significant stenosis. Proximal Outflow: Bilateral common femoral and visualized portions of the superficial and profunda femoral arteries are patent without evidence of aneurysm, dissection, vasculitis or significant stenosis. Veins: The IVC is significantly effaced due to the numerous hepatic metastases described previously. Distal venous structures are unremarkable. Review of the MIP images confirms the above findings. NON-VASCULAR Lower chest: Small bilateral pleural effusions have developed in the interim. Hypoventilatory changes are seen within the dependent lower lobes. Hepatobiliary: Diffuse hepatic metastases are again identified, unchanged. Gallbladder is decompressed with minimal gallbladder sludge. No evidence of cholecystitis. Pancreas: Marked pancreatic atrophy without focal abnormality. Spleen: Normal in size without focal abnormality. Adrenals/Urinary Tract: Stable left renal cyst. The kidneys enhance normally. Excreted contrast from previous CT fills the bladder lumen, with multiple large bladder diverticula consistent with chronic bladder outlet obstruction. No filling defect. The adrenals are unremarkable. Stomach/Bowel: Since the prior exam, there is progressive bowel wall thickening of the distal jejunum and ileum. No evidence of pneumatosis. Differential diagnosis includes progressive enteritis versus mesenteric ischemic change. Given elevated lactic acid level,  ischemia is favored. There is no evidence of bowel obstruction. Colon is relatively decompressed. Lymphatic: Multiple subcentimeter retroperitoneal lymph nodes are seen within the upper abdomen, stable. No pathologically enlarged lymph nodes. Reproductive: Stable prostate enlargement. Other: Spiculated central mesenteric mass again identified compatible with known neuroendocrine malignancy. This mass measures approximately 4.3 x 2.9 by 5.4 cm. Small volume ascites again identified, stable. Musculoskeletal: No acute displaced fractures. Extravasated contrast is seen within the soft tissues of the left upper arm. Reconstructed images demonstrate no additional findings. IMPRESSION: VASCULAR 1. Limited CTA due to timing of the contrast bolus. 2. Encasement of the distal SMA by the known neuroendocrine mass within the central mesentery. Evaluation of the branches beyond this level are limited due to timing of contrast bolus. 3. Otherwise unremarkable CTA. NON-VASCULAR 1. Progressive bowel wall thickening involving the distal jejunum and ileum. While progressive enteritis could give this appearance, bowel ischemia is suspected given patient's known elevated lactate level. This is likely due to involvement of the distal SMA branches by the spiculated neuroendocrine malignancy within the central mesentery. 2. Stable spiculated central mesenteric mass compatible with known malignancy. Stable diffuse metastases throughout the liver. 3. Trace ascites. 4. Small bilateral pleural effusions, new since previous exam. 5. Stable sequela of chronic bladder outlet obstruction due to an enlarged prostate. Electronically Signed   By: Randa Ngo M.D.   On: 06/06/2020 22:03   CT images reviewed  Medications: I have reviewed the patient's current medications.  Assessment/Plan: 1.Metastatic carcinoid tumor, biopsy of a liver lesion 10/23/2017 consistent with a well-differentiated neuroendocrine neoplasm, WHO grade 2,ki-6710%  CT  abdomen/pelvis 10/13/2017-extensive liver metastases, cirrhosis, soft tissue mass in the small bowel mesentery versus a primary small bowel tumor, tiny lucent lesions in the pelvic bones  Elevated chromogranin A and 24-hour urine 5-HIAA  CT chest 11/24/2017-subpleural nodule in the left lower lobe with associated radiotracer activity on comparison DOTATATEPET scan. No  additional evidence of thoracic metastasis.  DOTATATE PET scan3/01/2018-intense radiotracer accumulation with innumerable confluent hepatic metastasis; intense radiotracer activity within central mesenteric mass; 2 foci of uptake associated with the small bowel; intense radiotracer activity associated periaortic and paraspinal lymph nodes; more distant solitary small metastasis within the pleural space left lower lobe.  Monthly Sandostatin initiated 11/26/2017, dose increased to 30 mg 01/21/2018  Cycle 1 Lutathera 03/03/2018, monthly Sandostatin continued  Cycle 2 Lutathera 04/27/2018, monthly Sandostatin continued  Cycle 3 Lutathera 06/23/2018  Cycle 4 Lutathera 08/18/2018  Restaging dotatate PET scan 09/29/2018-multiple lesions again demonstrated within the liver which accumulate the radiotracer. The number of lesions which accumulate the tracer has decreased visually compared to the prior exam. Some lesions are no longer evident along the right hepatic margin. Remaining measurable lesions have radiotracer activity similar to prior. No new lesions present. Central mesenteric mass unchanged and remains avid for radiotracer. Adjacent lesion within the small bowel SUV max equal 10.5 compared to SUV max equal 15.8. Smaller lesion previously identified in the upper pelvis small bowel appears decreased in activity. No new metastatic lesions. Lesion position between the right psoas muscle and spine SUV max equal to 21.8 compared with SUV max equal 26.2.  Dotatate PET 10/19/2019-no evidence of disease progression, stable multifocal hepatic  metastases, unchanged from post therapy scan 03/30/2019 and improved from pretherapy scan5/01/2018. Stable mesenteric mass, improved retroperitoneal lymph node adjacent to the celiac trunk, solitary small bowel lesion unchanged  Monthly Sandostatin continued  01/26/2020 chromogranin A level stable elevation  CT abdomen/pelvis 06/05/2020-slight enlargement of central mesenteric mass, extensive hepatic metastases-unchanged, bowel wall thickening in central small bowel loops and right colon  2.Diarrhea-likely secondary to carcinoid syndrome, improved with Sandostatin 3.History of anorexia/weight loss-improved 4.Prostatic hypertrophy;status post laser vaporization 01/05/2018 5. Mild pancytopenia following Lutathera treatment 6.Thoracic dermatomal zoster rash 04/02/2019 treated with Valtrex 7.  Admission 06/05/2020 after a fall with failure to thrive, increased diarrhea, nausea, and abdominal pain 8.  Markedly elevated AST and CPK-likely rhabdomyolysis  Mr. Dolbow appears stable today. Stools less frequent and more formed.  Renal function worsened today likely due to chronic bladder outlet obstruction, rhabdomyolysis, hypokalemia, and recent IV contrast. Nephrology is following and patient receiving IVF.   For his metastatic carcinoid we will start octreotide 75 mcg every 8 hours.  May increase to 100 mcg every 8 hours if he has continued loose stools.  Goals of care discussion this a.m. per Dr. Benay Spice and palliative care team and family is interested in hospice if his renal function does not improve. They are interested in residential hospice. If he shows improvement in his renal function and performance status, we will need to consider changing to a different somatostatin analog, lanreotide, and repeat treatment with Lutathera.   Recommendations: 1.  Begin octreotide 75 mcg subcu every 8 hours.  May increase to 100 mcg subcu every 8 hours if loose stools persist. 2.  Management of AKI  per nephrology 3.  Discussed with family that if no improvement of his renal function he would be a candidate for hospice.  They have met with the palliative care team and hospice liaison and have expressed interest in residential hospice if he does not have improvement in his renal function or performance status. 4.  Will consider switch to long-acting lanreotide and repeat treatment with Lutathera as an outpatient if performance status improves.  Please call Oncology over the weekend for questions. Will check on him again 9/20 if he remains hospitalized.    LOS:  3 days   [name], NP   06/08/2020, 1:11 PM Mr. Ensminger was interviewed and examined.  His daughter-in-law was at the bedside when I saw him this morning.  He has evidence of progressive renal failure with limited urine output today.  The nephrology service is following him.  He has advanced metastatic carcinoid tumor.  The diarrhea is likely secondary to carcinoid syndrome.  I have a low clinical suspicion for C. difficile infection.  We will begin octreotide.  He says that he will agree to hospice care if his renal function and clinical status do not improve over the next several days.  I will check on him 06/11/2020.

## 2020-06-08 NOTE — Consult Note (Signed)
Palliative [facility] Inpatient Consult Note  Reason for consult:  Goals of Care  HPI:  Per intake H&P --> Charles Schmidt is a 79 y.o. male presenting with weakness in the setting of vomiting and diarrhea. PMH is significant for metastatic carcinoid tumor, BPH s/p laser vaporization, and HTN.  Palliative care was asked to get involved to aid in goals of care conversations in the setting of acute illness and declining health state. He is followed by OP Oncology - Charles Schmidt for metastatic carcinoid tumor.   Clinical Assessment/Goals of Care: I have reviewed medical records including EPIC notes, labs and imaging, received report from bedside RN, assessed the patient.    I met with Charles Schmidt and spoke to his daughter in law at bedside and called his son, Charles Schmidt on speaker phone to further discuss diagnosis prognosis, Charles Schmidt, EOL wishes, disposition and options.   I introduced Palliative [facility] as specialized medical care for people living with serious illness. It focuses on providing relief from the symptoms and stress of a serious illness. The goal is to improve quality of life for both the patient and the family.  I asked Charles Schmidt to tell me about himself. He stated that he was tired - he was not receptive to speaking much.  Hi daughter in law shares that he is from [place]. 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 He has three children - Charles Schmidt who lives in [place], Charles Schmidt who lives in [place], and Charles Schmidt who lives in [place]. He is a family man - he gets joy out of spending time with his family members. He is a faithful man and is a member of [facility] Schmidt.  In terms of physical functioning - prior to hospitalization Charles Schmidt was doing fairly well. He lives with roommates in his own home. He was able to complete all bADL's for himself.  I was met by Charles Schmidt at bedside. He had shared with Charles Schmidt that if his kidney function does not turn  around if would be useful to focus on quality of life and comfort. He expresses interest in hospice.  Thereafter, I described hospice as a service for patients for have a life expectancy of < 6 months. It preserves dignity and quality at the end phases of life. The focus changes from curative to symptom relief.   A detailed discussion was had today regarding advanced directives - there are none on file in [facility] though the family makes decisions together.    Concepts specific to code status, artifical feeding and hydration, continued IV antibiotics and rehospitalization was had.  I shared with Charles Schmidt that I would worry performing cardiopulmonary resuscitation or intubation in him would possibly cause prolongation of suffering and would not fix his underlying disease. He said "I don't want this". He was in agreement that he would not wish to have prolonged heroic interventions.   Discussed the importance of continued conversation with family and their  medical providers regarding overall plan of care and treatment options, ensuring decisions are within the context of the patients values and GOCs.  Decision Maker: [facility]) [phone]  SUMMARY OF RECOMMENDATIONS   DNAR/DNI  Appreciate Oncology input - If kidneys do not turn around ongoing conversations regarding comfort oriented care  Patient would want to be in a residential setting  TOC --> Consult to hospice to provide information to patients family  Code Status/Advance Care Planning: DNAR/DNI   Palliative Prophylaxis:   Oral Care, Diarrhea, turn  Q2H  Additional Recommendations (Limitations, Scope, Preferences):  Treat what is treatable   Psycho-social/Spiritual:   Desire for further Chaplaincy support: No  Additional Recommendations: Education on hospice care   Prognosis: Poor in the setting of metastatic disease - hospice appropriate  Discharge Planning: Unclear  PPS: 10-20%   This conversation/these  recommendations were discussed with patient primary care team, Dr.  Chauncey Reading  Time In: 0640 Time Out: 0750 Total Time: 70 Greater than 50%  of this time was spent counseling and coordinating care related to the above assessment and plan.  [place] Team Team Cell Phone: [phone] Please utilize secure chat with additional questions, if there is no response within 30 minutes please call the above phone number  Palliative [facility] Team providers are available by phone from 7am to 7pm daily and can be reached through the team cell phone.  Should this patient require assistance outside of these hours, please call the patient's attending physician.

## 2020-06-08 NOTE — Evaluation (Addendum)
Physical Therapy Evaluation Patient Details Name: Charles Schmidt MRN: [id number] DOB: [birth year] Today's Date: 06/08/2020   History of Present Illness  Milos Milligan is a 79 y.o. male presenting with weakness in the setting of vomiting and diarrhea. PMH is significant for metastatic carcinoid tumor, BPH s/p laser vaporization, and HTN.  On precautions for Cdiff.  Clinical Impression  Pt admitted with above diagnosis. Pt was able to stand and pivot to recliner with min assist or 2 for safety. Slightly unsteady and needs guard assist.  Pt with loose BM and needed cleaning.  Did not want to do more than pivot this am.  Daughter reports they are interested in A living. Will need SW involved.  Agree with need for incr care and may need to go SNF and transition to an A living.   Pt currently with functional limitations due to the deficits listed below (see PT Problem List). Pt will benefit from skilled PT to increase their independence and safety with mobility to allow discharge to the venue listed below.      Follow Up Recommendations Supervision/Assistance - 24 hour;SNF (Pt and daughter interested in A living but may need SNF then transition to A living)    [facility] bed    Recommendations for Other Services       Precautions / Restrictions Precautions Precautions: Other (comment);Fall (Cdiff) Restrictions Weight Bearing Restrictions: No      Mobility  Bed Mobility Overal bed mobility: Needs Assistance Bed Mobility: Supine to Sit     Supine to sit: Mod assist;HOB elevated     General bed mobility comments: Needed mod asssit to raise trunk and sit EOB with assist for LEs as well.   Transfers Overall transfer level: Needs assistance Equipment used: Rolling walker (2 wheeled) Transfers: Sit to/from [facility] Sit to Stand: Mod assist;Min assist;+2 safety/equipment;From elevated surface Stand pivot transfers: Min assist;+2  safety/equipment;From elevated surface       General transfer comment: Needed mod assist to power up from low bed but min assist with bed elevated and then cleaned pt of BM.  Pt then took pivotal asteps to chair with +2 for safety.   Ambulation/Gait                Stairs            Wheelchair Mobility    Modified Rankin (Stroke Patients Only)       Balance Overall balance assessment: Needs assistance Sitting-balance support: No upper extremity supported;Feet supported Sitting balance-Leahy Scale: Fair     Standing balance support: Bilateral upper extremity supported;During functional activity Standing balance-Leahy Scale: Poor Standing balance comment: Pt needed UE support for balance                             Pertinent Vitals/Pain Pain Assessment: No/denies pain    Home Living Family/patient expects to be discharged to:: Private residence Living Arrangements: Alone Available Help at Discharge: Family;Available PRN/intermittently Type of Home: House Home Access: Stairs to enter Entrance Stairs-Rails: Left Entrance Stairs-Number of Steps: 3 Home Layout: One level Home Equipment: Cane - single point;Bedside commode;Grab bars - tub/shower      Prior Function Level of Independence: Independent with assistive device(s)         Comments: B/D self     Hand Dominance   Dominant Hand: Right    Extremity/Trunk Assessment   Upper Extremity Assessment Upper Extremity Assessment: Defer to OT  evaluation    Lower Extremity Assessment Lower Extremity Assessment: Generalized weakness    Cervical / Trunk Assessment Cervical / Trunk Assessment: Kyphotic  Communication   Communication: No difficulties  Cognition Arousal/Alertness: Awake/alert Behavior During Therapy: WFL for tasks assessed/performed Overall Cognitive Status: Within Functional Limits for tasks assessed                                        General  Comments General comments (skin integrity, edema, etc.): 107 bpm, 95% RA, 20, 125/80    Exercises     Assessment/Plan    PT Assessment Patient needs continued PT services  PT Problem List Decreased balance;Decreased mobility;Decreased activity tolerance;Decreased knowledge of use of DME;Decreased safety awareness;Decreased knowledge of precautions       PT Treatment Interventions DME instruction;Gait training;Functional mobility training;Therapeutic activities;Therapeutic exercise;Balance training;Patient/family education;Stair training    PT Goals (Current goals can be found in the Care Plan section)  Acute Rehab PT Goals Patient Stated Goal: to go home PT Goal Formulation: With patient Time For Goal Achievement: 06/22/20 Potential to Achieve Goals: Good    Frequency Min 3X/week   Barriers to discharge Decreased caregiver support      Co-evaluation               AM-PAC PT "6 Clicks" Mobility  Outcome Measure Help needed turning from your back to your side while in a flat bed without using bedrails?: A Lot Help needed moving from lying on your back to sitting on the side of a flat bed without using bedrails?: A Lot Help needed moving to and from a bed to a chair (including a wheelchair)?: A Lot Help needed standing up from a chair using your arms (e.g., wheelchair or bedside chair)?: A Lot Help needed to walk in hospital room?: Total Help needed climbing 3-5 steps with a railing? : Total 6 Click Score: 10    End of Session Equipment Utilized During Treatment: Gait belt Activity Tolerance: Patient limited by fatigue Patient left: in chair;with call bell/phone within reach;with chair alarm set;with family/visitor present Nurse Communication: Mobility status PT Visit Diagnosis: Muscle weakness (generalized) (M62.81);Unsteadiness on feet (R26.81)    Time: 6712-4580 PT Time Calculation (min) (ACUTE ONLY): 32 min   Charges:   PT Evaluation $PT Eval Moderate  Complexity: 1 Mod PT Treatments $Therapeutic Activity: 8-22 mins        Ary Rudnick W,PT Acute Rehabilitation Services Pager:  [phone]  Office:  [phone]    Denice Paradise 06/08/2020, 2:18 PM

## 2020-06-08 NOTE — Progress Notes (Signed)
Foley Catheter flushed per order with 120 ml NS, drained 120 ml back, clear  no clots noted.

## 2020-06-08 NOTE — Progress Notes (Addendum)
Family Medicine Teaching Service Daily Progress Note Intern Pager: [phone]  Patient name: Charles Schmidt Medical record number: [id number] Date of birth: July 15, 1941 Age: 79 y.o. Gender: male  Primary Care Provider: Nolene Ebbs, MD Consultants: Oncology, Palliative, Urology, Nephrology Code Status: DNR  Pt Overview and Major Events to Date:  9/14: Admitted for weakness, vomiting, diarrhea. Oncology consulted 9/15: Extreme elevation in AST, GI and surgery consulted, CTA abd/pelvis obtained 9/16: Developed urinary retention and AKI, foley placed, urology and nephrology consulted 9/17: Worsening kidney function and hyperphosphatemia  Assessment and Plan:  Charles Woodsonis a 79 y.o.malepresenting with weakness in the setting of vomiting and diarrhea, subsequently found to have rhabdomyolysis.PMH is significant for metastatic carcinoid tumor, BPH s/p laser vaporization, and HTN.  AKI in the setting of rhabdo, hypotension, contrast exposure and urinary obstruction 2/2 BPH  Hyperphosphatemia Worsening. Patient's Creatinine 3.44 today (from 2.53 yesterday am and 1.09 on admission). Suspect multifactorial etiology including rhabdo (CK downtrending gradually, 1607 this morning), contrast exposure, dehydration/hypotensive event prior to arrival, and urinary obstruction 2/2 BPH. Patient given 500cc LR bolus overnight and has not had any urine output. Remains on LR @ 150 cc/hr. Bladder scan revealed ~150 cc's urine. His phosphorus also worsening, 9.0 this morning (from 7.0 yesterday). -Nephro following, appreciate recommendations -Palliative to discuss [place]  -Oncology following, patient is hospice candidate, pending GOC discussion with palliative -Foley in place -Strict Is/Os -LR @ 75 cc/hr, will discuss with nephro  Metastatic Carcinoid Tumor Patient with hx of carcinoid tumor with mesenteric mass and innumerable hepatic metastasis. Had been receiving monthly sandostatin prior to  admission. -Oncology following, appreciate recommendations -Palliative consulted for [place] discussion, appreciate recommendations  Goals of Care Patient with metastatic carcinoid tumor and worsening kidney function. Failure to thrive currently with weakness, no urine output, no PO intake. -Palliative care to have family discussion today  New O2 requirement Patient required 2L O2 overnight due to O2 sat in the 80s on room air. Patient stable on room air this morning with O2 sats in the high 90s.  -V/Q scan ordered overnight -If stable on room air today, may d/c V/Q scan  Diarrhea, Poor PO intake Patient with 2 episodes of diarrhea overnight. Suspect it's secondary to carcinoid syndrome. GI panel negative. -C. Diff pending -Enteric precautions  FEN/GI: Regular diet PPx: Lovenox  Status is: Inpatient Remains inpatient appropriate because:Persistent severe electrolyte disturbances, Unsafe d/c plan and Inpatient level of care appropriate due to severity of illness  Dispo: The patient is from: Home              Anticipated d/c is to: SNF              Anticipated d/c date is: > 3 days              Patient currently is not medically stable to d/c.   Subjective:  When asked how he's feeling, patient shrugs his shoulders. Says he's feeling so-so. Denies SOB, chest pain, abdominal pain, nausea or vomiting. Had 2 episodes of diarrhea this morning.  Does not have much appetite although is sipping ginger ale. States he hasn't eaten anything since admission.   Objective: Temp:  [97.4 F (36.3 C)-98.5 F (36.9 C)] 97.8 F (36.6 C) (09/17 0336) Pulse Rate:  [99-113] 99 (09/17 0600) Resp:  [12-21] 12 (09/17 0600) BP: (86-111)/(56-75) 111/75 (09/17 0600) SpO2:  [91 %-97 %] 96 % (09/17 0600) Weight:  [97.1 kg-100.1 kg] 98.4 kg (09/17 0444) Physical Exam: General: alert, ill-appearing, oriented to person,  place, time, appears fatigued Cardiovascular: RRR, normal S1/S2 without  m/r/g Respiratory: normal WOB. Lungs CTA anteriorly. Not  Abdomen: firm, nontender, nondistended Extremities: 2+ distal pulses, 1+ pitting edema in b/l lower extremities, 5/5 strength in all 4 extremities  Laboratory: Recent Labs  Lab 06/05/20 0957 06/06/20 0537 06/07/20 0134  WBC 4.0 5.6 4.3  HGB 12.2* 12.8* 12.6*  HCT 37.2* 38.2* 36.1*  PLT 198 168 147*   Recent Labs  Lab 06/07/20 0134 06/07/20 0134 06/07/20 1143 06/07/20 1859 06/08/20 0259  NA 140   < > 145 142 142  K 3.9   < > 4.2 4.2 3.9  CL 111   < > 115* 113* 112*  CO2 12*   < > 14* 13* 15*  BUN 23   < > 27* 34* 37*  CREATININE 2.05*   < > 2.53* 3.01* 3.44*  CALCIUM 7.4*   < > 7.1* 7.1* 7.0*  PROT 6.1*   < > 5.5* 5.6* 5.7*  BILITOT 1.8*   < > 1.4* 1.5* 1.5*  ALKPHOS 357*   < > 289* 287* 273*  ALT 101*   < > 81* 85* 84*  AST 879*  --   --  339* 259*  GLUCOSE 78   < > 64* 86 92   < > = values in this interval not displayed.   CK 4054  Imaging/Diagnostic Tests: No new imaging.  [name], MD 06/08/2020, 6:34 AM PGY-1, [place] Intern pager: [phone], text pages welcome

## 2020-06-08 NOTE — Progress Notes (Signed)
[facility] Progress Note  Name: Charles Schmidt MRN: [id number] DOB: October 18, 1940   Subjective:  He had 2.7 liters UOP over 9/16 charted.  Has continued with foley.  He has been on LR at 75 ml/hr.  His daughter is at bedside and states he hasn't had much output this AM.  She states he normally doesn't have trouble with his memory.    Review of systems:  Denies shortness of breath or chest pain  Denies n/v ------------- Background on consult:  78M admitted 9/14 after presenting with nausea/vomiting/diarrhea/weakness/falls.  PMH Incudes: Metastatic carcinoid tumor followed by Dr. Benay Spice maintained on monthly Sandostatin and with chronic diarrhea.  Also with history of BPH with laser vaporization 12/2017.  Normal GFR.  On consult pt somnolent and not participatory.  His son [name] provides the history.  He had developed acute on chronic diarrhea which was progressive and eventually went to the fall, for which he presented.  At presentation his creatinine was at baseline and has worsened to 2.5 today.  He has received 2 CT scans with IV contrast one on 9/14 and another one yesterday evening.  These have commented on no evidence of hydronephrosis.  But he does have evidence of chronic bladder outlet obstruction with bladder diverticulum.  Earlier today he had high postvoid residuals and had a Foley catheter placed with significant urine output.  Oncology and surgery are following.  He has been found to have elevated CK levels with a peak value of nearly 7000 yesterday, improved to 4900 today.  He has had significant increase in LFTs, predominantly AST.  Total bilirubin has been elevated as well.  Currently he is treated with normal saline IV at 200 mL/h.  K today was 4.2.  Phosphorus 7.7. Blood pressures are soft.   Intake/Output Summary (Last 24 hours) at 06/08/2020 1136 Last data filed at 06/08/2020 0600 Gross per 24 hour  Intake 7082.81 ml  Output 1850 ml  Net 5232.81 ml    Vitals:   Vitals:   06/08/20 0444 06/08/20 0600 06/08/20 0800 06/08/20 0900  BP:  111/75 119/83   Pulse:  99 (!) 103 (!) 104  Resp:  12 (!) 25 18  Temp:      TempSrc:      SpO2:  96% 99% 94%  Weight: 98.4 kg     Height:         Physical Exam:  General adult male in bed in no acute distress HEENT normocephalic atraumatic  Neck supple trachea midline Lungs clear to auscultation bilaterally normal work of breathing at rest  Heart tachycardic, S1S2 no rub Abdomen soft nontender obese habitus  Extremities no edema appreciated Psych no anxiety or agitation  GU - foley in place and minimal urine   Medications reviewed   Labs:  BMP Latest Ref Rng & Units 06/08/2020 06/07/2020 06/07/2020  Glucose 70 - 99 mg/dL 92 86 64(L)  BUN 8 - 23 mg/dL 37(H) 34(H) 27(H)  Creatinine 0.61 - 1.24 mg/dL 3.44(H) 3.01(H) 2.53(H)  Sodium 135 - 145 mmol/L 142 142 145  Potassium 3.5 - 5.1 mmol/L 3.9 4.2 4.2  Chloride 98 - 111 mmol/L 112(H) 113(H) 115(H)  CO2 22 - 32 mmol/L 15(L) 13(L) 14(L)  Calcium 8.9 - 10.3 mg/dL 7.0(L) 7.1(L) 7.1(L)     Assessment/Plan:   78M AKI likely as a consequence of acute on chronic bladder outlet obstruction, rhabdomyolysis, presenting with hypovolemia now compounded by contrast   1. AKI -Acute on chronic bladder outlet obstruction in the setting  of contrast and mild rhabdo as well as hypovolemia - BL SCr around 1 - Flush foley - spoke with RN and placed order.  Continue foley catheter - Continue LR at 75 ml/hr - Will change to renal diet   2. Metabolic acidosis anion gap - renal failure and diarrhea.  On LR and start PO bicarb  3. Metastatic carcinoid tumor - noted.   4. Chronic bladder outlet obstruction - foley in place.  Please continue and will flush foley   5. Acute on chronic Diarrhea - setting of metastatic carcinoid tumor  6. Hyperphosphatemia - changed to renal diet.  Add renvela    7. Mild rhabdomyolysis - fluids as above   8. Transaminitis - improving    Claudia Desanctis, MD 06/08/2020 12:10 PM

## 2020-06-09 DIAGNOSIS — R34 Anuria and oliguria: Secondary | ICD-10-CM

## 2020-06-09 LAB — RENAL FUNCTION PANEL
Albumin: 1.9 g/dL — ABNORMAL LOW (ref 3.5–5.0)
Anion gap: 15 (ref 5–15)
BUN: 51 mg/dL — ABNORMAL HIGH (ref 8–23)
CO2: 15 mmol/L — ABNORMAL LOW (ref 22–32)
Calcium: 7.2 mg/dL — ABNORMAL LOW (ref 8.9–10.3)
Chloride: 112 mmol/L — ABNORMAL HIGH (ref 98–111)
Creatinine, Ser: 5.04 mg/dL — ABNORMAL HIGH (ref 0.61–1.24)
GFR calc Af Amer: 12 mL/min — ABNORMAL LOW (ref >60)
GFR calc non Af Amer: 10 mL/min — ABNORMAL LOW (ref >60)
Glucose, Bld: 102 mg/dL — ABNORMAL HIGH (ref 70–99)
Phosphorus: 8.4 mg/dL — ABNORMAL HIGH (ref 2.5–4.6)
Potassium: 3.7 mmol/L (ref 3.5–5.1)
Sodium: 142 mmol/L (ref 135–145)

## 2020-06-09 LAB — MAGNESIUM: Magnesium: 2.3 mg/dL (ref 1.7–2.4)

## 2020-06-09 LAB — CK: Total CK: 2826 U/L — ABNORMAL HIGH (ref 49–397)

## 2020-06-09 NOTE — Progress Notes (Signed)
Family Medicine Teaching Service Daily Progress Note Intern Pager: [phone]  Patient name: Charles Schmidt Medical record number: [id number] Date of birth: [birth year] Age: 79 y.o. Gender: male  Primary Care Provider: Nolene Ebbs, MD Consultants: Oncology, Palliative, Urology, Nephrology Code Status: DNR   Pt Overview and Major Events to Date:  9/14: Admitted for weakness, vomiting, diarrhea. Oncology consulted 9/15: Extreme elevation in AST, GI and surgery consulted, CTA abd/pelvis obtained 9/16: Developed urinary retention and AKI, foley placed, urology and nephrology consulted 9/17: Worsening kidney function and hyperphosphatemia  Assessment and Plan: Charles Woodsonis a 79 y.o.malepresenting with weakness in the setting of vomiting and diarrhea, subsequently found to have rhabdomyolysis.PMH is significant for metastatic carcinoid tumor, BPH s/p laser vaporization, and HTN.  AKI in the setting of rhabdo, hypotension, contrast exposure and urinary obstruction 2/2 BPH  Hyperphosphatemia Worsening. Cr 1.09>>3.01>3.44>5.04. Suspect multifactorial etiology including rhabdo (CK downtrending gradually, 9983 this morning), contrast exposure, dehydration/hypotensive event prior to arrival, and urinary obstruction 2/2 BPH. Continues to have aneuria/oliguira with only 2600cc out over the last 24hours. Fluids have been reduced however he has evidence of retention. Palliative and nephro to talk to family today regarding next steps as he is not a good candidate for dialysis. Phos improved from 9.0>8.4 with Renvela.  -Nephro following, appreciate recommendations -Palliative following, appreciate there assistance with this difficult situtaion -Oncology following, patient is hospice candidate, pending GOC discussion with palliative -Foley in place -Strict Is/Os -LR @ 75 cc/hr, will likely need to discontinue  Goals of Care Patient with metastatic carcinoid tumor and worsening kidney function.  Failure to thrive currently with weakness, no urine output, no PO intake. Palliative spoke to family and need time to process. Will continue to follow up. -Palliative care and nephro to have family discussion today  Metastatic Carcinoid Tumor Patient with hx of carcinoid tumor with mesenteric mass and innumerable hepatic metastasis. Had been receiving monthly sandostatin prior to admission. -Oncology following, appreciate recommendations -Palliative consulted for [place] discussion, appreciate recommendations  New O2 requirement, resolved Remained stable on room air overnight at 92-95%. Renally dosed lovenox since admission. - continue to monitor - discontinue V/Q scan  Diarrhea, Poor PO intake likely 2/2 Metastatic Carcinoid tumor, stable Patient with 4 episodes of diarrhea overnight (baseline 4-5). Suspect it's secondary to carcinoid syndrome. GI panel negative. Per ONc, unlikely c.diff. Started Octreotide 95mcv SQ q8. - Discontinue C.diff and enteric precautions given this has still not been collected and low suspicion  - continue octreotide   Hiccups: May be related to uremia vs known cancer. Can consider muscle relaxer vs PPI. - consider PO Protonix - consider Zanaflex at very low dose vs Reglan at 50% reduced dose for kidney impairment   FEN/GI: Renal diet PPx: Renally adjusted Lovenox  Status is: Inpatient Remains inpatient appropriate because:Persistent severe electrolyte disturbances, Unsafe d/c plan and Inpatient level of care appropriate due to severity of illness. [name] discussion today.  Dispo: The patient is from: Home  Anticipated d/c is to: SNF vs Hospice   Anticipated d/c date is: > 3 days  Patient currently is not medically stable to d/c.  Disposition: pending improvement vs GOC discussion to Hospice  Subjective:  Patient resting comfortably in bed with daughter at side. Provided extensive update and education to daughter  regarding hospital stay thus far and plan going forward. She is amendable to the plan. Patient has developed hiccups since last night. She notes that his is someone chronic. He otherwise denies any concerns or complaints. Has been tolerating  PO.   Objective: Temp:  [97.8 F (36.6 C)-99.2 F (37.3 C)] 97.8 F (36.6 C) (09/18 0800) Pulse Rate:  [90-100] 98 (09/18 0400) Resp:  [13-23] 21 (09/18 0800) BP: (97-142)/(52-88) 142/88 (09/18 0800) SpO2:  [92 %-97 %] 96 % (09/18 0400) Weight:  [100.7 kg] 100.7 kg (09/18 0114) Physical Exam: General: pleasant thin older man, lying comfortably in exam bed, appears weak and tired but in no acute distress with non-toxic appearance CV: regular rate and rhythm without murmurs, rubs, or gallops, 1-2+  extremity edema UE and LE bilaterally, 2+ radial and pedal pulses bilaterally Resp: breathing comfortably on room air Skin: warm, dry, hands slightly cold Neuro: speech slurred but at baseline  Laboratory: Recent Labs  Lab 06/05/20 0957 06/06/20 0537 06/07/20 0134  WBC 4.0 5.6 4.3  HGB 12.2* 12.8* 12.6*  HCT 37.2* 38.2* 36.1*  PLT 198 168 147*   Recent Labs  Lab 06/07/20 0134 06/07/20 0134 06/07/20 1143 06/07/20 1143 06/07/20 1859 06/08/20 0259 06/09/20 0551  NA 140   < > 145   < > 142 142 142  K 3.9   < > 4.2   < > 4.2 3.9 3.7  CL 111   < > 115*   < > 113* 112* 112*  CO2 12*   < > 14*   < > 13* 15* 15*  BUN 23   < > 27*   < > 34* 37* 51*  CREATININE 2.05*   < > 2.53*   < > 3.01* 3.44* 5.04*  CALCIUM 7.4*   < > 7.1*   < > 7.1* 7.0* 7.2*  PROT 6.1*   < > 5.5*  --  5.6* 5.7*  --   BILITOT 1.8*   < > 1.4*  --  1.5* 1.5*  --   ALKPHOS 357*   < > 289*  --  287* 273*  --   ALT 101*   < > 81*  --  85* 84*  --   AST 879*  --   --   --  339* 259*  --   GLUCOSE 78   < > 64*   < > 86 92 102*   < > = values in this interval not displayed.    CK 4903>4100>4054>2826 Mag 2.3  Imaging/Diagnostic Tests: DG CHEST PORT 1 VIEW  Result Date:  06/08/2020 CLINICAL DATA:  Shortness of breath EXAM: PORTABLE CHEST 1 VIEW COMPARISON:  Radiograph 06/07/2020 FINDINGS: Slightly increase in the heterogeneous basilar opacities seen in both lungs. Partial obscuration of the hemidiaphragms may reflect trace developing effusions. No visible pneumothorax. Stable cardiomediastinal contours with a calcified aorta. Central pulmonary artery prominence could reflect some chronic pulmonary artery hypertension. No acute osseous or soft tissue abnormality. Degenerative changes are present in the imaged spine and shoulders. Telemetry leads overlie the chest. IMPRESSION: 1. Slightly increase in the heterogeneous basilar opacities in both lungs, could reflect worsening infection versus atelectasis or edema. 2. Possible trace bilateral pleural effusions. 3.  Aortic Atherosclerosis (ICD10-I70.0). 4. Prominent central pulmonary arteries may reflect pulmonary artery hypertension. Electronically Signed   By: Lovena Le M.D.   On: 06/08/2020 16:24   Danna Hefty, DO 06/09/2020, 12:52 PM PGY-3, [place] Intern pager: [phone], text pages welcome

## 2020-06-09 NOTE — Progress Notes (Addendum)
   Palliative Medicine Inpatient Follow Up Note   Reason for consult:  Goals of Care  HPI:  Per intake H&P --> Charles Woodsonis a 79 y.o.malepresenting with weakness in the setting of vomiting and diarrhea.PMH is significant for metastatic carcinoid tumor, BPH s/p laser vaporization, and HTN.  Palliative care was asked to get involved to aid in goals of care conversations in the setting of acute illness and declining health state. He is followed by OP Oncology - Dr. Benay Schmidt for metastatic carcinoid tumor.   Today's Discussion (06/09/2020): Chart reviewed - Cr 5.04/BUN 51. Called patients [name] this morning. Discussed with him the kidney function was worsening. Charles Schmidt chares that he will be out of town this weekend and back on Monday. He expresses needing time to digest the information provided - he requests more information from the nephrology team.   Addendum: _________________________________________________________ I met Charles Schmidt at bedside. We discussed Charles Schmidt present situation. Charles Schmidt shares that Charles Schmidt is more alert today, eating and drinking. She expresses that she feels the color of his urine is improving. We talked about getting additional guidance from Dr. Benay Schmidt on Monday and scheduling a family meeting thereafter to discuss options. Charles Schmidt asked about if Charles Schmidt kidney function improves would he be a good candidate for SNF. We discussed skilled options versus hospice depending upon how the next few days play out. Provided education and guidance on hospice care.  Discussed the importance of continued conversation with family and their  medical providers regarding overall plan of care and treatment options, ensuring decisions are within the context of the patients values and GOCs.  Questions and concerns addressed   Time Started: 1330 Time Ended: 1400 Time ended: 30 minutes Greater than 50% of the time was spent in counseling and coordination of care  Subjective  Assessment: Vital Signs Vitals:   06/09/20 0600 06/09/20 0800  BP: 124/86 (!) 142/88  Pulse:    Resp: 14 (!) 21  Temp:  97.8 F (36.6 C)  SpO2:      Intake/Output Summary (Last 24 hours) at 06/09/2020 0973 Last data filed at 06/09/2020 0440 Gross per 24 hour  Intake 2112.41 ml  Output 260 ml  Net 1852.41 ml   Last Weight  Most recent update: 06/09/2020  1:14 AM   Weight  100.7 kg (222 lb)           Gen:  Elderly M in NAD HEENT: Dry mucous membranes CV: Regular rate and rhythm, no murmurs rubs or gallops PULM: 2LPM Charles Schmidt (+) rhonchi  ABD: soft/nontender/nondistended/normal bowel sounds  Neuro: Alert and oriented   SUMMARY OF RECOMMENDATIONS DNAR/DNI  Have requested nephrology team update patients son, Charles Schmidt Oncology input - If kidneys do not turn around ongoing conversations regarding comfort oriented care - Dr. Benay Schmidt will be back on Monday to speak with the family  [facility] provided information on home versus residential hospice  PMT continued support   Time Spent: 25 Greater than 50% of the time was spent in counseling and coordination of care ______________________________________________________________________________________ [place] Team Team Cell Phone: [phone] Please utilize secure chat with additional questions, if there is no response within 30 minutes please call the above phone number  Palliative Medicine Team providers are available by phone from 7am to 7pm daily and can be reached through the team cell phone.  Should this patient require assistance outside of these hours, please call the patient's attending physician.

## 2020-06-09 NOTE — Progress Notes (Signed)
Pts still having frequent BMs but  is now soft brown in color.

## 2020-06-09 NOTE — Progress Notes (Signed)
[facility] Progress Note  Name: Charles Schmidt MRN: [id number] DOB: 1941/05/11   Subjective:  He had 2.7 liters UOP over 9/16 charted but only 373mL yesterday.  Cr worsening from 3.4 to 5.04.  Has continued with foley.  He has been on LR at 75 ml/hr.   Palliative care seeing this AM. Daughter updated bedside. I called and updated his son [name] via phone - answered all questions.    Review of systems:  Denies shortness of breath or chest pain  Denies n/v ------------- Background on consult:  41M admitted 9/14 after presenting with nausea/vomiting/diarrhea/weakness/falls.  PMH Incudes: Metastatic carcinoid tumor followed by Dr. Benay Spice maintained on monthly Sandostatin and with chronic diarrhea.  Also with history of BPH with laser vaporization 12/2017.  Normal GFR.  On consult pt somnolent and not participatory.  His son [name] provides the history.  He had developed acute on chronic diarrhea which was progressive and eventually went to the fall, for which he presented.  At presentation his creatinine was at baseline and has worsened to 2.5 today.  He has received 2 CT scans with IV contrast one on 9/14 and another one yesterday evening.  These have commented on no evidence of hydronephrosis.  But he does have evidence of chronic bladder outlet obstruction with bladder diverticulum.  Earlier today he had high postvoid residuals and had a Foley catheter placed with significant urine output.  Oncology and surgery are following.  He has been found to have elevated CK levels with a peak value of nearly 7000 yesterday, improved to 4900 today.  He has had significant increase in LFTs, predominantly AST.  Total bilirubin has been elevated as well.  Currently he is treated with normal saline IV at 200 mL/h.  K today was 4.2.  Phosphorus 7.7. Blood pressures are soft.   Intake/Output Summary (Last 24 hours) at 06/09/2020 0856 Last data filed at 06/09/2020 0440 Gross per 24 hour  Intake  2112.41 ml  Output 260 ml  Net 1852.41 ml    Vitals:  Vitals:   06/09/20 0311 06/09/20 0400 06/09/20 0600 06/09/20 0800  BP: (!) 97/52 116/81 124/86 (!) 142/88  Pulse:  98    Resp: 14 (!) 23 14 (!) 21  Temp: 98.8 F (37.1 C)   97.8 F (36.6 C)  TempSrc: Tympanic   Oral  SpO2: 95% 96%    Weight:      Height:         Physical Exam:  General adult male in bed in no acute distress, hiccuping (daughter says frequent issue over many years) HEENT normocephalic atraumatic  Neck supple trachea midline Lungs clear to auscultation bilaterally normal work of breathing at rest  Heart tachycardic, S1S2 no rub Abdomen soft nontender obese habitus  Extremities 1+ diffuse pitting edema in extremities Psych no anxiety or agitation  GU - foley in place and ~200 yellow urine in bag  Medications reviewed   Labs:  BMP Latest Ref Rng & Units 06/09/2020 06/08/2020 06/07/2020  Glucose 70 - 99 mg/dL 102(H) 92 86  BUN 8 - 23 mg/dL 51(H) 37(H) 34(H)  Creatinine 0.61 - 1.24 mg/dL 5.04(H) 3.44(H) 3.01(H)  Sodium 135 - 145 mmol/L 142 142 142  Potassium 3.5 - 5.1 mmol/L 3.7 3.9 4.2  Chloride 98 - 111 mmol/L 112(H) 112(H) 113(H)  CO2 22 - 32 mmol/L 15(L) 15(L) 13(L)  Calcium 8.9 - 10.3 mg/dL 7.2(L) 7.0(L) 7.1(L)     Assessment/Plan:   41M AKI likely as a consequence of  acute on chronic bladder outlet obstruction, rhabdomyolysis, presenting with hypovolemia now compounded by contrast   1. AKI -Acute on chronic bladder outlet obstruction in the setting of contrast and mild rhabdo as well as hypovolemia - BL SCr around 1 but unfortunately continues to worsen - Continue foley catheter - With ++ net positive and edema developing d/c MIVF, he's able to tolerate po intake.   - I spoke to family today re: worsening renal function, agree with plans for palliative care if kidneys don't improve with supportive care.  2. Metabolic acidosis anion gap - po bicarb  3. Metastatic carcinoid tumor - noted. Dr.  Benay Spice follows.  4. Chronic bladder outlet obstruction - foley in place.  Please continue.  5. Acute on chronic Diarrhea - setting of metastatic carcinoid tumor  6. Hyperphosphatemia - changed to renal diet.  Added renvela - if palliative measure pursued liberalize diet and d/c binder.   7. Mild rhabdomyolysis - fluids as above   8. Transaminitis - improving   Justin Mend, MD 06/09/2020 8:56 AM

## 2020-06-10 LAB — MAGNESIUM: Magnesium: 2.3 mg/dL (ref 1.7–2.4)

## 2020-06-10 LAB — RENAL FUNCTION PANEL
Albumin: 1.7 g/dL — ABNORMAL LOW (ref 3.5–5.0)
Anion gap: 15 (ref 5–15)
BUN: 63 mg/dL — ABNORMAL HIGH (ref 8–23)
CO2: 13 mmol/L — ABNORMAL LOW (ref 22–32)
Calcium: 7.3 mg/dL — ABNORMAL LOW (ref 8.9–10.3)
Chloride: 108 mmol/L (ref 98–111)
Creatinine, Ser: 5.76 mg/dL — ABNORMAL HIGH (ref 0.61–1.24)
GFR calc Af Amer: 10 mL/min — ABNORMAL LOW (ref >60)
GFR calc non Af Amer: 9 mL/min — ABNORMAL LOW (ref >60)
Glucose, Bld: 111 mg/dL — ABNORMAL HIGH (ref 70–99)
Phosphorus: 8.6 mg/dL — ABNORMAL HIGH (ref 2.5–4.6)
Potassium: 3.4 mmol/L — ABNORMAL LOW (ref 3.5–5.1)
Sodium: 136 mmol/L (ref 135–145)

## 2020-06-10 MED ORDER — POTASSIUM CHLORIDE 20 MEQ PO PACK
20.0000 meq | PACK | Freq: Once | ORAL | Status: AC
  Administered 2020-06-10: 20 meq via ORAL
  Filled 2020-06-10: qty 1

## 2020-06-10 MED ORDER — SODIUM BICARBONATE 650 MG PO TABS
1300.0000 mg | ORAL_TABLET | Freq: Three times a day (TID) | ORAL | Status: DC
  Administered 2020-06-10 – 2020-07-05 (×74): 1300 mg via ORAL
  Filled 2020-06-10 (×78): qty 2

## 2020-06-10 MED ORDER — TIZANIDINE HCL 4 MG PO TABS
2.0000 mg | ORAL_TABLET | Freq: Three times a day (TID) | ORAL | Status: DC | PRN
  Administered 2020-06-10 – 2020-06-15 (×9): 2 mg via ORAL
  Filled 2020-06-10 (×10): qty 1

## 2020-06-10 MED ORDER — SODIUM CHLORIDE 0.9 % IV SOLN: INTRAVENOUS | Status: AC

## 2020-06-10 NOTE — Progress Notes (Addendum)
[facility] Progress Note  Name: Charles Schmidt MRN: [id number] DOB: Sep 13, 1941   Subjective:  UOP 348mL yesterday with po hydration - daughter thinks 30oz+ yesterday, ate some food too. Ongoing hiccups off and on but this has been a chronic issue.  Cont with loose stools.  Review of systems:  Denies shortness of breath or chest pain  Denies n/v ------------- Background on consult:  17M admitted 9/14 after presenting with nausea/vomiting/diarrhea/weakness/falls.  PMH Incudes: Metastatic carcinoid tumor followed by Dr. Benay Spice maintained on monthly Sandostatin and with chronic diarrhea.  Also with history of BPH with laser vaporization 12/2017.  Normal GFR.  On consult pt somnolent and not participatory.  His [name] provides the history.  He had developed acute on chronic diarrhea which was progressive and eventually went to the fall, for which he presented.  At presentation his creatinine was at baseline and has worsened to 2.5 today.  He has received 2 CT scans with IV contrast one on 9/14 and another one yesterday evening.  These have commented on no evidence of hydronephrosis.  But he does have evidence of chronic bladder outlet obstruction with bladder diverticulum.  Earlier today he had high postvoid residuals and had a Foley catheter placed with significant urine output.  Oncology and surgery are following.  He has been found to have elevated CK levels with a peak value of nearly 7000 yesterday, improved to 4900 today.  He has had significant increase in LFTs, predominantly AST.  Total bilirubin has been elevated as well.  Currently he is treated with normal saline IV at 200 mL/h.  K today was 4.2.  Phosphorus 7.7. Blood pressures are soft.   Intake/Output Summary (Last 24 hours) at 06/10/2020 0944 Last data filed at 06/10/2020 0650 Gross per 24 hour  Intake 240 ml  Output 390 ml  Net -150 ml    Vitals:  Vitals:   06/10/20 0400 06/10/20 0600 06/10/20 0650 06/10/20  0801  BP: 136/86 (!) 143/91  138/89  Pulse:  92    Resp: 15 17  15   Temp: 98.3 F (36.8 C)   97.8 F (36.6 C)  TempSrc: Oral   Oral  SpO2: 96% 96%    Weight:   103 kg   Height:         Physical Exam:  General adult male in bed in no acute distress, hiccuping (daughter says frequent issue over many years) HEENT normocephalic atraumatic  Neck supple trachea midline Lungs clear to auscultation bilaterally normal work of breathing at rest  Heart tachycardic, S1S2 no rub Abdomen soft nontender obese habitus  Extremities 1+ diffuse pitting edema in extremities but improved c/w yesterday Psych no anxiety or agitation  GU - foley in place and ~200 yellow urine in bag  Medications reviewed   Labs:  BMP Latest Ref Rng & Units 06/09/2020 06/08/2020 06/07/2020  Glucose 70 - 99 mg/dL 102(H) 92 86  BUN 8 - 23 mg/dL 51(H) 37(H) 34(H)  Creatinine 0.61 - 1.24 mg/dL 5.04(H) 3.44(H) 3.01(H)  Sodium 135 - 145 mmol/L 142 142 142  Potassium 3.5 - 5.1 mmol/L 3.7 3.9 4.2  Chloride 98 - 111 mmol/L 112(H) 112(H) 113(H)  CO2 22 - 32 mmol/L 15(L) 15(L) 13(L)  Calcium 8.9 - 10.3 mg/dL 7.2(L) 7.0(L) 7.1(L)     Assessment/Plan:   17M AKI likely as a consequence of acute on chronic bladder outlet obstruction, rhabdomyolysis, presenting with hypovolemia now compounded by contrast   1. AKI: Baseline around 1, AKI due to  acute on chronic bladder outlet obstruction in the setting of contrast and mild rhabdo as well as hypovolemia - Worsening edema yesterday and increased po intake so we held on IVF yesterday -  labs today still need to be drawn (discussed with RN).  Plan is if renal function improved cont with oral hydration, if worse will give 86mL/hr x 12h. - Continue foley catheter - I have spoken to family re: worsening renal function, agree with plans for palliative care if kidneys don't improve with supportive care.  They are aware he is not a candidate for dialysis.  2. Metabolic acidosis anion gap  - po bicarb  3. Metastatic carcinoid tumor - noted. Dr. Benay Spice follows.  My understanding is that he returns tomorrow and will see the patient  4. Chronic bladder outlet obstruction - foley in place.  Please continue.  5. Acute on chronic Diarrhea - setting of metastatic carcinoid tumor, per nursing notes has been more formed.    6. Hyperphosphatemia - changed to renal diet.  Added renvela - if palliative measure pursued liberalize diet and d/c binder.   7. Mild rhabdomyolysis - fluids as above   8. Transaminitis - improving    Justin Mend, MD 06/10/2020 9:44 AM   ADDENDUM: Rev'd labs - creatinine 5.7, start gentle IVF, Bicarb lower ^ po bicarb to TID>  Called son to update on labs and plan. He will be at bedside tomorrow.  If renal function continues to worsen would recommend ongoing hospice talks as he is not a candidate for dialysis.  As such nephrology will not formally follow.  Please let us know if we can be of assistance but palliative care and family have a good understanding of the renal issue at hand.

## 2020-06-10 NOTE — Progress Notes (Signed)
Patient BP drop to 89/69 , HR 83, 97%/RA after the first dose of Zanaflex 2 mg, MD on call notified, no new order now, MD will come to see the patient. Will continue to monitor the patient.

## 2020-06-10 NOTE — Plan of Care (Signed)
?  Problem: Coping: ?Goal: Level of anxiety will decrease ?Outcome: Progressing ?  ?Problem: Safety: ?Goal: Ability to remain free from injury will improve ?Outcome: Progressing ?  ?

## 2020-06-10 NOTE — Progress Notes (Addendum)
   Palliative Medicine Inpatient Follow Up Note   Reason for consult:  Goals of Care  HPI:  Per intake H&P --> Charles Woodsonis a 79 y.o.malepresenting with weakness in the setting of vomiting and diarrhea.PMH is significant for metastatic carcinoid tumor, BPH s/p laser vaporization, and HTN.  Palliative care was asked to get involved to aid in goals of care conversations in the setting of acute illness and declining health state. He is followed by OP Oncology - Dr. Benay Spice for metastatic carcinoid tumor.   Today's Discussion (06/09/2020): Chart reviewed - Cr 5.76/BUN 63. Nephrology updated patients family yesterday. Spoke to [employer] at bedside. He endorses that he has hiccups and that he required medication for muscle spasms. Hi RN, Nicholes Rough is present as well and shares that this caused a drop in blood pressure though it is now normalizing.   Tonya asked if all family members could be present when Dr. Benay Spice rounds in the morning - they would value hearing his insights on the present situation. Nursing staff shared that given this is a situation where goals are being discussed it would be okay. Called the front desk as Delma Freeze, and [name] will be here at 6:45.    Discussed the importance of continued conversation with family and their  medical providers regarding overall plan of care and treatment options, ensuring decisions are within the context of the patients values and GOCs.  Questions and concerns addressed   Subjective Assessment: Vital Signs Vitals:   06/10/20 1334 06/10/20 1421  BP:  110/70  Pulse:    Resp:    Temp: 98 F (36.7 C)   SpO2:      Intake/Output Summary (Last 24 hours) at 06/10/2020 1659 Last data filed at 06/10/2020 1649 Gross per 24 hour  Intake 360 ml  Output 400 ml  Net -40 ml   Last Weight  Most recent update: 06/10/2020  6:50 AM   Weight  103 kg (227 lb)           Gen:  Elderly M in NAD HEENT: Dry mucous membranes CV:  Regular rate and rhythm, no murmurs rubs or gallops PULM: 2LPM [place] (+) rhonchi  ABD: soft/nontender/nondistended/normal bowel sounds  Neuro: Alert and oriented   SUMMARY OF RECOMMENDATIONS DNAR/DNI  Appreciate Oncology input - If kidneys do not turn around ongoing conversations regarding comfort oriented care - Dr. Benay Spice will be back on Monday to speak with the family all three children plan on being at bedside in the morning.  [facility] provided information on home versus residential hospice on Friday  PMT continued support   Time Spent: 25 Greater than 50% of the time was spent in counseling and coordination of care ______________________________________________________________________________________ [place] Team Team Cell Phone: [phone] Please utilize secure chat with additional questions, if there is no response within 30 minutes please call the above phone number  Palliative Medicine Team providers are available by phone from 7am to 7pm daily and can be reached through the team cell phone.  Should this patient require assistance outside of these hours, please call the patient's attending physician.

## 2020-06-10 NOTE — Progress Notes (Signed)
Family Medicine Teaching Service Daily Progress Note Intern Pager: [phone]  Patient name: Charles Schmidt Medical record number: [id number] Date of birth: [birth year] Age: 79 y.o. Gender: male  Primary Care Provider: Nolene Ebbs, MD Consultants: Oncology, Palliative, Urology, Nephrology Code Status: DNR   Pt Overview and Major Events to Date:  9/14: Admitted for weakness, vomiting, diarrhea. Oncology consulted 9/15: Extreme elevation in AST, GI and surgery consulted, CTA abd/pelvis obtained 9/16: Developed urinary retention and AKI, foley placed, urology and nephrology consulted 9/17: Worsening kidney function and hyperphosphatemia  Assessment and Plan: Charles Woodsonis a 79 y.o.malepresenting with weakness in the setting of vomiting and diarrhea, subsequently found to have rhabdomyolysis.PMH is significant for metastatic carcinoid tumor, BPH s/p laser vaporization, and HTN.  AKI in the setting of rhabdo, hypotension, contrast exposure and urinary obstruction 2/2 BPH   Hyperphosphatemia Suspect multifactorial etiology including rhabdo (CK downtrended), contrast exposure, dehydration/hypotensive event prior to arrival, and urinary obstruction 2/2 BPH. Continues to have aneuria/oliguira with only 50cc out over the last 24hours. Fluids discontinued. Per npehro, not a candidate for dialysis - recommending hospice. Labs pending this AM. Per nephro, if worsening will given 75cc/hr x 12h. If stable, will continue to encourage PO. Palliative continues family discussion. -Nephro following, appreciate recommendations -Palliative following, appreciate there assistance with this difficult situtaion -Oncology following, patient is hospice candidate, Dr. Learta Codding to see patient in AM - Foley in place - Strict Is/Os - PO as tolerated  Goals of Care Patient with metastatic carcinoid tumor and worsening kidney function. Failure to thrive currently with weakness, no urine output, and limited PO  intake. Palliative spoke to family and need time to process. Will continue to follow up. -Palliative care and nephro to have family discussion today  Metastatic Carcinoid Tumor Patient with hx of carcinoid tumor with mesenteric mass and innumerable hepatic metastasis. Had been receiving monthly sandostatin prior to admission. -Oncology following, appreciate recommendations -Palliative consulted for [place] discussion, appreciate recommendations  Diarrhea, Poor PO intake likely 2/2 Metastatic Carcinoid tumor, stable Patient with 6 episodes of diarrhea overnight (baseline 4-5). Suspect it's secondary to carcinoid syndrome. Infectious causes ruled out.   - continue octreotide  - follow up Dr. Veryl Speak, consider additional agent to help with diarrhea   Hiccups: May be related to uremia vs known cancer. Can consider muscle relaxer vs PPI. - consider PO Protonix - Zanaflex at 2mg  q8 PRN for hiccups   FEN/GI: Renal diet PPx: Renally adjusted Lovenox  Status is: Inpatient Remains inpatient appropriate because:Persistent severe electrolyte disturbances, Unsafe d/c plan and Inpatient level of care appropriate due to severity of illness. [place] discussion ongoing  Dispo: The patient is from: Home  Anticipated d/c is to: SNF vs Hospice   Anticipated d/c date is: > 3 days  Patient currently is not medically stable to d/c.  Disposition: pending improvement vs GOC discussion to Hospice  Subjective:  Patient resting comfortably this morning with daughter at bedside.  He is more tired but communicative when necessary.  Denies any acute concerns or complaints.  Has enjoyed eating food but tends to have bad diarrhea after.  Per daughter he perks up after eating but the diarrhea takes a lot out of him.  He continues to have hiccups.  Objective: Temp:  [97.8 F (36.6 C)-98.9 F (37.2 C)] 97.8 F (36.6 C) (09/19 0801) Pulse Rate:  [89-98] 92 (09/19 0600) Resp:   [15-22] 15 (09/19 0801) BP: (127-143)/(85-93) 138/89 (09/19 0801) SpO2:  [93 %-97 %] 96 % (09/19 0600) Weight:  [  103 kg] 103 kg (09/19 0650) Physical Exam: General: pleasant thin elderly male, lying comfortably in exam bed, appears weak and tired but in no acute distress with nontoxic appearance CV: regular rate and rhythm without murmurs, rubs, or gallops, 1+ edema in lower extremities to mid shin bilaterally, edema in hands bilaterally have improved, 2+ radial and pedal pulses bilaterally Resp: breathing comfortably on room air, speaking in full sentences Skin: warm, dry, left hand is slightly cold to touch Neuro: Speech is slurred but at baseline, awake and alert  Laboratory: Recent Labs  Lab 06/05/20 0957 06/06/20 0537 06/07/20 0134  WBC 4.0 5.6 4.3  HGB 12.2* 12.8* 12.6*  HCT 37.2* 38.2* 36.1*  PLT 198 168 147*   Recent Labs  Lab 06/07/20 0134 06/07/20 0134 06/07/20 1143 06/07/20 1143 06/07/20 1859 06/08/20 0259 06/09/20 0551  NA 140   < > 145   < > 142 142 142  K 3.9   < > 4.2   < > 4.2 3.9 3.7  CL 111   < > 115*   < > 113* 112* 112*  CO2 12*   < > 14*   < > 13* 15* 15*  BUN 23   < > 27*   < > 34* 37* 51*  CREATININE 2.05*   < > 2.53*   < > 3.01* 3.44* 5.04*  CALCIUM 7.4*   < > 7.1*   < > 7.1* 7.0* 7.2*  PROT 6.1*   < > 5.5*  --  5.6* 5.7*  --   BILITOT 1.8*   < > 1.4*  --  1.5* 1.5*  --   ALKPHOS 357*   < > 289*  --  287* 273*  --   ALT 101*   < > 81*  --  85* 84*  --   AST 879*  --   --   --  339* 259*  --   GLUCOSE 78   < > 64*   < > 86 92 102*   < > = values in this interval not displayed.    CK 4903>4100>4054>2826 Mag 2.3  Imaging/Diagnostic Tests: No results found. Danna Hefty, DO 06/10/2020, 10:19 AM PGY-3, [place] Intern pager: [phone], text pages welcome

## 2020-06-11 DIAGNOSIS — R1115 Cyclical vomiting syndrome unrelated to migraine: Secondary | ICD-10-CM | POA: Diagnosis not present

## 2020-06-11 LAB — MAGNESIUM: Magnesium: 2.2 mg/dL (ref 1.7–2.4)

## 2020-06-11 LAB — RENAL FUNCTION PANEL
Albumin: 1.6 g/dL — ABNORMAL LOW (ref 3.5–5.0)
Anion gap: 15 (ref 5–15)
BUN: 68 mg/dL — ABNORMAL HIGH (ref 8–23)
CO2: 14 mmol/L — ABNORMAL LOW (ref 22–32)
Calcium: 7.5 mg/dL — ABNORMAL LOW (ref 8.9–10.3)
Chloride: 108 mmol/L (ref 98–111)
Creatinine, Ser: 6.14 mg/dL — ABNORMAL HIGH (ref 0.61–1.24)
GFR calc Af Amer: 9 mL/min — ABNORMAL LOW (ref >60)
GFR calc non Af Amer: 8 mL/min — ABNORMAL LOW (ref >60)
Glucose, Bld: 91 mg/dL (ref 70–99)
Phosphorus: 8.9 mg/dL — ABNORMAL HIGH (ref 2.5–4.6)
Potassium: 3.6 mmol/L (ref 3.5–5.1)
Sodium: 137 mmol/L (ref 135–145)

## 2020-06-11 MED ORDER — OCTREOTIDE ACETATE 50 MCG/ML IJ SOLN
150.0000 ug | Freq: Three times a day (TID) | INTRAMUSCULAR | Status: DC
  Administered 2020-06-11 – 2020-06-21 (×31): 150 ug via SUBCUTANEOUS
  Filled 2020-06-11 (×32): qty 3

## 2020-06-11 NOTE — Hospital Course (Addendum)
Charles Schmidt is a 79 year old male presenting with weakness in the setting of vomiting and diarrhea.  PMH significant for metastatic carcinoid tumor, BPH s/p laser vaporization and HTN.   ARF/AKI Patient presented with AKI/ARF in the setting of hypotension, contrast exposure, rhabdomyolysis, urinary obstruction secondary to BPH.  During admission patient was found to have rhabdomyolysis, likely secondary to fall (see below).  Patient also received 2 CT scans with IV contrast that could have potentially contributed to AKI, and showed evidence of chronic bladder outlet obstruction with bladder diverticulum.  Nephrology was consulted, patient was determined not to be candidate for hemodialysis.  Kidney function continued to decline with hyperphosphatemia noted even with treatment with phosphate binder Renvela.  Patient became oliguric and had urine output levels between 0.1-0.3 mL/kg/hr. Patient was found to have an anion gap metabolic acidosis with low bicarb, nephrology placed patient on oral bicarb.  With time, patient's kidneys improved with urine output increasing, phosphorus decreasing, creatinine improving.  Patient's Renvela was discontinued. Kidney function improved on discharge. Cr 1.51 prior to discharge.  Bacteremia  UTI During hospital stay, patient developed a fever to 101.6 F on 9/30, workup was started with blood cultures, U/A with culture, CXR, KUB, EKG. Patient was started on broad-spectrum antibiotics. CXR with findings concerning for bilateral pneumonia versus pulmonary edema. Blood cultures grew pansensitive staph epidermidis, and urine culture grew pansensitive E. coli, so patient was then transitioned to IV cefazolin. He completed his antibiotic course and remained afebrile.  Vancomycin (9/30) Cefepime (9/30) Cefazolin (10/1-10/6)  History of metastatic carcinoid tumor, central mesenteric mass with extensive liver mets, stable Patient was admitted for weakness, vomiting, and  diarrhea with a history significant for metastatic carcinoid tumor. Patient's oncologist, Dr. Benay Spice was consulted for recommendations for treatment, especially in the setting of chronic diarrhea with acute increase in frequency.  Palliative care was involved to aid in goals of care discussion.  Upon discussion with patient and family, decision had been made to pursue inpatient hospice; however, given clinical improvement after being treated for UTI and bacteremia, he no longer met criteria for inpatient hospice, so ultimately decided to pursue SNF.  Diarrhea Patient reported baseline diarrhea with reported average of 4-5 loose bowel movements per day, likely secondary to carcinoid tumor. In the hospital, bowel movements increased and octreotide was increased to 150 mg every 6 hours. GI panel was negative.  Patient improved with increased dose of octreotide, patient noticed improvement. Patient then began having several episodes of diarrhea again with moisture-associated skin damage on the buttocks. Due to profuse watery diarrhea, a rectal tube was placed, which was removed prior to discharge. Overall improved prior to discharge. Per Dr. Benay Spice: Needs lanreotide injection 10/15 and lab/OV/injection week of 07/30/20.   Lower extremity edema Patient has had bilateral lower extremity pitting edema throughout admission. Bilateral DVT US was obtained to rule out DVT, preliminary results negative for DVT.  Hiccups, resolved Patient had several days of intractable hiccups while in the hospital.  Unsure of clear cause possibly from uremia versus cancer.  Patient was trialed on Zanaflex and Haldol with reported improvement but too much sedation.  Patient started Baclofen PRN with improvement, resolved prior to discharge.  Rhabdomyolysis, resolved Patient had a fall prior to admission with normal creatinine kinase levels on admission. CK levels subsequently rose to >6000 and patient was found to have  rhabdomyolysis.  CK levels and AST down trended appropriately with IV fluids.  Urinary retention  History of prostatic hypertrophy status  vaporization, stable  Patient has a history of prostatic hypertrophy and is status-post vaporization. During hospitalization, patient developed urinary retention and AKI. A foley was placed and nephrology was consulted. Patient continued to have worsening kidney function and hyperphosphatemia. Foley catheter was kept in for comfort at patient's request. Due to change in dispo plans, Foley was replaced on 10/12 with plan for voiding trial at SNF on 10/18.  Hypoxemia, resolved Patient required 2L of oxygen during the middle of his hospital stay due to oxygen saturations dropping to the 80s on room air.  Patient improved without further intervention, required no more oxygen during admission.  Elevated troponin, resolved During admission patient noted to have elevated troponin of 156 which increased from 10.  Patient denied chest and abdominal pain, physical exam unremarkable.  At this time patient has elevated CK over 6000, which likely contributed to this elevated troponin.  Elevated transaminases  hyperbilirubinemia, stable Patient was noted to have extreme elevations in AST.  Hepatitis panel was negative.  Likely due to hepatic metastatic neuroendocrine tumor, oncologist followed the patient during admission.

## 2020-06-11 NOTE — Care Management Important Message (Signed)
Important Message  Patient Details  Name: Charles Schmidt MRN: [id number] Date of Birth: Sep 10, 1941   Medicare Important Message Given:  Yes     Shelda Altes 06/11/2020, 9:30 AM

## 2020-06-11 NOTE — Progress Notes (Signed)
PT Cancellation Note  Patient Details Name: Charles Schmidt MRN: [id number] DOB: 1941-02-05   Cancelled Treatment:     pt states he is too tired, family requesting to return at a later time, will attempt time permitting in the PM   [place] 06/11/2020, 11:12 AM

## 2020-06-11 NOTE — Progress Notes (Signed)
Family Medicine Teaching Service Daily Progress Note Intern Pager: [phone]  Patient name: Charles Schmidt Medical record number: [id number] Date of birth: 1941/01/23 Age: 79 y.o. Gender: male  Primary Care Provider: Nolene Ebbs, MD Consultants: Oncology, Palliative, Urology, Nephrology Code Status: DNR   Pt Overview and Major Events to Date:  9/14: Admitted for weakness, vomiting, diarrhea. Oncology consulted 9/15: Extreme elevation in AST, GI and surgery consulted, CTA abd/pelvis obtained 9/16: Developed urinary retention and AKI, foley placed, urology and nephrology consulted 9/17: Worsening kidney function and hyperphosphatemia  Assessment and Plan: Charles Woodsonis a 79 y.o.malepresenting with weakness in the setting of vomiting and diarrhea, subsequently found to have rhabdomyolysis.PMH is significant for metastatic carcinoid tumor, BPH s/p laser vaporization, and HTN.  AKI in the setting of rhabdo, hypotension, contrast exposure and urinary obstruction 2/2 BPH   Hyperphosphatemia Labs this morning show downtrend of CK from 1194>1740.  Patient continues to have anuria/oliguria with 191mL of output over the last 24 hours.  Overnight he was given 75cc/hr x 12h, with no improvement in kidney function.Patient is not a candidate for dialysis.  Labs this morning show worsening creatinine from 5.76>6.14 and increase of phosphorus from 8.6>8.9, and a GFR that is now 8.  If stable, will continue to encourage PO. Palliative continues family discussion. -Nephro no longer formally following. Refer to prior notes -Palliative following, appreciate theurassistance with this difficult situtaion -Oncology following, Dr. Learta Codding saw patient this morning with family - Foley in place - Strict Is/Os - PO as tolerated  Goals of Care Patient with metastatic carcinoid tumor and worsening kidney function. Failure to thrive currently with weakness, no urine output, and limited PO intake. Will continue  to follow up. -Palliative care following, nephrology has signed off as they can no longer assist much as patient is not a candidate for dialysis  Metastatic Carcinoid Tumor Patient with hx of carcinoid tumor with mesenteric mass and innumerable hepatic metastasis. Had been receiving monthly sandostatin prior to admission. -Oncology following, appreciate recommendations -Palliative consulted for [place] discussion, appreciate recommendations  Diarrhea, Poor PO intake likely 2/2 Metastatic Carcinoid tumor, stable Patient continues to have diarrhea. Suspect it's secondary to carcinoid syndrome. Infectious causes ruled out.   - continue octreotide  - follow up Dr. Veryl Speak, consider additional agent to help with diarrhea   Hiccups: Continues to have hiccups. May be related to uremia vs known cancer. Can consider muscle relaxer vs PPI. - consider PO Protonix - Zanaflex at 2mg  q8 PRN for hiccups   FEN/GI: Renal diet PPx: Renally adjusted Lovenox  Status is: Inpatient Remains inpatient appropriate because:Persistent severe electrolyte disturbances, Unsafe d/c plan and Inpatient level of care appropriate due to severity of illness. [place] discussion ongoing  Dispo: The patient is from: Home  Anticipated d/c is to: SNF vs Hospice   Anticipated d/c date is: > 3 days  Patient currently is not medically stable to d/c.  Disposition: pending improvement vs GOC discussion to Hospice  Subjective:  Patient resting comfortably this morning with son at bedside.  Patient's oncologist came to see and talk with him and the family this morning.  Patient was less communicative but son says due to increased talking with the oncologist.  Patient repeatedly asked about dialysis and "have you seen someone is sick as me on dialysis?"  Though it appears that the family is aware he is not a candidate for dialysis.  Son says patient has been more alert and more talkative this  morning than previously.  Patient  reports no complaints at this time  Objective: Temp:  [97.6 F (36.4 C)-98.4 F (36.9 C)] 98 F (36.7 C) (09/20 0724) Pulse Rate:  [83-85] 84 (09/20 0724) Resp:  [20] 20 (09/19 2355) BP: (109-128)/(70-86) 114/74 (09/20 0724) SpO2:  [96 %-100 %] 96 % (09/20 0724) Weight:  [107.5 kg] 107.5 kg (09/20 0412) Physical Exam: General: pleasant thin elderly male, lying bed with son at bedside, appears weak and tired but NAD with nontoxic appearance CV: RRR without m/r/g,  2+ radial and pedal pulses bilaterally Resp: breathing comfortably on room air, speaking in full sentences Abd: Nontender, distended, firm with distention Neuro: Speech is slurred but at baseline, awake and alert  Laboratory: Recent Labs  Lab 06/05/20 0957 06/06/20 0537 06/07/20 0134  WBC 4.0 5.6 4.3  HGB 12.2* 12.8* 12.6*  HCT 37.2* 38.2* 36.1*  PLT 198 168 147*   Recent Labs  Lab 06/07/20 0134 06/07/20 0134 06/07/20 1143 06/07/20 1143 06/07/20 1859 06/07/20 1859 06/08/20 0259 06/08/20 0259 06/09/20 0551 06/10/20 1233 06/11/20 0457  NA 140   < > 145   < > 142   < > 142   < > 142 136 137  K 3.9   < > 4.2   < > 4.2   < > 3.9   < > 3.7 3.4* 3.6  CL 111   < > 115*   < > 113*   < > 112*   < > 112* 108 108  CO2 12*   < > 14*   < > 13*   < > 15*   < > 15* 13* 14*  BUN 23   < > 27*   < > 34*   < > 37*   < > 51* 63* 68*  CREATININE 2.05*   < > 2.53*   < > 3.01*   < > 3.44*   < > 5.04* 5.76* 6.14*  CALCIUM 7.4*   < > 7.1*   < > 7.1*   < > 7.0*   < > 7.2* 7.3* 7.5*  PROT 6.1*   < > 5.5*  --  5.6*  --  5.7*  --   --   --   --   BILITOT 1.8*   < > 1.4*  --  1.5*  --  1.5*  --   --   --   --   ALKPHOS 357*   < > 289*  --  287*  --  273*  --   --   --   --   ALT 101*   < > 81*  --  85*  --  84*  --   --   --   --   AST 879*  --   --   --  339*  --  259*  --   --   --   --   GLUCOSE 78   < > 64*   < > 86   < > 92   < > 102* 111* 91   < > = values in this interval not displayed.      Imaging/Diagnostic Tests: No results found. Rise Patience, DO 06/11/2020, 8:48 AM PGY-1, [place] Intern pager: [phone], text pages welcome

## 2020-06-11 NOTE — Progress Notes (Signed)
FPTS Interim Progress Note  Went to the patient room to speak to the patient and the family at the bedside. We spoke extensively about the patient's condition and specifically that the patient is not a candidate for dialysis per nephrology.  There seems to be some miscommunication with some members of the family.  On 9/19, nephrology spoke with the family at the worsening renal function and there was in agreement with plans for palliative care if the kidneys did not improve with supportive care.  As there was no improvement today, the patient and family were updated about this news. Dr. Benay Spice follows the patient with oncology and had a family meeting this morning.  Palliative care has already been on board; I spoke with them today and they appeared to be waiting for a note from oncology about what the plan was with the family and the decisions for further goals of treatment.  The son and daughter in the room stated that they had not come to a decision yet.  The patient appears to be understanding of the situation, and states that he is the only one who can make the decision about what happens to him.  The family would like further information and answers to their questions from nephrology specifically.  I spoke with nephrology Dr. Marval Regal who stated that the family has been extensively educated by his partners and by palliative care, but that he will come see the family if he is able to today.  Likely needs to be further talks with palliative care.  At this time there has been no decision made.   Rise Patience, D.O.  PGY-1  Family Medicine  [phone] 06/11/2020 3:24 PM

## 2020-06-11 NOTE — Progress Notes (Signed)
[facility]  Tentative referral received for residential hospice on Friday. Appears that family is waiting to hear from oncology prior to making any decisions.  [facility] does not have any further beds today.  Please reach out once the family has made a decision for full comfort.  Venia Carbon RN, BSN, [facility] Liaison (in [address] under Hospice)

## 2020-06-11 NOTE — Plan of Care (Signed)
  Problem: Clinical Measurements: Goal: Respiratory complications will improve Outcome: Progressing   Problem: Activity: Goal: Risk for activity intolerance will decrease Outcome: Progressing   Problem: Coping: Goal: Level of anxiety will decrease Outcome: Progressing   Problem: Pain Managment: Goal: General experience of comfort will improve Outcome: Progressing   Problem: Safety: Goal: Ability to remain free from injury will improve Outcome: Progressing

## 2020-06-11 NOTE — Progress Notes (Signed)
I was asked to speak with one of his sons, and one of his daughters regarding his candidacy for dialysis.  I have reviewed his history as well as well as notes from his primary oncologist.  Charles Schmidt has had a continued decline in his nutritional and functional status.  He also has multiple irreversible co-morbidities and told his children that he would not benefit from hemodialysis, nor do I think he could tolerate it given his ongoing hypotension, and severe protein malnutrition (albumin 1.6).  His son believes that this was all related to things that he was given via his IV's during this hospitalization despite his decline starting over the weeks prior to admission.  I concur with Drs. Charles Schmidt and Charles Schmidt that he is not a suitable candidate for dialysis.  They acknowledged understanding.  Agree with hospice and focus on comfort.

## 2020-06-11 NOTE — Progress Notes (Addendum)
IP PROGRESS NOTE   Subjective:   Remains weak.  Still with loose stool.  Son and daughter are at the bedside speaking with resident.  They continue to have questions regarding his candidacy for hemodialysis and do not feel that they have a good understanding as to why he is not a candidate.  Objective: Vital signs in last 24 hours: Blood pressure 93/69, pulse 84, temperature 98 F (36.7 C), temperature source Oral, resp. rate 20, height 6' 4" (1.93 m), weight 107.5 kg, SpO2 96 %.  Intake/Output from previous day: 09/19 0701 - 09/20 0700 In: 1219.5 [P.O.:480; I.V.:739.5] Out: 150 [Urine:150]  Physical Exam: HEENT: No thrush Lungs: Clear bilaterally Cardiac: Regular rate and rhythm Abdomen: Marked hepatomegaly extending into the left abdomen Extremities: No leg edema Neurologic: Alert, follows commands  Lab Results: No results for input(s): WBC, HGB, HCT, PLT in the last 72 hours.  BMET Recent Labs    06/10/20 1233 06/11/20 0457  NA 136 137  K 3.4* 3.6  CL 108 108  CO2 13* 14*  GLUCOSE 111* 91  BUN 63* 68*  CREATININE 5.76* 6.14*  CALCIUM 7.3* 7.5*    No results found for: CEA1  Studies/Results: No results found. CT images reviewed  Medications: I have reviewed the patient's current medications.  Assessment/Plan: 1.Metastatic carcinoid tumor, biopsy of a liver lesion 10/23/2017 consistent with a well-differentiated neuroendocrine neoplasm, WHO grade 2,ki-6710%  CT abdomen/pelvis 10/13/2017-extensive liver metastases, cirrhosis, soft tissue mass in the small bowel mesentery versus a primary small bowel tumor, tiny lucent lesions in the pelvic bones  Elevated chromogranin A and 24-hour urine 5-HIAA  CT chest 11/24/2017-subpleural nodule in the left lower lobe with associated radiotracer activity on comparison DOTATATEPET scan. No additional evidence of thoracic metastasis.  DOTATATE PET scan3/01/2018-intense radiotracer accumulation with innumerable confluent  hepatic metastasis; intense radiotracer activity within central mesenteric mass; 2 foci of uptake associated with the small bowel; intense radiotracer activity associated periaortic and paraspinal lymph nodes; more distant solitary small metastasis within the pleural space left lower lobe.  Monthly Sandostatin initiated 11/26/2017, dose increased to 30 mg 01/21/2018  Cycle 1 Lutathera 03/03/2018, monthly Sandostatin continued  Cycle 2 Lutathera 04/27/2018, monthly Sandostatin continued  Cycle 3 Lutathera 06/23/2018  Cycle 4 Lutathera 08/18/2018  Restaging dotatate PET scan 09/29/2018-multiple lesions again demonstrated within the liver which accumulate the radiotracer. The number of lesions which accumulate the tracer has decreased visually compared to the prior exam. Some lesions are no longer evident along the right hepatic margin. Remaining measurable lesions have radiotracer activity similar to prior. No new lesions present. Central mesenteric mass unchanged and remains avid for radiotracer. Adjacent lesion within the small bowel SUV max equal 10.5 compared to SUV max equal 15.8. Smaller lesion previously identified in the upper pelvis small bowel appears decreased in activity. No new metastatic lesions. Lesion position between the right psoas muscle and spine SUV max equal to 21.8 compared with SUV max equal 26.2.  Dotatate PET 10/19/2019-no evidence of disease progression, stable multifocal hepatic metastases, unchanged from post therapy scan 03/30/2019 and improved from pretherapy scan5/01/2018. Stable mesenteric mass, improved retroperitoneal lymph node adjacent to the celiac trunk, solitary small bowel lesion unchanged  Monthly Sandostatin continued  01/26/2020 chromogranin A level stable elevation  CT abdomen/pelvis 06/05/2020-slight enlargement of central mesenteric mass, extensive hepatic metastases-unchanged, bowel wall thickening in central small bowel loops and right  colon  2.Diarrhea-likely secondary to carcinoid syndrome, improved with Sandostatin 3.History of anorexia/weight loss-improved 4.Prostatic hypertrophy;status post laser vaporization 01/05/2018  5. Mild pancytopenia following Lutathera treatment 6.Thoracic dermatomal zoster rash 04/02/2019 treated with Valtrex 7.  Admission 06/05/2020 after a fall with failure to thrive, increased diarrhea, nausea, and abdominal pain 8.  Markedly elevated AST and CPK-likely rhabdomyolysis  Mr. Salceda appears stable today.  Still having loose stools.  Will increase octreotide to 150 mg every 8 hours.  Renal function continues to worsen.  Nephrology indicates that he is not a candidate for hemodialysis.  They have signed off.  The family still continues to have questions regarding his renal function.  Family meeting completed this morning.  Explained to the patient and his family that if his renal function does not improve, he would be a candidate for hospice.  Family not yet ready to make a decision regarding this as they still have questions for nephrology.   Recommendations: 1.  Increase octreotide to 150 mg every 8 hours. 2.  Discussed with resident who will reach back out to nephrology to see if they can answer additional questions from family. 3.  Continue goals of care discussion and patient would be a candidate for hospice if renal function continues to worsen.   LOS: 6 days   [name], NP   06/11/2020, 3:35 PM Mr. Botz was interviewed and examined.  Multiple family members were at the bedside when I saw him this morning.  He continues to feel "weak ".  He has frequent diarrhea despite the octreotide.  He was admitted with failure to thrive and developed acute renal failure.  The renal failure is likely secondary to dehydration/hypotension, rhabdomyolysis, and CT contrast.  His family is hopeful for improvement in the renal failure.  Mr. Vitelli and his family understand no therapy  will be curative for the metastatic carcinoid tumor.  The goal is to control diarrhea with somatostatin analogs.  We increased the octreotide dose today.  The plan is to resume a long-acting somatostatin drug as an outpatient.  Mr. Gaspard will be a candidate for residential hospice if the renal function does not improve.  He may be a candidate for as needed temporary hemodialysis if he has improvement in renal function.

## 2020-06-12 DIAGNOSIS — L8990 Pressure ulcer of unspecified site, unspecified stage: Secondary | ICD-10-CM | POA: Insufficient documentation

## 2020-06-12 LAB — RENAL FUNCTION PANEL
Albumin: 1.6 g/dL — ABNORMAL LOW (ref 3.5–5.0)
Anion gap: 16 — ABNORMAL HIGH (ref 5–15)
BUN: 78 mg/dL — ABNORMAL HIGH (ref 8–23)
CO2: 13 mmol/L — ABNORMAL LOW (ref 22–32)
Calcium: 7.7 mg/dL — ABNORMAL LOW (ref 8.9–10.3)
Chloride: 109 mmol/L (ref 98–111)
Creatinine, Ser: 6.51 mg/dL — ABNORMAL HIGH (ref 0.61–1.24)
GFR calc Af Amer: 9 mL/min — ABNORMAL LOW (ref >60)
GFR calc non Af Amer: 7 mL/min — ABNORMAL LOW (ref >60)
Glucose, Bld: 114 mg/dL — ABNORMAL HIGH (ref 70–99)
Phosphorus: 9 mg/dL — ABNORMAL HIGH (ref 2.5–4.6)
Potassium: 4.1 mmol/L (ref 3.5–5.1)
Sodium: 138 mmol/L (ref 135–145)

## 2020-06-12 LAB — CBC
HCT: 32.3 % — ABNORMAL LOW (ref 39.0–52.0)
Hemoglobin: 11.6 g/dL — ABNORMAL LOW (ref 13.0–17.0)
MCH: 25.8 pg — ABNORMAL LOW (ref 26.0–34.0)
MCHC: 35.9 g/dL (ref 30.0–36.0)
MCV: 71.9 fL — ABNORMAL LOW (ref 80.0–100.0)
Platelets: 112 10*3/uL — ABNORMAL LOW (ref 150–400)
RBC: 4.49 MIL/uL (ref 4.22–5.81)
RDW: 19.7 % — ABNORMAL HIGH (ref 11.5–15.5)
WBC: 6.6 10*3/uL (ref 4.0–10.5)
nRBC: 0.6 % — ABNORMAL HIGH (ref 0.0–0.2)

## 2020-06-12 LAB — MAGNESIUM: Magnesium: 2.3 mg/dL (ref 1.7–2.4)

## 2020-06-12 NOTE — Progress Notes (Addendum)
IP PROGRESS NOTE   Subjective:   The patient is resting quietly.  Daughter is at the bedside.  Daughter states that he was able to sit up on side of bed and work with physical therapy some today.  She thinks his lower extremity edema has improved and also notices that he has more urine output that he has had previously.  However, urine output is not recorded in the I's & O's today.  Still with loose stools.  Objective: Vital signs in last 24 hours: Blood pressure 123/78, pulse 84, temperature 97.7 F (36.5 C), temperature source Oral, resp. rate 20, height 6' 4"  (1.93 m), weight 106.6 kg, SpO2 96 %.  Intake/Output from previous day: 09/20 0701 - 09/21 0700 In: 760 [P.O.:760] Out: 275 [Urine:275]  Physical Exam: HEENT: No thrush Lungs: Clear bilaterally Cardiac: Regular rate and rhythm Abdomen: Marked hepatomegaly extending into the left abdomen Extremities: Trace leg edema Neurologic: Alert, follows commands  Lab Results: Recent Labs    06/12/20 1146  WBC 6.6  HGB 11.6*  HCT 32.3*  PLT 112*    BMET Recent Labs    06/11/20 0457 06/12/20 1146  NA 137 138  K 3.6 4.1  CL 108 109  CO2 14* 13*  GLUCOSE 91 114*  BUN 68* 78*  CREATININE 6.14* 6.51*  CALCIUM 7.5* 7.7*    No results found for: CEA1  Studies/Results: No results found. CT images reviewed  Medications: I have reviewed the patient's current medications.  Assessment/Plan: 1.Metastatic carcinoid tumor, biopsy of a liver lesion 10/23/2017 consistent with a well-differentiated neuroendocrine neoplasm, WHO grade 2,ki-6710%  CT abdomen/pelvis 10/13/2017-extensive liver metastases, cirrhosis, soft tissue mass in the small bowel mesentery versus a primary small bowel tumor, tiny lucent lesions in the pelvic bones  Elevated chromogranin A and 24-hour urine 5-HIAA  CT chest 11/24/2017-subpleural nodule in the left lower lobe with associated radiotracer activity on comparison DOTATATEPET scan. No additional  evidence of thoracic metastasis.  DOTATATE PET scan3/01/2018-intense radiotracer accumulation with innumerable confluent hepatic metastasis; intense radiotracer activity within central mesenteric mass; 2 foci of uptake associated with the small bowel; intense radiotracer activity associated periaortic and paraspinal lymph nodes; more distant solitary small metastasis within the pleural space left lower lobe.  Monthly Sandostatin initiated 11/26/2017, dose increased to 30 mg 01/21/2018  Cycle 1 Lutathera 03/03/2018, monthly Sandostatin continued  Cycle 2 Lutathera 04/27/2018, monthly Sandostatin continued  Cycle 3 Lutathera 06/23/2018  Cycle 4 Lutathera 08/18/2018  Restaging dotatate PET scan 09/29/2018-multiple lesions again demonstrated within the liver which accumulate the radiotracer. The number of lesions which accumulate the tracer has decreased visually compared to the prior exam. Some lesions are no longer evident along the right hepatic margin. Remaining measurable lesions have radiotracer activity similar to prior. No new lesions present. Central mesenteric mass unchanged and remains avid for radiotracer. Adjacent lesion within the small bowel SUV max equal 10.5 compared to SUV max equal 15.8. Smaller lesion previously identified in the upper pelvis small bowel appears decreased in activity. No new metastatic lesions. Lesion position between the right psoas muscle and spine SUV max equal to 21.8 compared with SUV max equal 26.2.  Dotatate PET 10/19/2019-no evidence of disease progression, stable multifocal hepatic metastases, unchanged from post therapy scan 03/30/2019 and improved from pretherapy scan5/01/2018. Stable mesenteric mass, improved retroperitoneal lymph node adjacent to the celiac trunk, solitary small bowel lesion unchanged  Monthly Sandostatin continued  01/26/2020 chromogranin A level stable elevation  CT abdomen/pelvis 06/05/2020-slight enlargement of central mesenteric  mass,  extensive hepatic metastases-unchanged, bowel wall thickening in central small bowel loops and right colon  2.Diarrhea-likely secondary to carcinoid syndrome, improved with Sandostatin 3.History of anorexia/weight loss-improved 4.Prostatic hypertrophy;status post laser vaporization 01/05/2018 5. Mild pancytopenia following Lutathera treatment 6.Thoracic dermatomal zoster rash 04/02/2019 treated with Valtrex 7.  Admission 06/05/2020 after a fall with failure to thrive, increased diarrhea, nausea, and abdominal pain 8.  Markedly elevated AST and CPK-likely rhabdomyolysis 9.  Acute renal failure  Charles Schmidt appears stable today.  Still having loose stools.  Octreotide was increased on 9/20 to 100 mg every 8 hours.  Renal function slightly worsened today but making more urine.  Nephrology has signed off.  They have stated that he is not a candidate for hemodialysis.  We will continue to monitor renal function closely.  Family still waiting to see if the patient will have some improvement of his renal function.  Remains undecided about SNF versus hospice.   Recommendations: 1.  Continue octreotide 150 mg every 8 hours. 2.  Continue goals of care discussion and patient would be a candidate for hospice if renal function continues to worsen.   LOS: 7 days   [name], NP   06/12/2020, 2:45 PM Mr. Vincelette was interviewed and examined.  He continues to have frequent bowel movements, but the nurse this morning said the stool is formed.  He will continue octreotide at the current dose.  He has acute renal failure.  Urine output has improved slightly.  Nephrology indicates he is not a dialysis candidate.  Julieanne Manson, MD

## 2020-06-12 NOTE — Progress Notes (Signed)
Manufacturing engineer [facility]) Hospital Liaison note.   Received request from [facility] manager for possible family interest in [facility] if hospice care is desired. [place] liaisons will continue to follow for discharge planning as appropriate.  Thank you for the opportunity to participate in this patient's care.  Chrislyn Edison Pace, BSN, RN [place] (listed on [place] under Hospice/[facility])    [phone]

## 2020-06-12 NOTE — Progress Notes (Signed)
Palliative Medicine RN Note: Chart checked/reviewed. Note Drs Benay Spice and Coladon[place] spoke with Mr Fitzhenry family yesterday. I will have a PMT provider follow up as soon as one is available, likely tomorrow.  Marjie Skiff Jenevieve Kirschbaum, RN, BSN, [facility] Palliative Medicine Team 06/12/2020 2:24 PM Office [phone]

## 2020-06-12 NOTE — Progress Notes (Signed)
Family Medicine Teaching Service Daily Progress Note Intern Pager: [phone]  Patient name: Charles Schmidt Medical record number: [id number] Date of birth: [birth year] Age: 79 y.o. Gender: male  Primary Care Provider: Nolene Ebbs, MD Consultants: Oncology, Palliative, Urology, Nephrology Code Status: DNR   Pt Overview and Major Events to Date:  9/14: Admitted for weakness, vomiting, diarrhea. Oncology consulted 9/15: Extreme elevation in AST, GI and surgery consulted, CTA abd/pelvis obtained 9/16: Developed urinary retention and AKI, foley placed, urology and nephrology consulted 9/17: Worsening kidney function and hyperphosphatemia  Assessment and Plan: Charles Woodsonis a 79 y.o.malepresentingpresenting with weakness in the setting of vomiting and diarrhea, subsequently found to have rhabdomyolysis.PMH is significant for metastatic carcinoid tumor, BPH s/p laser vaporization, and HTN.  AKI in the setting of rhabdo, hypotension, contrast exposure and urinary obstruction 2/2 BPH  Hyperphosphatemia Labs this morning show creatinine of 6.51, phosphorus of 9, GFR 7. patient continues to have decreased urine with 259mL of output over the last 24 hours. Palliative continues family discussion. Patient is not a candidate for dialysis, nephrology previously signed off but had a discussion with family members bedside on 9/20 to answer questions (see notes).  -Nephro no longer formally following. Refer to prior notes -Palliative following -Oncology following - Foley in place - Strict Is/Os - PO as tolerated  Goals of Care Patient with metastatic carcinoid tumor and worsening kidney function. Failure to thrive currently with weakness, no urine output, and limited PO intake. Will continue to follow up. -Palliative care following  Metastatic Carcinoid Tumor Patient with hx of carcinoid tumor with mesenteric mass and innumerable hepatic metastasis. Had been receiving monthly sandostatin prior to  admission. -Oncology following, appreciate recommendations -Palliative consulted for [place] discussion, appreciate recommendations  Diarrhea, Poor PO intake likely 2/2 Metastatic Carcinoid tumor, stable She continues to have diarrhea.  Suspect secondary to carcinoid syndrome.  Infectious diseases previously ruled out  - continue octreotide 150mg , monitor for improvement - follow up Dr. Veryl Schmidt, consider additional agent to help with diarrhea   Hiccups:  Has continuous hiccups.  May be related to uremia versus cancer - consider PO Protonix - Zanaflex at 2mg  q8 PRN for hiccups   FEN/GI: Renal diet PPx: Renally adjusted Lovenox  Status is: Inpatient Remains inpatient appropriate because:Persistent severe electrolyte disturbances, Unsafe d/c plan and Inpatient level of care appropriate due to severity of illness. [place] discussion ongoing  Dispo: The patient is from: Home  Anticipated d/c is to: SNF vs Hospice   Anticipated d/c date is: > 3 days  Patient currently is not medically stable to d/c.  Disposition: pending improvement vs GOC discussion to Hospice  Subjective:  Patient alone in room this morning, states that one of his children will be coming over today.  Patient reports that he has had no change, he reports none abdominal pain.  He states that he has not been eating as much because he does not want to have more diarrhea and trouble the nurses; but states that today he is going to try and start eating better.  Otherwise patient has no complaints today.  Objective: Temp:  [98 F (36.7 C)-99.2 F (37.3 C)] 98.5 F (36.9 C) (09/21 0400) Pulse Rate:  [84-85] 85 (09/21 0400) Resp:  [18-21] 18 (09/21 0400) BP: (93-121)/(69-82) 113/81 (09/21 0400) SpO2:  [94 %-96 %] 95 % (09/21 0400) Weight:  [106.6 kg] 106.6 kg (09/21 0400) Physical Exam: General: 79 elderly male, lying on bed with towel draped over head, appears weak and tired but  NAD, nontoxic CV: RRR without m/r/g,  2+ radial and pedal pulses bilaterally Resp: breathing comfortably on room air, speaking in full sentences Abd: Nontender, mildly distended, firm  Neuro: Speech is slurred but at baseline, awake and alert  Laboratory: Recent Labs  Lab 06/05/20 0957 06/06/20 0537 06/07/20 0134  WBC 4.0 5.6 4.3  HGB 12.2* 12.8* 12.6*  HCT 37.2* 38.2* 36.1*  PLT 198 168 147*   Recent Labs  Lab 06/07/20 0134 06/07/20 0134 06/07/20 1143 06/07/20 1143 06/07/20 1859 06/07/20 1859 06/08/20 0259 06/08/20 0259 06/09/20 0551 06/10/20 1233 06/11/20 0457  NA 140   < > 145   < > 142   < > 142   < > 142 136 137  K 3.9   < > 4.2   < > 4.2   < > 3.9   < > 3.7 3.4* 3.6  CL 111   < > 115*   < > 113*   < > 112*   < > 112* 108 108  CO2 12*   < > 14*   < > 13*   < > 15*   < > 15* 13* 14*  BUN 23   < > 27*   < > 34*   < > 37*   < > 51* 63* 68*  CREATININE 2.05*   < > 2.53*   < > 3.01*   < > 3.44*   < > 5.04* 5.76* 6.14*  CALCIUM 7.4*   < > 7.1*   < > 7.1*   < > 7.0*   < > 7.2* 7.3* 7.5*  PROT 6.1*   < > 5.5*  --  5.6*  --  5.7*  --   --   --   --   BILITOT 1.8*   < > 1.4*  --  1.5*  --  1.5*  --   --   --   --   ALKPHOS 357*   < > 289*  --  287*  --  273*  --   --   --   --   ALT 101*   < > 81*  --  85*  --  84*  --   --   --   --   AST 879*  --   --   --  339*  --  259*  --   --   --   --   GLUCOSE 78   < > 64*   < > 86   < > 92   < > 102* 111* 91   < > = values in this interval not displayed.     Imaging/Diagnostic Tests: No results found.   Charles Patience, DO 06/12/2020, 5:31 AM PGY-1, [place] Intern pager: [phone], text pages welcome

## 2020-06-12 NOTE — Progress Notes (Signed)
Physical Therapy Treatment Patient Details Name: Charles Schmidt MRN: [id number] DOB: Feb 09, 1941 Today's Date: 06/12/2020    History of Present Illness Charles Schmidt is a 79 y.o. male presenting with weakness in the setting of vomiting and diarrhea. PMH is significant for metastatic carcinoid tumor, BPH s/p laser vaporization, and HTN.  On precautions for Cdiff.    PT Comments    Pt with increased arousal; pt able to participate in bed mobility for hygiene; pt motivated to sit EOB and remain sitting to converse with daughter; pt continue to demonstrate deficits in strength, balance, coordination, gait and endurance and will benefit from skilled PT intervention to address deficits and maximize independence with functional mobility prior to discharge.      Follow Up Recommendations  Supervision/Assistance - 24 hour;SNF     Equipment Recommendations  Hospital bed    Recommendations for Other Services       Precautions / Restrictions Precautions Precautions: Other (comment);Fall Restrictions Weight Bearing Restrictions: No    Mobility  Bed Mobility Overal bed mobility: Needs Assistance Bed Mobility: Supine to Sit;Rolling Rolling: +2 for safety/equipment;+2 for physical assistance   Supine to sit: Mod assist (+2 for encouragement)     General bed mobility comments: pt required +2 to complete roll and maintain to fully complete hygiene; pt performed rolling multiple times; pt requiring +2 for supine>sit for encouragement, asking for daughter to assist Sitting EOB pt participated in scooting up in bed with weight bearing through B LE and pushing up with B UEs, min A needed from therapist for balance   Transfers                    Ambulation/Gait                 Stairs             Wheelchair Mobility    Modified Rankin (Stroke Patients Only)       Balance       Sitting balance - Comments: pt performed sitting EOB with feet supported and B UEs  on bed >5 minutes and performed seated exercises without LOB; pt requesting to remain sitting EOB at end of session wtih daugther present and RN notified                                    Cognition Arousal/Alertness: Awake/alert Behavior During Therapy: WFL for tasks assessed/performed Overall Cognitive Status: Within Functional Limits for tasks assessed                                 General Comments: pt more aroused wtih increased participation in session; +2 needed to increase pt's participation in session      Exercises General Exercises - Lower Extremity Long Arc Quad: AROM;Both;10 reps;Seated Hip Flexion/Marching: AROM;10 reps;Seated;Both    General Comments        Pertinent Vitals/Pain Pain Assessment: No/denies pain    Home Living                      Prior Function            PT Goals (current goals can now be found in the care plan section) Acute Rehab PT Goals Patient Stated Goal: to go home PT Goal Formulation: With patient Time For Goal Achievement: 06/22/20 Potential to Achieve  Goals: Good Progress towards PT goals: Progressing toward goals    Frequency    Min 3X/week      PT Plan Current plan remains appropriate    Co-evaluation              AM-PAC PT "6 Clicks" Mobility   Outcome Measure  Help needed turning from your back to your side while in a flat bed without using bedrails?: Total Help needed moving from lying on your back to sitting on the side of a flat bed without using bedrails?: A Lot Help needed moving to and from a bed to a chair (including a wheelchair)?: A Lot Help needed standing up from a chair using your arms (e.g., wheelchair or bedside chair)?: A Lot Help needed to walk in hospital room?: Total Help needed climbing 3-5 steps with a railing? : Total 6 Click Score: 9    End of Session Equipment Utilized During Treatment: Gait belt Activity Tolerance: Patient limited by  fatigue Patient left: with family/visitor present;with call bell/phone within reach;in bed (sitting EOB with daughter next to pt; RN notified of positiong) Nurse Communication: Mobility status PT Visit Diagnosis: Muscle weakness (generalized) (M62.81);Unsteadiness on feet (R26.81)     Time: 9798-9211 PT Time Calculation (min) (ACUTE ONLY): 29 min  Charges:  $Therapeutic Activity: 23-37 mins                     Lyanne Co, DPT Acute Rehabilitation Services [phone]   [name] 06/12/2020, 1:55 PM

## 2020-06-12 NOTE — Progress Notes (Addendum)
Nutrition Brief Note  Chart reviewed. Case discussed with MD, who reports pt has not yet transitioned to comfort care, however, may do so sometime this afternoon once palliative care sees pt and family. Pt is not an HD candidate per nephrology notes. Pt with limited oral intake secondary to diarrhea.  Case discussed with RN, who reports that family is still struggling with transition to hospice/ comfort care. Palliative care to meet with family today along with oncology. Diarrhea has improved and pt consuming mostly applesauce.  No further nutrition interventions warranted at this time.  Please re-consult as needed.   Loistine Chance, RD, LDN, [place] Registered Dietitian II Certified Diabetes Care and Education Specialist Please refer to [facility] for RD and/or RD on-call/weekend/after hours pager

## 2020-06-12 NOTE — Plan of Care (Signed)
  Problem: Coping: Goal: Level of anxiety will decrease Outcome: Progressing   Problem: Elimination: Goal: Will not experience complications related to urinary retention Outcome: Progressing   Problem: Safety: Goal: Ability to remain free from injury will improve Outcome: Progressing   

## 2020-06-13 LAB — RENAL FUNCTION PANEL
Albumin: 1.8 g/dL — ABNORMAL LOW (ref 3.5–5.0)
Anion gap: 16 — ABNORMAL HIGH (ref 5–15)
BUN: 80 mg/dL — ABNORMAL HIGH (ref 8–23)
CO2: 14 mmol/L — ABNORMAL LOW (ref 22–32)
Calcium: 7.8 mg/dL — ABNORMAL LOW (ref 8.9–10.3)
Chloride: 106 mmol/L (ref 98–111)
Creatinine, Ser: 6.3 mg/dL — ABNORMAL HIGH (ref 0.61–1.24)
GFR calc Af Amer: 9 mL/min — ABNORMAL LOW (ref >60)
GFR calc non Af Amer: 8 mL/min — ABNORMAL LOW (ref >60)
Glucose, Bld: 110 mg/dL — ABNORMAL HIGH (ref 70–99)
Phosphorus: 9.1 mg/dL — ABNORMAL HIGH (ref 2.5–4.6)
Potassium: 3.9 mmol/L (ref 3.5–5.1)
Sodium: 136 mmol/L (ref 135–145)

## 2020-06-13 LAB — PATHOLOGIST SMEAR REVIEW

## 2020-06-13 NOTE — Progress Notes (Signed)
IP PROGRESS NOTE   Subjective:  No complaint this morning.  No nausea or pain.  Diarrhea has improved.  Objective: Vital signs in last 24 hours: Blood pressure 93/68, pulse 79, temperature 98.3 F (36.8 C), temperature source Oral, resp. rate 16, height 6' 4"  (1.93 m), weight 235 lb (106.6 kg), SpO2 96 %.  Intake/Output from previous day: 09/21 0701 - 09/22 0700 In: 39 [P.O.:460] Out: 325 [Urine:325]  Physical Exam: HEENT: Mild thrush at the buccal mucosa  Abdomen: Marked hepatomegaly extending into the left abdomen Extremities: Trace leg edema Neurologic: Alert, follows commands  Lab Results: Recent Labs    06/12/20 1146  WBC 6.6  HGB 11.6*  HCT 32.3*  PLT 112*    BMET Recent Labs    06/12/20 1146 06/13/20 0606  NA 138 136  K 4.1 3.9  CL 109 106  CO2 13* 14*  GLUCOSE 114* 110*  BUN 78* 80*  CREATININE 6.51* 6.30*  CALCIUM 7.7* 7.8*    No results found for: CEA1  Studies/Results: No results found. CT images reviewed  Medications: I have reviewed the patient's current medications.  Assessment/Plan: 1.Metastatic carcinoid tumor, biopsy of a liver lesion 10/23/2017 consistent with a well-differentiated neuroendocrine neoplasm, WHO grade 2,ki-6710%  CT abdomen/pelvis 10/13/2017-extensive liver metastases, cirrhosis, soft tissue mass in the small bowel mesentery versus a primary small bowel tumor, tiny lucent lesions in the pelvic bones  Elevated chromogranin A and 24-hour urine 5-HIAA  CT chest 11/24/2017-subpleural nodule in the left lower lobe with associated radiotracer activity on comparison DOTATATEPET scan. No additional evidence of thoracic metastasis.  DOTATATE PET scan3/01/2018-intense radiotracer accumulation with innumerable confluent hepatic metastasis; intense radiotracer activity within central mesenteric mass; 2 foci of uptake associated with the small bowel; intense radiotracer activity associated periaortic and paraspinal lymph nodes; more  distant solitary small metastasis within the pleural space left lower lobe.  Monthly Sandostatin initiated 11/26/2017, dose increased to 30 mg 01/21/2018  Cycle 1 Lutathera 03/03/2018, monthly Sandostatin continued  Cycle 2 Lutathera 04/27/2018, monthly Sandostatin continued  Cycle 3 Lutathera 06/23/2018  Cycle 4 Lutathera 08/18/2018  Restaging dotatate PET scan 09/29/2018-multiple lesions again demonstrated within the liver which accumulate the radiotracer. The number of lesions which accumulate the tracer has decreased visually compared to the prior exam. Some lesions are no longer evident along the right hepatic margin. Remaining measurable lesions have radiotracer activity similar to prior. No new lesions present. Central mesenteric mass unchanged and remains avid for radiotracer. Adjacent lesion within the small bowel SUV max equal 10.5 compared to SUV max equal 15.8. Smaller lesion previously identified in the upper pelvis small bowel appears decreased in activity. No new metastatic lesions. Lesion position between the right psoas muscle and spine SUV max equal to 21.8 compared with SUV max equal 26.2.  Dotatate PET 10/19/2019-no evidence of disease progression, stable multifocal hepatic metastases, unchanged from post therapy scan 03/30/2019 and improved from pretherapy scan5/01/2018. Stable mesenteric mass, improved retroperitoneal lymph node adjacent to the celiac trunk, solitary small bowel lesion unchanged  Monthly Sandostatin continued  01/26/2020 chromogranin A level stable elevation  CT abdomen/pelvis 06/05/2020-slight enlargement of central mesenteric mass, extensive hepatic metastases-unchanged, bowel wall thickening in central small bowel loops and right colon  2.Diarrhea-likely secondary to carcinoid syndrome, improved with Sandostatin 3.History of anorexia/weight loss-improved 4.Prostatic hypertrophy;status post laser vaporization 01/05/2018 5. Mild pancytopenia  following Lutathera treatment 6.Thoracic dermatomal zoster rash 04/02/2019 treated with Valtrex 7.  Admission 06/05/2020 after a fall with failure to thrive, increased diarrhea, nausea, and  abdominal pain 8.  Markedly elevated AST and CPK-likely rhabdomyolysis 9.  Acute renal failure   Charles Schmidt is alert and appears to have an improved performance status.  Diarrhea has improved with the increased dose of octreotide.  His renal function has stabilized with increased urine output.  He was able to participate with physical therapy yesterday.  Hopefully the renal function will improve over the next several days.  Recommendations: 1.  Continue octreotide 150 mg every 8 hours, resume long-acting Sandostatin or lanreotide at discharge 2.  Continue physical therapy 3.  Skilled nursing facility placement versus home with 24-hour care 4.  Check platelet count tomorrow   LOS: 8 days   Betsy Coder, MD   06/13/2020, 1:48 PM

## 2020-06-13 NOTE — Plan of Care (Signed)
  Problem: Clinical Measurements: Goal: Respiratory complications will improve Outcome: Progressing   Problem: Coping: Goal: Level of anxiety will decrease Outcome: Progressing   

## 2020-06-13 NOTE — Progress Notes (Signed)
Family Medicine Teaching Service Daily Progress Note Intern Pager: [phone]  Patient name: Charles Schmidt Medical record number: [id number] Date of birth: [birth year] Age: 79 y.o. Gender: male  Primary Care Provider: Nolene Ebbs, MD Consultants: Oncology, Palliative, Urology, Nephrology Code Status: DNR   Pt Overview and Major Events to Date:  9/14: Admitted for weakness, vomiting, diarrhea. Oncology consulted 9/15: Extreme elevation in AST, GI and surgery consulted, CTA abd/pelvis obtained 9/16: Developed urinary retention and AKI, foley placed, urology and nephrology consulted 9/17: Worsening kidney function and hyperphosphatemia  Assessment and Plan: Charles Woodsonis a 79 y.o.malepresenting with weakness in the setting of vomiting and diarrhea, subsequently found to have rhabdomyolysis.PMH is significant for metastatic carcinoid tumor, BPH s/p laser vaporization, and HTN.  AKI in the setting of rhabdo, hypotension, contrast exposure and urinary obstruction 2/2 BPH  Hyperphosphatemia Labs this morning are shown no true renal improvement; creatinine 6.3, BUN 80, phosphorus 9.1, GFR 8.  Patient continues to have 0.1 mL/kg/hr of urine output over the last 24 hours.  Palliative is continuing discussions.   - Nephro no longer formally following. Refer to prior notes - Palliative following - Oncology following - Foley in place - Strict Is/Os - PO as tolerated -Continue Renvela 800 mg 3 times daily -Continue p.o. bicarb per nephro  Goals of Care Patient with metastatic carcinoid tumor and worsening kidney function. Failure to thrive currently with weakness, no urine output, and limited PO intake. Will continue to follow up. -Palliative care following  Metastatic Carcinoid Tumor Patient with hx of carcinoid tumor with mesenteric mass and innumerable hepatic metastasis. Had been receiving monthly sandostatin prior to admission. -Oncology following, appreciate  recommendations -Palliative consulted for [place] discussion, appreciate recommendations  Diarrhea, Poor PO intake likely 2/2 Metastatic Carcinoid tumor, improving Patient had only 4 episodes of diarrhea yesterday, which is an improvement and similar to his baseline at home.  - continue octreotide 150mg , monitor for improvement - follow up Dr. Veryl Schmidt, consider additional agent to help with diarrhea   Hiccups:  Has continuous hiccups.  May be related to uremia versus cancer - consider PO Protonix - Zanaflex at 2mg  q8 PRN for hiccups   FEN/GI: Renal diet PPx: Renally adjusted Lovenox  Status is: Inpatient Remains inpatient appropriate because:Persistent severe electrolyte disturbances, Unsafe d/c plan and Inpatient level of care appropriate due to severity of illness. [place] discussion ongoing  Dispo: The patient is from: Home  Anticipated d/c is to: SNF vs Hospice   Anticipated d/c date is: > 3 days  Patient currently is not medically stable to d/c.  Disposition: pending improvement vs GOC discussion to Hospice  Subjective:  Patient reports that he would like to go home with 24/7 assistance at his house.  He states that he needs to football players to help him around the house.  Daughter was in room and prior to this it was thought he was either going to hospice or SNF, still completely unsure of disposition plan.   Objective: Temp:  [97.7 F (36.5 C)-98.6 F (37 C)] 98.6 F (37 C) (09/22 0446) Pulse Rate:  [80-87] 80 (09/21 1940) Resp:  [17-20] 17 (09/22 0400) BP: (96-130)/(71-88) 130/85 (09/22 0400) SpO2:  [94 %-98 %] 98 % (09/22 0400) Physical Exam: General: Thin elderly male, daughter at bedside, NAD, hiccuping CV: RRR without m/r/g,  2+ radial and pedal pulses bilaterally Resp: Comfortably on room air, speaking full sentences Abd: Firm, mildly distended, mildly tender Neuro: Speech is slurred at baseline, awake and alert,  conversive  Laboratory: Recent Labs  Lab 06/07/20 0134 06/12/20 1146  WBC 4.3 6.6  HGB 12.6* 11.6*  HCT 36.1* 32.3*  PLT 147* 112*   Recent Labs  Lab 06/07/20 0134 06/07/20 0134 06/07/20 1143 06/07/20 1143 06/07/20 1859 06/07/20 1859 06/08/20 0259 06/09/20 0551 06/10/20 1233 06/11/20 0457 06/12/20 1146  NA 140   < > 145   < > 142   < > 142   < > 136 137 138  K 3.9   < > 4.2   < > 4.2   < > 3.9   < > 3.4* 3.6 4.1  CL 111   < > 115*   < > 113*   < > 112*   < > 108 108 109  CO2 12*   < > 14*   < > 13*   < > 15*   < > 13* 14* 13*  BUN 23   < > 27*   < > 34*   < > 37*   < > 63* 68* 78*  CREATININE 2.05*   < > 2.53*   < > 3.01*   < > 3.44*   < > 5.76* 6.14* 6.51*  CALCIUM 7.4*   < > 7.1*   < > 7.1*   < > 7.0*   < > 7.3* 7.5* 7.7*  PROT 6.1*   < > 5.5*  --  5.6*  --  5.7*  --   --   --   --   BILITOT 1.8*   < > 1.4*  --  1.5*  --  1.5*  --   --   --   --   ALKPHOS 357*   < > 289*  --  287*  --  273*  --   --   --   --   ALT 101*   < > 81*  --  85*  --  84*  --   --   --   --   AST 879*  --   --   --  339*  --  259*  --   --   --   --   GLUCOSE 78   < > 64*   < > 86   < > 92   < > 111* 91 114*   < > = values in this interval not displayed.     Imaging/Diagnostic Tests: No results found.   Charles Patience, DO 06/13/2020, 5:39 AM PGY-1, [place] Intern pager: [phone], text pages welcome

## 2020-06-13 NOTE — Progress Notes (Signed)
Palliative:  HPI: 79 y.o.malepresenting with weakness in the setting of vomiting and diarrhea.PMH is significant for metastatic carcinoid tumor, BPH s/p laser vaporization, and HTN. Palliative care was asked to get involved to aid in goals of care conversations in the setting of acute illness, renal failure, and declining health state. He is followed by OP Oncology - Dr. Benay Spice for metastatic carcinoid tumor.  I met today at [name]'s bedside along with his daughter, Linwood Dibbles. Mr. Kiel is lethargic but interactive and talking. His voice is mumbled and very difficult to understand. He talks about hospice and skilled nursing facilities and going home. We believe that he is telling us that he wishes to return home. We did discuss briefly that he would need 24/7 care in his weakened state and he would be eligible for hospice support at home but they do not provide 24/7 care. This care is usually provided by family members or privately hired caregivers. I wanted to give her this heads up so they know what is required upfront if home is their desire.   I ask further about where we stand and how I can help [place] move forward. Linwood Dibbles reports that family was hoping to have further input from Dr. Benay Spice and then may be interested in all meeting with me to further discuss options. (I did update Dr. Benay Spice who plans to be at the bedside tomorrow ~0645 am and I did pass this on to [facility] who plans to be present at bedside with her 2 siblings - RN aware).   Linwood Dibbles also asked about hospice facility and we did clarify that expected prognosis for hospice facility is < 2 weeks. However, if patients live longer they stay there if they continue to decline. There are a percentage of patients that leave hospice facility and they have social workers to assist with next steps if this happens.   I provided Linwood Dibbles with my contact and informed her that I will continue to follow and assist them as they wish.    All questions/concerns addressed. Emotional support provided.   Exam: Alert, unable to fully assess orientation due to difficulty understanding. No distress. Extremely weak. Lethargic.   Plan: - Ongoing goals of care conversation. Patient and family considering hospice options as well. Patient actually brought up hospice in conversation today.  - Decline complicated by renal failure with underlying metastatic cancer.  - Diarrhea: Seems improved today. No BM at least 0730 am until 1100 am. Continue octreotide.   [place], NP Palliative Medicine Team Pager [phone] (Please see [email or web address] for schedule) Team Phone [phone]    Greater than 50%  of this time was spent counseling and coordinating care related to the above assessment and plan

## 2020-06-14 LAB — BASIC METABOLIC PANEL
Anion gap: 15 (ref 5–15)
BUN: 81 mg/dL — ABNORMAL HIGH (ref 8–23)
CO2: 15 mmol/L — ABNORMAL LOW (ref 22–32)
Calcium: 7.7 mg/dL — ABNORMAL LOW (ref 8.9–10.3)
Chloride: 106 mmol/L (ref 98–111)
Creatinine, Ser: 5.98 mg/dL — ABNORMAL HIGH (ref 0.61–1.24)
GFR calc Af Amer: 10 mL/min — ABNORMAL LOW (ref >60)
GFR calc non Af Amer: 8 mL/min — ABNORMAL LOW (ref >60)
Glucose, Bld: 126 mg/dL — ABNORMAL HIGH (ref 70–99)
Potassium: 3.6 mmol/L (ref 3.5–5.1)
Sodium: 136 mmol/L (ref 135–145)

## 2020-06-14 LAB — CBC WITH DIFFERENTIAL/PLATELET
Abs Immature Granulocytes: 0.04 10*3/uL (ref 0.00–0.07)
Basophils Absolute: 0.1 10*3/uL (ref 0.0–0.1)
Basophils Relative: 1 %
Eosinophils Absolute: 0 10*3/uL (ref 0.0–0.5)
Eosinophils Relative: 1 %
HCT: 32.4 % — ABNORMAL LOW (ref 39.0–52.0)
Hemoglobin: 11.9 g/dL — ABNORMAL LOW (ref 13.0–17.0)
Immature Granulocytes: 1 %
Lymphocytes Relative: 7 %
Lymphs Abs: 0.6 10*3/uL — ABNORMAL LOW (ref 0.7–4.0)
MCH: 26 pg (ref 26.0–34.0)
MCHC: 36.7 g/dL — ABNORMAL HIGH (ref 30.0–36.0)
MCV: 70.7 fL — ABNORMAL LOW (ref 80.0–100.0)
Monocytes Absolute: 0.5 10*3/uL (ref 0.1–1.0)
Monocytes Relative: 6 %
Neutro Abs: 6.9 10*3/uL (ref 1.7–7.7)
Neutrophils Relative %: 84 %
Platelets: 173 10*3/uL (ref 150–400)
RBC: 4.58 MIL/uL (ref 4.22–5.81)
RDW: 19.7 % — ABNORMAL HIGH (ref 11.5–15.5)
WBC: 8 10*3/uL (ref 4.0–10.5)
nRBC: 0.3 % — ABNORMAL HIGH (ref 0.0–0.2)

## 2020-06-14 NOTE — Progress Notes (Signed)
Palliative:  HPI: 79 y.o.malepresenting with weakness in the setting of vomiting and diarrhea.PMH is significant for metastatic carcinoid tumor, BPH s/p laser vaporization, and HTN. Palliative care was asked to get involved to aid in goals of care conversations in the setting of acute illness, renal failure, and declining health state. He is followed by OP Oncology - Dr. Benay Spice for metastatic carcinoid tumor.  I met today at [name]'s bedside with internal medicine as well. They told [name] and his son that we need a disposition plan by tomorrow morning. Son seems most interested in pursuing SNF but is worried that his father will not get the care he needs there. Unfortunately I believe this is a valid concern as he has severe symptom burden and high care needs and I think it is unlikely he would get the care he needs in a facility. However, I think it is reasonable to do a SNF search and see what facilities may be options for them. We also discussed potential to take him home. We did discuss home with hospice and FMLA and care needs at home. These seem to be the 2 best options that family are interested in pursuing. We discussed that we will begin SNF search and will have a secondary plan if facilities are unacceptable to family. We plan to meet tomorrow 06/15/20 0900 am to finalize these plans with family together.   All questions/concerns addressed to the best of my ability. Emotional support provided. Recognize that this is very overwhelming to family.   Exam: Lethargic. Extremely weak. No distress. Evidence of phlegm/vomit on gown and bed.   Plan: - Family meeting tomorrow 9/24 0900 am to finalize disposition plans.   [place], NP Palliative Medicine Team Pager [phone] (Please see [email or web address] for schedule) Team Phone [phone]    Greater than 50%  of this time was spent counseling and coordinating care related to the above assessment and plan

## 2020-06-14 NOTE — Progress Notes (Signed)
Physical Therapy Treatment Patient Details Name: Charles Schmidt MRN: [id number] DOB: Feb 23, 1941 Today's Date: 06/14/2020    History of Present Illness Charles Schmidt is a 79 y.o. male presenting with weakness in the setting of vomiting and diarrhea. PMH is significant for metastatic carcinoid tumor, BPH s/p laser vaporization, and HTN.  On precautions for Cdiff.    PT Comments    Pt progressed during session to perform with assist +1; pt continues to be motivated to participate to sit EOB; pt will benefit from additional skilled PT to address weakness and endurance to progress pt to OOB activities to maximize independence with functional mobility prior to discharge.     Follow Up Recommendations  Supervision/Assistance - 24 hour;SNF     Equipment Recommendations  Hospital bed    Recommendations for Other Services       Precautions / Restrictions Precautions Precautions: Fall Restrictions Weight Bearing Restrictions: No    Mobility  Bed Mobility Overal bed mobility: Needs Assistance Bed Mobility: Supine to Sit;Rolling Rolling: Min guard (use of bedrails)   Supine to sit: Mod assist     General bed mobility comments: pt performed scooting EOB multiple times to reposition with S from therapist  Transfers                    Ambulation/Gait                 Stairs             Wheelchair Mobility    Modified Rankin (Stroke Patients Only)       Balance Overall balance assessment: Needs assistance Sitting-balance support: No upper extremity supported;Feet supported Sitting balance-Leahy Scale: Good                                      Cognition Arousal/Alertness: Awake/alert Behavior During Therapy: WFL for tasks assessed/performed Overall Cognitive Status: Within Functional Limits for tasks assessed                                        Exercises      General Comments General comments (skin  integrity, edema, etc.): 1 MD's present during session      Pertinent Vitals/Pain Pain Assessment: No/denies pain    Home Living                      Prior Function            PT Goals (current goals can now be found in the care plan section) Acute Rehab PT Goals Patient Stated Goal: to go home PT Goal Formulation: With patient Time For Goal Achievement: 06/22/20 Potential to Achieve Goals: Good Progress towards PT goals: Progressing toward goals    Frequency    Min 3X/week      PT Plan Current plan remains appropriate    Schmidt-evaluation              AM-PAC PT "6 Clicks" Mobility   Outcome Measure  Help needed turning from your back to your side while in a flat bed without using bedrails?: A Little Help needed moving from lying on your back to sitting on the side of a flat bed without using bedrails?: A Little Help needed moving to and from a bed to a chair (including  a wheelchair)?: A Little Help needed standing up from a chair using your arms (e.g., wheelchair or bedside chair)?: A Lot Help needed to walk in hospital room?: Total Help needed climbing 3-5 steps with a railing? : Total 6 Click Score: 13    End of Session   Activity Tolerance: Patient limited by fatigue Patient left: with family/visitor present;with call bell/phone within reach;in bed Nurse Communication: Mobility status PT Visit Diagnosis: Muscle weakness (generalized) (M62.81);Unsteadiness on feet (R26.81)     Time: 1761-6073 PT Time Calculation (min) (ACUTE ONLY): 30 min  Charges:  $Therapeutic Activity: 23-37 mins                     Charles Schmidt, DPT Acute Rehabilitation Services [phone]   Charles Schmidt 06/14/2020, 10:12 AM

## 2020-06-14 NOTE — Progress Notes (Signed)
[facility] is unable to offer a room today. Hospital Liaison will follow up tomorrow or sooner if a room becomes available.    Please do not hesitate to call with any questions.     Thank you,    Freddie Breech, RN  [facility] Liaison (listed on AMION under [place])     [phone]

## 2020-06-14 NOTE — Progress Notes (Addendum)
IP PROGRESS NOTE   Subjective:  No complaint this morning.  No nausea or pain.  Diarrhea has improved.  Objective: Vital signs in last 24 hours: Blood pressure (!) 144/95, pulse 97, temperature (!) 97.5 F (36.4 C), temperature source Oral, resp. rate 18, height 6' 4" (1.93 m), weight 107.8 kg, SpO2 98 %.  Intake/Output from previous day: 09/22 0701 - 09/23 0700 In: 69 [P.O.:460] Out: 700 [Urine:700]  Physical Exam: HEENT: Mild thrush at the buccal mucosa Abdomen: Marked hepatomegaly extending into the left abdomen Extremities: Trace leg edema Neurologic: Alert, follows commands  Lab Results: Recent Labs    06/12/20 1146 06/14/20 0959  WBC 6.6 8.0  HGB 11.6* 11.9*  HCT 32.3* 32.4*  PLT 112* 173    BMET Recent Labs    06/13/20 0606 06/14/20 0959  NA 136 136  K 3.9 3.6  CL 106 106  CO2 14* 15*  GLUCOSE 110* 126*  BUN 80* 81*  CREATININE 6.30* 5.98*  CALCIUM 7.8* 7.7*    No results found for: CEA1  Studies/Results: No results found. CT images reviewed  Medications: I have reviewed the patient's current medications.  Assessment/Plan: 1.Metastatic carcinoid tumor, biopsy of a liver lesion 10/23/2017 consistent with a well-differentiated neuroendocrine neoplasm, WHO grade 2,ki-6710%  CT abdomen/pelvis 10/13/2017-extensive liver metastases, cirrhosis, soft tissue mass in the small bowel mesentery versus a primary small bowel tumor, tiny lucent lesions in the pelvic bones  Elevated chromogranin A and 24-hour urine 5-HIAA  CT chest 11/24/2017-subpleural nodule in the left lower lobe with associated radiotracer activity on comparison DOTATATEPET scan. No additional evidence of thoracic metastasis.  DOTATATE PET scan3/01/2018-intense radiotracer accumulation with innumerable confluent hepatic metastasis; intense radiotracer activity within central mesenteric mass; 2 foci of uptake associated with the small bowel; intense radiotracer activity associated periaortic  and paraspinal lymph nodes; more distant solitary small metastasis within the pleural space left lower lobe.  Monthly Sandostatin initiated 11/26/2017, dose increased to 30 mg 01/21/2018  Cycle 1 Lutathera 03/03/2018, monthly Sandostatin continued  Cycle 2 Lutathera 04/27/2018, monthly Sandostatin continued  Cycle 3 Lutathera 06/23/2018  Cycle 4 Lutathera 08/18/2018  Restaging dotatate PET scan 09/29/2018-multiple lesions again demonstrated within the liver which accumulate the radiotracer. The number of lesions which accumulate the tracer has decreased visually compared to the prior exam. Some lesions are no longer evident along the right hepatic margin. Remaining measurable lesions have radiotracer activity similar to prior. No new lesions present. Central mesenteric mass unchanged and remains avid for radiotracer. Adjacent lesion within the small bowel SUV max equal 10.5 compared to SUV max equal 15.8. Smaller lesion previously identified in the upper pelvis small bowel appears decreased in activity. No new metastatic lesions. Lesion position between the right psoas muscle and spine SUV max equal to 21.8 compared with SUV max equal 26.2.  Dotatate PET 10/19/2019-no evidence of disease progression, stable multifocal hepatic metastases, unchanged from post therapy scan 03/30/2019 and improved from pretherapy scan5/01/2018. Stable mesenteric mass, improved retroperitoneal lymph node adjacent to the celiac trunk, solitary small bowel lesion unchanged  Monthly Sandostatin continued  01/26/2020 chromogranin A level stable elevation  CT abdomen/pelvis 06/05/2020-slight enlargement of central mesenteric mass, extensive hepatic metastases-unchanged, bowel wall thickening in central small bowel loops and right colon  2.Diarrhea-likely secondary to carcinoid syndrome, improved with Sandostatin 3.History of anorexia/weight loss-improved 4.Prostatic hypertrophy;status post laser vaporization  01/05/2018 5. Mild pancytopenia following Lutathera treatment 6.Thoracic dermatomal zoster rash 04/02/2019 treated with Valtrex 7.  Admission 06/05/2020 after a fall with failure to  thrive, increased diarrhea, nausea, and abdominal pain 8.  Markedly elevated AST and CPK-likely rhabdomyolysis 9.  Acute renal failure   Mr. Hornig is alert and appears to have an improved performance status.  Diarrhea has improved with the increased dose of octreotide.  His renal function has stabilized with increased urine output.  He has been agreeable to participate with physical therapy.  Hopefully his renal function will continue to improve.  Family is trying to determine best plan for discharge.  Recommendations: 1.  Continue octreotide 150 mg every 8 hours, resume long-acting Sandostatin or lanreotide at discharge.  Discussed with inpatient pharmacy and we cannot give lanreotide as an inpatient.  We will continue octreotide every 8 hours for now. 2.  Continue physical therapy 3.  Skilled nursing facility placement versus home with 24-hour care.   LOS: 9 days   [name], NP   06/14/2020, 2:16 PM Mr. Knickerbocker was interviewed and examined.  Multiple family members were at the bedside.  Diarrhea has improved with the increased dose of octreotide.  His renal function appears to be improving.  His family understands the underlying metastatic carcinoid tumor dictates the overall poor prognosis.  We plan to change to a different long-acting somatostatin analog, lanreotide, when he is discharged from the hospital.  He may be a candidate for repeat treatment with Lutathera if his performance status improves.  They understand this therapy will not be curative.  I suspect his survival will be measured in months even if the renal function improves. I will recommend hospice care if he does not become ambulatory and capable of self-care following this hospital admission.  We discussed disposition plans including  home with family, skilled nursing facility placement, and [facility].  He does not appear to be a candidate for [facility] at present.

## 2020-06-14 NOTE — Progress Notes (Signed)
Family Medicine Teaching Service Daily Progress Note Intern Pager: [phone]  Patient name: Charles Schmidt Medical record number: [id number] Date of birth: 09-20-1941 Age: 79 y.o. Gender: male  Primary Care Provider: Nolene Ebbs, MD Consultants: Oncology, Palliative, Urology, Nephrology Code Status: DNR   Pt Overview and Major Events to Date:  9/14: Admitted for weakness, vomiting, diarrhea. Oncology consulted 9/15: Extreme elevation in AST, GI and surgery consulted, CTA abd/pelvis obtained 9/16: Developed urinary retention and AKI, foley placed, urology and nephrology consulted 9/17: Worsening kidney function and hyperphosphatemia  Assessment and Plan: Charles Woodsonis a 79 y.o.malepresenting with weakness in the setting of vomiting and diarrhea, subsequently found to have rhabdomyolysis.PMH is significant for metastatic carcinoid tumor, BPH s/p laser vaporization, and HTN.   ARF  Hyperphosphatemia Labs this morning showed creatinine of 5.98, GFR 8, BUN 81.  Phosphorus has been consistently elevated, yesterday measured 9.1.  Patient has 0.3 mL/kg/hr of urine output over the last 24 hours.  Nephrology was previously consulted and is no longer following, refer to prior notes. -Foley in place -Strict I's/O's -p.o. as tolerated -Monitor kidney function with CMP's -Continue Renvela 800 mg 3 times daily -Continue p.o. bicarb per nephro  Weakness Patient was admitted with weakness, previously able to do ADLs and is no longer able to at this time.  Patient is still having weakness, but is motivated to participate. -Continue PT/OT  Metastatic Carcinoid Tumor  Patient with hx of carcinoid tumor with mesenteric mass and innumerable hepatic metastasis. Had been receiving monthly sandostatin prior to admission. -Oncology following, appreciate recommendations -Palliative consulted for [place] discussion, appreciate recommendations  Diarrhea, improving Patient reports no diarrhea in the  last 24 hours.  Likely cause is secondary to metastatic carcinoid tumor. -Continue octreotide 150 mg per oncology Dr. Benay Schmidt  Goals of Care Patient with metastatic carcinoid tumor and worsening kidney function. Failure to thrive currently with weakness, no urine output, and limited PO intake. Will continue to follow up.  Disposition decision needs to be made time tomorrow; SNF versus hospice versus 24/7 home supervision -Palliative care following, disposition decision needs to be made by tomorrow. -We will speak to family today about need for decision for disposition  Hiccups Patient continues to have hiccups.  Might be related to uremia versus cancer.  Can consider 1 mg Haldol for treatment. -Continue Zanaflex at 2 mg every 8 as needed for hiccups  Rhabdomyolysis, resolved Patient had a reported fall on day of admission, subsequent Charles in CK and AST.  Labs down trended with fluids.    FEN/GI: Renal diet PPx: Renally adjusted Lovenox  Status is: Inpatient Remains inpatient appropriate because:Persistent severe electrolyte disturbances, Unsafe d/c plan and Inpatient level of care appropriate due to severity of illness. [place] discussion ongoing  Dispo: The patient is from: Home  Anticipated d/c is to: SNF vs Hospice   Anticipated d/c date is: 1 day  Patient currently is medically stable to d/c.  Disposition: pending improvement vs GOC discussion to Hospice  Subjective:  Patient has no complaints today.  His son is by bedside and reports that the patient has been spitting up a little bit of phlegm.  Denies that he is coughing, but he is clearing his throat and has quite phlegm which he did not have previously.  Per patient, did not have any diarrhea yesterday and has been trying to eat.  Patient and family are in discussions with Dr. Benay Schmidt in palliative care for the next steps in care and goals of care.  Objective:  Temp:  [97.8 F (36.6  C)-98.4 F (36.9 C)] 98.4 F (36.9 C) (09/23 0822) Pulse Rate:  [79-93] 93 (09/23 0822) Resp:  [16-19] 16 (09/23 0822) BP: (93-136)/(68-95) 136/83 (09/23 0822) SpO2:  [96 %-99 %] 99 % (09/23 0822) Weight:  [107.8 kg] 107.8 kg (09/23 0326) Physical Exam: General: Thin elderly male, some at bedside, NAD, hiccuping CV: RRR without m/r/g, 2+ radial pulses, 1+ pitting edema in b/l LE Resp: Comfortable on room air, speaking in full sentences, no accessory muscle use Abd: Firm, mildly distended, nontender Neuro: Awake and alert, conversive  Laboratory: Recent Labs  Lab 06/12/20 1146  WBC 6.6  HGB 11.6*  HCT 32.3*  PLT 112*   Recent Labs  Lab 06/07/20 1143 06/07/20 1143 06/07/20 1859 06/07/20 1859 06/08/20 0259 06/09/20 0551 06/11/20 0457 06/12/20 1146 06/13/20 0606  NA 145   < > 142   < > 142   < > 137 138 136  K 4.2   < > 4.2   < > 3.9   < > 3.6 4.1 3.9  CL 115*   < > 113*   < > 112*   < > 108 109 106  CO2 14*   < > 13*   < > 15*   < > 14* 13* 14*  BUN 27*   < > 34*   < > 37*   < > 68* 78* 80*  CREATININE 2.53*   < > 3.01*   < > 3.44*   < > 6.14* 6.51* 6.30*  CALCIUM 7.1*   < > 7.1*   < > 7.0*   < > 7.5* 7.7* 7.8*  PROT 5.5*  --  5.6*  --  5.7*  --   --   --   --   BILITOT 1.4*  --  1.5*  --  1.5*  --   --   --   --   ALKPHOS 289*  --  287*  --  273*  --   --   --   --   ALT 81*  --  85*  --  84*  --   --   --   --   AST  --   --  339*  --  259*  --   --   --   --   GLUCOSE 64*   < > 86   < > 92   < > 91 114* 110*   < > = values in this interval not displayed.     Imaging/Diagnostic Tests: No results found.   Charles Patience, DO 06/14/2020, 9:23 AM PGY-1, [place] Intern pager: [phone], text pages welcome

## 2020-06-15 MED ORDER — HALOPERIDOL 1 MG PO TABS
1.0000 mg | ORAL_TABLET | Freq: Once | ORAL | Status: AC
  Administered 2020-06-15: 1 mg via ORAL
  Filled 2020-06-15: qty 1

## 2020-06-15 MED ORDER — SEVELAMER CARBONATE 0.8 G PO PACK
0.8000 g | PACK | Freq: Three times a day (TID) | ORAL | Status: DC
  Administered 2020-06-15 – 2020-06-18 (×7): 0.8 g via ORAL
  Filled 2020-06-15 (×11): qty 1

## 2020-06-15 NOTE — Progress Notes (Signed)
Family Medicine Teaching Service Daily Progress Note Intern Pager: [phone]  Patient name: Charles Schmidt Medical record number: [id number] Date of birth: Mar 08, 1941 Age: 79 y.o. Gender: male  Primary Care Provider: Nolene Ebbs, MD Consultants: Oncology, Palliative, Urology, Nephrology Code Status: DNR   Pt Overview and Major Events to Date:  9/14: Admitted for weakness, vomiting, diarrhea. Oncology consulted 9/15: Extreme elevation in AST, GI and surgery consulted, CTA abd/pelvis obtained 9/16: Developed urinary retention and AKI, foley placed, urology and nephrology consulted 9/17: Worsening kidney function and hyperphosphatemia 9/24: Family meeting with palliative-decision for SNF placement  Assessment and Plan: Charles Schmidt a 79 y.o.malepresenting with weakness in the setting of vomiting and diarrhea, subsequently found to have rhabdomyolysis.PMH is significant for metastatic carcinoid tumor, BPH s/p laser vaporization, and HTN.   ARF  Hyperphosphatemia, improving Labs would not be collected every other day.  Patient has had an increase in urine output to 0.6 mL/kg/hr of urine output over the last 24 hours.  Nephrology was previously consulted and is no longer following, refer to prior notes. -Foley in place -Strict I's/O's -p.o. as tolerated -Monitor kidney function with CMP's every other day -Continue Renvela 800 mg 3 times daily -Continue p.o. bicarb per nephro  Weakness, improving Patient was admitted with weakness, previously able to do ADLs and is no longer able to at this time.  Patient is still having weakness, but is motivated to participate. -Continue PT/OT  Metastatic Carcinoid Tumor , Stable Patient with hx of carcinoid tumor with mesenteric mass and innumerable hepatic metastasis. Had been receiving monthly sandostatin prior to admission. -Oncology following, appreciate recommendations -Palliative consulted for [place] discussion, appreciate  recommendations  Diarrhea, improving Patient reports no diarrhea in the last 24 hours. -Continue octreotide 150 mg per oncology Dr. Benay Schmidt  Goals of Care Patient with metastatic carcinoid tumor and worsening kidney function. Failure to thrive currently with weakness, no urine output, and limited PO intake.  Current disposition per family will be SNF. -Palliative care following, disposition SNF -SW to reach out with SNF placement options per bed availability  Hiccups Patient continues to have hiccups.  Might be related to uremia versus cancer.  Family reports Zanaflex makes him too sleepy during the day -Stopping Zanaflex -Trial with 1 mg Haldol, will continue to monitor for improvement  Rhabdomyolysis, resolved   FEN/GI: Renal diet PPx: Renally adjusted Lovenox  Status is: Inpatient Remains inpatient appropriate because:Persistent severe electrolyte disturbances, Unsafe d/c plan and Inpatient level of care appropriate due to severity of illness. [place] discussion ongoing  Dispo: The patient is from: Home  Anticipated d/c is to: SNF vs Hospice   Anticipated d/c date is: 1 day  Patient currently is medically stable to d/c.  Disposition: pending improvement vs GOC discussion to Hospice  Subjective:  Had family meeting today with palliative care and our family medicine team present.  Family has decided that the patient will be suitable for SNF placement, they are not in the position to support him at home.  They do not feel that he is quite ready for hospice.  Their main concerns are that he gets well fed, hydrated, and cleaned because of the diarrhea.  Patient reports that he is not afraid to die, and is not afraid of hospice if that becomes a need.  Patient's main complaint today is the hiccups as it worsens his energy.  Objective: Temp:  [97.5 F (36.4 C)-99.7 F (37.6 C)] 99.7 F (37.6 C) (09/24 0402) Pulse Rate:  [88-100] 88 (09/24  0400) Resp:  [11-18] 11 (09/24 0400) BP: (136-158)/(83-102) 147/96 (09/24 0400) SpO2:  [91 %-99 %] 93 % (09/24 0400) Weight:  [105.7 kg] 105.7 kg (09/24 0300) Physical Exam: General: Thin elderly male, initially sleeping on exam but aroused during conversation with intractable hiccups CV: RRR, 2+ pitting in B/L LE Resp: Comfortable on room air, able to speak in full sentences Neuro: Initially asleep, awoke during meeting and was conversational and alert.  Laboratory: Recent Labs  Lab 06/12/20 1146 06/14/20 0959  WBC 6.6 8.0  HGB 11.6* 11.9*  HCT 32.3* 32.4*  PLT 112* 173   Recent Labs  Lab 06/12/20 1146 06/13/20 0606 06/14/20 0959  NA 138 136 136  K 4.1 3.9 3.6  CL 109 106 106  CO2 13* 14* 15*  BUN 78* 80* 81*  CREATININE 6.51* 6.30* 5.98*  CALCIUM 7.7* 7.8* 7.7*  GLUCOSE 114* 110* 126*     Imaging/Diagnostic Tests: No results found.   Charles Patience, DO 06/15/2020, 6:27 AM PGY-1, [place] Intern pager: [phone], text pages welcome

## 2020-06-15 NOTE — Progress Notes (Addendum)
Palliative:  HPI:79 y.o.malepresenting with weakness in the setting of vomiting and diarrhea.PMH is significant for metastatic carcinoid tumor, BPH s/p laser vaporization, and HTN. Palliative care was asked to get involved to aid in goals of care conversations in the setting of acute illness, renal failure,and declining health state. He is followed by OP Oncology - Dr. Benay Spice for metastatic carcinoid tumor.  I met today at [name]'s bedside with his family along with internal medicine providers. We discussed how to move forward from here and discussed in detail the difference in goals and care at [facility], home with hospice, and hospice facility. Family want to pursue SNF at this time. We did discuss transition from SNF to hospice facility if he declines. They are torn between wanting him to have the best care possible and wanting to pursue ongoing measures to prolong life (even though options are very limited). In the end, they are not mentally prepared for hospice yet and want to give rehab a try. I do worry about poor intake and ability to sustain for very long and how this can impact his ability to rehab and voiced this today. Family understand.   Discussed symptom management today as well. Primary team to add haldol to try and better manage hiccups that have become debilitating to him. Mr. Baehr also awakens and was able to tell his family today that he is not afraid to die and I think this was important for him to express and for them to hear. He does agree for rehab trial but seems open to hospice options as well.   All questions/concerns addressed. Emotional support provided. Discussed with CSW who will discuss further with family. I also provided family with copy of Hard Choices booklet.   Exam: Lethargic. Frail. Weakness and fatigue. No distress. Breathing regular, unlabored. Intractable hiccups. Abd soft. BLE pitting edema.   Plan: - SNF rehab with palliative to follow.  - Primary  team making adjustments for symptom management.   Buckman, NP Palliative Medicine Team Pager [phone] (Please see [email or web address] for schedule) Team Phone [phone]    Greater than 50%  of this time was spent counseling and coordinating care related to the above assessment and plan

## 2020-06-15 NOTE — Progress Notes (Addendum)
IP PROGRESS NOTE   Subjective:  No complaints today.  Diarrhea much improved.  No nausea or vomiting.  Urine output continues to increase.  No labs drawn this morning.  Objective: Vital signs in last 24 hours: Blood pressure 128/81, pulse 87, temperature 98.1 F (36.7 C), temperature source Oral, resp. rate 11, height 6' 4"  (1.93 m), weight 233 lb (105.7 kg), SpO2 96 %.  Intake/Output from previous day: 09/23 0701 - 09/24 0700 In: 660 [P.O.:660] Out: 1401 [Urine:1400; Stool:1]  Physical Exam: Abdomen: Marked hepatomegaly extending into the left abdomen Extremities: Trace leg edema Neurologic: Alert, follows commands  Lab Results: Recent Labs    06/14/20 0959  WBC 8.0  HGB 11.9*  HCT 32.4*  PLT 173    BMET Recent Labs    06/13/20 0606 06/14/20 0959  NA 136 136  K 3.9 3.6  CL 106 106  CO2 14* 15*  GLUCOSE 110* 126*  BUN 80* 81*  CREATININE 6.30* 5.98*  CALCIUM 7.8* 7.7*    No results found for: CEA1  Studies/Results: No results found. CT images reviewed  Medications: I have reviewed the patient's current medications.  Assessment/Plan: 1.Metastatic carcinoid tumor, biopsy of a liver lesion 10/23/2017 consistent with a well-differentiated neuroendocrine neoplasm, WHO grade 2,[id number]%  CT abdomen/pelvis 10/13/2017-extensive liver metastases, cirrhosis, soft tissue mass in the small bowel mesentery versus a primary small bowel tumor, tiny lucent lesions in the pelvic bones  Elevated chromogranin A and 24-hour urine 5-HIAA  CT chest 11/24/2017-subpleural nodule in the left lower lobe with associated radiotracer activity on comparison DOTATATEPET scan. No additional evidence of thoracic metastasis.  DOTATATE PET scan3/01/2018-intense radiotracer accumulation with innumerable confluent hepatic metastasis; intense radiotracer activity within central mesenteric mass; 2 foci of uptake associated with the small bowel; intense radiotracer activity associated periaortic  and paraspinal lymph nodes; more distant solitary small metastasis within the pleural space left lower lobe.  Monthly Sandostatin initiated 11/26/2017, dose increased to 30 mg 01/21/2018  Cycle 1 Lutathera 03/03/2018, monthly Sandostatin continued  Cycle 2 Lutathera 04/27/2018, monthly Sandostatin continued  Cycle 3 Lutathera 06/23/2018  Cycle 4 Lutathera 08/18/2018  Restaging dotatate PET scan 09/29/2018-multiple lesions again demonstrated within the liver which accumulate the radiotracer. The number of lesions which accumulate the tracer has decreased visually compared to the prior exam. Some lesions are no longer evident along the right hepatic margin. Remaining measurable lesions have radiotracer activity similar to prior. No new lesions present. Central mesenteric mass unchanged and remains avid for radiotracer. Adjacent lesion within the small bowel SUV max equal 10.5 compared to SUV max equal 15.8. Smaller lesion previously identified in the upper pelvis small bowel appears decreased in activity. No new metastatic lesions. Lesion position between the right psoas muscle and spine SUV max equal to 21.8 compared with SUV max equal 26.2.  Dotatate PET 10/19/2019-no evidence of disease progression, stable multifocal hepatic metastases, unchanged from post therapy scan 03/30/2019 and improved from pretherapy scan5/01/2018. Stable mesenteric mass, improved retroperitoneal lymph node adjacent to the celiac trunk, solitary small bowel lesion unchanged  Monthly Sandostatin continued  01/26/2020 chromogranin A level stable elevation  CT abdomen/pelvis 06/05/2020-slight enlargement of central mesenteric mass, extensive hepatic metastases-unchanged, bowel wall thickening in central small bowel loops and right colon  2.Diarrhea-likely secondary to carcinoid syndrome, improved with Sandostatin 3.History of anorexia/weight loss-improved 4.Prostatic hypertrophy;status post laser vaporization  01/05/2018 5. Mild pancytopenia following Lutathera treatment 6.Thoracic dermatomal zoster rash 04/02/2019 treated with Valtrex 7.  Admission 06/05/2020 after a fall with failure to thrive,  increased diarrhea, nausea, and abdominal pain 8.  Markedly elevated AST and CPK-likely rhabdomyolysis 9.  Acute renal failure   Mr. Lacuesta appears stable.  Urine output continues to increase.  Renal function not checked on lab work this morning. Diarrhea has improved with octreotide.  He has been agreeable to participate with physical therapy.  Family is trying to determine best plan for discharge.  Recommendations: 1.  Continue octreotide 150 mg every 8 hours, resume long-acting Sandostatin or lanreotide at discharge.  2.  Continue physical therapy 3.  Skilled nursing facility placement versus home with 24-hour care. 4.  With increase in urine output, may need to consider IV fluids. 5.  He continues to have hiccups and Zanaflex was ineffective.  Haldol has been ordered per primary team.  Recommend trial of baclofen if Haldol ineffective.  Please call medical oncology over the weekend for questions.  We will follow up on Monday if he remains in the hospital.  Will arrange for outpatient follow-up once plan for discharge finalized.   LOS: 10 days   Betsy Coder, MD   06/15/2020, 2:55 PM Mr. Feild was interviewed and examined.  Diarrhea remains improved.  He will continue octreotide with the plan to initiate lanreotide at discharge. He said today that he feels he has resigned himself to skilled nursing facility placement.  Renal function appears to be improving.  He appears to be developing a post ATN diuresis.  He may require IV fluids.  Mr. Gavigan has a poor prognosis for long-term survival, but he is currently not a candidate for [employer].  Please call oncology as needed over the weekend.  I will check on him 06/18/2020 if he remains in the hospital.  Outpatient follow-up will be scheduled  at the Cancer center.

## 2020-06-15 NOTE — Care Management Important Message (Signed)
Important Message  Patient Details  Name: Denton Derks MRN: [id number] Date of Birth: [birth year]   Medicare Important Message Given:  Yes     Shelda Altes 06/15/2020, 10:58 AM

## 2020-06-15 NOTE — TOC Initial Note (Addendum)
Transition of Care [facility]) - Initial/Assessment Note    Patient Details  Name: Charles Schmidt MRN: [id number] Date of Birth: 1941/05/14  Transition of Care [facility]) CM/SW Contact:    Loreta Ave, [place] Phone Number: 06/15/2020, 2:52 PM  Clinical Narrative:                 CSW received consult for possible SNF placement at time of discharge. CSW spoke with patient and patient's family regarding PT recommendation of SNF placement at time of discharge. Patient's family reported that they are currently unable to care for patient at their home given patient's current physical needs and fall risk. Patient's family expressed understanding of PT recommendation and is agreeable to SNF placement at time of discharge. Family expresses preference for [facility] in [place]. CSW discussed insurance authorization process and provided Medicare SNF ratings list. Patient has received the COVID vaccines. Patient and family expressed being hopeful for rehab and to feel better soon. No further questions reported at this time. CSW to continue to follow and assist with discharge planning needs.        Patient Goals and CMS Choice        Expected Discharge Plan and Services                                                Prior Living Arrangements/Services                       Activities of Daily Living Home Assistive Devices/Equipment: Cane (specify quad or straight), Walker (specify type) ADL Screening (condition at time of admission) Patient's cognitive ability adequate to safely complete daily activities?: Yes Is the patient deaf or have difficulty hearing?: No Does the patient have difficulty seeing, even when wearing glasses/contacts?: No Does the patient have difficulty concentrating, remembering, or making decisions?: Yes Patient able to express need for assistance with ADLs?: Yes Does the patient have difficulty dressing or bathing?: Yes Independently performs ADLs?:  No Communication: Needs assistance Is this a change from baseline?: Pre-admission baseline Dressing (OT): Needs assistance Is this a change from baseline?: Pre-admission baseline Grooming: Needs assistance Is this a change from baseline?: Pre-admission baseline Feeding: Needs assistance Is this a change from baseline?: Pre-admission baseline Bathing: Needs assistance Is this a change from baseline?: Pre-admission baseline Toileting: Needs assistance Is this a change from baseline?: Pre-admission baseline In/Out Bed: Needs assistance Is this a change from baseline?: Pre-admission baseline Walks in Home: Needs assistance Is this a change from baseline?: Pre-admission baseline Does the patient have difficulty walking or climbing stairs?: Yes Weakness of Legs: Both Weakness of Arms/Hands: None  Permission Sought/Granted                  Emotional Assessment              Admission diagnosis:  Colitis [K52.9] Weakness [R53.1] Elevated liver enzymes [R74.8] Nausea and vomiting in adult [R11.2] Patient Active Problem List   Diagnosis Date Noted  . Pressure injury of skin 06/12/2020  . Oliguria   . Palliative care by specialist   . Goals of care, counseling/discussion   . DNR (do not resuscitate)   . Acute renal failure ([place])   . Abdominal pain   . Elevated troponin   . Lives alone 06/06/2020  . Nonspecific elevation of levels of transaminase  or lactic acid dehydrogenase (LDH) 06/06/2020  . Increased ammonia level 06/06/2020  . Direct hyperbilirubinemia 06/06/2020  . Bowel wall thickening 06/06/2020  . Vomiting, persistent, in adult 06/06/2020  . Anorexia 06/06/2020  . Colitis   . Elevated liver enzymes   . Nausea and vomiting in adult   . Dehydration   . Physical debility 06/05/2020  . Genetic testing 12/18/2017  . Metastatic malignant neuroendocrine tumor to liver ([place]) 10/28/2017  . Essential hypertension (primary) 05/04/2017  . Hypokalemia 05/04/2017  .  Benign prostatic hyperplasia 05/04/2017  . Glaucoma 05/04/2017  . Benign hypertensive heart disease without heart failure 05/04/2017  . Microcytic anemia 05/04/2017  . Prediabetes 05/04/2017  . Hypocalcemia 05/04/2017  . Psoriasis 05/04/2017   PCP:  Nolene Ebbs, MD Pharmacy:   [facility] [address] [place] [address] Phone: [phone] Fax: [facility], [place] [address] Phone: [phone] Fax: [phone]     Social Determinants of Health (SDOH) Interventions    Readmission Risk Interventions No flowsheet data found.

## 2020-06-15 NOTE — Progress Notes (Signed)
[facility])  Per MD notes, pt is no longer appropriate for residential hospice.    Appears that he will go to SNF.  [facility] will follow him with our palliative department at facility.   Venia Carbon RN, BSN, CCRN

## 2020-06-15 NOTE — Plan of Care (Signed)
  Problem: Pain Managment: Goal: General experience of comfort will improve Outcome: Completed/Met

## 2020-06-16 LAB — RENAL FUNCTION PANEL
Albumin: 1.7 g/dL — ABNORMAL LOW (ref 3.5–5.0)
Anion gap: 14 (ref 5–15)
BUN: 77 mg/dL — ABNORMAL HIGH (ref 8–23)
CO2: 17 mmol/L — ABNORMAL LOW (ref 22–32)
Calcium: 7.7 mg/dL — ABNORMAL LOW (ref 8.9–10.3)
Chloride: 110 mmol/L (ref 98–111)
Creatinine, Ser: 4.8 mg/dL — ABNORMAL HIGH (ref 0.61–1.24)
GFR calc Af Amer: 12 mL/min — ABNORMAL LOW (ref >60)
GFR calc non Af Amer: 11 mL/min — ABNORMAL LOW (ref >60)
Glucose, Bld: 104 mg/dL — ABNORMAL HIGH (ref 70–99)
Phosphorus: 6 mg/dL — ABNORMAL HIGH (ref 2.5–4.6)
Potassium: 3.6 mmol/L (ref 3.5–5.1)
Sodium: 141 mmol/L (ref 135–145)

## 2020-06-16 MED ORDER — BACLOFEN 10 MG PO TABS
10.0000 mg | ORAL_TABLET | Freq: Once | ORAL | Status: AC
  Administered 2020-06-16: 10 mg via ORAL
  Filled 2020-06-16 (×2): qty 1

## 2020-06-16 MED ORDER — SODIUM CHLORIDE 0.9 % IV SOLN: INTRAVENOUS | Status: AC

## 2020-06-16 MED ORDER — HALOPERIDOL 0.5 MG PO TABS
0.5000 mg | ORAL_TABLET | Freq: Once | ORAL | Status: AC
  Administered 2020-06-16: 0.5 mg via ORAL
  Filled 2020-06-16: qty 1

## 2020-06-16 NOTE — Progress Notes (Signed)
Patient's daughter concern about 0.5mg  haldol given at 1030, patient states that haldol made him feel drunk. Paged and notified MD, MD is aware. Order baclofen for hiccups.

## 2020-06-16 NOTE — Progress Notes (Signed)
Patient's daughter concern about spasms/tremors. Did not witness any tremors, patient's daughter is also concern about patient's shivering due to the [facility] in the room. There are no blankets on the patient, recommended to put the sheer blankets on top of the patient so he isn't feeling stuffy but he is not shivering. Patient's oral temperature is 98.5.

## 2020-06-16 NOTE — Progress Notes (Signed)
Family Medicine Teaching Service Daily Progress Note Intern Pager: [phone]  Patient name: Charles Schmidt Medical record number: [id number] Date of birth: 1941-03-22 Age: 79 y.o. Gender: male  Primary Care Provider: Nolene Ebbs, MD Consultants: Oncology, Palliative, Urology, Nephrology Code Status: DNR   Pt Overview and Major Events to Date:  9/14: Admitted for weakness, vomiting, diarrhea. Oncology consulted 9/15: Extreme elevation in AST, GI and surgery consulted, CTA abd/pelvis obtained 9/16: Developed urinary retention and AKI, foley placed, urology and nephrology consulted 9/17: Worsening kidney function and hyperphosphatemia 9/24: Family meeting with palliative-decision for SNF placement  Assessment and Plan: Charles Schmidt a 79 y.o.malepresenting with weakness in the setting of vomiting and diarrhea, subsequently found to have rhabdomyolysis.PMH is significant for metastatic carcinoid tumor, BPH s/p laser vaporization, and HTN.  ARF  Hyperphosphatemia, improving UOP improving to 0.7 mL/kg/hr in the last 24 hours, Cr improved from 5.98 to 4.8, phosphorus decreased from 9 to 6. Most likely patient is developing post-ATN diuresis. Will start maintenance IVF today, will reassess if still needed tomorrow, to prevent intravascular depletion. Bicarb still low, but improved, at 17. We will continue Renvela and bicarb today, but will reassess on a daily basis. Nephrology was previously consulted and is no longer following, refer to prior notes. ATN due to rhabdomyolysis from earlier in admission, now resolved. - NS @ 179mL/hr -Foley in place -Strict I's/O's -p.o. as tolerated - AM renal function panel -Continue Renvela 800 mg 3 times daily -Continue p.o. bicarb per nephro  Goals of Care Patient with metastatic carcinoid tumor and improving from ARF. Failure to thrive currently with weakness and limited PO intake.  Current disposition per family will be SNF. -Palliative care  following, disposition SNF -SW to reach out with SNF placement options per bed availability  Hiccups Patient continues to have hiccups.  Might be related to uremia versus cancer.  Family reports Zanaflex makes him too sleepy during the day. Trial of haldol successful so far, can consider baclofen if haldol ultimately does not work. -Stopping Zanaflex -Trial with 1 mg Haldol, will continue to monitor for improvement  Weakness, improving Patient was admitted with weakness, previously able to do ADLs and is no longer able to at this time.  Patient is still having weakness, but is motivated to participate. -Continue PT/OT  Metastatic Carcinoid Tumor , Stable Patient with hx of carcinoid tumor with mesenteric mass and innumerable hepatic metastasis. Had been receiving monthly sandostatin prior to admission. -Oncology following, appreciate recommendations  Diarrhea, improving Patient reports no diarrhea in the last 24 hours. -Continue octreotide 150 mg per oncology Dr. Benay Spice  FEN/GI: Renal diet PPx: Renally adjusted Lovenox  Status is: Inpatient Remains inpatient appropriate because:Persistent severe electrolyte disturbances, Unsafe d/c plan and Inpatient level of care appropriate due to severity of illness. GOC discussion ongoing, awaiting SNF placement  Dispo: The patient is from: Home  Anticipated d/c is to: SNF  Anticipated d/c date is: 1 day  Patient currently is medically stable to d/c.  Disposition: pending improvement vs GOC discussion to Hospice  Subjective:  Patient feeling much better overall, still with intractable hiccups. Tried 1mg  haldol yesterday, which family reported made him too sleepy. He was ok with this as hiccups stopped. Agreeable to trying 0.5 mg haldol to see if this stops hiccups and does not make him as sleepy today.  Objective: Temp:  [98.1 F (36.7 C)-98.7 F (37.1 C)] 98.1 F (36.7 C) (09/25 0700) Pulse Rate:   [87-105] 105 (09/25 0700) Resp:  [13-17] 16 (  09/25 0700) BP: (128-151)/(81-100) 151/97 (09/25 0700) SpO2:  [96 %-97 %] 97 % (09/25 0700) Weight:  [104.8 kg] 104.8 kg (09/25 0043) Physical Exam: General: Thin elderly male, initially sleeping on exam but aroused during conversation with intractable hiccups CV: RRR, 2+ pitting in B/L LE Resp: Comfortable on room air, able to speak in full sentences Neuro: Initially asleep, awoke during meeting and was conversational and alert.  Laboratory: Recent Labs  Lab 06/12/20 1146 06/14/20 0959  WBC 6.6 8.0  HGB 11.6* 11.9*  HCT 32.3* 32.4*  PLT 112* 173   Recent Labs  Lab 06/13/20 0606 06/14/20 0959 06/16/20 0531  NA 136 136 141  K 3.9 3.6 3.6  CL 106 106 110  CO2 14* 15* 17*  BUN 80* 81* 77*  CREATININE 6.30* 5.98* 4.80*  CALCIUM 7.8* 7.7* 7.7*  GLUCOSE 110* 126* 104*     Imaging/Diagnostic Tests: No results found.   Gladys Damme, MD 06/16/2020, 8:05 AM PGY-2, [place] Intern pager: [phone], text pages welcome

## 2020-06-16 NOTE — Progress Notes (Signed)
OT Cancellation Note  Patient Details Name: Charles Schmidt MRN: [id number] DOB: January 10, 1941   Cancelled Treatment:    Reason Eval/Treat Not Completed: Other (comment) First attempt in AM patient asking to come back at noon. Returned ~1300 patient reports feeling drunk after receiving haldol earlier today "I can't do nothin I feel drunk." Will re-attempt as schedule allows.  Delbert Phenix OT OT pager: [phone]   Rosemary Holms 06/16/2020, 1:07 PM

## 2020-06-17 LAB — RENAL FUNCTION PANEL
Albumin: 1.7 g/dL — ABNORMAL LOW (ref 3.5–5.0)
Anion gap: 10 (ref 5–15)
BUN: 68 mg/dL — ABNORMAL HIGH (ref 8–23)
CO2: 18 mmol/L — ABNORMAL LOW (ref 22–32)
Calcium: 7.8 mg/dL — ABNORMAL LOW (ref 8.9–10.3)
Chloride: 111 mmol/L (ref 98–111)
Creatinine, Ser: 3.84 mg/dL — ABNORMAL HIGH (ref 0.61–1.24)
GFR calc Af Amer: 16 mL/min — ABNORMAL LOW (ref >60)
GFR calc non Af Amer: 14 mL/min — ABNORMAL LOW (ref >60)
Glucose, Bld: 132 mg/dL — ABNORMAL HIGH (ref 70–99)
Phosphorus: 5 mg/dL — ABNORMAL HIGH (ref 2.5–4.6)
Potassium: 3.8 mmol/L (ref 3.5–5.1)
Sodium: 139 mmol/L (ref 135–145)

## 2020-06-17 MED ORDER — BACLOFEN 10 MG PO TABS
10.0000 mg | ORAL_TABLET | Freq: Once | ORAL | Status: AC
  Administered 2020-06-17: 10 mg via ORAL
  Filled 2020-06-17: qty 1

## 2020-06-17 NOTE — Progress Notes (Signed)
Family Medicine Teaching Service Daily Progress Note Intern Pager: [phone]  Patient name: Charles Schmidt Medical record number: [id number] Date of birth: [birth year] Age: 79 y.o. Gender: male  Primary Care Provider: Nolene Ebbs, MD Consultants: Oncology, Palliative, Urology, Nephrology Code Status: DNR   Pt Overview and Major Events to Date:  9/14: Admitted for weakness, vomiting, diarrhea. Oncology consulted 9/15: Extreme elevation in AST, GI and surgery consulted, CTA abd/pelvis obtained 9/16: Developed urinary retention and AKI, foley placed, urology and nephrology consulted 9/17: Worsening kidney function and hyperphosphatemia 9/24: Family meeting with palliative-decision for SNF placement  Assessment and Plan: Charles Schmidt a 79 y.o.malepresenting with weakness in the setting of vomiting and diarrhea, subsequently found to have rhabdomyolysis.PMH is significant for metastatic carcinoid tumor, BPH s/p laser vaporization, and HTN.   ARF  Hyperphosphatemia, improving UOP improving to 0.8 mL/kg/hr in the last 24 hours.  Labs have been collected every other day, yesterday creatinine was improved to 4.8, and phosphorus to 6, with GFR of 11.  Patient received NS IVF at 100 mL/h overnight. Continue to reassess need for Renvela and bicarb daily. -Foley in place -Strict I's/O's -p.o. as tolerated -Monitor kidney function with CMP's every other day -Continue Renvela 800 mg 3 times daily -Continue p.o. bicarb per nephro -Compression stockings for third spacing fluid  Hiccups Patient continues to have hiccups.  Might be related to uremia versus cancer.  Family reports Zanaflex and Haldol were too sedating.  Last night patient was given baclofen with improvement. -Continue baclofen -Consider metoclopramide if not improving.  HTN Patient has had several days of elevated blood pressures ranging from 140s-150s/90-100s. -Continue to monitor  Goals of Care Patient with metastatic  carcinoid tumor and worsening kidney function. Failure to thrive currently with weakness, no urine output, and limited PO intake.  Current disposition per family will be SNF. -Palliative care following, disposition SNF -SW to reach out with SNF placement options per bed availability  Weakness, improving Patient was admitted with weakness, previously able to do ADLs and is no longer able to at this time.  Patient is still having weakness, but is motivated to participate. -Continue PT/OT  Metastatic Carcinoid Tumor , Stable Patient with hx of carcinoid tumor with mesenteric mass and innumerable hepatic metastasis. Had been receiving monthly sandostatin prior to admission. -Oncology following, appreciate recommendations -Palliative consulted for [place] discussion, appreciate recommendations  Diarrhea, improving Patient reports he is only having one episode of diarrhea per day. -Continue octreotide 150 mg per oncology Dr. Benay Spice   FEN/GI: Renal diet PPx: Renally adjusted Lovenox  Status is: Inpatient Remains inpatient appropriate because:Persistent severe electrolyte disturbances, Unsafe d/c plan and Inpatient level of care appropriate due to severity of illness. [place] discussion ongoing  Dispo: The patient is from: Home  Anticipated d/c is to: SNF vs Hospice   Anticipated d/c date is: 1 day  Patient currently is medically stable to d/c.  Disposition: SNF  Subjective:  This morning patient reports he is doing well, still hiccuping in the room right now but states that earlier in the morning he was not. Patient has no complaints of abdominal pain. Patient states that the Haldol "made me feel drunk"  Objective: Temp:  [98.2 F (36.8 C)-99.1 F (37.3 C)] 98.9 F (37.2 C) (09/26 0758) Pulse Rate:  [96-107] 96 (09/26 0800) Resp:  [16-20] 20 (09/26 0758) BP: (139-156)/(96-104) 156/104 (09/26 0800) SpO2:  [94 %-97 %] 96 % (09/26 0800) Weight:  [101.2  kg] 101.2 kg (09/26 0032) Physical Exam: General: Developed  elderly male with intractable hiccups, laying in bed with washrag around mouth. Son at bedside CV: RRR, 2+ pitting in bilateral lower extremities Resp: Comfortable on room air, CTAB Neuro: Conversational and alert, baseline mildly slurred speech  Laboratory: Recent Labs  Lab 06/12/20 1146 06/14/20 0959  WBC 6.6 8.0  HGB 11.6* 11.9*  HCT 32.3* 32.4*  PLT 112* 173   Recent Labs  Lab 06/13/20 0606 06/14/20 0959 06/16/20 0531  NA 136 136 141  K 3.9 3.6 3.6  CL 106 106 110  CO2 14* 15* 17*  BUN 80* 81* 77*  CREATININE 6.30* 5.98* 4.80*  CALCIUM 7.8* 7.7* 7.7*  GLUCOSE 110* 126* 104*     Imaging/Diagnostic Tests: No results found.   Rise Patience, DO 06/17/2020, 10:02 AM PGY-1, [place] Intern pager: [phone], text pages welcome

## 2020-06-18 LAB — RESPIRATORY PANEL BY RT PCR (FLU A&B, COVID)
Influenza A by PCR: NEGATIVE
Influenza B by PCR: NEGATIVE
SARS Coronavirus 2 by RT PCR: NEGATIVE

## 2020-06-18 MED ORDER — ORAL CARE MOUTH RINSE
15.0000 mL | Freq: Two times a day (BID) | OROMUCOSAL | Status: DC
  Administered 2020-06-19 – 2020-07-05 (×23): 15 mL via OROMUCOSAL

## 2020-06-18 NOTE — Progress Notes (Signed)
OT Cancellation Note  Patient Details Name: Charles Schmidt MRN: [id number] DOB: 01-Mar-1941   Cancelled Treatment:    Reason Eval/Treat Not Completed: Fatigue/lethargy limiting ability to participate;Patient declined, no reason specified;Pain limiting ability to participate Pt asleep upon arrival with family in the room. He states that PT just left but the patient was unable to participate due to increased pain. Pt unable to open eyes and respond for himself consistently. Will continue to follow acutely as time permits.   Corinne Ports E. Chayne Baumgart, COTA/L Acute Rehabilitation Services [phone] [place] 06/18/2020, 11:31 AM

## 2020-06-18 NOTE — Progress Notes (Addendum)
IP PROGRESS NOTE   Subjective:  Diarrhea resolved. Having good urine output.   Objective: Vital signs in last 24 hours: Blood pressure (!) 149/90, pulse (!) 103, temperature 98.3 F (36.8 C), temperature source Oral, resp. rate 16, height 6' 4"  (1.93 m), weight 103.9 kg, SpO2 98 %.  Intake/Output from previous day: 09/26 0701 - 09/27 0700 In: 1200 [P.O.:1200] Out: 1301 [Urine:1300; Stool:1]  Physical Exam: Abdomen: Marked hepatomegaly extending into the left abdomen Extremities: Trace leg edema Neurologic: Alert, follows commands  Lab Results: No results for input(s): WBC, HGB, HCT, PLT in the last 72 hours.  BMET Recent Labs    06/16/20 0531 06/17/20 1305  NA 141 139  K 3.6 3.8  CL 110 111  CO2 17* 18*  GLUCOSE 104* 132*  BUN 77* 68*  CREATININE 4.80* 3.84*  CALCIUM 7.7* 7.8*    No results found for: CEA1  Studies/Results: No results found. CT images reviewed  Medications: I have reviewed the patient's current medications.  Assessment/Plan: 1.Metastatic carcinoid tumor, biopsy of a liver lesion 10/23/2017 consistent with a well-differentiated neuroendocrine neoplasm, WHO grade 2,[id number]%  CT abdomen/pelvis 10/13/2017-extensive liver metastases, cirrhosis, soft tissue mass in the small bowel mesentery versus a primary small bowel tumor, tiny lucent lesions in the pelvic bones  Elevated chromogranin A and 24-hour urine 5-HIAA  CT chest 11/24/2017-subpleural nodule in the left lower lobe with associated radiotracer activity on comparison DOTATATEPET scan. No additional evidence of thoracic metastasis.  DOTATATE PET scan3/01/2018-intense radiotracer accumulation with innumerable confluent hepatic metastasis; intense radiotracer activity within central mesenteric mass; 2 foci of uptake associated with the small bowel; intense radiotracer activity associated periaortic and paraspinal lymph nodes; more distant solitary small metastasis within the pleural space left  lower lobe.  Monthly Sandostatin initiated 11/26/2017, dose increased to 30 mg 01/21/2018  Cycle 1 Lutathera 03/03/2018, monthly Sandostatin continued  Cycle 2 Lutathera 04/27/2018, monthly Sandostatin continued  Cycle 3 Lutathera 06/23/2018  Cycle 4 Lutathera 08/18/2018  Restaging dotatate PET scan 09/29/2018-multiple lesions again demonstrated within the liver which accumulate the radiotracer. The number of lesions which accumulate the tracer has decreased visually compared to the prior exam. Some lesions are no longer evident along the right hepatic margin. Remaining measurable lesions have radiotracer activity similar to prior. No new lesions present. Central mesenteric mass unchanged and remains avid for radiotracer. Adjacent lesion within the small bowel SUV max equal 10.5 compared to SUV max equal 15.8. Smaller lesion previously identified in the upper pelvis small bowel appears decreased in activity. No new metastatic lesions. Lesion position between the right psoas muscle and spine SUV max equal to 21.8 compared with SUV max equal 26.2.  Dotatate PET 10/19/2019-no evidence of disease progression, stable multifocal hepatic metastases, unchanged from post therapy scan 03/30/2019 and improved from pretherapy scan5/01/2018. Stable mesenteric mass, improved retroperitoneal lymph node adjacent to the celiac trunk, solitary small bowel lesion unchanged  Monthly Sandostatin continued  01/26/2020 chromogranin A level stable elevation  CT abdomen/pelvis 06/05/2020-slight enlargement of central mesenteric mass, extensive hepatic metastases-unchanged, bowel wall thickening in central small bowel loops and right colon  2.Diarrhea-likely secondary to carcinoid syndrome, improved with Sandostatin 3.History of anorexia/weight loss-improved 4.Prostatic hypertrophy;status post laser vaporization 01/05/2018 5. Mild pancytopenia following Lutathera treatment 6.Thoracic dermatomal zoster rash  04/02/2019 treated with Valtrex 7.  Admission 06/05/2020 after a fall with failure to thrive, increased diarrhea, nausea, and abdominal pain 8.  Markedly elevated AST and CPK on admission -likely rhabdomyolysis 9.  Acute renal failure-improved   Mr.  Schmidt appears stable.  Urine output continues to increase.  Renal function not checked on lab work this morning, but creatinine down to 3.84 yesterday. Diarrhea has has now resolved. He remains on octreotide. Awaiting discharge to SNF.  Recommendations: 1.  Continue octreotide 150 mg every 8 hours, resume long-acting Sandostatin or lanreotide at discharge.  2.  Await SNF placement.  Will arrange for outpatient follow-up once he is discharged. Please call Oncology as needed for questions.    LOS: 13 days   Charles Bussing, NP   06/18/2020, 2:41 PM Charles Schmidt appears stable.  Renal function has improved.  Diarrhea is controlled with octreotide.  He is waiting for skilled nursing facility placement.  We will schedule lanreotide treatment at the Cancer center for within 1 week of hospital discharge.  Please call oncology as needed.  Charles Manson, MD

## 2020-06-18 NOTE — Progress Notes (Signed)
Family Medicine Teaching Service Daily Progress Note Intern Pager: [phone]  Patient name: Charles Schmidt Medical record number: [id number] Date of birth: Jan 02, 1941 Age: 79 y.o. Gender: male  Primary Care Provider: Nolene Ebbs, MD Consultants: Oncology, Palliative, Urology, Nephrology Code Status: DNR   Pt Overview and Major Events to Date:  9/14: Admitted for weakness, vomiting, diarrhea. Oncology consulted 9/15: Extreme elevation in AST, GI and surgery consulted, CTA abd/pelvis obtained 9/16: Developed urinary retention and AKI, foley placed, urology and nephrology consulted 9/17: Worsening kidney function and hyperphosphatemia 9/24: Family meeting with palliative-decision for SNF placement  Assessment and Plan: Charles Schmidt a 79 y.o.malepresenting with weakness in the setting of vomiting and diarrhea, subsequently found to have rhabdomyolysis.PMH is significant for metastatic carcinoid tumor, BPH s/p laser vaporization, and HTN.   ARF  Hyperphosphatemia, improving UOP decreased to 0.74mL/kg/hr in the last 24 hours.  Labs have been collected every other day, yesterday Cr improved from 4.8> 3.84, phosphorus now 5, with GFR of 14.  -Foley in place -Strict I's/O's -p.o. as tolerated -Monitor kidney function with CMP's every other day -Consider discontinuing bicarb and Renvela due to improved kidney function and decreased phos -Continue compression stockings  Hiccups, improved Patient reports no hiccups today.  Patient was given another dose of baclofen last night with improvement.  Can consider changing to metoclopramide if patient hiccups resume -Continue baclofen  HTN Patient has had several days of elevated blood pressures ranging from 143s-150s/90-100s. -Continue to monitor  Goals of Care Patient with metastatic carcinoid tumor and worsening kidney function. Failure to thrive currently with weakness, no urine output, and limited PO intake.  Current disposition per  family will be SNF. -Palliative care following, disposition SNF  Weakness, improving Patient was admitted with weakness, previously able to do ADLs and is no longer able to at this time.  Patient is still having weakness, but is motivated to participate. -Continue PT/OT  Metastatic Carcinoid Tumor , Stable Patient with hx of carcinoid tumor with mesenteric mass and innumerable hepatic metastasis. Had been receiving monthly sandostatin prior to admission. -Oncology following, appreciate recommendations -Palliative consulted for [place] discussion, appreciate recommendations  Diarrhea, improving Patient reports only one diarrheal episode on average per day. -Continue octreotide 150 mg per oncology Dr. Benay Spice   FEN/GI: Renal diet PPx: Renally adjusted Lovenox  Status is: Inpatient Remains inpatient appropriate because: Awaiting disposition to SNF for safe discharge.  Dispo: The patient is from: Home  Anticipated d/c is to: SNF vs Hospice   Anticipated d/c date is: 1 day  Patient currently is medically stable to d/c.  Disposition: awaiting SNF  Subjective:  This morning patient reports that he is doing better, he states that the baclofen has helped stop the hiccups.  Son-in-law at bedside stated that the patient has make-up since he has been there this morning.  Patient reports that he still feels weak.  Son-in-law reports that the family is in the process of interviewing SNFs.  Objective: Temp:  [97.8 F (36.6 C)-98.9 F (37.2 C)] 98.4 F (36.9 C) (09/26 2000) Pulse Rate:  [96-102] 98 (09/26 1700) Resp:  [19-20] 19 (09/26 1140) BP: (138-156)/(96-104) 149/96 (09/26 1700) SpO2:  [95 %-96 %] 96 % (09/26 1700) Weight:  [103.9 kg] 103.9 kg (09/27 0122) Physical Exam: General: Elderly male, lying in bed, eating breakfast CV: RRR, 1+ pitting in bilateral LE (R>L), compression stockings on Resp: Comfortable on room air, CTAB Neuro:  Conversational and alert, baseline mildly slurred speech  Laboratory: Recent Labs  Lab 06/12/20  1146 06/14/20 0959  WBC 6.6 8.0  HGB 11.6* 11.9*  HCT 32.3* 32.4*  PLT 112* 173   Recent Labs  Lab 06/14/20 0959 06/16/20 0531 06/17/20 1305  NA 136 141 139  K 3.6 3.6 3.8  CL 106 110 111  CO2 15* 17* 18*  BUN 81* 77* 68*  CREATININE 5.98* 4.80* 3.84*  CALCIUM 7.7* 7.7* 7.8*  GLUCOSE 126* 104* 132*     Imaging/Diagnostic Tests: No results found.   Rise Patience, DO 06/18/2020, 5:51 AM PGY-1, Standard Intern pager: [phone], text pages welcome

## 2020-06-18 NOTE — Progress Notes (Signed)
Physical Therapy Treatment Patient Details Name: Charles Schmidt MRN: [id number] DOB: 09/27/1940 Today's Date: 06/18/2020    History of Present Illness Charles Schmidt is a 79 y.o. male presenting with weakness in the setting of vomiting and diarrhea. PMH is significant for metastatic carcinoid tumor, BPH s/p laser vaporization, and HTN.  On precautions for Cdiff.    PT Comments    Pt limited during session by pain; pt unable to participate in session due to pain; PT attempted to reposition to improve pain and provided education regarding positioning to family to assist; family and pt have decided SNF for rehab, pt's frequency changed to 2 x per week; Pt continues to demonstrate deficits in strength, endurance and coordination and will benefit from skilled PT to address deficits to maximize independence with functional mobility prior to discharge.     Follow Up Recommendations  Supervision/Assistance - 24 hour;SNF     Equipment Recommendations       Recommendations for Other Services       Precautions / Restrictions Restrictions Weight Bearing Restrictions: No    Mobility  Bed Mobility               General bed mobility comments: attempted rolling R to reposition and provide pillows due to increased back pain; pt attempted x 3 trials and due to pain unable to complete  Transfers                    Ambulation/Gait                 Stairs             Wheelchair Mobility    Modified Rankin (Stroke Patients Only)       Balance                                            Cognition                                              Exercises      General Comments General comments (skin integrity, edema, etc.): PT provided education to family regarding repositioning to improve pain; grandson receptive      Pertinent Vitals/Pain Pain Assessment: 0-10 Pain Score: 10-Worst pain ever Pain Location: L low  back Pain Intervention(s): Limited activity within patient's tolerance;Repositioned (attempted to reposition with pt unable to tolerate; therapist repositioned bed with support under knees, pt states that helped a little)    Home Living                      Prior Function            PT Goals (current goals can now be found in the care plan section) Acute Rehab PT Goals Patient Stated Goal: to go home PT Goal Formulation: With patient/family Time For Goal Achievement: 06/22/20 Potential to Achieve Goals: Fair Progress towards PT goals: Not progressing toward goals - comment (no progress during session due to pain)    Frequency    Min 2X/week      PT Plan Frequency needs to be updated; frequency updated from 3x/week to 2x/week  ;   Co-evaluation  AM-PAC PT "6 Clicks" Mobility   Outcome Measure  Help needed turning from your back to your side while in a flat bed without using bedrails?: A Little (pt limited in all mobility due to pain and resistant to movement) Help needed moving from lying on your back to sitting on the side of a flat bed without using bedrails?: A Little Help needed moving to and from a bed to a chair (including a wheelchair)?: A Little Help needed standing up from a chair using your arms (e.g., wheelchair or bedside chair)?: A Lot Help needed to walk in hospital room?: Total Help needed climbing 3-5 steps with a railing? : Total 6 Click Score: 13    End of Session   Activity Tolerance: Patient limited by pain Patient left: with family/visitor present;with call bell/phone within reach Nurse Communication: Mobility status PT Visit Diagnosis: Muscle weakness (generalized) (M62.81);Unsteadiness on feet (R26.81)     Time: 1771-1657 PT Time Calculation (min) (ACUTE ONLY): 9 min  Charges:  $Therapeutic Activity: 8-22 mins                     Lyanne Co, DPT Acute Rehabilitation Services [phone]   [name] 06/18/2020, 11:20 AM

## 2020-06-18 NOTE — Progress Notes (Signed)
FPTS Interim Progress Note  S: Paged by RN that patient was complaining of left flank pain and was requesting as needed pain medication. In the room, the patient was initially dozing off. He was tired on exam an would respond to most questions. Patient stated he had pain in his lateral left side that son-in-law stated started less than 30 minutes ago.   O: BP (!) 149/90 (BP Location: Left Arm)   Pulse (!) 103   Temp 98.5 F (36.9 C) (Oral)   Resp 16   Ht 6\' 4"  (1.93 m)   Wt 103.9 kg   SpO2 98%   BMI 27.87 kg/m    General: NAD, resting in room, responds sometimes with words and sometimes with grunts. Patient asked to be left alone to rest during exam. Back: several EKG lead wires and other wires underneath patient's left side with indention of wires in skin. Patient sensitive to light touch at sites of indention.  Abdomen: firm, mildly distended, bowel sounds present, non-tender   A/P: Patient is a 79 year old male who has complaints of left flank pain.  Patient physical exam remarkable for indentions from wires underneath patient's body, and sensitivity to light touch in those areas.  Son-in-law at bedside states patient has not had the pain for the last 30 minutes, patient was dozing off in her room, asked "let me rest" during the physical exam and responded to most questions. Patient currently has a Foley, afebrile.  Currently have a low suspicion for infection, but if worsening or continued consider changing Foley and getting a U/A, and CBC.  At this time, appears more likely that the pain was attributable to the wires under the patient's body.  Son-in-law instructed to report patient continues to complain of this pain, nursing repeated vitals and patient was afebrile, will check back in in the afternoon to ensure resolution if not contacted sooner.   Rise Patience, D.O.  PGY-1  Family Medicine  [phone] 06/18/2020 9:32 AM

## 2020-06-18 NOTE — Progress Notes (Signed)
FPTS Interim Progress Note  S: Spoke with Education officer, museum about SNF bed placement.  She stated that the patient has a bed available at [facility] in [place], which was the family's preference on last discussion.  Son-in-law was in the room, stated that the patient's children are all aware, and in discussion.  O: BP (!) 136/97   Pulse 95   Temp 98.3 F (36.8 C) (Oral)   Resp 15   Ht 6\' 4"  (1.93 m)   Wt 103.9 kg   SpO2 99%   BMI 27.87 kg/m    General: NAD, sleeping in bed, pillow placed under left back  A/P: Wil Slape is a 79 year old male awaiting SNF placement.  Discussed with son-in-law in the room the importance of a speedy decision being made about placement.  Patient currently has availability with the bed at [facility] in [place], which was the family preference at the moment.  Son-in-law stated that the family/children are aware and are in conversation.  He says that his wife should be here this afternoon around 4 or 5 PM, hopefully at which time a decision will have been made.  We will recheck when patient's daughter comes to visit about the decision, and will be in contact with social work.  Rise Patience, D.O.  PGY-1  Family Medicine  [phone] 06/18/2020 3:33 PM

## 2020-06-19 ENCOUNTER — Inpatient Hospital Stay: Payer: Medicare Other | Attending: Oncology

## 2020-06-19 ENCOUNTER — Inpatient Hospital Stay: Payer: Medicare Other | Admitting: Nurse Practitioner

## 2020-06-19 ENCOUNTER — Inpatient Hospital Stay: Payer: Medicare Other

## 2020-06-19 LAB — RENAL FUNCTION PANEL
Albumin: 1.6 g/dL — ABNORMAL LOW (ref 3.5–5.0)
Anion gap: 12 (ref 5–15)
BUN: 51 mg/dL — ABNORMAL HIGH (ref 8–23)
CO2: 20 mmol/L — ABNORMAL LOW (ref 22–32)
Calcium: 7.9 mg/dL — ABNORMAL LOW (ref 8.9–10.3)
Chloride: 109 mmol/L (ref 98–111)
Creatinine, Ser: 3.15 mg/dL — ABNORMAL HIGH (ref 0.61–1.24)
GFR calc Af Amer: 21 mL/min — ABNORMAL LOW (ref >60)
GFR calc non Af Amer: 18 mL/min — ABNORMAL LOW (ref >60)
Glucose, Bld: 118 mg/dL — ABNORMAL HIGH (ref 70–99)
Phosphorus: 3.7 mg/dL (ref 2.5–4.6)
Potassium: 3.5 mmol/L (ref 3.5–5.1)
Sodium: 141 mmol/L (ref 135–145)

## 2020-06-19 MED ORDER — BACLOFEN 10 MG PO TABS: 10.0000 mg | ORAL_TABLET | Freq: Once | ORAL | Status: DC

## 2020-06-19 MED ORDER — AMLODIPINE BESYLATE 5 MG PO TABS
5.0000 mg | ORAL_TABLET | Freq: Every day | ORAL | Status: DC
  Administered 2020-06-19 – 2020-06-24 (×6): 5 mg via ORAL
  Filled 2020-06-19 (×6): qty 1

## 2020-06-19 MED ORDER — BACLOFEN 10 MG PO TABS
10.0000 mg | ORAL_TABLET | Freq: Once | ORAL | Status: AC
  Administered 2020-06-19: 10 mg via ORAL
  Filled 2020-06-19: qty 1

## 2020-06-19 NOTE — Plan of Care (Signed)
  Problem: Activity: Goal: Risk for activity intolerance will decrease Outcome: Progressing   Problem: Nutrition: Goal: Adequate nutrition will be maintained Outcome: Progressing   Problem: Safety: Goal: Ability to remain free from injury will improve Outcome: Progressing   Problem: Skin Integrity: Goal: Risk for impaired skin integrity will decrease Outcome: Progressing

## 2020-06-19 NOTE — Progress Notes (Signed)
Patient had the hiccups earlier and they stopped, they have now come back. Patient is uncomfortable. MD paged about hiccups and elevated BP.

## 2020-06-19 NOTE — Progress Notes (Signed)
CSW went back to speak with [name], she stated that if [facility] is not an option, she will more than likely appeal the dc to buy more time. RNCM and doctors made aware.

## 2020-06-19 NOTE — Progress Notes (Signed)
CSW met with pt and daughter, [name] at bedside. CSW went over offers with [name] who stated that none of the current SNF offers would work for various reasons. Tonya then requested CSW look into the following: [facility], [facility], and [facility]. CSW explained that [facility] is not taking new patients and [facility] requires a Primary school teacher". CSW stated she would look into the option of [facility]. After the discussion, [name] stated she wanted to do more research, CSW advised that the current offers were the only ones that were an option to the patient unless they decided to take the patient home. CSW faxed the documentation to [facility]. CSW tried to reach out to the admissions rep at [facility] but had to leave a vm. CSW will continue to follow.

## 2020-06-19 NOTE — Progress Notes (Signed)
Family Medicine Teaching Service Daily Progress Note Intern Pager: [phone]  Patient name: Charles Schmidt Medical record number: [id number] Date of birth: 08-22-1941 Age: 79 y.o. Gender: male  Primary Care Provider: Nolene Ebbs, MD Consultants: Oncology, Palliative, Urology, Nephrology Code Status: DNR   Pt Overview and Major Events to Date:  9/14: Admitted for weakness, vomiting, diarrhea. Oncology consulted 9/15: Extreme elevation in AST, GI and surgery consulted, CTA abd/pelvis obtained 9/16: Developed urinary retention and AKI, foley placed, urology and nephrology consulted 9/17: Worsening kidney function and hyperphosphatemia 9/24: Family meeting with palliative-decision for SNF placement  Assessment and Plan: Charles Schmidt a 79 y.o.malepresenting with weakness in the setting of vomiting and diarrhea, subsequently found to have rhabdomyolysis.PMH is significant for metastatic carcinoid tumor, BPH s/p laser vaporization, and HTN.   ARF  Hyperphosphatemia, improving UOP 0.4 mL/kg/hr in the last 24 hours.  Labs have been collected every other day, today Cr improved from 3.84>3.15, phosphorus now 3.5, CO2 20, with GFR of 14. Renvela was discontinued yesterday. Continue to monitor need for bicarb with labs. -Foley in place -Strict I's/O's -p.o. as tolerated -Monitor kidney function with CMP's every other day --Continue bicarb with evaluation for continuation with labs -Continue compression stockings  Hiccups, improved Patient had minimal hiccups immediately after eating.  Patient was given another dose of baclofen last night with improvement.  Consider decreasing dose to 5mg  due to renal impairment with concern for build-up.  -Continue baclofen for symptom relief, will use 5mg  for next dose to see if effective  HTN Patient has had several days of elevated blood pressures ranging from 140s-150s/90-100s. -Continue to monitor --Adding Norvasc 5mg   Goals of Care Patient  with metastatic carcinoid tumor and worsening kidney function. Failure to thrive currently with weakness, no urine output, and limited PO intake.  Current disposition per family will be SNF. -Palliative care following, disposition SNF  Weakness, improving Patient was admitted with weakness, previously able to do ADLs and is no longer able to at this time.  Patient is still having weakness, but is motivated to participate. -Continue PT/OT  Metastatic Carcinoid Tumor , Stable Patient with hx of carcinoid tumor with mesenteric mass and innumerable hepatic metastasis. Had been receiving monthly sandostatin prior to admission. -Oncology following, appreciate recommendations -Palliative consulted for [place] discussion, appreciate recommendations  Diarrhea, improving Patient reports 2 episodes of diarrhea yesterday. -Continue octreotide 150 mg per oncology Dr. Benay Spice   FEN/GI: Renal diet PPx: Renally adjusted Lovenox  Status is: Inpatient Remains inpatient appropriate because: Awaiting disposition to SNF for safe discharge.  Dispo: The patient is from: Home  Anticipated d/c is to: SNF vs Hospice   Anticipated d/c date is: 1 day  Patient currently is medically stable to d/c.  Disposition: awaiting SNF  Subjective:  Patient states he is doing fine, denies abdominal pain, chest pain, leg pain. Daughter at bedside today, stated that he has not had a cough since she has been here.  She states that the family has 4 more SNFs that they would like to see about bed availability and want to talk with social work.  Objective: Temp:  [98.3 F (36.8 C)-99.7 F (37.6 C)] 98.8 F (37.1 C) (09/28 0445) Pulse Rate:  [95-105] 98 (09/28 0445) Resp:  [15-20] 18 (09/28 0445) BP: (136-159)/(90-115) 141/115 (09/28 0445) SpO2:  [95 %-99 %] 95 % (09/28 0445) Weight:  [103.4 kg] 103.4 kg (09/28 0500) Physical Exam: General: Elderly male, sitting up in bed eating  breakfast, daughter at bedside assisting CV: RRR,  trace LE edema (improved from yesterday), compression stockings on Resp: Comfortable on room air, CTAB Abd: distended, non-tender, hepatomegaly noted Neuro: Alert, sometimes responds inappropriately with the word "stop", A&Ox4  Laboratory: Recent Labs  Lab 06/12/20 1146 06/14/20 0959  WBC 6.6 8.0  HGB 11.6* 11.9*  HCT 32.3* 32.4*  PLT 112* 173   Recent Labs  Lab 06/16/20 0531 06/17/20 1305 06/19/20 0359  NA 141 139 141  K 3.6 3.8 3.5  CL 110 111 109  CO2 17* 18* 20*  BUN 77* 68* 51*  CREATININE 4.80* 3.84* 3.15*  CALCIUM 7.7* 7.8* 7.9*  GLUCOSE 104* 132* 118*     Imaging/Diagnostic Tests: No results found.   Rise Patience, DO 06/19/2020, 6:54 AM PGY-1, [place] Intern pager: [phone], text pages welcome

## 2020-06-19 NOTE — Progress Notes (Signed)
Nutrition Follow-up  RD working remotely.  DOCUMENTATION CODES:   Not applicable  INTERVENTION:   -Magic cup TID with meals, each supplement provides 290 kcal and 9 grams of protein  NUTRITION DIAGNOSIS:   Increased nutrient needs related to wound healing as evidenced by estimated needs.  Ongoing  GOAL:   Patient will meet greater than or equal to 90% of their needs  Progressing  MONITOR:   PO intake, Supplement acceptance, Labs, Weight trends, Skin, I & O's  REASON FOR ASSESSMENT:   Low Braden    ASSESSMENT:   79 year old male presenting with weakness in the setting of vomiting and diarrhea, subsequently found to have rhabdomyolysis and acute renal failure this hospitalization.  Reviewed I/O's: -1.5 L x 24 hours and +4.5 L since admission  UOP: 1.1 L x 24 hours  Per chart review, diarrhea has resolved with octreotide. Intake has been fair; noted meal completion 25-75%.   Reviewed wt hx; wt has been stable over the past year. However, pt with mild edema which may be masking weight loss as well as fat and muscle depletions.  Pt is no longer eligible for residential hospice. Plan to d/c to SNF with palliative services. Per MD notes, pt is medically stable for discharge. Pt family working with [facility] team for SNF placement.   Labs reviewed.   Diet Order:   Diet Order            Diet renal with fluid restriction Room service appropriate? Yes; Fluid consistency: Thin  Diet effective now                 EDUCATION NEEDS:   Not appropriate for education at this time  Skin:  Skin Assessment: Skin Integrity Issues: Skin Integrity Issues:: Stage II Stage II: scrotum, sacrum  Last BM:  06/17/20  Height:   Ht Readings from Last 1 Encounters:  06/07/20 6\' 4"  (1.93 m)    Weight:   Wt Readings from Last 1 Encounters:  06/19/20 103.4 kg    Ideal Body Weight:  91.8 kg  BMI:  Body mass index is 27.75 kg/m.  Estimated Nutritional Needs:   Kcal:   2100-2300  Protein:  90-105 grams  Fluid:  2 L    Loistine Chance, RD, LDN, [place] Registered Dietitian II Certified Diabetes Care and Education Specialist Please refer to [facility] for RD and/or RD on-call/weekend/after hours pager

## 2020-06-19 NOTE — Progress Notes (Signed)
Order to D/C tele. Telemetry removed

## 2020-06-19 NOTE — Progress Notes (Signed)
CSW went into the room and presented offer from [facility] to family member present, husband of [name] (daughter). He stated that he is not responsible for making any choices, and it would be best to reach out to [place] by phone at [phone]. CSW left the information for [facility] and tried to reach out to [name] via phone, no answer, had to leave a Advertising account executive. CSW called and spoke with [name], son of patient, advised that patient was ready for dc and [facility] could accept, [name] immediately stated that this SNF was out of the question because it is located in [place]. CSW then went over the other two facilities of [facility] and [facility]. CSW explained that the medicare.gov ratings list was left in the room and that since the patient was medically stable a decision would need to be made in reference to placement. [name] stated that he did not feel a decision could be made that quickly. CSW advised that while she understood this situation was not ideal, medically the hospital has cleared the patient and a decision would need to be made by tomorrow. [name] stated that he didn't have time to talk and that he was a Pharmacist, hospital and hung up the phone. CSW went back to the room and explained to the son-in-law of the dc process and that unfortunately, if the doctor has discharged the patient, insurance could deny payment and patient could be responsible for bill. Son-in-law stated he understood and would explain to the family and thanked CSW for her time. CSW spoke with RNCM D. Lovena Le who tried to reach back out to [place], but [place] did not answer. CSW will continue to follow.

## 2020-06-19 NOTE — Progress Notes (Deleted)
CSW went into the room and presented offer from [facility] to family member present, husband of [name] (daughter). He stated that he is not responsible for making any choices, and it would be best to reach out to [place] by phone at [phone]. CSW left the information for [facility] and tried to reach out to [name] via phone, no answer, had to leave a Advertising account executive. CSW called and spoke with [name], son of patient, advised that patient was ready for dc and [facility] could accept, [name] immediately stated that this SNF was out of the question because it is located in [place]. CSW then went over the other two facilities of [facility] and [facility]. CSW explained that the medicare.gov ratings list was left in the room and that since the patient was medically stable a decision would need to be made in reference to placement. [name] stated that he did not feel a decision could be made that quickly. CSW advised that while she understood this situation was not ideal, medically the hospital has cleared the patient and a decision would need to be made by tomorrow. [name] stated that he didn't have time to talk and that he was a Pharmacist, hospital and hung up the phone. CSW went back to the room and explained to the son-in-law of the dc process and that unfortunately, if the doctor has discharged the patient, insurance could deny payment and patient could be responsible for bill. Son-in-law stated he understood and would explain to the family and thanked CSW for her time. CSW spoke with RNCM D. Lovena Le who tried to reach back out to [place], but [place] did not answer. CSW will continue to follow.

## 2020-06-20 LAB — CBC WITH DIFFERENTIAL/PLATELET
Abs Immature Granulocytes: 0.03 10*3/uL (ref 0.00–0.07)
Basophils Absolute: 0 10*3/uL (ref 0.0–0.1)
Basophils Relative: 0 %
Eosinophils Absolute: 0 10*3/uL (ref 0.0–0.5)
Eosinophils Relative: 0 %
HCT: 33.1 % — ABNORMAL LOW (ref 39.0–52.0)
Hemoglobin: 11.2 g/dL — ABNORMAL LOW (ref 13.0–17.0)
Immature Granulocytes: 0 %
Lymphocytes Relative: 6 %
Lymphs Abs: 0.5 10*3/uL — ABNORMAL LOW (ref 0.7–4.0)
MCH: 25 pg — ABNORMAL LOW (ref 26.0–34.0)
MCHC: 33.8 g/dL (ref 30.0–36.0)
MCV: 73.9 fL — ABNORMAL LOW (ref 80.0–100.0)
Monocytes Absolute: 0.4 10*3/uL (ref 0.1–1.0)
Monocytes Relative: 5 %
Neutro Abs: 6.2 10*3/uL (ref 1.7–7.7)
Neutrophils Relative %: 89 %
Platelets: 171 10*3/uL (ref 150–400)
RBC: 4.48 MIL/uL (ref 4.22–5.81)
RDW: 20.7 % — ABNORMAL HIGH (ref 11.5–15.5)
WBC: 7.1 10*3/uL (ref 4.0–10.5)
nRBC: 0 % (ref 0.0–0.2)

## 2020-06-20 LAB — COMPREHENSIVE METABOLIC PANEL
ALT: 20 U/L (ref 0–44)
AST: 32 U/L (ref 15–41)
Albumin: 1.7 g/dL — ABNORMAL LOW (ref 3.5–5.0)
Alkaline Phosphatase: 253 U/L — ABNORMAL HIGH (ref 38–126)
Anion gap: 11 (ref 5–15)
BUN: 48 mg/dL — ABNORMAL HIGH (ref 8–23)
CO2: 19 mmol/L — ABNORMAL LOW (ref 22–32)
Calcium: 7.9 mg/dL — ABNORMAL LOW (ref 8.9–10.3)
Chloride: 112 mmol/L — ABNORMAL HIGH (ref 98–111)
Creatinine, Ser: 2.87 mg/dL — ABNORMAL HIGH (ref 0.61–1.24)
GFR calc Af Amer: 23 mL/min — ABNORMAL LOW (ref >60)
GFR calc non Af Amer: 20 mL/min — ABNORMAL LOW (ref >60)
Glucose, Bld: 145 mg/dL — ABNORMAL HIGH (ref 70–99)
Potassium: 3.4 mmol/L — ABNORMAL LOW (ref 3.5–5.1)
Sodium: 142 mmol/L (ref 135–145)
Total Bilirubin: 1.1 mg/dL (ref 0.3–1.2)
Total Protein: 5.8 g/dL — ABNORMAL LOW (ref 6.5–8.1)

## 2020-06-20 NOTE — Progress Notes (Signed)
Manufacturing engineer [facility]) liaison note  This RN exchanged report with colleague Trey Sailors, RN liaison with [facility], and visited pt and son [name] at bedside.  Introduced comfort care at end of life and the services provided at [employer].  [name] shared some reservations about [employer] (BP) and shared that he was still hoping for a "miracle."  [name] shared that the diarrhea was causing the most pain and distress for his dad; pt was noted by this RN to be grimacing and moaning off and on through out visit. This RN asked if pt was receiving any meds for comfort, particularly to address pain.  [name] reported that the family was not yet ready for comfort meds.  Contact infor for this RN provided.  This RN then received a call from the pt's daughter who asked about continued use of Sandostatin to manage diarrhea.  She also stated that the family wanted to get another assessment from Dr. Benay Spice before agreeing to BP. She reported plan to base goals of care on Dr. Gearldine Shown recommendation.  This RN will continue to follow.  Thank you for the opportunity to participate in this patient's care.  Domenic Moras, BSN, RN [employer] [phone] (24h on call)

## 2020-06-20 NOTE — Progress Notes (Signed)
Family Medicine Teaching Service Daily Progress Note Intern Pager: [phone]  Patient name: Charles Schmidt Medical record number: [id number] Date of birth: October 05, 1940 Age: 79 y.o. Gender: male  Primary Care Provider: Nolene Ebbs, MD Consultants: Oncology, Palliative, Urology, Nephrology Code Status: DNR   Pt Overview and Major Events to Date:  9/14: Admitted for weakness, vomiting, diarrhea. Oncology consulted 9/15: Extreme elevation in AST, GI and surgery consulted, CTA abd/pelvis obtained 9/16: Developed urinary retention and AKI, foley placed, urology and nephrology consulted 9/17: Worsening kidney function and hyperphosphatemia 9/24: Family meeting with palliative-decision for SNF placement  Assessment and Plan: Charles Schmidt a 79 y.o.malepresenting with weakness in the setting of vomiting and diarrhea, subsequently found to have rhabdomyolysis.PMH is significant for metastatic carcinoid tumor, BPH s/p laser vaporization, and HTN.   ARF  Hyperphosphatemia, improving UOP 0.6 mL/kg/hr in the last 24 hours.  Continue to monitor to determine continued need for bicarb. -Foley in place -Strict I's/O's -p.o. as tolerated -Monitor kidney function with CMP's every other day --Continue bicarb with evaluation for continuation with labs -Continue compression stockings -Ordering CMP and CBC today  Hiccups, improved Patient has no hiccups today, none reported overnight. -Baclofen 5 mg if continued.  HTN Patient BP has remained elevated for several days, still ranging from 140-160s/80-110s. -Continue to monitor --Continue Norvasc 5mg   Goals of Care Patient with metastatic carcinoid tumor and worsening kidney function. Current weakness with limited PO intake, family in talks with SW about disposition location. -Disposition SNF  Weakness, improving Patient was admitted with weakness, previously able to do ADLs and is no longer able to at this time.  Patient is still having  weakness, but is motivated to participate. -Continue PT/OT  Diarrhea, improving Daughter reports 4 episodes of diarrhea yesterday. -Continue octreotide 150 mg per oncology Dr. Benay Spice  Metastatic Carcinoid Tumor , Stable Patient with hx of carcinoid tumor with mesenteric mass and innumerable hepatic metastasis. Had been receiving monthly sandostatin prior to admission. -Oncology following, appreciate recommendations -Palliative consulted for [place] discussion, appreciate recommendations   FEN/GI: Renal diet PPx: Renally adjusted Lovenox  Status is: Inpatient Remains inpatient appropriate because: Awaiting disposition to SNF for safe discharge.  Dispo: The patient is from: Home  Anticipated d/c is to: SNF vs Hospice   Anticipated d/c date is: 1 day  Patient currently is medically stable to d/c.  Disposition: awaiting SNF  Subjective:  Patient was dozing off in the room, arousable.  All 3 children were at bedside, daughter [name] reported that the patient had an episode of emesis yesterday, 4 episodes of diarrhea that "went up his back".  She also states she is concerned about his mentation as yesterday he just kept repeating "oh honey" and will not participate in PT.  Objective: Temp:  [98.2 F (36.8 C)-99.8 F (37.7 C)] 99.5 F (37.5 C) (09/29 0339) Pulse Rate:  [91-113] 110 (09/29 0339) Resp:  [18-20] 18 (09/29 0339) BP: (146-165)/(88-116) 146/88 (09/29 0339) SpO2:  [94 %-99 %] 95 % (09/29 0339) Weight:  [97.5 kg] 97.5 kg (09/29 0339) Physical Exam: General: Elderly male, sitting up in bed eating breakfast, children at bedside CV: RRR,  1+ pitting edema in b/l LE Resp: Comfortable on room air, CTAB Abd: distended, tender to palpation, hepatomegaly noted Neuro: dozing off during exam but arousable, responds mostly in yes and no grunts.  Laboratory: Recent Labs  Lab 06/14/20 0959  WBC 8.0  HGB 11.9*  HCT 32.4*  PLT 173   Recent  Labs  Lab 06/16/20 0531  06/17/20 1305 06/19/20 0359  NA 141 139 141  K 3.6 3.8 3.5  CL 110 111 109  CO2 17* 18* 20*  BUN 77* 68* 51*  CREATININE 4.80* 3.84* 3.15*  CALCIUM 7.7* 7.8* 7.9*  GLUCOSE 104* 132* 118*     Imaging/Diagnostic Tests: No results found.   Rise Patience, DO 06/20/2020, 5:42 AM PGY-1, [place] Intern pager: [phone], text pages welcome

## 2020-06-20 NOTE — Plan of Care (Signed)
°  Problem: Clinical Measurements: Goal: Ability to maintain clinical measurements within normal limits will improve Outcome: Progressing   Problem: Activity: Goal: Risk for activity intolerance will decrease Outcome: Progressing   Problem: Nutrition: Goal: Adequate nutrition will be maintained Outcome: Progressing   Problem: Skin Integrity: Goal: Risk for impaired skin integrity will decrease Outcome: Progressing

## 2020-06-20 NOTE — Progress Notes (Signed)
OT Cancellation Note  Patient Details Name: Charles Schmidt MRN: [id number] DOB: 08/03/1941   Cancelled Treatment:     Pt. Was not alert enough to participate.   Nereyda Bowler 06/20/2020, 12:31 PM

## 2020-06-20 NOTE — Progress Notes (Signed)
Manufacturing engineer [facility])  Referral received for residential hospice at [facility].   [facility] does not have a bed to offer today.  Will update TOC and family once a bed is available.  Venia Carbon RN, BSN, [facility] Liaison

## 2020-06-20 NOTE — Progress Notes (Signed)
IP PROGRESS NOTE   Subjective:  He had diarrhea again yesterday.  His daughter is at the bedside.  She reports he has been confused.  Objective: Vital signs in last 24 hours: Blood pressure (!) 127/92, pulse (!) 102, temperature 98.9 F (37.2 C), temperature source Oral, resp. rate 18, height _0  (1.93 m), weight 215 lb (97.5 kg), SpO2 100 %.  Intake/Output from previous day: 09/28 0701 - 09/29 0700 In: 740 [P.O.:740] Out: 1325 [Urine:1325]  Physical Exam:  Abdomen: Marked hepatomegaly extending into the left abdomen, tender in the mid and left abdomen Extremities: No edema Neurologic: Alert, moves all extremities to command, repeats "Oh Dr. ", Not speaking in sentences, could name the hospital.  Could not name his daughter  Lab Results: Recent Labs    06/20/20 1117  WBC 7.1  HGB 11.2*  HCT 33.1*  PLT 171    BMET Recent Labs    06/19/20 0359 06/20/20 1117  NA 141 142  K 3.5 3.4*  CL 109 112*  CO2 20* 19*  GLUCOSE 118* 145*  BUN 51* 48*  CREATININE 3.15* 2.87*  CALCIUM 7.9* 7.9*    No results found for: CEA1  Studies/Results: No results found. CT images reviewed  Medications: I have reviewed the patient's current medications.  Assessment/Plan: 1.Metastatic carcinoid tumor, biopsy of a liver lesion 10/23/2017 consistent with a well-differentiated neuroendocrine neoplasm, WHO grade 2,ki-6710%  CT abdomen/pelvis 10/13/2017-extensive liver metastases, cirrhosis, soft tissue mass in the small bowel mesentery versus a primary small bowel tumor, tiny lucent lesions in the pelvic bones  Elevated chromogranin A and 24-hour urine 5-HIAA  CT chest 11/24/2017-subpleural nodule in the left lower lobe with associated radiotracer activity on comparison DOTATATEPET scan. No additional evidence of thoracic metastasis.  DOTATATE PET scan3/01/2018-intense radiotracer accumulation with innumerable confluent hepatic metastasis; intense radiotracer activity within central  mesenteric mass; 2 foci of uptake associated with the small bowel; intense radiotracer activity associated periaortic and paraspinal lymph nodes; more distant solitary small metastasis within the pleural space left lower lobe.  Monthly Sandostatin initiated 11/26/2017, dose increased to 30 mg 01/21/2018  Cycle 1 Lutathera 03/03/2018, monthly Sandostatin continued  Cycle 2 Lutathera 04/27/2018, monthly Sandostatin continued  Cycle 3 Lutathera 06/23/2018  Cycle 4 Lutathera 08/18/2018  Restaging dotatate PET scan 09/29/2018-multiple lesions again demonstrated within the liver which accumulate the radiotracer. The number of lesions which accumulate the tracer has decreased visually compared to the prior exam. Some lesions are no longer evident along the right hepatic margin. Remaining measurable lesions have radiotracer activity similar to prior. No new lesions present. Central mesenteric mass unchanged and remains avid for radiotracer. Adjacent lesion within the small bowel SUV max equal 10.5 compared to SUV max equal 15.8. Smaller lesion previously identified in the upper pelvis small bowel appears decreased in activity. No new metastatic lesions. Lesion position between the right psoas muscle and spine SUV max equal to 21.8 compared with SUV max equal 26.2.  Dotatate PET 10/19/2019-no evidence of disease progression, stable multifocal hepatic metastases, unchanged from post therapy scan 03/30/2019 and improved from pretherapy scan5/01/2018. Stable mesenteric mass, improved retroperitoneal lymph node adjacent to the celiac trunk, solitary small bowel lesion unchanged  Monthly Sandostatin continued  01/26/2020 chromogranin A level stable elevation  CT abdomen/pelvis 06/05/2020-slight enlargement of central mesenteric mass, extensive hepatic metastases-unchanged, bowel wall thickening in central small bowel loops and right colon  2.Diarrhea-likely secondary to carcinoid syndrome, improved with  Sandostatin 3.History of anorexia/weight loss-improved 4.Prostatic hypertrophy;status post laser vaporization 01/05/2018 5.  Mild pancytopenia following Lutathera treatment 6.Thoracic dermatomal zoster rash 04/02/2019 treated with Valtrex 7.  Admission 06/05/2020 after a fall with failure to thrive, increased diarrhea, nausea, and abdominal pain 8.  Markedly elevated AST and CPK on admission -likely rhabdomyolysis 9.  Acute renal failure-improved   Charles Schmidt developed increased diarrhea yesterday.  He is confused this morning.  Recommendations: 1.  Continue octreotide 150 mg every 8 hours, record each bowel movement resume long-acting Sandostatin or lanreotide at discharge.  2.  Further evaluation of the altered mental status, infection?  [name] has declined over the past day.  He has altered mental status and increased diarrhea.  His symptoms could be related to an infection.  I will discuss his case with the medical team.  He will need to remain hospitalized with the acute change in his mental status.   LOS: 15 days   Betsy Coder, MD   06/20/2020, 1:11 PM

## 2020-06-20 NOTE — Progress Notes (Signed)
CSW met with pt's son [name] at bedside, he stated they would like residential hospice with [facility], doctor made aware, referral made to [facility].

## 2020-06-21 ENCOUNTER — Inpatient Hospital Stay (HOSPITAL_COMMUNITY): Payer: Medicare Other

## 2020-06-21 LAB — URINALYSIS, ROUTINE W REFLEX MICROSCOPIC
Bilirubin Urine: NEGATIVE
Glucose, UA: NEGATIVE mg/dL
Ketones, ur: NEGATIVE mg/dL
Nitrite: NEGATIVE
Protein, ur: 100 mg/dL — AB
RBC / HPF: >50 RBC/hpf — ABNORMAL HIGH (ref 0–5)
Specific Gravity, Urine: 1.015 (ref 1.005–1.030)
WBC, UA: >50 WBC/hpf — ABNORMAL HIGH (ref 0–5)
pH: 5 (ref 5.0–8.0)

## 2020-06-21 MED ORDER — GERHARDT'S BUTT CREAM
TOPICAL_CREAM | Freq: Two times a day (BID) | CUTANEOUS | Status: DC
  Administered 2020-07-04: 1 via TOPICAL
  Filled 2020-06-21 (×2): qty 1

## 2020-06-21 MED ORDER — VANCOMYCIN HCL 1500 MG/300ML IV SOLN
1500.0000 mg | Freq: Once | INTRAVENOUS | Status: AC
  Administered 2020-06-21: 1500 mg via INTRAVENOUS
  Filled 2020-06-21: qty 300

## 2020-06-21 MED ORDER — SODIUM CHLORIDE 0.9 % IV BOLUS
500.0000 mL | Freq: Once | INTRAVENOUS | Status: AC
  Administered 2020-06-21: 500 mL via INTRAVENOUS

## 2020-06-21 MED ORDER — ACETAMINOPHEN 325 MG PO TABS
650.0000 mg | ORAL_TABLET | Freq: Four times a day (QID) | ORAL | Status: DC | PRN
  Administered 2020-06-21: 650 mg via ORAL
  Filled 2020-06-21: qty 2

## 2020-06-21 MED ORDER — VANCOMYCIN HCL IN DEXTROSE 1-5 GM/200ML-% IV SOLN: 1000.0000 mg | INTRAVENOUS | Status: DC

## 2020-06-21 MED ORDER — OCTREOTIDE ACETATE 50 MCG/ML IJ SOLN
150.0000 ug | Freq: Four times a day (QID) | INTRAMUSCULAR | Status: DC
  Administered 2020-06-21 – 2020-06-29 (×28): 150 ug via SUBCUTANEOUS
  Filled 2020-06-21 (×35): qty 3

## 2020-06-21 MED ORDER — SODIUM CHLORIDE 0.9 % IV SOLN
2.0000 g | INTRAVENOUS | Status: DC
  Administered 2020-06-21: 2 g via INTRAVENOUS
  Filled 2020-06-21: qty 2

## 2020-06-21 MED ORDER — DIPHENOXYLATE-ATROPINE 2.5-0.025 MG PO TABS
1.0000 | ORAL_TABLET | ORAL | Status: DC | PRN
  Administered 2020-06-21: 1 via ORAL
  Filled 2020-06-21: qty 1

## 2020-06-21 MED ORDER — POTASSIUM CHLORIDE CRYS ER 20 MEQ PO TBCR
20.0000 meq | EXTENDED_RELEASE_TABLET | Freq: Once | ORAL | Status: AC
  Administered 2020-06-21: 20 meq via ORAL
  Filled 2020-06-21: qty 1

## 2020-06-21 NOTE — Progress Notes (Signed)
Physical Therapy Treatment Patient Details Name: Charles Schmidt MRN: [id number] DOB: 1941/02/11 Today's Date: 06/21/2020    History of Present Illness Charles Schmidt is a 79 y.o. male presenting with weakness in the setting of vomiting and diarrhea. PMH is significant for metastatic carcinoid tumor, BPH s/p laser vaporization, and HTN.  On precautions for Cdiff.    PT Comments    Pt with limited participation in session; pt unable to arouse/become alert to participate; family asked for education regarding PROM activities and to assists with upright posture; pt total assist for repositioning; family verbalized understanding for PROM activities; pt continues to demonstrate overall weakness, poor endurance and increased assist needed for all functional mobility tasks; family unsure of discharge plan at this time, pt will benefit from additional skilled PT to progress to OOB activities or EOB activities to maximize independence with functional mobility prior to discharge.     Follow Up Recommendations  Supervision/Assistance - 24 hour;SNF     Equipment Recommendations  None recommended by PT    Recommendations for Other Services       Precautions / Restrictions Precautions Precautions: Fall    Mobility  Bed Mobility                  Transfers                    Ambulation/Gait                 Stairs             Wheelchair Mobility    Modified Rankin (Stroke Patients Only)       Balance                                            Cognition Arousal/Alertness: Lethargic Behavior During Therapy: Flat affect                                          Exercises General Exercises - Lower Extremity Ankle Circles/Pumps: PROM;Both;10 reps;Supine Hip ABduction/ADduction: PROM;10 reps;Both;Supine Hip Flexion/Marching: PROM;Both;10 reps;Supine    General Comments General comments (skin integrity, edema, etc.):  pt not alert during session; family requesting education on LE ROM activities for them to perform; therapist providing education and demonstration on hip/knee flexion, hip abd/add, ankle DF PROM activities; pt also repositioned in the bed wtih pillows to improve upright posture, total assist +2 due to lethargy      Pertinent Vitals/Pain Pain Assessment: Faces Faces Pain Scale: Hurts little more Pain Location: low back, BLEs Pain Descriptors / Indicators: Moaning;Grimacing    Home Living                      Prior Function            PT Goals (current goals can now be found in the care plan section) Acute Rehab PT Goals Patient Stated Goal: to go home PT Goal Formulation: With patient/family Time For Goal Achievement: 06/22/20 Potential to Achieve Goals: Poor Progress towards PT goals: Not progressing toward goals - comment (pt has declined per daugther; decreased participation wtih only passive ROM activities performed)    Frequency    Min 2X/week      PT Plan Current plan remains appropriate  Co-evaluation              AM-PAC PT "6 Clicks" Mobility   Outcome Measure  Help needed turning from your back to your side while in a flat bed without using bedrails?: Total Help needed moving from lying on your back to sitting on the side of a flat bed without using bedrails?: Total Help needed moving to and from a bed to a chair (including a wheelchair)?: Total Help needed standing up from a chair using your arms (e.g., wheelchair or bedside chair)?: Total Help needed to walk in hospital room?: Total Help needed climbing 3-5 steps with a railing? : Total 6 Click Score: 6    End of Session   Activity Tolerance: Treatment limited secondary to medical complications (Comment) Patient left: with family/visitor present;with call bell/phone within reach;in bed Nurse Communication: Mobility status PT Visit Diagnosis: Muscle weakness (generalized)  (M62.81);Unsteadiness on feet (R26.81)     Time: 8421-0312 PT Time Calculation (min) (ACUTE ONLY): 18 min  Charges:  $Therapeutic Activity: 8-22 mins                     Lyanne Co, DPT Acute Rehabilitation Services [phone]   [name] 06/21/2020, 2:49 PM

## 2020-06-21 NOTE — Progress Notes (Addendum)
FPTS Interim Progress Note  Recived notification that patient has a bed at [facility] and can be discharged today. If he does not accept bed today he will lose it. Dr. Oleh Genin and I went to discuss this with family. Daughter [name]) was present and son [name]) on phone. Informed family of work up thus far. CXR concerning for possible bilateral PNA vs fluids. No respiratory symptoms and no other source of infection identified to explain fever although remaining labs pending. Tonya voices interest in transfer to hospice today however [name] is addament on treatment of pneumonia. After thorough discussion of each option, taking in consideration of patient's wishes that were previously stated, family has opted to treat infection with antibiotics. Lavella Lemons noted that once infection has been treated she would like patient to be transferred to hospice if bed available.  Plan: Sepsis likely 2/2 Pneumonia: - IV Vanc/Cefepime for HAP - follow up UA, urine culture, blood culture - will notify CSW of family decision  - continue [place] discussion - s/p 572mL NS bolus - currently drinking fluids  Danna Hefty, DO 06/21/2020, 1:13 PM PGY-3, [place] Medicine Service pager [phone]

## 2020-06-21 NOTE — Progress Notes (Signed)
Family Medicine Teaching Service Daily Progress Note Intern Pager: [phone]  Patient name: Charles Schmidt Medical record number: [id number] Date of birth: 05-Jun-1941 Age: 79 y.o. Gender: male  Primary Care Provider: Nolene Ebbs, MD Consultants: Oncology, Palliative, Urology, Nephrology Code Status: DNR   Pt Overview and Major Events to Date:  9/14: Admitted for weakness, vomiting, diarrhea. Oncology consulted 9/15: Extreme elevation in AST, GI and surgery consulted, CTA abd/pelvis obtained 9/16: Developed urinary retention and AKI, foley placed, urology and nephrology consulted 9/17: Worsening kidney function and hyperphosphatemia 9/24: Family meeting with palliative-decision for SNF placement  Assessment and Plan: Charles Schmidt a 79 y.o.malepresenting with weakness in the setting of vomiting and diarrhea, subsequently found to have rhabdomyolysis.PMH is significant for metastatic carcinoid tumor, BPH s/p laser vaporization, and HTN.   Fever with tachycardia Overnight patient had a fever of 100.5, patient has been in the hospital for 15 days and has a foley in place. Patient has had elevated heart rates during his stay, but has increased to >110 consistently overnight. Patient does not report symptoms, but typically only responds with yes and no grunts that sometimes conflict the physical exam. WBC 7.1. Respiratory panel (Flu A, Flu B, COVID) negative. DDX: UTI (patient has had a foley for several days, PNA (patient has no cough, but has been mostly bed-bound due to weakness), skin infection (daughter notes sore on buttocks and patient has recurrent diarrhea).   - Getting blood culture - Obtain clean U/A with culture - Obtain CXR, KUB - EKG - NS bolus 565mL - Vitals q4h - Consider checking C.diff    Hypokalemia Patient K today is 3.4, previously has been wnl. Replete as necessary - Replete with 20 mEq K  ARF  Hyperphosphatemia, improving UOP 0.4 mL/kg/hr in the last 24  hours. Cr 3.15>2.87, GFR 20. Continue to monitor to determine continued need for bicarb. -Foley in place -Strict I's/O's -p.o. as tolerated -Monitor kidney function with CMP's every other day --Continue bicarb with evaluation for continuation with labs -Continue compression stockings  Hiccups, improved Patient has no hiccups on exam, and has not for the last 2 days. -Baclofen 5 mg if continued.  HTN Patient BP has remained elevated for several days, still ranging from 140-160s/80-110s. -Continue to monitor --Continue Norvasc 5mg   Goals of Care Patient with metastatic carcinoid tumor and worsening kidney function. Current weakness with limited PO intake, family in talks with SW about disposition location. -Disposition [facility]  Weakness, improving Patient was admitted with weakness, previously able to do ADLs and is no longer able to at this time.  Patient is still having weakness, but is motivated to participate. -Continue PT/OT  Diarrhea Daughter reports patient had at least 2 episodes of diarrhea overnight, she feels like there were several more during the day and that they are becoming more frequent. -Continue octreotide 150 mg per oncology Dr. Benay Spice - Continue to monitor - Flexi-seal rectal tube  Metastatic Carcinoid Tumor , Stable Patient with hx of carcinoid tumor with mesenteric mass and innumerable hepatic metastasis. Had been receiving monthly sandostatin prior to admission. -Oncology following, appreciate recommendations -Palliative consulted for [place] discussion, appreciate recommendations   FEN/GI: Renal diet PPx: Renally adjusted Lovenox  Status is: Inpatient Remains inpatient appropriate because: Awaiting disposition to SNF for safe discharge.  Dispo: The patient is from: Home  Anticipated d/c is to: SNF vs Hospice   Anticipated d/c date is: 1 day  Patient currently is medically stable to d/c.  Disposition:  awaiting SNF  Subjective:  Patient is seen in the room, arousable.  Daughter [name] at bedside, she reports that he has had increased episodes of diarrhea and is concerned that he is not being cleaned up as fast.  She also notes that he has a red area on his buttocks.  Objective: Temp:  [98.9 F (37.2 C)-100.5 F (38.1 C)] 100.1 F (37.8 C) (09/30 0438) Pulse Rate:  [102-123] 113 (09/30 0438) Resp:  [16-20] 16 (09/30 0438) BP: (127-144)/(87-97) 144/90 (09/30 0438) SpO2:  [93 %-100 %] 95 % (09/30 0438) Weight:  [98 kg] 98 kg (09/30 0432) Physical Exam: General: Elderly male, sitting up with daughter in room. CV: tachycardia,  1+ pitting edema in b/l LE, no compression stockings in place at the moment  Resp: Comfortable on room air, CTAB Abd: distended, tender to palpation, hepatomegaly noted Skin: Attempted to see patient skin on bottom, patient had diarrheal event before able to visualize. Neuro: dozing off during exam but arousable, responds mostly in yes and no grunts.  Laboratory: Recent Labs  Lab 06/14/20 0959 06/20/20 1117  WBC 8.0 7.1  HGB 11.9* 11.2*  HCT 32.4* 33.1*  PLT 173 171   Recent Labs  Lab 06/17/20 1305 06/19/20 0359 06/20/20 1117  NA 139 141 142  K 3.8 3.5 3.4*  CL 111 109 112*  CO2 18* 20* 19*  BUN 68* 51* 48*  CREATININE 3.84* 3.15* 2.87*  CALCIUM 7.8* 7.9* 7.9*  PROT  --   --  5.8*  BILITOT  --   --  1.1  ALKPHOS  --   --  253*  ALT  --   --  20  AST  --   --  32  GLUCOSE 132* 118* 145*     Imaging/Diagnostic Tests: No results found.   Rise Patience, DO 06/21/2020, 5:53 AM PGY-1, [place] Intern pager: [phone], text pages welcome

## 2020-06-21 NOTE — Consult Note (Addendum)
[place] Nurse Consult Note: Reason for Consult: Consult requested for buttocks.  Performed remotely after review of progress notes and photos in the EMR. Wound type: Buttocks are red and macerated with patchy areas of full thickness skin loss; appearance is consistent with moisture associated skin damage related to frequent stool incontinence. There ais a full thickness fissure located in the gluteal fold, also related to MASD. This is NOT a pressure injury Dressing procedure/placement/frequency: Topical treatment orders provided for bedside nurses to perform as follows to repel moisture and promote healing: Apply Gerhardts butt cream BID and PRN when turning and cleaning; leave foam dressings off to avoid trapping stool underneath the dressing. Please re-consult if further assistance is needed.  Thank-you,  Julien Girt MSN, [place], [place], [place], [place]

## 2020-06-21 NOTE — Progress Notes (Signed)
FPTS Interim Progress Note  S: Went to see patient this morning for a.m. check.  He is moaning over and over again, which is a change from a prior visit a week ago.  He denies any pain or discomfort but pulls my hand away when palpating his abdomen.  He appears uncomfortable when evaluating his bottom.  Per daughter, he does not recognize her anymore.  He is repeating phrases over and over. She feels that he really needs a rectal tube to help with his watery stool. She feels that once he gets the rectal tube, she would be comfortable with him going to hospice. She is tearful but in agreement when explained that this is not a quality of life. w  O: BP (!) 144/90 (BP Location: Right Arm)   Pulse (!) 113   Temp 100.1 F (37.8 C) (Oral)   Resp 16   Ht 6\' 4"  (1.93 m)   Wt 98 kg   SpO2 95%   BMI 26.29 kg/m   Gen: Appears ill, moaning over and over throughout exam Resp: difficult to appreciate given shallow breaths and moaning, could appreciate adequate air flow through upper and middle lobes, lower lobes difficult to appreciate. Breathing comfortably on room air Abd: Distended, tympanic, and tender to palpation on left upper lobe, with firmness noted over splenic area  Extremities: 2+ pitting edema in LE bilaterally  Skin: superficial skin breakdown along R>L buttocks to posterior thigh with erythema   A/P: Fever of unknown origin  Tachycardia: - blood cultures - clean urinalysis after reinsertion of foley catheter - CXR, KUB - monitor vitals q4 - insert rectal tube to help with skin breakdown and continued watery diarrhea - 585mL NS bolus  - EKG - wound consult   Danna Hefty, DO 06/21/2020, 9:35 AM PGY-3, [place] Family [facility] Service pager [phone]

## 2020-06-21 NOTE — Progress Notes (Signed)
FPTS Interim Progress Note  Touched base with Dr. Benay Spice regarding patient's watery diarrhea. He recommended transitioning Octreotide to q6 hours and adding Lomotil: 1 tablet after each loose stool for up to 8 tabs/day. Unfortunately patient will likely not be able to continue these medications at discharge to hospice.   We greatly appreciate Dr. Gearldine Shown assistance with caring for Mr. Alvarenga. He does continue to encourage hospice given decline in clinical status and will continue to discuss Derma with family going forward. He plans to see patient tomorrow.   Mina Marble [place], DO 06/21/2020, 4:18 PM PGY-3, [place] Service pager [phone]

## 2020-06-21 NOTE — Progress Notes (Signed)
Patient temperature of 101.50F. No prns.  MD notified.

## 2020-06-21 NOTE — Progress Notes (Signed)
Palliative Medicine RN Note: Rec'd call from Dr Chauncey Reading asking about treating pneumonia w PO abx at [facility]. I spoke with [facility] liaison Crislyn, who has been very involved with this family, and she has actually been in contact with them several times this morning. Family was on board for [facility] until they heard about pna, and now they want it treated before, even after discussions with Crislyn. I updated Dr Chauncey Reading via secure chat.  Marjie Skiff Vallerie Hentz, RN, BSN, [facility] Palliative Medicine Team 06/21/2020 1:35 PM Office [phone]

## 2020-06-21 NOTE — Progress Notes (Signed)
Pharmacy Antibiotic Note  Charles Schmidt is a 79 y.o. male admitted on 06/05/2020 with pneumonia.  Pharmacy has been consulted for Vanco/Cefepime dosing.  ID: Abx for HCAP. Temp 100.5, WBC wnl, LA 4, PCT 0.2, Scr 2.87 improving  Vanco 9/30>> Cefepime 9/30>>  9/15 GI panel neg 9/27 flu A/flu B/COVID neg 9/30 Bcx sent:   Plan: Cefepime 2g IV q24h Vanco 1500mg  IV x 1 then 1g IV q24h.  Trough after 3-5 doses at steady state.     Height: 6\' 4"  (193 cm) Weight: 98 kg (216 lb) IBW/kg (Calculated) : 86.8  Temp (24hrs), Avg:99.6 F (37.6 C), Min:98.4 F (36.9 C), Max:100.5 F (38.1 C)  Recent Labs  Lab 06/16/20 0531 06/17/20 1305 06/19/20 0359 06/20/20 1117  WBC  --   --   --  7.1  CREATININE 4.80* 3.84* 3.15* 2.87*    Estimated Creatinine Clearance: 25.6 mL/min (A) (by C-G formula based on SCr of 2.87 mg/dL (H)).    No Known Allergies  Charles Schmidt, PharmD, BCPS Clinical Staff Pharmacist [email or web address]  Charles Schmidt 06/21/2020 2:51 PM

## 2020-06-21 NOTE — Progress Notes (Signed)
PT Cancellation Note  Patient Details Name: Charles Schmidt MRN: [id number] DOB: 1941/05/29   Cancelled Treatment:     pt having EKG, will attempt in PM time permitting  Lyanne Co, DPT Acute Rehabilitation Services [phone]   [name] 06/21/2020, 9:55 AM

## 2020-06-21 NOTE — Progress Notes (Signed)
Manufacturing engineer [facility]) Hospital Liaison note.     Received a text from daughter [name]  this morning in which she said she had spoken with Dr. Benay Spice and the family wanted to accept the bed at [facility]. Spoke with her by phone at 1130am and learned that family currently is choosing to pursue treatment and declines the bed at [facility] at this time.  TOC made aware.    Tonya asked that [facility] liaison reach out tomorrow morning to discuss evolving goals of care. Thank you for the opportunity to participate in this patient's care.  Domenic Moras, BSN, RN [facility] Liaison (listed on AMION under Hospice/[facility])    [phone]   (24h on call)

## 2020-06-21 NOTE — Progress Notes (Addendum)
IP PROGRESS NOTE   Subjective:  Less confused this morning.  Had fever overnight.  Still with diarrhea and rectal tube has been placed.  Remains on octreotide.  Work-up currently in progress for infection including a urinalysis, urine culture, blood cultures, and C. difficile.  Chest x-ray and abdominal x-ray ordered and results are pending.  Objective: Vital signs in last 24 hours: Blood pressure (!) (P) 144/90, pulse (!) (P) 106, temperature (P) 98.4 F (36.9 C), temperature source (P) Oral, resp. rate (P) 15, height 6' 4"  (1.93 m), weight 98 kg, SpO2 (P) 96 %.  Intake/Output from previous day: 09/29 0701 - 09/30 0700 In: 1300 [P.O.:1300] Out: 975 [Urine:975]  Physical Exam:  Abdomen: Marked hepatomegaly extending into the left abdomen, tender in the mid and left abdomen Extremities: No edema Neurologic: Alert, moves all extremities to command  Lab Results: Recent Labs    06/20/20 1117  WBC 7.1  HGB 11.2*  HCT 33.1*  PLT 171    BMET Recent Labs    06/19/20 0359 06/20/20 1117  NA 141 142  K 3.5 3.4*  CL 109 112*  CO2 20* 19*  GLUCOSE 118* 145*  BUN 51* 48*  CREATININE 3.15* 2.87*  CALCIUM 7.9* 7.9*    No results found for: CEA1  Studies/Results: DG Abd 1 View  Result Date: 06/21/2020 CLINICAL DATA:  Abdominal distension in fever EXAM: ABDOMEN - 1 VIEW COMPARISON:  Abdominal radiograph June 07, 2020; CT abdomen and pelvis June 06, 2020 FINDINGS: Relative opacity in the upper abdomen is stable, consistent with liver enlargement. There may be underlying ascites is well. There is no bowel dilatation or air-fluid level to suggest bowel obstruction. No appreciable free air evident. IMPRESSION: No findings indicative of overt bowel obstruction or free air. Suspect liver enlargement with potential ascites given opacity in the upper abdomen. This appearance is similar to prior studies. Electronically Signed   By: Lowella Grip III M.D.   On: 06/21/2020 11:40    DG CHEST PORT 1 VIEW  Result Date: 06/21/2020 CLINICAL DATA:  Fever EXAM: PORTABLE CHEST 1 VIEW COMPARISON:  June 08, 2020 FINDINGS: There are apparent pleural effusions bilaterally with areas of atelectasis and suspected consolidation in both lower lobes. Heart is enlarged with pulmonary vascularity normal. No adenopathy. No bone lesions. IMPRESSION: Airspace opacity in both mid and lower lung regions with underlying pleural effusions, similar to most recent study. Stable cardiac prominence. Suspect bilateral pneumonia, although a degree of pulmonary edema may also be present. Electronically Signed   By: Lowella Grip III M.D.   On: 06/21/2020 11:38   CT images reviewed  Medications: I have reviewed the patient's current medications.  Assessment/Plan: 1.Metastatic carcinoid tumor, biopsy of a liver lesion 10/23/2017 consistent with a well-differentiated neuroendocrine neoplasm, WHO grade 2,ki-6710%  CT abdomen/pelvis 10/13/2017-extensive liver metastases, cirrhosis, soft tissue mass in the small bowel mesentery versus a primary small bowel tumor, tiny lucent lesions in the pelvic bones  Elevated chromogranin A and 24-hour urine 5-HIAA  CT chest 11/24/2017-subpleural nodule in the left lower lobe with associated radiotracer activity on comparison DOTATATEPET scan. No additional evidence of thoracic metastasis.  DOTATATE PET scan3/01/2018-intense radiotracer accumulation with innumerable confluent hepatic metastasis; intense radiotracer activity within central mesenteric mass; 2 foci of uptake associated with the small bowel; intense radiotracer activity associated periaortic and paraspinal lymph nodes; more distant solitary small metastasis within the pleural space left lower lobe.  Monthly Sandostatin initiated 11/26/2017, dose increased to 30 mg 01/21/2018  Cycle 1  Lutathera 03/03/2018, monthly Sandostatin continued  Cycle 2 Lutathera 04/27/2018, monthly Sandostatin continued  Cycle 3  Lutathera 06/23/2018  Cycle 4 Lutathera 08/18/2018  Restaging dotatate PET scan 09/29/2018-multiple lesions again demonstrated within the liver which accumulate the radiotracer. The number of lesions which accumulate the tracer has decreased visually compared to the prior exam. Some lesions are no longer evident along the right hepatic margin. Remaining measurable lesions have radiotracer activity similar to prior. No new lesions present. Central mesenteric mass unchanged and remains avid for radiotracer. Adjacent lesion within the small bowel SUV max equal 10.5 compared to SUV max equal 15.8. Smaller lesion previously identified in the upper pelvis small bowel appears decreased in activity. No new metastatic lesions. Lesion position between the right psoas muscle and spine SUV max equal to 21.8 compared with SUV max equal 26.2.  Dotatate PET 10/19/2019-no evidence of disease progression, stable multifocal hepatic metastases, unchanged from post therapy scan 03/30/2019 and improved from pretherapy scan5/01/2018. Stable mesenteric mass, improved retroperitoneal lymph node adjacent to the celiac trunk, solitary small bowel lesion unchanged  Monthly Sandostatin continued  01/26/2020 chromogranin A level stable elevation  CT abdomen/pelvis 06/05/2020-slight enlargement of central mesenteric mass, extensive hepatic metastases-unchanged, bowel wall thickening in central small bowel loops and right colon  2.Diarrhea-likely secondary to carcinoid syndrome, improved with Sandostatin 3.History of anorexia/weight loss-improved 4.Prostatic hypertrophy;status post laser vaporization 01/05/2018 5. Mild pancytopenia following Lutathera treatment 6.Thoracic dermatomal zoster rash 04/02/2019 treated with Valtrex 7.  Admission 06/05/2020 after a fall with failure to thrive, increased diarrhea, nausea, and abdominal pain 8.  Markedly elevated AST and CPK on admission -likely rhabdomyolysis 9.  Acute  renal failure-improved   Charles Schmidt's confusion has improved today.  Has now developed fevers and infectious work-up is in progress.  Family planning to discharge to [facility] when bed available.  Recommendations: 1.  Continue changing octreotide to every 6 hours, record each bowel movement.  Add Lomotil as needed  2.  We agree with transfer to [facility] once bed is available.  We will follow up with the patient at their facility.   LOS: 16 days   Charles Bussing, NP   06/21/2020, 2:09 PM Mr. Gladden was interviewed and examined.  His daughter was at the bedside when I saw him early this morning.  He appeared more alert today, but continues to have a degree of confusion.  Diarrhea has returned.  We will increase the dose of octreotide and add Lomotil.  I will contact the hospice medical director regarding the ability to continue octreotide at [facility].  His daughter understands the poor prognosis and agrees with hospice care and transfer to [facility].  I agree with treating the acute infection while waiting on transfer to [facility].

## 2020-06-21 NOTE — Progress Notes (Signed)
   06/21/20 2037  Assess: MEWS Score  Level of Consciousness Alert  Assess: MEWS Score  MEWS Temp 0  MEWS Systolic 0  MEWS Pulse 1  MEWS RR 0  MEWS LOC 0  MEWS Score 1  MEWS Score Color Green  Assess: if the MEWS score is Yellow or Red  Were vital signs taken at a resting state? Yes  Focused Assessment No change from prior assessment  Early Detection of Sepsis Score *See Row Information* High  MEWS guidelines implemented *See Row Information* No, previously yellow, continue vital signs every 4 hours  Document  Patient Outcome Other (Comment) (chronic yellow)  Progress note created (see row info) Yes   Patient with yellow MEWS.  Patient has been a chronic yellow this admission.  VS will be checked every four hours.

## 2020-06-22 DIAGNOSIS — R7881 Bacteremia: Secondary | ICD-10-CM

## 2020-06-22 DIAGNOSIS — B957 Other staphylococcus as the cause of diseases classified elsewhere: Secondary | ICD-10-CM

## 2020-06-22 LAB — COMPREHENSIVE METABOLIC PANEL
ALT: 22 U/L (ref 0–44)
AST: 40 U/L (ref 15–41)
Albumin: 1.6 g/dL — ABNORMAL LOW (ref 3.5–5.0)
Alkaline Phosphatase: 243 U/L — ABNORMAL HIGH (ref 38–126)
Anion gap: 13 (ref 5–15)
BUN: 47 mg/dL — ABNORMAL HIGH (ref 8–23)
CO2: 17 mmol/L — ABNORMAL LOW (ref 22–32)
Calcium: 7.8 mg/dL — ABNORMAL LOW (ref 8.9–10.3)
Chloride: 112 mmol/L — ABNORMAL HIGH (ref 98–111)
Creatinine, Ser: 2.61 mg/dL — ABNORMAL HIGH (ref 0.61–1.24)
GFR calc Af Amer: 26 mL/min — ABNORMAL LOW (ref >60)
GFR calc non Af Amer: 22 mL/min — ABNORMAL LOW (ref >60)
Glucose, Bld: 149 mg/dL — ABNORMAL HIGH (ref 70–99)
Potassium: 3.6 mmol/L (ref 3.5–5.1)
Sodium: 142 mmol/L (ref 135–145)
Total Bilirubin: 1.1 mg/dL (ref 0.3–1.2)
Total Protein: 6.1 g/dL — ABNORMAL LOW (ref 6.5–8.1)

## 2020-06-22 LAB — CBC
HCT: 31.1 % — ABNORMAL LOW (ref 39.0–52.0)
Hemoglobin: 10.3 g/dL — ABNORMAL LOW (ref 13.0–17.0)
MCH: 25.7 pg — ABNORMAL LOW (ref 26.0–34.0)
MCHC: 33.1 g/dL (ref 30.0–36.0)
MCV: 77.6 fL — ABNORMAL LOW (ref 80.0–100.0)
Platelets: 121 10*3/uL — ABNORMAL LOW (ref 150–400)
RBC: 4.01 MIL/uL — ABNORMAL LOW (ref 4.22–5.81)
RDW: 21.5 % — ABNORMAL HIGH (ref 11.5–15.5)
WBC: 10.1 10*3/uL (ref 4.0–10.5)
nRBC: 0 % (ref 0.0–0.2)

## 2020-06-22 LAB — BLOOD CULTURE ID PANEL (REFLEXED) - BCID2

## 2020-06-22 MED ORDER — DIPHENOXYLATE-ATROPINE 2.5-0.025 MG PO TABS
2.0000 | ORAL_TABLET | Freq: Four times a day (QID) | ORAL | Status: DC
  Administered 2020-06-22: 2 via ORAL
  Filled 2020-06-22: qty 2

## 2020-06-22 MED ORDER — DIPHENOXYLATE-ATROPINE 2.5-0.025 MG PO TABS
1.0000 | ORAL_TABLET | Freq: Four times a day (QID) | ORAL | Status: DC
  Administered 2020-06-22 – 2020-06-23 (×6): 1 via ORAL
  Filled 2020-06-22 (×7): qty 1

## 2020-06-22 MED ORDER — BOOST / RESOURCE BREEZE PO LIQD CUSTOM
1.0000 | Freq: Three times a day (TID) | ORAL | Status: DC
  Administered 2020-06-23 – 2020-07-05 (×30): 1 via ORAL

## 2020-06-22 MED ORDER — CEFAZOLIN SODIUM-DEXTROSE 2-4 GM/100ML-% IV SOLN
2.0000 g | Freq: Two times a day (BID) | INTRAVENOUS | Status: DC
  Administered 2020-06-22 – 2020-06-24 (×4): 2 g via INTRAVENOUS
  Filled 2020-06-22 (×4): qty 100

## 2020-06-22 NOTE — Progress Notes (Signed)
FPTS Interim Progress Note  Spoke with Dr. Linus Salmons, infectious disease, regarding patient's blood culture S. epidermidis. Dr. Linus Salmons did not recommend getting an ECHO with this pathogen.   Lyndee Hensen, DO 06/22/2020, 4:30 PM PGY-2, [place] Service pager [phone]

## 2020-06-22 NOTE — Progress Notes (Signed)
Manufacturing engineer [facility]) Hospital Liaison Note:  This RN visited pt and daughter Linwood Dibbles briefly at bedside.  Linwood Dibbles, with [name] on the phone, reports family continues to be interested in pursuing treatment, decline interest in [facility] at this time.  [place] liaison teams will be available to [facility] and family as needed moving forward.  Please consult Amion for liaison working if needed.  Thank you for the opportunity to participate in this patient's care.   Domenic Moras, BSN, RN [facility] Liaison [phone] (24h on call)

## 2020-06-22 NOTE — Progress Notes (Signed)
Nutrition Follow-up  DOCUMENTATION CODES:   Not applicable  INTERVENTION:   -Continue Magic cup TID with meals, each supplement provides 290 kcal and 9 grams of protein -Boost Breeze po TID, each supplement provides 250 kcal and 9 grams of protein  NUTRITION DIAGNOSIS:   Increased nutrient needs related to wound healing as evidenced by estimated needs.  Ongoing  GOAL:   Patient will meet greater than or equal to 90% of their needs  Progressing   MONITOR:   PO intake, Supplement acceptance, Labs, Weight trends, Skin, I & O's  REASON FOR ASSESSMENT:   Low Braden    ASSESSMENT:   79 year old male presenting with weakness in the setting of vomiting and diarrhea, subsequently found to have rhabdomyolysis and acute renal failure this hospitalization.  9/30- rectal tube placed  Reviewed I/O's: -815 ml x 24 hours and -1.7 L since 06/08/20  UOP: 1.3 L x 24 hours  Pt receiving nursing care at time of attempted visit.   Case discussed with RN, who reports concern regarding poor oral intake, abdominal distention, and diarrhea. Per RN, meal intake percentages highly inaccurate as pt family members often finishing meal trays for pt. RN estimates pt is only consuming about 25% of meals. Pt was given a strawberry Ensure yesterday, which exacerbated diarrhea. Discussed that pt's antibiotics are likely also contributing to diarrhea.   Pt family continues to struggle with GOC decisions. Per RN, plan was to discharge to residential hospice, however, decision was made to aggressively treat pneumonia prior to discharge.   Labs reviewed.   Diet Order:   Diet Order            Diet renal with fluid restriction Room service appropriate? Yes; Fluid consistency: Thin  Diet effective now                 EDUCATION NEEDS:   Not appropriate for education at this time  Skin:  Skin Assessment: Skin Integrity Issues: Skin Integrity Issues:: Stage II Stage II: scrotum, sacrum  Last BM:   06/21/20 (via rectal tube)  Height:   Ht Readings from Last 1 Encounters:  06/07/20 6\' 4"  (1.93 m)    Weight:   Wt Readings from Last 1 Encounters:  06/22/20 101.2 kg    Ideal Body Weight:  91.8 kg  BMI:  Body mass index is 27.14 kg/m.  Estimated Nutritional Needs:   Kcal:  2100-2300  Protein:  90-105 grams  Fluid:  2 L    Loistine Chance, RD, LDN, [place] Registered Dietitian II Certified Diabetes Care and Education Specialist Please refer to [facility] for RD and/or RD on-call/weekend/after hours pager

## 2020-06-22 NOTE — Progress Notes (Signed)
Pharmacy Antibiotic Note  Clerance Schmidt is a 79 y.o. male admitted on 06/05/2020 with persistent vomiting. Patient with tachycardia and fevers yesterday, so blood cultures were drawn. Now growing MSSE in 2 bottles - 1 in each set. Multiple peripheral lines were removed yesterday that had been in place for over 2 weeks. Given removed lines, treating as line infection with cefazolin for 5 additional days.  Patient's SCr has been resolving over the last several days. UOP has remained relatively consistent. Plan on starting cefazolin 2g q12 with likely switch to q8h dosing if continued improvement tomorrow.  Plan: Discontinue vancomycin and cefepime Initiate cefazolin 2g q12h Monitor Scr for additional dosing adjustments   Height: 6\' 4"  (193 cm) Weight: 101.2 kg (223 lb) IBW/kg (Calculated) : 86.8  Temp (24hrs), Avg:99.6 F (37.6 C), Min:98.1 F (36.7 C), Max:101.6 F (38.7 C)  Recent Labs  Lab 06/16/20 0531 06/17/20 1305 06/19/20 0359 06/20/20 1117 06/22/20 1021  WBC  --   --   --  7.1 10.1  CREATININE 4.80* 3.84* 3.15* 2.87* 2.61*    Estimated Creatinine Clearance: 28.2 mL/min (A) (by C-G formula based on SCr of 2.61 mg/dL (H)).    No Known Allergies  Charles Schmidt, PharmD PGY2 ID Pharmacy Resident [id number]  06/22/2020 3:41 PM

## 2020-06-22 NOTE — Progress Notes (Signed)
Occupational Therapy Treatment Patient Details Name: Charles Schmidt MRN: [id number] DOB: November 05, 1940 Today's Date: 06/22/2020    History of present illness Charles Schmidt is a 79 y.o. male presenting with weakness in the setting of vomiting and diarrhea. PMH is significant for metastatic carcinoid tumor, BPH s/p laser vaporization, and HTN.  On precautions for Cdiff.   OT comments  Pt with lethargy. Moaning, but does not appear to have pain. Daughter at bedside, educated in positioning and importance of changing position in bed for skin integrity. Pt was alert this morning at breakfast time, daughter fed him. Advised daughter that nursing team can also get pt OOB when he has another period of alertness.  Follow Up Recommendations  SNF    Equipment Recommendations  None recommended by OT    Recommendations for Other Services      Precautions / Restrictions Precautions Precautions: Fall       Mobility Bed Mobility               General bed mobility comments: instructed daughter in importance of floating heels and turning pt every 2 hours  Transfers                 General transfer comment: pt too lethargic to participate in transfer    Balance Overall balance assessment: Needs assistance                                         ADL either performed or assessed with clinical judgement   ADL                                               Vision       Perception     Praxis      Cognition Arousal/Alertness: Lethargic Behavior During Therapy: Flat affect                                   General Comments: pt only moaning         Exercises     Shoulder Instructions       General Comments      Pertinent Vitals/ Pain       Pain Assessment: Faces Faces Pain Scale: No hurt  Home Living                                          Prior Functioning/Environment               Frequency  Min 2X/week        Progress Toward Goals  OT Goals(current goals can now be found in the care plan section)  Progress towards OT goals: Not progressing toward goals - comment (pt lethargic today)  Acute Rehab OT Goals OT Goal Formulation: With family Time For Goal Achievement: 06/22/20  Plan Discharge plan remains appropriate    Co-evaluation                 AM-PAC OT "6 Clicks" Daily Activity     Outcome Measure   Help from another person eating meals?: Total Help from  another person taking care of personal grooming?: Total Help from another person toileting, which includes using toliet, bedpan, or urinal?: Total Help from another person bathing (including washing, rinsing, drying)?: Total Help from another person to put on and taking off regular upper body clothing?: Total Help from another person to put on and taking off regular lower body clothing?: Total 6 Click Score: 6    End of Session    OT Visit Diagnosis: Unsteadiness on feet (R26.81);Muscle weakness (generalized) (M62.81);Other abnormalities of gait and mobility (R26.89)   Activity Tolerance Patient limited by lethargy   Patient Left in bed;with call bell/phone within reach;with family/visitor present;with nursing/sitter in room   Nurse Communication          Time: 6629-4765 OT Time Calculation (min): 13 min  Charges: OT General Charges $OT Visit: 1 Visit OT Treatments $Therapeutic Activity: 8-22 mins  Charles Schmidt, OTR/L Acute Rehabilitation Services Pager: [phone] Office: [phone]   Charles Schmidt 06/22/2020, 12:22 PM

## 2020-06-22 NOTE — Plan of Care (Signed)
  Problem: Activity: Goal: Risk for activity intolerance will decrease Outcome: Progressing Repositioned every 2 hours   Problem: Nutrition: Goal: Adequate nutrition will be maintained Outcome: Not Progressing Daughters fed patient. Ate 25 % of breakfast. Lunch was pocketing food.

## 2020-06-22 NOTE — Progress Notes (Signed)
Family Medicine Teaching Service Daily Progress Note Intern Pager: [phone]  Patient name: Charles Schmidt Medical record number: [id number] Date of birth: [birth year] Age: 79 y.o. Gender: male  Primary Care Provider: Nolene Ebbs, MD Consultants: Oncology, palliative care, urology, nephrology   Code Status: DNR  Pt Overview and Major Events to Date:  9/14:Admitted for weakness, vomiting, diarrhea. Oncology consulted 9/15: Extreme elevation in AST, GI and surgery consulted, CTA abd/pelvis obtained 9/16:Developed urinary retention and AKI, foley placed, urology and nephrology consulted 9/17: Worsening kidney function and hyperphosphatemia 9/24: Family meeting with palliative-decision for SNF placement  Assessment and Plan: Charles Schmidt is a 79 y.o. male who intially presented with generalized weakness, vomiting, and diarrhea in the setting of rhabdomyolysis and ARF, now with fever. PMHx significant for: metastatic carcinoid tumor, BPH s/p laser vaporization, and HTN.  Fever with tachycardia Initially spiked a fever of 100.5 F on 9/29 and again overnight to 101.6 F.  Patient was started on broad-spectrum antibiotics with vancomycin and cefepime.  No focal symptoms and no leukocytosis.  Now with blood cultures growing staph epidermidis susceptible to methicillin, suggesting line related infection.  Also with Foley catheter in place and with urine culture growing E. coli greater than 100,000 colonies, susceptibilities pending.  CXR with evidence of possible bilateral pneumonia.  Will de-escalate antibiotics given culture findings.  - dc vancomycin and cefepime - IV cefazolin  ARF UOP 0.5 ml/kg/hr in the last 24 hours, some improvement. Cr 2.61, improving. Still with low bicarb.  3+ pitting edema on exam this morning. - Foley in place - strict I/O - monitor kidney function every other day - continue bicarb - compression stockings  Metastatic carcinoid tumor Stable.  Mesenteric mass  with innumerable hepatic metastases.  Receiving monthly octreotide. - oncology following, appreciate recommendations  HTN Normotensive. - continue amlodipine 5 mg daily  Hiccups No longer having hiccups. Can consider baclofen 5 mg if having hiccups again.  Goals of Care Patient with metastatic carcinoid tumor and worsening kidney function. Current weakness with limited PO intake, family in talks with SW about disposition location.  We will continue goals of care discussion in light of treatment of infection despite desire for comfort measures/hospice. -Disposition [facility]  FEN/GI: renal diet PPx: enoxaparin (renally dosed)  Disposition: med-surg, plan for hospice ([place])  Subjective:  Patient seen this morning with family.  They report his mental status has significantly improved, and has been able to verbally express his needs.  Still have some diarrhea, but is improved.  Objective: Temp:  [98.4 F (36.9 C)-101.6 F (38.7 C)] 99 F (37.2 C) (10/01 0419) Pulse Rate:  [102-110] 104 (10/01 0419) Resp:  [14-19] 16 (10/01 0419) BP: (130-144)/(80-90) 130/80 (10/01 0419) SpO2:  [93 %-98 %] 98 % (10/01 0419) Weight:  [101.2 kg] 101.2 kg (10/01 0007) Physical Exam: General: Elderly male resting comfortably in bed, NAD Cardiovascular: Tachycardic, regular rhythm, no murmurs Respiratory: CTAB, no wheezes or rales Abdomen: soft, distended, non-tender, +BS Extremities: WWP, 3+ pitting edema bilaterally Neuro: somnolent but arousable and conversant, cooperative, responds appropriately  Laboratory: Recent Labs  Lab 06/20/20 1117  WBC 7.1  HGB 11.2*  HCT 33.1*  PLT 171   Recent Labs  Lab 06/17/20 1305 06/19/20 0359 06/20/20 1117  NA 139 141 142  K 3.8 3.5 3.4*  CL 111 109 112*  CO2 18* 20* 19*  BUN 68* 51* 48*  CREATININE 3.84* 3.15* 2.87*  CALCIUM 7.8* 7.9* 7.9*  PROT  --   --  5.8*  BILITOT  --   --  1.1  ALKPHOS  --   --  253*  ALT  --   --  20  AST   --   --  32  GLUCOSE 132* 118* 145*   Results for orders placed or performed during the hospital encounter of 06/05/20  SARS Coronavirus 2 by RT PCR (hospital order, performed in [facility] hospital lab) Nasopharyngeal Nasopharyngeal Swab     Status: None   Collection Time: 06/05/20  6:28 PM   Specimen: Nasopharyngeal Swab  Result Value Ref Range Status   SARS Coronavirus 2 NEGATIVE NEGATIVE Final    Comment: (NOTE) SARS-CoV-2 target nucleic acids are NOT DETECTED.  The SARS-CoV-2 RNA is generally detectable in upper and lower respiratory specimens during the acute phase of infection. The lowest concentration of SARS-CoV-2 viral copies this assay can detect is 250 copies / mL. A negative result does not preclude SARS-CoV-2 infection and should not be used as the sole basis for treatment or other patient management decisions.  A negative result may occur with improper specimen collection / handling, submission of specimen other than nasopharyngeal swab, presence of viral mutation(s) within the areas targeted by this assay, and inadequate number of viral copies (<250 copies / mL). A negative result must be combined with clinical observations, patient history, and epidemiological information.  Fact Sheet for Patients:   StrictlyIdeas.no  Fact Sheet for Healthcare Providers: BankingDealers.co.za  This test is not yet approved or  cleared by the [place] FDA and has been authorized for detection and/or diagnosis of SARS-CoV-2 by FDA under an Emergency Use Authorization (EUA).  This EUA will remain in effect (meaning this test can be used) for the duration of the COVID-19 declaration under Section 564(b)(1) of the Act, 21 U.S.C. section 360bbb-3(b)(1), unless the authorization is terminated or revoked sooner.  Performed at [facility], [place] [address], [place], [place] [address]   Gastrointestinal Panel by PCR , Stool      Status: None   Collection Time: 06/06/20  9:55 AM   Specimen: STOOL  Result Value Ref Range Status   Campylobacter species NOT DETECTED NOT DETECTED Final   Plesimonas shigelloides NOT DETECTED NOT DETECTED Final   Salmonella species NOT DETECTED NOT DETECTED Final   Yersinia enterocolitica NOT DETECTED NOT DETECTED Final   Vibrio species NOT DETECTED NOT DETECTED Final   Vibrio cholerae NOT DETECTED NOT DETECTED Final   Enteroaggregative E coli (EAEC) NOT DETECTED NOT DETECTED Final   Enteropathogenic E coli (EPEC) NOT DETECTED NOT DETECTED Final   Enterotoxigenic E coli (ETEC) NOT DETECTED NOT DETECTED Final   Shiga like toxin producing E coli (STEC) NOT DETECTED NOT DETECTED Final   Shigella/Enteroinvasive E coli (EIEC) NOT DETECTED NOT DETECTED Final   Cryptosporidium NOT DETECTED NOT DETECTED Final   Cyclospora cayetanensis NOT DETECTED NOT DETECTED Final   Entamoeba histolytica NOT DETECTED NOT DETECTED Final   Giardia lamblia NOT DETECTED NOT DETECTED Final   Adenovirus F40/41 NOT DETECTED NOT DETECTED Final   Astrovirus NOT DETECTED NOT DETECTED Final   Norovirus GI/GII NOT DETECTED NOT DETECTED Final   Rotavirus A NOT DETECTED NOT DETECTED Final   Sapovirus (I, II, IV, and V) NOT DETECTED NOT DETECTED Final    Comment: Performed at [facility], [place] [place], [place] [address]  Respiratory Panel by RT PCR (Flu A&B, Covid) - Nasopharyngeal Swab     Status: None   Collection Time: 06/18/20  2:04  PM   Specimen: Nasopharyngeal Swab  Result Value Ref Range Status   SARS Coronavirus 2 by RT PCR NEGATIVE NEGATIVE Final    Comment: (NOTE) SARS-CoV-2 target nucleic acids are NOT DETECTED.  The SARS-CoV-2 RNA is generally detectable in upper respiratoy specimens during the acute phase of infection. The lowest concentration of SARS-CoV-2 viral copies this assay can detect is 131 copies/mL. A negative result does not preclude SARS-Cov-2 infection and should not  be used as the sole basis for treatment or other patient management decisions. A negative result may occur with  improper specimen collection/handling, submission of specimen other than nasopharyngeal swab, presence of viral mutation(s) within the areas targeted by this assay, and inadequate number of viral copies (<131 copies/mL). A negative result must be combined with clinical observations, patient history, and epidemiological information. The expected result is Negative.  Fact Sheet for Patients:  PinkCheek.be  Fact Sheet for Healthcare Providers:  GravelBags.it  This test is no t yet approved or cleared by the [place] FDA and  has been authorized for detection and/or diagnosis of SARS-CoV-2 by FDA under an Emergency Use Authorization (EUA). This EUA will remain  in effect (meaning this test can be used) for the duration of the COVID-19 declaration under Section 564(b)(1) of the Act, 21 U.S.C. section 360bbb-3(b)(1), unless the authorization is terminated or revoked sooner.     Influenza A by PCR NEGATIVE NEGATIVE Final   Influenza B by PCR NEGATIVE NEGATIVE Final    Comment: (NOTE) The Xpert Xpress SARS-CoV-2/FLU/RSV assay is intended as an aid in  the diagnosis of influenza from Nasopharyngeal swab specimens and  should not be used as a sole basis for treatment. Nasal washings and  aspirates are unacceptable for Xpert Xpress SARS-CoV-2/FLU/RSV  testing.  Fact Sheet for Patients: PinkCheek.be  Fact Sheet for Healthcare Providers: GravelBags.it  This test is not yet approved or cleared by the [place] FDA and  has been authorized for detection and/or diagnosis of SARS-CoV-2 by  FDA under an Emergency Use Authorization (EUA). This EUA will remain  in effect (meaning this test can be used) for the duration of the  Covid-19 declaration under Section  564(b)(1) of the Act, 21  U.S.C. section 360bbb-3(b)(1), unless the authorization is  terminated or revoked. Performed at [facility], [place] [address], [place], [place] [address]   Culture, blood (routine x 2)     Status: None (Preliminary result)   Collection Time: 06/21/20 11:36 AM   Specimen: BLOOD RIGHT HAND  Result Value Ref Range Status   Specimen Description BLOOD RIGHT HAND  Final   Special Requests   Final    BOTTLES DRAWN AEROBIC ONLY Blood Culture adequate volume   Culture  Setup Time   Final    GRAM POSITIVE COCCI IN CLUSTERS AEROBIC BOTTLE ONLY Organism ID to follow CRITICAL RESULT CALLED TO, READ BACK BY AND VERIFIED WITH: A WOLFE PHARMD [id number] 06/22/20 A BROWNING Performed at [facility], [place] [address], [place],  [address]    Culture PENDING  Incomplete   Report Status PENDING  Incomplete  Blood Culture ID Panel (Reflexed)     Status: Abnormal   Collection Time: 06/21/20 11:36 AM  Result Value Ref Range Status   Enterococcus faecalis NOT DETECTED NOT DETECTED Final   Enterococcus Faecium NOT DETECTED NOT DETECTED Final   Listeria monocytogenes NOT DETECTED NOT DETECTED Final   Staphylococcus species DETECTED (A) NOT DETECTED Final    Comment: CRITICAL RESULT  CALLED TO, READ BACK BY AND VERIFIED WITH: A WOLFE PHARMD 1509 06/22/20 A BROWNING    Staphylococcus aureus (BCID) NOT DETECTED NOT DETECTED Final   Staphylococcus epidermidis DETECTED (A) NOT DETECTED Final    Comment: CRITICAL RESULT CALLED TO, READ BACK BY AND VERIFIED WITH: A WOLFE PHARMD 1509 06/22/20 A BROWNING    Staphylococcus lugdunensis NOT DETECTED NOT DETECTED Final   Streptococcus species NOT DETECTED NOT DETECTED Final   Streptococcus agalactiae NOT DETECTED NOT DETECTED Final   Streptococcus pneumoniae NOT DETECTED NOT DETECTED Final   Streptococcus pyogenes NOT DETECTED NOT DETECTED Final   A.calcoaceticus-baumannii NOT DETECTED NOT DETECTED Final   Bacteroides fragilis NOT  DETECTED NOT DETECTED Final   Enterobacterales NOT DETECTED NOT DETECTED Final   Enterobacter cloacae complex NOT DETECTED NOT DETECTED Final   Escherichia coli NOT DETECTED NOT DETECTED Final   Klebsiella aerogenes NOT DETECTED NOT DETECTED Final   Klebsiella oxytoca NOT DETECTED NOT DETECTED Final   Klebsiella pneumoniae NOT DETECTED NOT DETECTED Final   Proteus species NOT DETECTED NOT DETECTED Final   Salmonella species NOT DETECTED NOT DETECTED Final   Serratia marcescens NOT DETECTED NOT DETECTED Final   Haemophilus influenzae NOT DETECTED NOT DETECTED Final   Neisseria meningitidis NOT DETECTED NOT DETECTED Final   Pseudomonas aeruginosa NOT DETECTED NOT DETECTED Final   Stenotrophomonas maltophilia NOT DETECTED NOT DETECTED Final   Candida albicans NOT DETECTED NOT DETECTED Final   Candida auris NOT DETECTED NOT DETECTED Final   Candida glabrata NOT DETECTED NOT DETECTED Final   Candida krusei NOT DETECTED NOT DETECTED Final   Candida parapsilosis NOT DETECTED NOT DETECTED Final   Candida tropicalis NOT DETECTED NOT DETECTED Final   Cryptococcus neoformans/gattii NOT DETECTED NOT DETECTED Final   Methicillin resistance mecA/C NOT DETECTED NOT DETECTED Final    Comment: Performed at [facility], [address], [place], [place] [address]  Culture, blood (routine x 2)     Status: None (Preliminary result)   Collection Time: 06/21/20 11:42 AM   Specimen: BLOOD LEFT HAND  Result Value Ref Range Status   Specimen Description BLOOD LEFT HAND  Final   Special Requests   Final    BOTTLES DRAWN AEROBIC ONLY Blood Culture adequate volume   Culture  Setup Time   Final    GRAM POSITIVE COCCI IN CLUSTERS AEROBIC BOTTLE ONLY IDENTIFICATION TO FOLLOW CRITICAL RESULT CALLED TO, READ BACK BY AND VERIFIED WITH: A WOLFE IPJASN [id number] 06/22/20 A BROWNING Performed at [facility], [place] [address] [place], [place] [address]    Culture PENDING  Incomplete   Report Status PENDING   Incomplete  Culture, Urine     Status: Abnormal (Preliminary result)   Collection Time: 06/21/20 12:31 PM   Specimen: Urine, Random  Result Value Ref Range Status   Specimen Description URINE, RANDOM  Final   Special Requests NONE  Final   Culture (A)  Final    >=100,000 COLONIES/mL ESCHERICHIA COLI SUSCEPTIBILITIES TO FOLLOW Performed at [facility], [address], [place], [place] [address]    Report Status PENDING  Incomplete     Imaging/Diagnostic Tests: No new imaging.  Zola Button, MD 06/22/2020, 7:51 AM PGY-1, [place] Intern pager: [phone], text pages welcome

## 2020-06-22 NOTE — Progress Notes (Signed)
PHARMACY - PHYSICIAN COMMUNICATION CRITICAL VALUE ALERT - BLOOD CULTURE IDENTIFICATION (BCID)  Charles Schmidt is an 79 y.o. male who presented to [facility] on 06/05/2020 with a chief complaint of persistent vomiting. Now growing MSSE in 1 bottle of each blood culture set drawn 9/30. He had multiple peripheral lines removed yesterday that had been in place for ~15 days which could represent true line infection given patient's recent fever and tachycardia. No obvious open sores.  Would recommend treating as line infection for an additional 5 days given lines have been removed.  Assessment:  BCx 2 bottles (1 in each set) growing MSSE  Name of physician (or Provider) Contacted: Donia Guiles  Current antibiotics: Vancomycin and cefepime  Changes to prescribed antibiotics recommended:  Discontinue vancomycin and cefepime Initiate cefazolin for an additional 5 days   Results for orders placed or performed during the hospital encounter of 06/05/20  Blood Culture ID Panel (Reflexed) (Collected: 06/21/2020 11:36 AM)  Result Value Ref Range   Enterococcus faecalis NOT DETECTED NOT DETECTED   Enterococcus Faecium NOT DETECTED NOT DETECTED   Listeria monocytogenes NOT DETECTED NOT DETECTED   Staphylococcus species DETECTED (A) NOT DETECTED   Staphylococcus aureus (BCID) NOT DETECTED NOT DETECTED   Staphylococcus epidermidis DETECTED (A) NOT DETECTED   Staphylococcus lugdunensis NOT DETECTED NOT DETECTED   Streptococcus species NOT DETECTED NOT DETECTED   Streptococcus agalactiae NOT DETECTED NOT DETECTED   Streptococcus pneumoniae NOT DETECTED NOT DETECTED   Streptococcus pyogenes NOT DETECTED NOT DETECTED   A.calcoaceticus-baumannii NOT DETECTED NOT DETECTED   Bacteroides fragilis NOT DETECTED NOT DETECTED   Enterobacterales NOT DETECTED NOT DETECTED   Enterobacter cloacae complex NOT DETECTED NOT DETECTED   Escherichia coli NOT DETECTED NOT DETECTED   Klebsiella aerogenes NOT DETECTED NOT  DETECTED   Klebsiella oxytoca NOT DETECTED NOT DETECTED   Klebsiella pneumoniae NOT DETECTED NOT DETECTED   Proteus species NOT DETECTED NOT DETECTED   Salmonella species NOT DETECTED NOT DETECTED   Serratia marcescens NOT DETECTED NOT DETECTED   Haemophilus influenzae NOT DETECTED NOT DETECTED   Neisseria meningitidis NOT DETECTED NOT DETECTED   Pseudomonas aeruginosa NOT DETECTED NOT DETECTED   Stenotrophomonas maltophilia NOT DETECTED NOT DETECTED   Candida albicans NOT DETECTED NOT DETECTED   Candida auris NOT DETECTED NOT DETECTED   Candida glabrata NOT DETECTED NOT DETECTED   Candida krusei NOT DETECTED NOT DETECTED   Candida parapsilosis NOT DETECTED NOT DETECTED   Candida tropicalis NOT DETECTED NOT DETECTED   Cryptococcus neoformans/gattii NOT DETECTED NOT DETECTED   Methicillin resistance mecA/C NOT DETECTED NOT DETECTED   Alfonse Spruce, PharmD PGY2 ID Pharmacy Resident [phone]  06/22/2020  3:21 PM

## 2020-06-22 NOTE — Progress Notes (Addendum)
IP PROGRESS NOTE   Subjective:  Has been more alert.  Continues to have fevers.  Has been started on IV antibiotics.  Blood cultures positive for gram-positive cocci in clusters in the aerobic bottle only in both sets of blood cultures.  Urine culture with greater than 100,000 E. coli.  Still with liquid stool.  Octreotide was increased to 150 mg every 6 hours on 9/30.  As needed Lomotil has been added.  Objective: Vital signs in last 24 hours: Blood pressure 126/77, pulse 98, temperature 98.1 F (36.7 C), temperature source Oral, resp. rate 20, height 6' 4"  (1.93 m), weight 101.2 kg, SpO2 96 %.  Intake/Output from previous day: 09/30 0701 - 10/01 0700 In: 560 [P.O.:460; IV Piggyback:100] Out: 1375 [Urine:1325; Stool:50]  Physical Exam:  Abdomen: Marked hepatomegaly extending into the left abdomen, tender in the mid and left abdomen Extremities: No edema Neurologic: Alert, moves all extremities to command  Lab Results: Recent Labs    06/20/20 1117 06/22/20 1021  WBC 7.1 10.1  HGB 11.2* 10.3*  HCT 33.1* 31.1*  PLT 171 121*    BMET Recent Labs    06/20/20 1117 06/22/20 1021  NA 142 142  K 3.4* 3.6  CL 112* 112*  CO2 19* 17*  GLUCOSE 145* 149*  BUN 48* 47*  CREATININE 2.87* 2.61*  CALCIUM 7.9* 7.8*    No results found for: CEA1  Studies/Results: DG Abd 1 View  Result Date: 06/21/2020 CLINICAL DATA:  Abdominal distension in fever EXAM: ABDOMEN - 1 VIEW COMPARISON:  Abdominal radiograph June 07, 2020; CT abdomen and pelvis June 06, 2020 FINDINGS: Relative opacity in the upper abdomen is stable, consistent with liver enlargement. There may be underlying ascites is well. There is no bowel dilatation or air-fluid level to suggest bowel obstruction. No appreciable free air evident. IMPRESSION: No findings indicative of overt bowel obstruction or free air. Suspect liver enlargement with potential ascites given opacity in the upper abdomen. This appearance is  similar to prior studies. Electronically Signed   By: Lowella Grip III M.D.   On: 06/21/2020 11:40   DG CHEST PORT 1 VIEW  Result Date: 06/21/2020 CLINICAL DATA:  Fever EXAM: PORTABLE CHEST 1 VIEW COMPARISON:  June 08, 2020 FINDINGS: There are apparent pleural effusions bilaterally with areas of atelectasis and suspected consolidation in both lower lobes. Heart is enlarged with pulmonary vascularity normal. No adenopathy. No bone lesions. IMPRESSION: Airspace opacity in both mid and lower lung regions with underlying pleural effusions, similar to most recent study. Stable cardiac prominence. Suspect bilateral pneumonia, although a degree of pulmonary edema may also be present. Electronically Signed   By: Lowella Grip III M.D.   On: 06/21/2020 11:38   CT images reviewed  Medications: I have reviewed the patient's current medications.  Assessment/Plan: 1.Metastatic carcinoid tumor, biopsy of a liver lesion 10/23/2017 consistent with a well-differentiated neuroendocrine neoplasm, WHO grade 2,[id number]%  CT abdomen/pelvis 10/13/2017-extensive liver metastases, cirrhosis, soft tissue mass in the small bowel mesentery versus a primary small bowel tumor, tiny lucent lesions in the pelvic bones  Elevated chromogranin A and 24-hour urine 5-HIAA  CT chest 11/24/2017-subpleural nodule in the left lower lobe with associated radiotracer activity on comparison DOTATATEPET scan. No additional evidence of thoracic metastasis.  DOTATATE PET scan3/01/2018-intense radiotracer accumulation with innumerable confluent hepatic metastasis; intense radiotracer activity within central mesenteric mass; 2 foci of uptake associated with the small bowel; intense radiotracer activity associated periaortic and paraspinal lymph nodes; more distant solitary small metastasis within  the pleural space left lower lobe.  Monthly Sandostatin initiated 11/26/2017, dose increased to 30 mg 01/21/2018  Cycle 1 Lutathera  03/03/2018, monthly Sandostatin continued  Cycle 2 Lutathera 04/27/2018, monthly Sandostatin continued  Cycle 3 Lutathera 06/23/2018  Cycle 4 Lutathera 08/18/2018  Restaging dotatate PET scan 09/29/2018-multiple lesions again demonstrated within the liver which accumulate the radiotracer. The number of lesions which accumulate the tracer has decreased visually compared to the prior exam. Some lesions are no longer evident along the right hepatic margin. Remaining measurable lesions have radiotracer activity similar to prior. No new lesions present. Central mesenteric mass unchanged and remains avid for radiotracer. Adjacent lesion within the small bowel SUV max equal 10.5 compared to SUV max equal 15.8. Smaller lesion previously identified in the upper pelvis small bowel appears decreased in activity. No new metastatic lesions. Lesion position between the right psoas muscle and spine SUV max equal to 21.8 compared with SUV max equal 26.2.  Dotatate PET 10/19/2019-no evidence of disease progression, stable multifocal hepatic metastases, unchanged from post therapy scan 03/30/2019 and improved from pretherapy scan5/01/2018. Stable mesenteric mass, improved retroperitoneal lymph node adjacent to the celiac trunk, solitary small bowel lesion unchanged  Monthly Sandostatin continued  01/26/2020 chromogranin A level stable elevation  CT abdomen/pelvis 06/05/2020-slight enlargement of central mesenteric mass, extensive hepatic metastases-unchanged, bowel wall thickening in central small bowel loops and right colon  2.Diarrhea-likely secondary to carcinoid syndrome, improved with Sandostatin 3.History of anorexia/weight loss-improved 4.Prostatic hypertrophy;status post laser vaporization 01/05/2018 5. Mild pancytopenia following Lutathera treatment 6.Thoracic dermatomal zoster rash 04/02/2019 treated with Valtrex 7.  Admission 06/05/2020 after a fall with failure to thrive, increased diarrhea,  nausea, and abdominal pain 8.  Markedly elevated AST and CPK on admission -likely rhabdomyolysis 9.  Acute renal failure-improved   [name] is more alert.  Currently with fevers with UTI and positive blood cultures.  Chest x-ray concerning for bilateral pneumonia.  He has worsening diarrhea today.  Octreotide was increased to 150 mg every 6 hours on 9/30.  He was started on as needed Lomotil and will change to scheduled dosing.  Hospital discharge is currently on hold while he undergoes treatment for his acute infection.  Recommendations: 1.  Continue octreotide 150 mcg every 6 hours. 2.  Lomotil changed to 1 tablet 4 times a day. 3.  Continue antibiotics per primary team.  Please call medical oncology as needed.    LOS: 17 days   Charles Bussing, NP   06/22/2020, 2:32 PM Mr. Shutes was interviewed and examined.  His daughter was at the bedside.  He is more alert and oriented today.  He is being treated with antibiotics for urinary tract infection and possible bacteremia.  He continues to have loose stool in the rectal tube.  We will add scheduled Lomotil.  I doubt he will benefit from a further increase in the octreotide dose.  I discussed the prognosis and disposition plans with his daughter.  She understands that regardless of his recovery from the acute infection he has a poor prognosis.  He remains a candidate for residential hospice.  Please call Oncology as needed over the weekend.  I will check on him 06/25/2020 if he remains in the hospital.  I will plan to follow him at [facility].

## 2020-06-23 LAB — URINE CULTURE: Culture: >=100000 — AB

## 2020-06-23 NOTE — Progress Notes (Signed)
Patient refused to Q2 turn. Daughter at bedside, prompted him all the time but patient refused. Patient educated on importance to turn side to side but he refused. Will continue to monitor patient.

## 2020-06-23 NOTE — Plan of Care (Signed)
  Problem: Clinical Measurements: Goal: Ability to maintain clinical measurements within normal limits will improve Outcome: Progressing Goal: Will remain free from infection Outcome: Progressing Goal: Diagnostic test results will improve Outcome: Progressing Goal: Respiratory complications will improve Outcome: Progressing Goal: Cardiovascular complication will be avoided Outcome: Progressing   Problem: Activity: Goal: Risk for activity intolerance will decrease Outcome: Progressing   Problem: Nutrition: Goal: Adequate nutrition will be maintained Outcome: Progressing   Problem: Coping: Goal: Level of anxiety will decrease Outcome: Progressing   Problem: Elimination: Goal: Will not experience complications related to bowel motility Outcome: Progressing Goal: Will not experience complications related to urinary retention Outcome: Progressing   Problem: Safety: Goal: Ability to remain free from injury will improve Outcome: Progressing   Problem: Skin Integrity: Goal: Risk for impaired skin integrity will decrease Outcome: Progressing

## 2020-06-23 NOTE — Progress Notes (Signed)
Family Medicine Teaching Service Daily Progress Note Intern Pager: [phone]  Patient name: Charles Schmidt Medical record number: [id number] Date of birth: [birth year] Age: 79 y.o. Gender: male  Primary Care Provider: Nolene Ebbs, MD Consultants: Oncology, palliative care, urology, nephrology Code Status: DNR  Pt Overview and Major Events to Date:  9/14:Admitted for weakness, vomiting, diarrhea. Oncology consulted 9/15: Extreme elevation in AST, GI and surgery consulted, CTA abd/pelvis obtained 9/16:Developed urinary retention and AKI, foley placed, urology and nephrology consulted 9/17: Worsening kidney function and hyperphosphatemia 9/24: Family meeting with palliative-decision for SNF placement 9/29: Fever of 100.5, 9/30: CXR concerning for b/l PNA, family opted for full scope tx, started on Vanc/Cefepime 10/1: Bld culture (+) staph epidermitis, Urine culture (+) E.coli, transitioned to Cefazolin  Assessment and Plan: Charles Schmidt is a 79 y.o. male who intially presented with generalized weakness, vomiting, and diarrhea in the setting of rhabdomyolysis and ARF, now with fever. PMHx significant for: metastatic carcinoid tumor, BPH s/p laser vaporization, and HTN.  Sepsis likely 2/2 staph epidermitis bacteremia  E.coli UTI Afebrile overnight. Last fever on at 2037 on 9/30.  Blood cultures positive for staph epidermidis in the aerobic bottle only in both sets of blood cultures. Urine culture growing E.Coli, susceptibilities pending. Broad spectrum ABx transitioned to IV Cefazolin on 10/1  CXR with evidence of possible bilateral pneumonia.   - S/p Vanc/Cefepime 9/30 - IV cefazolin (10/1-)  Goals of Care Patient with metastatic carcinoid tumor and worsening kidney function. Current weakness with limited PO intake, family in talks with SW about disposition location. Family meeting scheduled for today to further discuss ongoing treatment plan given that current treatment is unlikely to  alter patient's true trajectory.  - [facility]  Acute Renal Failure, improving UOP 800cc overnight. Cr down-trending and 2.61 on 10/1. Still with low bicarb.  1-2+ pitting edema on exam this morning with compression stockings in place. - Foley in place - strict I/O - monitor kidney function every other day - continue bicarb - compression stockings  Metastatic carcinoid tumor Likely progressing given decline in patient status.  Mesenteric mass with innumerable hepatic metastases.  Receiving monthly octreotide. - oncology following, appreciate recommendations  HTN Normotensive.  - continue amlodipine 5 mg daily  Hiccups, resolved - Can consider baclofen 5 mg if having hiccups again.  FEN/GI: renal diet PPx: enoxaparin (renally dosed)  Disposition: hospice pending family discussion  Subjective:  Patient is resting comfortably in bed. He is more alert this morning, answering questions appropriately and requesting things such as blankets. Denies any concerns or complaints. Explained to patient that family is going to meet today to further discuss ongoing plans but he did not   Objective: Temp:  [98.1 F (36.7 C)-98.8 F (37.1 C)] 98.5 F (36.9 C) (10/02 0447) Pulse Rate:  [91-98] 91 (10/02 0447) Resp:  [18-20] 20 (10/02 0447) BP: (126-134)/(77-85) 134/85 (10/02 0447) SpO2:  [96 %-99 %] 97 % (10/02 0447) Weight:  [299 kg] 103 kg (10/02 0209) Physical Exam: General: cachectic elderly gentleman, lying comfortably in exam bed, in no acute distress with non-toxic appearance CV: regular rate and rhythm without murmurs, rubs, or gallops, 1-2+ pitting edema in LE bilaterally with compression stockings in place, 2+ radial pulses bilaterally Resp: breathing comfortably on room air, speaking in full sentences Abdomen: soft, distended, normoactive bowel sounds Skin: warm, dry Extremities: warm and well perfused Neuro: Alert, speech mumbled  but normal, answers questions appropriately   Laboratory: Recent Labs  Lab 06/20/20  1117 06/22/20 1021  WBC 7.1 10.1  HGB 11.2* 10.3*  HCT 33.1* 31.1*  PLT 171 121*   Recent Labs  Lab 06/19/20 0359 06/20/20 1117 06/22/20 1021  NA 141 142 142  K 3.5 3.4* 3.6  CL 109 112* 112*  CO2 20* 19* 17*  BUN 51* 48* 47*  CREATININE 3.15* 2.87* 2.61*  CALCIUM 7.9* 7.9* 7.8*  PROT  --  5.8* 6.1*  BILITOT  --  1.1 1.1  ALKPHOS  --  253* 243*  ALT  --  20 22  AST  --  32 40  GLUCOSE 118* 145* 149*   Blood culture: 2/2 aerobic bottles staph epidermidis Urine Culture: E.coli  Imaging/Diagnostic Tests: No results found.   Mina Marble [place], DO 06/23/2020, 6:23 AM PGY-3, [place] Intern pager: [phone], text pages welcome

## 2020-06-23 NOTE — Progress Notes (Signed)
Family Medicine Teaching Service Daily Progress Note Intern Pager: [phone]  Patient name: Charles Schmidt Medical record number: [id number] Date of birth: Nov 26, 1940 Age: 79 y.o. Gender: male  Primary Care Provider: Nolene Ebbs, MD Consultants: Oncology, palliative care, urology, nephrology Code Status: DNR  Pt Overview and Major Events to Date:  9/14:Admitted for weakness, vomiting, diarrhea. Oncology consulted 9/15: Extreme elevation in AST, GI and surgery consulted, CTA abd/pelvis obtained 9/16:Developed urinary retention and AKI, foley placed, urology and nephrology consulted 9/17: Worsening kidney function and hyperphosphatemia 9/24: Family meeting with palliative-decision for SNF placement 9/29: Fever of 100.5, 9/30: CXR concerning for b/l PNA, family opted for full scope tx, started on Vanc/Cefepime 10/1: Bld culture (+) staph epidermitis, Urine culture (+) E.coli, transitioned to Cefazolin  Assessment and Plan: Charles Schmidt is a 79 y.o. male who intially presented with generalized weakness, vomiting, and diarrhea in the setting of rhabdomyolysis and ARF, now with E coli UTI and staph epidermitis bacteremia. PMHx significant for: metastatic carcinoid tumor, BPH s/p laser vaporization, and HTN.  Sepsis likely 2/2 staph epidermitis bacteremia  E.coli UTI Remained afebrile overnight.  Last fever on 9/30 at 2037.  On Cefazolin since 10/1.  Per ID, no Echo needed. E. Coli is pan-sensitive, Staph epi sensitivities pending. - S/p Vanc/Cefepime 9/30 - IV cefazolin (10/1-)  Goals of Care Patient with metastatic carcinoid tumor and worsening kidney function. Patient's family to have [place] meeting today to discuss plans for patient's care which will determine if antibiotics will continue. - [facility]  Acute Renal Failure, improving UOP 1125cc in last 24 hrs.  Cr continues to downtrend 2.13 from 2.61 this AM.  On exam trace BLE pitting  edema. - Foley in place - strict I/O - monitor kidney function every other day - continue bicarb - compression stockings  Metastatic carcinoid tumor Likely progressing given decline in patient status.  Mesenteric mass with innumerable hepatic metastases.  Receiving monthly octreotide. - oncology following, appreciate recommendations, last seen 10/1, plan to see again 10/4 - [place] discussion per above  HTN Remains in target range, last measure 142/79 at 2200 on 10/2..  - continue amlodipine 5 mg daily  Hiccups, resolved - Can consider baclofen 5 mg if having hiccups again.  FEN/GI: renal diet PPx: enoxaparin (renally dosed)  Disposition: hospice pending family discussion  Subjective:  Patient asking this AM if Harrel Lemon lift would help him and if he will be able to get a large hospital bed if he ultimately goes home.  He was happy to hear that his kidney function is improving and had no complaints this AM.  Objective: Temp:  [98 F (36.7 C)-98.6 F (37 C)] 98.6 F (37 C) (10/02 2255) Pulse Rate:  [90-92] 92 (10/02 2255) Resp:  [18] 18 (10/02 1504) BP: (133-142)/(79-86) 142/79 (10/02 2255) SpO2:  [97 %-100 %] 97 % (10/02 2255) Weight:  [104.8 kg] 104.8 kg (10/03 0548)  Physical Exam:  General: 79 y.o. male in NAD Cardio: RRR  Lungs: CTAB, no wheezing, no rhonchi, no crackles, no IWOB on RA Abdomen: Soft, non-tender to palpation, non-distended Skin: warm and dry Extremities: Trace BLE edema, compression stockings in place Neuro: alert, speech clear, answer questions apporpriately  Laboratory: Recent Labs  Lab 06/20/20 1117 06/22/20 1021  WBC 7.1 10.1  HGB 11.2* 10.3*  HCT 33.1* 31.1*  PLT 171 121*   Recent Labs  Lab 06/20/20 1117 06/22/20 1021 06/24/20 0413  NA 142 142 135  K 3.4* 3.6 3.6  CL  112* 112* 106  CO2 19* 17* 18*  BUN 48* 47* 45*  CREATININE 2.87* 2.61* 2.13*  CALCIUM 7.9* 7.8* 7.3*  PROT 5.8* 6.1*  --   BILITOT 1.1 1.1  --   ALKPHOS 253*  243*  --   ALT 20 22  --   AST 32 40  --   GLUCOSE 145* 149* 126*   Blood culture: 2/2 aerobic bottles staph epidermidis Urine Culture: E.coli  Imaging/Diagnostic Tests: No results found.   Lorita Forinash, Bernita Raisin, DO 06/24/2020, 5:50 AM PGY-3, [place] Intern pager: [phone], text pages welcome

## 2020-06-24 LAB — CULTURE, BLOOD (ROUTINE X 2)
Special Requests: ADEQUATE
Special Requests: ADEQUATE

## 2020-06-24 LAB — CBC
HCT: 27 % — ABNORMAL LOW (ref 39.0–52.0)
Hemoglobin: 9.2 g/dL — ABNORMAL LOW (ref 13.0–17.0)
MCH: 25.6 pg — ABNORMAL LOW (ref 26.0–34.0)
MCHC: 34.1 g/dL (ref 30.0–36.0)
MCV: 75 fL — ABNORMAL LOW (ref 80.0–100.0)
Platelets: 115 10*3/uL — ABNORMAL LOW (ref 150–400)
RBC: 3.6 MIL/uL — ABNORMAL LOW (ref 4.22–5.81)
RDW: 21.4 % — ABNORMAL HIGH (ref 11.5–15.5)
WBC: 4.8 10*3/uL (ref 4.0–10.5)
nRBC: 0 % (ref 0.0–0.2)

## 2020-06-24 LAB — BASIC METABOLIC PANEL
Anion gap: 11 (ref 5–15)
BUN: 45 mg/dL — ABNORMAL HIGH (ref 8–23)
CO2: 18 mmol/L — ABNORMAL LOW (ref 22–32)
Calcium: 7.3 mg/dL — ABNORMAL LOW (ref 8.9–10.3)
Chloride: 106 mmol/L (ref 98–111)
Creatinine, Ser: 2.13 mg/dL — ABNORMAL HIGH (ref 0.61–1.24)
GFR calc Af Amer: 33 mL/min — ABNORMAL LOW (ref >60)
GFR calc non Af Amer: 29 mL/min — ABNORMAL LOW (ref >60)
Glucose, Bld: 126 mg/dL — ABNORMAL HIGH (ref 70–99)
Potassium: 3.6 mmol/L (ref 3.5–5.1)
Sodium: 135 mmol/L (ref 135–145)

## 2020-06-24 MED ORDER — DIPHENOXYLATE-ATROPINE 2.5-0.025 MG PO TABS
1.0000 | ORAL_TABLET | Freq: Four times a day (QID) | ORAL | Status: DC | PRN
  Administered 2020-06-26 – 2020-06-28 (×4): 1 via ORAL
  Filled 2020-06-24 (×4): qty 1

## 2020-06-24 MED ORDER — CEFAZOLIN SODIUM-DEXTROSE 2-4 GM/100ML-% IV SOLN
2.0000 g | Freq: Three times a day (TID) | INTRAVENOUS | Status: AC
  Administered 2020-06-24 – 2020-06-27 (×10): 2 g via INTRAVENOUS
  Filled 2020-06-24 (×9): qty 100

## 2020-06-24 MED ORDER — ENOXAPARIN SODIUM 40 MG/0.4ML ~~LOC~~ SOLN
40.0000 mg | SUBCUTANEOUS | Status: DC
  Administered 2020-06-25 – 2020-07-04 (×11): 40 mg via SUBCUTANEOUS
  Filled 2020-06-24 (×11): qty 0.4

## 2020-06-25 LAB — BASIC METABOLIC PANEL
Anion gap: 11 (ref 5–15)
BUN: 41 mg/dL — ABNORMAL HIGH (ref 8–23)
CO2: 19 mmol/L — ABNORMAL LOW (ref 22–32)
Calcium: 7.5 mg/dL — ABNORMAL LOW (ref 8.9–10.3)
Chloride: 107 mmol/L (ref 98–111)
Creatinine, Ser: 2.21 mg/dL — ABNORMAL HIGH (ref 0.61–1.24)
GFR calc Af Amer: 32 mL/min — ABNORMAL LOW (ref >60)
GFR calc non Af Amer: 27 mL/min — ABNORMAL LOW (ref >60)
Glucose, Bld: 101 mg/dL — ABNORMAL HIGH (ref 70–99)
Potassium: 3.1 mmol/L — ABNORMAL LOW (ref 3.5–5.1)
Sodium: 137 mmol/L (ref 135–145)

## 2020-06-25 LAB — CBC
HCT: 26.1 % — ABNORMAL LOW (ref 39.0–52.0)
Hemoglobin: 8.9 g/dL — ABNORMAL LOW (ref 13.0–17.0)
MCH: 24.8 pg — ABNORMAL LOW (ref 26.0–34.0)
MCHC: 34.1 g/dL (ref 30.0–36.0)
MCV: 72.7 fL — ABNORMAL LOW (ref 80.0–100.0)
Platelets: 150 10*3/uL (ref 150–400)
RBC: 3.59 MIL/uL — ABNORMAL LOW (ref 4.22–5.81)
RDW: 20.2 % — ABNORMAL HIGH (ref 11.5–15.5)
WBC: 4.6 10*3/uL (ref 4.0–10.5)
nRBC: 0 % (ref 0.0–0.2)

## 2020-06-25 LAB — GLUCOSE, CAPILLARY: Glucose-Capillary: 138 mg/dL — ABNORMAL HIGH (ref 70–99)

## 2020-06-25 MED ORDER — POTASSIUM CHLORIDE CRYS ER 20 MEQ PO TBCR
40.0000 meq | EXTENDED_RELEASE_TABLET | Freq: Once | ORAL | Status: AC
  Administered 2020-06-25: 40 meq via ORAL
  Filled 2020-06-25: qty 2

## 2020-06-25 MED ORDER — SODIUM CHLORIDE 0.9 % IV SOLN
INTRAVENOUS | Status: DC | PRN
  Administered 2020-06-25 – 2020-06-26 (×2): 250 mL via INTRAVENOUS

## 2020-06-25 NOTE — Progress Notes (Signed)
Family Medicine Teaching Service Daily Progress Note Intern Pager: [phone]  Patient name: Charles Schmidt Medical record number: [id number] Date of birth: [birth year] Age: 79 y.o. Gender: male  Primary Care Provider: Nolene Ebbs, MD Consultants: Oncology, palliative care, urology, nephrology   Code Status: DNR  Pt Overview and Major Events to Date:  9/14:Admitted for weakness, vomiting, diarrhea. Oncology consulted 9/15: Extreme elevation in AST, GI and surgery consulted, CTA abd/pelvis obtained 9/16:Developed urinary retention and AKI, foley placed, urology and nephrology consulted 9/17: Worsening kidney function and hyperphosphatemia 9/24: Family meeting with palliative-decision for SNF placement 9/29: Fever of 100.5, 9/30: CXR concerning for b/l PNA, family opted for full scope tx, started on Vanc/Cefepime 10/1: Bld culture (+) staph epidermitis, Urine culture (+) E.coli, transitioned to Cefazolin  Assessment and Plan: Charles Schmidt is a 79 y.o. male intiallypresentedwith generalized weakness, vomiting, and diarrhea in the setting of rhabdomyolysis and ARF, now with E coli UTI and staph epidermitis bacteremia. PMHx significant QPR:[id number] carcinoid tumor, BPH s/p laser vaporization, and HTN. Medically stable for discharge.  Bacteremia  UTI Blood cultures growing pansensitive staph epidermidis and urine culture growing pansensitive E. coli.  Remains afebrile, last fever 9/30.  Continues on antibiotic treatment, plan for 5 days on cefazolin. - s/p vanc/cefepime (9/30) - IV cefazolin (10/1-)  Goals of care Patient with metastatic carcinoid tumor and worsening kidney function.  Spoke with patient, son-in-law Nicole Kindred, and daughter [name] this morning.  Family met yesterday and ultimately agreed to go to [facility], willing to accept bed without complete course of antibiotics but would like to continue antibiotics while still inpatient. - [facility]  Acute renal  failure Improving. Cr 2.21 today.  No significant edema on exam, compression stockings in place. - Foley in place - strict I/O - monitor kidney function every other day - continue bicarb - compression stockings  HTN Soft BP today 103/55. - hold amlodipine  FEN/GI: renal diet PPx: enoxaparin (renally dosed)  Disposition: hospice [facility])  Subjective:  No complaints today.  Denies pain anywhere.  Wishes to be discharged [facility].  Objective: Temp:  [98.2 F (36.8 C)-99.6 F (37.6 C)] 99.6 F (37.6 C) (10/04 0703) Pulse Rate:  [87-96] 87 (10/04 0703) Resp:  [17-20] 17 (10/04 0703) BP: (103-136)/(55-80) 103/55 (10/04 0703) SpO2:  [95 %-99 %] 95 % (10/04 0703) Physical Exam: General: Elderly male resting comfortably in bed, NAD Cardiovascular: RRR, no murmurs Respiratory: CTAB, no wheezes or rales Abdomen: soft, distended, non-tender, +BS Extremities: WWP, compression stockings in place, no significant edema Neuro: alert, conversant, oriented to self and place  Laboratory: Recent Labs  Lab 06/22/20 1021 06/24/20 0413 06/25/20 0348  WBC 10.1 4.8 4.6  HGB 10.3* 9.2* 8.9*  HCT 31.1* 27.0* 26.1*  PLT 121* 115* 150   Recent Labs  Lab 06/20/20 1117 06/20/20 1117 06/22/20 1021 06/24/20 0413 06/25/20 0348  NA 142   < > 142 135 137  K 3.4*   < > 3.6 3.6 3.1*  CL 112*   < > 112* 106 107  CO2 19*   < > 17* 18* 19*  BUN 48*   < > 47* 45* 41*  CREATININE 2.87*   < > 2.61* 2.13* 2.21*  CALCIUM 7.9*   < > 7.8* 7.3* 7.5*  PROT 5.8*  --  6.1*  --   --   BILITOT 1.1  --  1.1  --   --   ALKPHOS 253*  --  243*  --   --  ALT 20  --  22  --   --   AST 32  --  40  --   --   GLUCOSE 145*   < > 149* 126* 101*   < > = values in this interval not displayed.    Imaging/Diagnostic Tests: No new imaging.  Zola Button, MD 06/25/2020, 5:21 PM PGY-1, [place] Intern pager: [phone], text pages welcome

## 2020-06-25 NOTE — Progress Notes (Signed)
PT Cancellation Note  Patient Details Name: Conal Shetley MRN: [id number] DOB: 1941/04/03   Cancelled Treatment:     pt requesting therapist to return so he can rest, will attempt time permitting  Lyanne Co, DPT Acute Rehabilitation Services [phone]   [name] 06/25/2020, 12:57 PM

## 2020-06-25 NOTE — Progress Notes (Signed)
Manufacturing engineer [facility]) Hospital Liaison note.    Received request from [place] for renewed family interest in [facility]. [facility] is unable to offer a room today. Hospital Liaison will follow up tomorrow or sooner if a room becomes available.   Please do not hesitate to call with questions.   Thank you for the opportunity to participate in this patient's care. Chrislyn Edison Pace, BSN, RN [facility] Liaison (listed on AMION under Hospice/[facility])    [phone] (24h on call)

## 2020-06-25 NOTE — Progress Notes (Signed)
Family Medicine Teaching Service Attending Brief Progress Note  Patient seen and examined. Patient requesting to shower. He is alert and oriented to person, place, day of week, and situation ("I'm sick as a dog"). He is clearly able to state that he wishes to go to [facility] and not come back to the hospital. He understands the focus of care at [facility] is on comfort, not curing illness.  His son in law Nicole Kindred is at the bedside (daughter Tonya's husband). Nicole Kindred relays that the patient and family have decided on [facility].  I called and spoke with his daughter [name], who confirms this. She states patient was lucid yesterday with all 3 children present and told them all that he wants to go to [facility]. She reports that they are all 3 on board with this plan.  They do request that if possible, he be allowed to complete oral antibiotics at [facility] if a bed opens up prior to him completing his cefazolin here on 10/6, but they understand that this may not be possible. Extensively discussed that having another 1-2 days of antibiotics will not change his ultimate outcome. She understands, and still asks that we just check on it as it would make them all feel better. If [facility] is unable to accommodate giving antibiotics, they are still fine with him going there off antibiotics whenever a bed opens up.  Called other daughter Linwood Dibbles and confirmed she is on board with the same plan. She is agreeable and appreciative.  Called son [name] x2, left voicemail asking him to call the 3E unit. [name] has been more hesitant than his sisters about switching to a comfort care approach. I wanted to give him the opportunity to ask questions and share his thoughts. I am happy to speak with him if he calls the unit back. I can be paged at [phone].  Ultimately, the patient has clearly stated his decision to go to [facility], and it is his decision to make. Rochelle and Lavella Lemons are relieved to hear the patient state his wishes clearly,  as it takes the pressure off them to make a challenging decision.  I have reached out to CSW about a bed at [facility]. Patient can be discharged there whenever a bed is available. I am waiting on a response from CSW.  Chrisandra Netters, MD Attending Physician Family Medicine Teaching Service

## 2020-06-25 NOTE — Progress Notes (Addendum)
IP PROGRESS NOTE   Subjective:  More alert today.  No fevers.  Diarrhea overall well controlled with octreotide and as needed Lomotil.  Objective: Vital signs in last 24 hours: Blood pressure (!) 103/55, pulse 87, temperature 99.6 F (37.6 C), temperature source Oral, resp. rate 17, height 6' 4" (1.93 m), weight 104.8 kg, SpO2 95 %.  Intake/Output from previous day: 10/03 0701 - 10/04 0700 In: 1267.6 [P.O.:960; I.V.:7.6; IV Piggyback:300] Out: 1350 [Urine:1350]  Physical Exam:  Abdomen: Marked hepatomegaly extending into the left abdomen, tender in the mid and left abdomen Extremities: No edema Neurologic: Alert, moves all extremities to command  Lab Results: Recent Labs    06/24/20 0413 06/25/20 0348  WBC 4.8 4.6  HGB 9.2* 8.9*  HCT 27.0* 26.1*  PLT 115* 150    BMET Recent Labs    06/24/20 0413 06/25/20 0348  NA 135 137  K 3.6 3.1*  CL 106 107  CO2 18* 19*  GLUCOSE 126* 101*  BUN 45* 41*  CREATININE 2.13* 2.21*  CALCIUM 7.3* 7.5*    No results found for: CEA1  Studies/Results: No results found. CT images reviewed  Medications: I have reviewed the patient's current medications.  Assessment/Plan: 1.Metastatic carcinoid tumor, biopsy of a liver lesion 10/23/2017 consistent with a well-differentiated neuroendocrine neoplasm, WHO grade 2,ki-6710%  CT abdomen/pelvis 10/13/2017-extensive liver metastases, cirrhosis, soft tissue mass in the small bowel mesentery versus a primary small bowel tumor, tiny lucent lesions in the pelvic bones  Elevated chromogranin A and 24-hour urine 5-HIAA  CT chest 11/24/2017-subpleural nodule in the left lower lobe with associated radiotracer activity on comparison DOTATATEPET scan. No additional evidence of thoracic metastasis.  DOTATATE PET scan3/01/2018-intense radiotracer accumulation with innumerable confluent hepatic metastasis; intense radiotracer activity within central mesenteric mass; 2 foci of uptake associated with  the small bowel; intense radiotracer activity associated periaortic and paraspinal lymph nodes; more distant solitary small metastasis within the pleural space left lower lobe.  Monthly Sandostatin initiated 11/26/2017, dose increased to 30 mg 01/21/2018  Cycle 1 Lutathera 03/03/2018, monthly Sandostatin continued  Cycle 2 Lutathera 04/27/2018, monthly Sandostatin continued  Cycle 3 Lutathera 06/23/2018  Cycle 4 Lutathera 08/18/2018  Restaging dotatate PET scan 09/29/2018-multiple lesions again demonstrated within the liver which accumulate the radiotracer. The number of lesions which accumulate the tracer has decreased visually compared to the prior exam. Some lesions are no longer evident along the right hepatic margin. Remaining measurable lesions have radiotracer activity similar to prior. No new lesions present. Central mesenteric mass unchanged and remains avid for radiotracer. Adjacent lesion within the small bowel SUV max equal 10.5 compared to SUV max equal 15.8. Smaller lesion previously identified in the upper pelvis small bowel appears decreased in activity. No new metastatic lesions. Lesion position between the right psoas muscle and spine SUV max equal to 21.8 compared with SUV max equal 26.2.  Dotatate PET 10/19/2019-no evidence of disease progression, stable multifocal hepatic metastases, unchanged from post therapy scan 03/30/2019 and improved from pretherapy scan5/01/2018. Stable mesenteric mass, improved retroperitoneal lymph node adjacent to the celiac trunk, solitary small bowel lesion unchanged  Monthly Sandostatin continued  01/26/2020 chromogranin A level stable elevation  CT abdomen/pelvis 06/05/2020-slight enlargement of central mesenteric mass, extensive hepatic metastases-unchanged, bowel wall thickening in central small bowel loops and right colon  2.Diarrhea-likely secondary to carcinoid syndrome, improved with Sandostatin 3.History of anorexia/weight  loss-improved 4.Prostatic hypertrophy;status post laser vaporization 01/05/2018 5. Mild pancytopenia following Lutathera treatment 6.Thoracic dermatomal zoster rash 04/02/2019 treated with Valtrex 7.  Admission 06/05/2020 after a fall with failure to thrive, increased diarrhea, nausea, and abdominal pain 8.  Markedly elevated AST and CPK on admission -likely rhabdomyolysis 9.  Acute renal failure-improved   Mr. Angelo appears unchanged.  Fevers have resolved and he remains on antibiotics for UTI and bacteremia.  Diarrhea is overall well controlled with octreotide and as needed Lomotil.  Renal function remains overall stable.  The patient has expressed his wishes to the primary team and wishes to discharge to [facility].  We are agreeable with this plan.  Recommendations: 1.  Continue octreotide 150 mcg every 6 hours. 2.  Continue Lomotil as needed. 3.  Can transition to oral antibiotics prior to discharge. 4.  We will follow the patient at [facility] once he is discharged.    LOS: 20 days   [name], NP   06/25/2020, 1:30 PM Mr. Gahan was interviewed and examined.  He is alert and appears oriented this morning.  His clinical status has improved since beginning antibiotics for treatment of the urinary tract infection and bacteremia, but his overall prognosis remains poor.  He has a poor performance status.  He would like to go to [facility].  He confirmed this decision today.  His daughter [name] was present when I saw him early this morning.  No stool output has been recorded over the weekend, but he has loose stool in the rectal tube and back this morning.  I recommend continuing Lomotil as needed.  He will most likely not be able to continue octreotide at [facility].  I will plan to follow him at [facility].

## 2020-06-25 NOTE — Progress Notes (Signed)
PT Cancellation Note  Patient Details Name: Charles Schmidt MRN: [id number] DOB: 26-Sep-1940   Cancelled Treatment:     pt perseverating on using toilet and taking a shower; Pt refused to participate in therapy  Lyanne Co, DPT Acute Rehabilitation Services [phone]   [name] 06/25/2020, 12:58 PM

## 2020-06-26 DIAGNOSIS — R1115 Cyclical vomiting syndrome unrelated to migraine: Secondary | ICD-10-CM | POA: Diagnosis not present

## 2020-06-26 LAB — RENAL FUNCTION PANEL
Albumin: 1.4 g/dL — ABNORMAL LOW (ref 3.5–5.0)
Anion gap: 12 (ref 5–15)
BUN: 38 mg/dL — ABNORMAL HIGH (ref 8–23)
CO2: 20 mmol/L — ABNORMAL LOW (ref 22–32)
Calcium: 7.6 mg/dL — ABNORMAL LOW (ref 8.9–10.3)
Chloride: 106 mmol/L (ref 98–111)
Creatinine, Ser: 1.92 mg/dL — ABNORMAL HIGH (ref 0.61–1.24)
GFR calc Af Amer: 38 mL/min — ABNORMAL LOW (ref >60)
GFR calc non Af Amer: 32 mL/min — ABNORMAL LOW (ref >60)
Glucose, Bld: 114 mg/dL — ABNORMAL HIGH (ref 70–99)
Phosphorus: 3 mg/dL (ref 2.5–4.6)
Potassium: 2.8 mmol/L — ABNORMAL LOW (ref 3.5–5.1)
Sodium: 138 mmol/L (ref 135–145)

## 2020-06-26 MED ORDER — POTASSIUM CHLORIDE CRYS ER 20 MEQ PO TBCR
40.0000 meq | EXTENDED_RELEASE_TABLET | Freq: Two times a day (BID) | ORAL | Status: AC
  Administered 2020-06-26: 40 meq via ORAL

## 2020-06-26 MED ORDER — POTASSIUM CHLORIDE CRYS ER 20 MEQ PO TBCR
40.0000 meq | EXTENDED_RELEASE_TABLET | Freq: Once | ORAL | Status: AC
  Administered 2020-06-26: 40 meq via ORAL
  Filled 2020-06-26: qty 2

## 2020-06-26 MED ORDER — POTASSIUM CHLORIDE CRYS ER 20 MEQ PO TBCR: 40.0000 meq | EXTENDED_RELEASE_TABLET | Freq: Two times a day (BID) | ORAL | Status: DC

## 2020-06-26 MED ORDER — POTASSIUM CHLORIDE CRYS ER 20 MEQ PO TBCR
40.0000 meq | EXTENDED_RELEASE_TABLET | Freq: Two times a day (BID) | ORAL | Status: DC
  Filled 2020-06-26: qty 2

## 2020-06-26 NOTE — Progress Notes (Signed)
Physical Therapy Discharge Patient Details Name: Charles Schmidt MRN: [id number] DOB: April 17, 1941 Today's Date: 06/26/2020 Time: 2481-8590 PT Time Calculation (min) (ACUTE ONLY): 22 min  Patient discharged from PT services secondary to medical decline - will need to re-order PT to resume therapy services.  Please see latest therapy progress note for current level of functioning and progress toward goals.    Progress and discharge plan discussed with patient and/or caregiver: Patient/Caregiver agrees with plan  GP    Lyanne Co, DPT Acute Rehabilitation Services [phone]  Charles Schmidt 06/26/2020, 11:52 AM

## 2020-06-26 NOTE — Progress Notes (Signed)
CSW spoke with [name], son of pt, confirmed that [facility] is still the choice for pt. CSW spoke with RN Tai who confirmed antibiotics would end tomorrow. CSW called and spoke with Donne Hazel ([place]), will update CSW when bed is available.

## 2020-06-26 NOTE — Progress Notes (Signed)
Family Medicine Teaching Service Daily Progress Note Intern Pager: [phone]  Patient name: Charles Schmidt Medical record number: [id number] Date of birth: December 24, 1940 Age: 79 y.o. Gender: male  Primary Care Provider: Nolene Ebbs, MD Consultants: Oncology, palliative care, urology, nephrology   Code Status: DNR  Pt Overview and Major Events to Date:  9/14:Admitted for weakness, vomiting, diarrhea. Oncology consulted 9/15: Extreme elevation in AST, GI and surgery consulted, CTA abd/pelvis obtained 9/16:Developed urinary retention and AKI, foley placed, urology and nephrology consulted 9/17: Worsening kidney function and hyperphosphatemia 9/24: Family meeting with palliative-decision for SNF placement 9/29: Fever of 100.5, 9/30: CXR concerning for b/l PNA, family opted for full scope tx, started on Vanc/Cefepime 10/1: Bld culture (+) staph epidermitis, Urine culture (+) E.coli, transitioned to Cefazolin  Assessment and Plan: Charles Schmidt is a 79 y.o. male intiallypresentedwith generalized weakness, vomiting, and diarrhea in the setting of rhabdomyolysis and ARF, now withE coli UTI and staph epidermitis bacteremia. PMHx significant CVE:[id number] carcinoid tumor, BPH s/p laser vaporization, and HTN. Medically stable for discharge.  Bacteremia  UTI Blood cultures growing pansensitive staph epidermidis and urine culture growing pansensitive E. coli.  Remains afebrile, last fever 9/30.  Continues on antibiotic treatment, plan for 5 days on cefazolin. - s/p vanc/cefepime (9/30) - IV cefazolin (10/1-)  Goals of care Patient with metastatic carcinoid tumor and worsening kidney function.   Upon discussions with patient and family members, ultimately decided on hospice at [facility].  Family wanting to continue antibiotics while in the hospital and interested in p.o. antibiotics at [facility] if course not completed. - dispo [facility]  Acute renal failure Improving. Cr 1.92  today.  No significant edema on exam, compression stockings in place. - Foley in place - strict I/O - monitor kidney function every other day - continue bicarb - compression stockings  Hypokalemia K 2.8. Will replete.  HTN Normotensive.  Amlodipine held for soft BP. - holding amlodipine  FEN/GI: renal diet PPx: enoxaparin (renally dosed)  Disposition: hospice [facility])  Subjective:  Patient seen alone this morning with towel on his head, patient states he did not sleep well last night and is trying to sleep.  No concerns at this time.  Objective: Temp:  [98.3 F (36.8 C)-98.5 F (36.9 C)] 98.5 F (36.9 C) (10/05 0453) Pulse Rate:  [89-91] 89 (10/05 0453) BP: (126-128)/(71-72) 128/72 (10/05 0453) SpO2:  [92 %-94 %] 92 % (10/05 0453) Weight:  [99.3 kg] 99.3 kg (10/05 0453) Physical Exam: General:Elderly male resting comfortably in bed, NAD Cardiovascular:RRR, no murmurs Respiratory:CTAB, no wheezes or rales Abdomen:soft, non-tender, +BS Extremities:WWP, compression stockings in place, no significant edema Neuro: alert, conversant  Laboratory: Recent Labs  Lab 06/22/20 1021 06/24/20 0413 06/25/20 0348  WBC 10.1 4.8 4.6  HGB 10.3* 9.2* 8.9*  HCT 31.1* 27.0* 26.1*  PLT 121* 115* 150   Recent Labs  Lab 06/20/20 1117 06/20/20 1117 06/22/20 1021 06/22/20 1021 06/24/20 0413 06/25/20 0348 06/26/20 0556  NA 142   < > 142   < > 135 137 138  K 3.4*   < > 3.6   < > 3.6 3.1* 2.8*  CL 112*   < > 112*   < > 106 107 106  CO2 19*   < > 17*   < > 18* 19* 20*  BUN 48*   < > 47*   < > 45* 41* 38*  CREATININE 2.87*   < > 2.61*   < > 2.13* 2.21* 1.92*  CALCIUM 7.9*   < >  7.8*   < > 7.3* 7.5* 7.6*  PROT 5.8*  --  6.1*  --   --   --   --   BILITOT 1.1  --  1.1  --   --   --   --   ALKPHOS 253*  --  243*  --   --   --   --   ALT 20  --  22  --   --   --   --   AST 32  --  40  --   --   --   --   GLUCOSE 145*   < > 149*   < > 126* 101* 114*   < > = values in  this interval not displayed.     Imaging/Diagnostic Tests: No new imaging.  Zola Button, MD 06/26/2020, 7:19 AM PGY-1, [place] Intern pager: [phone], text pages welcome

## 2020-06-26 NOTE — Progress Notes (Signed)
Physical Therapy Treatment Patient Details Name: Charles Schmidt MRN: [id number] DOB: [birth year] Today's Date: 06/26/2020    History of Present Illness Charles Schmidt is a 79 y.o. male presenting with weakness in the setting of vomiting and diarrhea. PMH is significant for metastatic carcinoid tumor, BPH s/p laser vaporization, and HTN.  On precautions for Cdiff.    PT Comments    Pt and family have decided to transition to comfort care and discharge to [facility]. Pt and family have been educated on assisting with ROM, bed mobility, positioning and transfers to maintain comfort; family verbalizes understanding following additional education during session;  No further acute PT needs noted at this time, will sign off; if discharge plans change please reconsult if PT is needed   Follow Up Recommendations  Other (comment) (pt will discharge to [facility])     Equipment Recommendations       Recommendations for Other Services       Precautions / Restrictions      Mobility  Bed Mobility                  Transfers                    Ambulation/Gait                 Stairs             Wheelchair Mobility    Modified Rankin (Stroke Patients Only)       Balance                                            Cognition                                              Exercises      General Comments General comments (skin integrity, edema, etc.): son present in room; per pt and son pt will discharge to [facility] on comfort care; increased time spent educating son on positioning, bed mobility and ROM to improve comfort; education and demonstration on squat pivot transfer, son verbalized understanding, education not to stand pt up; son states he feels comfortable wtih assisting with supine<>sit and bed<>chair transfer if pt desires upon discharge at [facility].      Pertinent Vitals/Pain      Home  Living                      Prior Function            PT Goals (current goals can now be found in the care plan section) Acute Rehab PT Goals Patient Stated Goal: to go home PT Goal Formulation: With patient/family Time For Goal Achievement: 06/22/20 Potential to Achieve Goals: Poor Progress towards PT goals: Not progressing toward goals - comment (pt has transitioned to comfort care and will be discharging to [facility], family educated and understands how to assist with pt's need for comfort, no further acute PT needs noted)    Frequency           PT Plan Frequency needs to be updated    Co-evaluation              AM-PAC PT "6  Clicks" Mobility   Outcome Measure  Help needed turning from your back to your side while in a flat bed without using bedrails?: Total Help needed moving from lying on your back to sitting on the side of a flat bed without using bedrails?: Total Help needed moving to and from a bed to a chair (including a wheelchair)?: Total Help needed standing up from a chair using your arms (e.g., wheelchair or bedside chair)?: Total Help needed to walk in hospital room?: Total Help needed climbing 3-5 steps with a railing? : Total 6 Click Score: 6    End of Session   Activity Tolerance: Treatment limited secondary to medical complications (Comment) Patient left: with family/visitor present;with call bell/phone within reach;in bed Nurse Communication: Mobility status PT Visit Diagnosis: Muscle weakness (generalized) (M62.81);Unsteadiness on feet (R26.81)     Time:  -     Charges:                        Lyanne Co, DPT Acute Rehabilitation Services [phone]   [name] 06/26/2020, 11:48 AM

## 2020-06-26 NOTE — Progress Notes (Signed)
Nutrition Brief Note  Chart reviewed. Pt now transitioning to comfort care. Per chart review, plan to continue antibiotics until bed is available at [facility].  No further nutrition interventions warranted at this time.  Please re-consult as needed.   Loistine Chance, RD, LDN, [place] Registered Dietitian II Certified Diabetes Care and Education Specialist Please refer to [facility] for RD and/or RD on-call/weekend/after hours pager

## 2020-06-27 DIAGNOSIS — R1115 Cyclical vomiting syndrome unrelated to migraine: Secondary | ICD-10-CM | POA: Diagnosis not present

## 2020-06-27 LAB — RENAL FUNCTION PANEL
Albumin: 1.4 g/dL — ABNORMAL LOW (ref 3.5–5.0)
Anion gap: 11 (ref 5–15)
BUN: 32 mg/dL — ABNORMAL HIGH (ref 8–23)
CO2: 21 mmol/L — ABNORMAL LOW (ref 22–32)
Calcium: 7.6 mg/dL — ABNORMAL LOW (ref 8.9–10.3)
Chloride: 107 mmol/L (ref 98–111)
Creatinine, Ser: 1.86 mg/dL — ABNORMAL HIGH (ref 0.61–1.24)
GFR calc non Af Amer: 34 mL/min — ABNORMAL LOW (ref >60)
Glucose, Bld: 139 mg/dL — ABNORMAL HIGH (ref 70–99)
Phosphorus: 2.6 mg/dL (ref 2.5–4.6)
Potassium: 3.3 mmol/L — ABNORMAL LOW (ref 3.5–5.1)
Sodium: 139 mmol/L (ref 135–145)

## 2020-06-27 LAB — MAGNESIUM: Magnesium: 1.8 mg/dL (ref 1.7–2.4)

## 2020-06-27 MED ORDER — POTASSIUM CHLORIDE CRYS ER 20 MEQ PO TBCR
40.0000 meq | EXTENDED_RELEASE_TABLET | Freq: Once | ORAL | Status: AC
  Administered 2020-06-27: 40 meq via ORAL
  Filled 2020-06-27: qty 2

## 2020-06-27 NOTE — Progress Notes (Signed)
Family Medicine Teaching Service Daily Progress Note Intern Pager: [phone]  Patient name: Charles Schmidt Medical record number: [id number] Date of birth: 12-29-1940 Age: 79 y.o. Gender: male  Primary Care Provider: Nolene Ebbs, MD Consultants: Oncology, palliative care, urology, nephrology   Code Status: DNR  Pt Overview and Major Events to Date:  9/14:Admitted for weakness, vomiting, diarrhea. Oncology consulted 9/15: Extreme elevation in AST, GI and surgery consulted, CTA abd/pelvis obtained 9/16:Developed urinary retention and AKI, foley placed, urology and nephrology consulted 9/17: Worsening kidney function and hyperphosphatemia 9/24: Family meeting with palliative-decision for SNF placement 9/29: Fever of 100.5, 9/30: CXR concerning for b/l PNA, family opted for full scope tx, started on Vanc/Cefepime 10/1: Bld culture (+) staph epidermitis, Urine culture (+) E.coli, transitioned to Cefazolin  Assessment and Plan: Charles Schmidt is a 79 y.o. male intiallypresentedwith generalized weakness, vomiting, and diarrhea in the setting of rhabdomyolysis and ARF, now withE coli UTI and staph epidermitis bacteremia. PMHx significant UUV:[id number] carcinoid tumor, BPH s/p laser vaporization, and HTN. Medically stable for discharge.  Bacteremia  UTI Blood cultures growing pansensitive staph epidermidis and urine culture growing pansensitive E. coli.  Remains afebrile, last fever 9/30. Completes antibiotic therapy today. - s/p vanc/cefepime (9/30) - IV cefazolin (10/1-6)  Goals of care Patient with metastatic carcinoid tumor and worsening kidney function.   Upon discussions with patient and family members, ultimately decided on hospice at [facility] - dispo [facility]  Acute renal failure Improving. No significant edema on exam, compression stockings in place (will remove due to discomfort). - Foley in place - strict I/O - monitor kidney function every other day -  continue bicarb - d/c compression stockings  Hypokalemia K 2.8 yesterday, repleted. Awaiting labs.  HTN Normotensive.  Amlodipine held for soft BP. - holding amlodipine  FEN/GI: regular diet PPx: enoxaparin (renally dosed)  Disposition: hospice [facility])  Subjective:  Patient seen alone this morning.  Compression stockings causing a lot of pain, patient requesting to have been removed. No other concerns at this time. He has heard there may be a bed available at [facility] today.  Objective: Temp:  [97.9 F (36.6 C)-98.5 F (36.9 C)] 98.3 F (36.8 C) (10/06 0401) Pulse Rate:  [87-91] 89 (10/06 0401) Resp:  [8-17] 16 (10/06 0401) BP: (133-141)/(84-92) 133/84 (10/06 0401) SpO2:  [98 %-99 %] 98 % (10/06 0401) Weight:  [101.2 kg] 101.2 kg (10/06 0102) Physical Exam: General:Elderly male resting comfortably in bed, NAD Cardiovascular:RRR, no murmurs Respiratory:CTAB, no wheezes or rales Abdomen:soft, non-tender, +BS Extremities:WWP, compression stockings partially removed, no significant edema Neuro: alert, conversant  Laboratory: Recent Labs  Lab 06/22/20 1021 06/24/20 0413 06/25/20 0348  WBC 10.1 4.8 4.6  HGB 10.3* 9.2* 8.9*  HCT 31.1* 27.0* 26.1*  PLT 121* 115* 150   Recent Labs  Lab 06/20/20 1117 06/20/20 1117 06/22/20 1021 06/22/20 1021 06/24/20 0413 06/25/20 0348 06/26/20 0556  NA 142   < > 142   < > 135 137 138  K 3.4*   < > 3.6   < > 3.6 3.1* 2.8*  CL 112*   < > 112*   < > 106 107 106  CO2 19*   < > 17*   < > 18* 19* 20*  BUN 48*   < > 47*   < > 45* 41* 38*  CREATININE 2.87*   < > 2.61*   < > 2.13* 2.21* 1.92*  CALCIUM 7.9*   < > 7.8*   < > 7.3* 7.5*  7.6*  PROT 5.8*  --  6.1*  --   --   --   --   BILITOT 1.1  --  1.1  --   --   --   --   ALKPHOS 253*  --  243*  --   --   --   --   ALT 20  --  22  --   --   --   --   AST 32  --  40  --   --   --   --   GLUCOSE 145*   < > 149*   < > 126* 101* 114*   < > = values in this interval not  displayed.     Imaging/Diagnostic Tests: No new imaging.  Zola Button, MD 06/27/2020, 9:12 AM PGY-1, [place] Intern pager: [phone], text pages welcome

## 2020-06-27 NOTE — Progress Notes (Signed)
Manufacturing engineer [facility]) [facility]  There is not a bed to offer Mr. Harrower today at [facility].   [facility] will update family and TOC once a bed is available.  Venia Carbon RN, BSN, [facility] Liaison

## 2020-06-27 NOTE — Progress Notes (Signed)
CSW went by to see pt, pt alert, csw spoke with daughter Linwood Dibbles in reference to possibility of other residential hospice facilities outside of [place] ([facility]/[place], etc), [place] declined. No [facility] beds available.

## 2020-06-27 NOTE — Progress Notes (Signed)
OT Cancellation Note and Discharge  Patient Details Name: Charles Schmidt MRN: [id number] DOB: May 08, 1941   Cancelled Treatment:    Reason Eval/Treat Not Completed:  Family has decided on comfort care and d/c to [facility]. OT is signing off.  Malka So 06/27/2020, 8:36 AM  Nestor Lewandowsky, OTR/L Acute Rehabilitation Services Pager: [phone] Office: [phone]

## 2020-06-28 DIAGNOSIS — C7B8 Other secondary neuroendocrine tumors: Secondary | ICD-10-CM | POA: Diagnosis not present

## 2020-06-28 DIAGNOSIS — R1115 Cyclical vomiting syndrome unrelated to migraine: Secondary | ICD-10-CM | POA: Diagnosis not present

## 2020-06-28 MED ORDER — SPIRITUS FRUMENTI
1.0000 | Freq: Once | ORAL | Status: AC
  Administered 2020-06-28: 1 via ORAL
  Filled 2020-06-28: qty 1

## 2020-06-28 MED ORDER — DIPHENOXYLATE-ATROPINE 2.5-0.025 MG PO TABS
1.0000 | ORAL_TABLET | Freq: Four times a day (QID) | ORAL | Status: DC
  Administered 2020-06-28 – 2020-07-05 (×24): 1 via ORAL
  Filled 2020-06-28 (×25): qty 1

## 2020-06-28 NOTE — Progress Notes (Signed)
IP PROGRESS NOTE   Subjective:  [name] is alert.  He denies pain.  No diarrhea.  He would like to change Lomotil to scheduled dosing.  His son is at the bedside.  Objective: Vital signs in last 24 hours: Blood pressure 135/86, pulse 86, temperature 98.1 F (36.7 C), temperature source Oral, resp. rate 18, height 6' 4"  (1.93 m), weight 227 lb 3.2 oz (103.1 kg), SpO2 97 %.  Intake/Output from previous day: 10/06 0701 - 10/07 0700 In: 1200 [P.O.:1200] Out: 1250 [Urine:1250]  Physical Exam: HEENT: No thrush Abdomen: Marked hepatomegaly extending into the left abdomen, tender in the mid and left abdomen, ascites Extremities: No edema Neurologic: Alert and oriented  Lab Results: No results for input(s): WBC, HGB, HCT, PLT in the last 72 hours.  BMET Recent Labs    06/26/20 0556 06/27/20 0931  NA 138 139  K 2.8* 3.3*  CL 106 107  CO2 20* 21*  GLUCOSE 114* 139*  BUN 38* 32*  CREATININE 1.92* 1.86*  CALCIUM 7.6* 7.6*    No results found for: CEA1  Studies/Results: No results found. CT images reviewed  Medications: I have reviewed the patient's current medications.  Assessment/Plan: 1.Metastatic carcinoid tumor, biopsy of a liver lesion 10/23/2017 consistent with a well-differentiated neuroendocrine neoplasm, WHO grade 2,ki-6710%  CT abdomen/pelvis 10/13/2017-extensive liver metastases, cirrhosis, soft tissue mass in the small bowel mesentery versus a primary small bowel tumor, tiny lucent lesions in the pelvic bones  Elevated chromogranin A and 24-hour urine 5-HIAA  CT chest 11/24/2017-subpleural nodule in the left lower lobe with associated radiotracer activity on comparison DOTATATEPET scan. No additional evidence of thoracic metastasis.  DOTATATE PET scan3/01/2018-intense radiotracer accumulation with innumerable confluent hepatic metastasis; intense radiotracer activity within central mesenteric mass; 2 foci of uptake associated with the small bowel; intense  radiotracer activity associated periaortic and paraspinal lymph nodes; more distant solitary small metastasis within the pleural space left lower lobe.  Monthly Sandostatin initiated 11/26/2017, dose increased to 30 mg 01/21/2018  Cycle 1 Lutathera 03/03/2018, monthly Sandostatin continued  Cycle 2 Lutathera 04/27/2018, monthly Sandostatin continued  Cycle 3 Lutathera 06/23/2018  Cycle 4 Lutathera 08/18/2018  Restaging dotatate PET scan 09/29/2018-multiple lesions again demonstrated within the liver which accumulate the radiotracer. The number of lesions which accumulate the tracer has decreased visually compared to the prior exam. Some lesions are no longer evident along the right hepatic margin. Remaining measurable lesions have radiotracer activity similar to prior. No new lesions present. Central mesenteric mass unchanged and remains avid for radiotracer. Adjacent lesion within the small bowel SUV max equal 10.5 compared to SUV max equal 15.8. Smaller lesion previously identified in the upper pelvis small bowel appears decreased in activity. No new metastatic lesions. Lesion position between the right psoas muscle and spine SUV max equal to 21.8 compared with SUV max equal 26.2.  Dotatate PET 10/19/2019-no evidence of disease progression, stable multifocal hepatic metastases, unchanged from post therapy scan 03/30/2019 and improved from pretherapy scan5/01/2018. Stable mesenteric mass, improved retroperitoneal lymph node adjacent to the celiac trunk, solitary small bowel lesion unchanged  Monthly Sandostatin continued  01/26/2020 chromogranin A level stable elevation  CT abdomen/pelvis 06/05/2020-slight enlargement of central mesenteric mass, extensive hepatic metastases-unchanged, bowel wall thickening in central small bowel loops and right colon  2.Diarrhea-likely secondary to carcinoid syndrome, improved with Sandostatin 3.History of anorexia/weight loss-improved 4.Prostatic  hypertrophy;status post laser vaporization 01/05/2018 5. Mild pancytopenia following Lutathera treatment 6.Thoracic dermatomal zoster rash 04/02/2019 treated with Valtrex 7.  Admission 06/05/2020  after a fall with failure to thrive, increased diarrhea, nausea, and abdominal pain 8.  Markedly elevated AST and CPK on admission -likely rhabdomyolysis 9.  Acute renal failure-improved 10.  Staph epidermidis bacteremia and E. coli UTI 06/22/2020 -completed course of IV antibiotics   [name] appears stable.  The renal failure has improved.  Has completed treatment for the UTI and bacteremia.  He would like to be discharged to [facility] when a bed is available.  He understands that he will not be able to receive octreotide or a long-acting somatostatin preparation while at [facility].  He can continue Lomotil.  Imodium can be added as needed.  Recommendations: 1.  Continue octreotide 150 mcg every 6 hours until discharge from the hospital 2.  Change to scheduled Lomotil, add Imodium as needed 3.  Please call oncology as needed, I will see him at [facility]    LOS: 23 days   Betsy Coder, MD   06/28/2020, 4:33 PM

## 2020-06-28 NOTE — Progress Notes (Signed)
Spoke with patient regarding his foley tube and rectal tube.  Specifically, discussed that we could remove them if the patient desired.  Pt states he would like to leave them in.  Given his comfort care status and disposition to hospice care, we will leave these in as long as pt desires to have them in place.

## 2020-06-28 NOTE — Progress Notes (Signed)
Manufacturing engineer [facility]) Hospital Liaison Note:  Unfortunately [facility] is not able to offer a room today.  An [facility] HLT member will follow up with CSW and family tomorrow or sooner if room becomes available.  Please do not hesitate to call with questions.  Thank you.   Gar Ponto, RN [place] Hospital Liaison  [place] are on AMION

## 2020-06-28 NOTE — Progress Notes (Signed)
Family Medicine Teaching Service Daily Progress Note Intern Pager: [phone]  Patient name: Charles Schmidt Medical record number: [id number] Date of birth: [birth year] Age: 79 y.o. Gender: male  Primary Care Provider: Nolene Ebbs, MD Consultants: Oncology, palliative care, urology, nephrology   Code Status: DNR  Pt Overview and Major Events to Date:  9/14:Admitted for weakness, vomiting, diarrhea. Oncology consulted 9/15: Extreme elevation in AST, GI and surgery consulted, CTA abd/pelvis obtained 9/16:Developed urinary retention and AKI, foley placed, urology and nephrology consulted 9/17: Worsening kidney function and hyperphosphatemia 9/24: Family meeting with palliative-decision for SNF placement 9/29: Fever of 100.5, 9/30: CXR concerning for b/l PNA, family opted for full scope tx, started on Vanc/Cefepime 10/1: Bld culture (+) staph epidermitis, Urine culture (+) E.coli, transitioned to Cefazolin 10/6 Completed antibiotic course  Assessment and Plan: Charles Schmidt is a 79 y.o. male intiallypresentedwith generalized weakness, vomiting, and diarrhea in the setting of rhabdomyolysis and ARF, now withE coli UTI and staph epidermitis bacteremia. PMHx significant GEX:[id number] carcinoid tumor, BPH s/p laser vaporization, and HTN. Medically stable for discharge.  Bacteremia  UTI Blood cultures growing pansensitive staph epidermidis and urine culture growing pansensitive E. coli.  Remains afebrile, last fever 9/30. Completed course of antibiotics. - s/p vanc/cefepime (9/30) - s/p IV cefazolin (10/1-6)  Goals of care Patient with metastatic carcinoid tumor and worsening kidney function.   Upon discussions with patient and family members, ultimately decided on hospice at [facility]. - [facility] - OK to have wine  Acute renal failure Improving. Most recent Cr 1.86. - Foley in place - strict I/O - continue bicarb  Hypokalemia K 3.3, repleted. No plan to check  further labs.  HTN Normotensive.  Amlodipine held for soft BP. - holding amlodipine  FEN/GI: regular diet PPx: enoxaparin (renally dosed)  Disposition: hospice [facility])  Subjective:  Patient seen with son [name] this morning. Feels good, no complaints. Requesting to have a small cup of wine with dinner.  Objective: Temp:  [97.5 F (36.4 C)-98.6 F (37 C)] 98.1 F (36.7 C) (10/07 0857) Pulse Rate:  [86-95] 86 (10/07 0857) Resp:  [16-18] 18 (10/07 0857) BP: (135-149)/(81-86) 135/86 (10/07 0857) SpO2:  [97 %-99 %] 97 % (10/07 0857) Weight:  [103.1 kg] 103.1 kg (10/07 0435) Physical Exam: General:Elderly male resting comfortably in bed, NAD Cardiovascular:RRR, no murmurs Respiratory:CTAB, no wheezes or rales Abdomen:soft, non-tender, +BS Extremities:WWP, trace edema Neuro: alert, conversant  Laboratory: Recent Labs  Lab 06/22/20 1021 06/24/20 0413 06/25/20 0348  WBC 10.1 4.8 4.6  HGB 10.3* 9.2* 8.9*  HCT 31.1* 27.0* 26.1*  PLT 121* 115* 150   Recent Labs  Lab 06/22/20 1021 06/24/20 0413 06/25/20 0348 06/26/20 0556 06/27/20 0931  NA 142   < > 137 138 139  K 3.6   < > 3.1* 2.8* 3.3*  CL 112*   < > 107 106 107  CO2 17*   < > 19* 20* 21*  BUN 47*   < > 41* 38* 32*  CREATININE 2.61*   < > 2.21* 1.92* 1.86*  CALCIUM 7.8*   < > 7.5* 7.6* 7.6*  PROT 6.1*  --   --   --   --   BILITOT 1.1  --   --   --   --   ALKPHOS 243*  --   --   --   --   ALT 22  --   --   --   --   AST 40  --   --   --   --  GLUCOSE 149*   < > 101* 114* 139*   < > = values in this interval not displayed.     Imaging/Diagnostic Tests: No new imaging.  Zola Button, MD 06/28/2020, 9:08 AM PGY-1, [place] Intern pager: [phone], text pages welcome

## 2020-06-28 NOTE — Plan of Care (Signed)
  Problem: Clinical Measurements: Goal: Will remain free from infection Outcome: Progressing Goal: Respiratory complications will improve Outcome: Progressing   Problem: Coping: Goal: Level of anxiety will decrease Outcome: Progressing   Problem: Safety: Goal: Ability to remain free from injury will improve Outcome: Progressing   

## 2020-06-29 DIAGNOSIS — C7B8 Other secondary neuroendocrine tumors: Secondary | ICD-10-CM | POA: Diagnosis not present

## 2020-06-29 LAB — CULTURE, BLOOD (ROUTINE X 2)
Culture: NO GROWTH
Culture: NO GROWTH

## 2020-06-29 MED ORDER — SPIRITUS FRUMENTI
1.0000 | Freq: Once | ORAL | Status: AC
  Administered 2020-06-29: 1 via ORAL
  Filled 2020-06-29: qty 1

## 2020-06-29 NOTE — Progress Notes (Signed)
Family Medicine Teaching Service Daily Progress Note Intern Pager: [phone]  Patient name: Charles Schmidt Medical record number: [id number] Date of birth: [birth year] Age: 79 y.o. Gender: male  Primary Care Provider: Nolene Ebbs, MD Consultants: Oncology, palliative care, urology, nephrology   Code Status: DNR  Pt Overview and Major Events to Date:  9/14:Admitted for weakness, vomiting, diarrhea. Oncology consulted 9/15: Extreme elevation in AST, GI and surgery consulted, CTA abd/pelvis obtained 9/16:Developed urinary retention and AKI, foley placed, urology and nephrology consulted 9/17: Worsening kidney function and hyperphosphatemia 9/24: Family meeting with palliative-decision for SNF placement 9/29: Fever of 100.5, 9/30: CXR concerning for b/l PNA, family opted for full scope tx, started on Vanc/Cefepime 10/1: Bld culture (+) staph epidermitis, Urine culture (+) E.coli, transitioned to Cefazolin 10/6 Completed antibiotic course  Assessment and Plan: [name] is a 79 y.o. male intiallypresentedwith generalized weakness, vomiting, and diarrhea in the setting of rhabdomyolysis and ARF, now withE coli UTI and staph epidermitis bacteremia. PMHx significant JYN:[id number] carcinoid tumor, BPH s/p laser vaporization, and HTN. Medically stable for discharge.  Bacteremia  UTI Blood cultures growing pansensitive staph epidermidis and urine culture growing pansensitive E. coli.  Remains afebrile, last fever 9/30. Completed course of antibiotics. - s/p vanc/cefepime (9/30) - s/p IV cefazolin (10/1-6)  Goals of care Patient with metastatic carcinoid tumor and worsening kidney function.   Upon discussions with patient and family members, ultimately decided on hospice at [facility]. - [facility], awaiting bed - OK to have wine  Acute renal failure Improving. Most recent Cr 1.86. - Foley in place - strict I/O - continue bicarb  HTN Normotensive.  Amlodipine  held for soft BP. - holding amlodipine  FEN/GI: regular diet PPx: enoxaparin (renally dosed)  Disposition: hospice [facility])  Subjective:  Patient seen alone this morning eating breakfast.  Feels good, no complaints.  Enjoyed the wine last night.  Objective: Temp:  [98 F (36.7 C)-98.7 F (37.1 C)] 98.7 F (37.1 C) (10/08 0341) Pulse Rate:  [89-90] 90 (10/08 0341) Resp:  [18] 18 (10/08 0341) BP: (135-154)/(71-91) 135/71 (10/08 0341) SpO2:  [97 %-100 %] 100 % (10/08 0341) Weight:  [102.8 kg] 102.8 kg (10/08 0332) Physical Exam: General:Elderly male resting comfortably in bed, NAD Cardiovascular:RRR, no murmurs Respiratory:CTAB, no wheezes or rales Abdomen:soft, non-tender, +BS Extremities:WWP, 2+ pitting edema Neuro: alert, conversant  Laboratory: Recent Labs  Lab 06/22/20 1021 06/24/20 0413 06/25/20 0348  WBC 10.1 4.8 4.6  HGB 10.3* 9.2* 8.9*  HCT 31.1* 27.0* 26.1*  PLT 121* 115* 150   Recent Labs  Lab 06/22/20 1021 06/24/20 0413 06/25/20 0348 06/26/20 0556 06/27/20 0931  NA 142   < > 137 138 139  K 3.6   < > 3.1* 2.8* 3.3*  CL 112*   < > 107 106 107  CO2 17*   < > 19* 20* 21*  BUN 47*   < > 41* 38* 32*  CREATININE 2.61*   < > 2.21* 1.92* 1.86*  CALCIUM 7.8*   < > 7.5* 7.6* 7.6*  PROT 6.1*  --   --   --   --   BILITOT 1.1  --   --   --   --   ALKPHOS 243*  --   --   --   --   ALT 22  --   --   --   --   AST 40  --   --   --   --   GLUCOSE 149*   < >  101* 114* 139*   < > = values in this interval not displayed.     Imaging/Diagnostic Tests: No new imaging.  Zola Button, MD 06/29/2020, 9:31 AM PGY-1, [place] Intern pager: [phone], text pages welcome

## 2020-06-29 NOTE — Progress Notes (Signed)
[facility] ([facility]) Hospital Liaison note.  Unfortunately,  [facility] is unable to offer a room today. Hospital Liaison will follow up tomorrow or sooner if a room becomes available.   Please do not hesitate to call with questions.    Thank you for the opportunity to participate in this patient's care.  Chrislyn King, BSN, RN [facility] Hospital Liaison (listed on AMION under Hospice[facility])    [phone]    (24h on call) 

## 2020-06-29 NOTE — Progress Notes (Signed)
CSW confirmed that there are still no [facility] beds are available. CSW will continue to follow.

## 2020-06-30 DIAGNOSIS — C7B8 Other secondary neuroendocrine tumors: Secondary | ICD-10-CM | POA: Diagnosis not present

## 2020-06-30 MED ORDER — OCTREOTIDE ACETATE 50 MCG/ML IJ SOLN
150.0000 ug | Freq: Once | INTRAMUSCULAR | Status: AC
  Administered 2020-06-30: 150 ug via SUBCUTANEOUS

## 2020-06-30 MED ORDER — OCTREOTIDE ACETATE 100 MCG/ML IJ SOLN
150.0000 ug | Freq: Four times a day (QID) | INTRAMUSCULAR | Status: DC
  Administered 2020-06-30 – 2020-07-05 (×20): 150 ug via SUBCUTANEOUS
  Filled 2020-06-30 (×25): qty 1.5

## 2020-06-30 NOTE — Progress Notes (Signed)
[facility] ([facility]) Hospital Liaison note.  Unfortunately,  [facility] is unable to offer a room today. Hospital Liaison will follow up tomorrow or sooner if a room becomes available.   Please do not hesitate to call with questions.    Thank you for the opportunity to participate in this patient's care.  Chrislyn King, BSN, RN [facility] Hospital Liaison (listed on AMION under Hospice[facility])    [phone]    (24h on call) 

## 2020-06-30 NOTE — Progress Notes (Signed)
Pt refused medications for tonight stating "please you don't know what ive been through , please leave me alone" ` Pt wishes to be discharged  Will continue to monitor

## 2020-06-30 NOTE — Progress Notes (Signed)
Family Medicine Teaching Service Daily Progress Note Intern Pager: [phone]  Patient name: Charles Schmidt Medical record number: [id number] Date of birth: Nov 19, 1940 Age: 79 y.o. Gender: male  Primary Care Provider: Nolene Ebbs, MD Consultants: Oncology, palliative care, urology, nephrology Code Status: DNR  Pt Overview and Major Events to Date:  9/14:Admitted for weakness, vomiting, diarrhea. Oncology consulted 9/15: Extreme elevation in AST, GI and surgery consulted, CTA abd/pelvis obtained 9/16:Developed urinary retention and AKI, foley placed, urology and nephrology consulted 9/17: Worsening kidney function and hyperphosphatemia 9/24: Family meeting with palliative-decision for SNF placement 9/29: Fever of 100.5, 9/30: CXR concerning for b/l PNA, family opted for full scope tx, started on Vanc/Cefepime 10/1: Bld culture (+) staph epidermitis, Urine culture (+) E.coli, transitioned to Cefazolin 10/6 Completed antibiotic course, MEDICALLY STABLE FOR DISCHARGE  Assessment and Plan: Charles Schmidt is a 79 y.o. male intiallypresentedwith generalized weakness, vomiting, and diarrhea in the setting of rhabdomyolysis and ARF, now withE coli UTI and staph epidermitis bacteremia. PMHx significant FFM:[id number] carcinoid tumor, BPH s/p laser vaporization, and HTN.  Goals of care  Discharge to Hospice  Patient with metastatic carcinoid tumor with clinic decline throughout admission. Patient has opted to discharge to Hospice for continued care focused on comfort. - [facility]po [facility], awaiting bed - OK to have wine - per family, okay to discontinue daily labs - awaiting further discussing concerning lovenox - continue comfort measures for diarrhea including Lomotil and Octreotide  - continue foley and rectal tube per patient request  Bacteremia  UTI: resolved Blood cultures grew pansensitive staph epidermidis and urine culture growing pansensitive E. Coli. S/p vanc/cefepime (9/30)  and Cefazolin (10/1-10/6) Remains afebrile, last fever 9/30.   Acute renal failure: improving Downtrended from max of 6.51 to 1.86 on 10/6. Continues to have adequate UOP. No further lab draws to monitor cr.  - Foley in place, plan to discharge to hospice with foley - routine I/O's, consider discontinuing - continue bicarb  HTN Normotensive overnight.  Home Amlodipine held for soft BP. - holding amlodipine  FEN/GI:regular diet + boost  TSV:[id number] (renally dosed)  Disposition: hospice [facility]), awaiting bed  Subjective:  Patient resting comfortably in bed this morning. Denies any concerns or complaints. Denies any pain.   Objective: Temp:  [97.9 F (36.6 C)-98.2 F (36.8 C)] 98.1 F (36.7 C) (10/09 0445) Pulse Rate:  [85-91] 88 (10/09 0445) Resp:  [17-18] 18 (10/09 0445) BP: (135-140)/(88-91) 140/91 (10/09 0445) SpO2:  [93 %-99 %] 93 % (10/09 0445) Weight:  [102.5 kg] 102.5 kg (10/09 0100) Physical Exam: General: pleasant thin elderly male, resting comfortably in bed with towel over his eyes,  in no acute distress with non-toxic appearance Resp: breathing comfortably on room air, speaking in full sentences Abdomen: soft, non-tender, normoactive bowel sounds Skin: warm, dry Neuro: Alert and oriented, speech mumbled    Laboratory: Recent Labs  Lab 06/24/20 0413 06/25/20 0348  WBC 4.8 4.6  HGB 9.2* 8.9*  HCT 27.0* 26.1*  PLT 115* 150   Recent Labs  Lab 06/25/20 0348 06/26/20 0556 06/27/20 0931  NA 137 138 139  K 3.1* 2.8* 3.3*  CL 107 106 107  CO2 19* 20* 21*  BUN 41* 38* 32*  CREATININE 2.21* 1.92* 1.86*  CALCIUM 7.5* 7.6* 7.6*  GLUCOSE 101* 114* 139*    Imaging/Diagnostic Tests: No results found.   Mina Marble Worth, DO 06/30/2020, 6:02 AM PGY-3, [place] Intern pager: [phone], text pages welcome

## 2020-06-30 NOTE — Plan of Care (Signed)
  Problem: Clinical Measurements: Goal: Ability to maintain clinical measurements within normal limits will improve Outcome: Progressing   Problem: Education: Goal: Knowledge of General Education information will improve Description: Including pain rating scale, medication(s)/side effects and non-pharmacologic comfort measures Outcome: Progressing

## 2020-07-01 DIAGNOSIS — C7B8 Other secondary neuroendocrine tumors: Secondary | ICD-10-CM | POA: Diagnosis not present

## 2020-07-01 MED ORDER — LOPERAMIDE HCL 2 MG PO CAPS
2.0000 mg | ORAL_CAPSULE | ORAL | Status: DC | PRN
  Administered 2020-07-03: 2 mg via ORAL
  Filled 2020-07-01: qty 1

## 2020-07-01 NOTE — Progress Notes (Signed)
Manufacturing engineer [facility])  [place] does not have any beds today.  Updated family.  Venia Carbon RN, BSN, [place] Hospital Liaison

## 2020-07-01 NOTE — Plan of Care (Signed)

## 2020-07-01 NOTE — Progress Notes (Addendum)
Family Medicine Teaching Service Daily Progress Note Intern Pager: [phone]  Patient name: Charles Schmidt Medical record number: [id number] Date of birth: 1941/03/19 Age: 79 y.o. Gender: male  Primary Care Provider: Nolene Ebbs, MD Consultants: Oncology, palliative care, urology, nephrology Code Status: DNR  Pt Overview and Major Events to Date:  9/14:Admitted for weakness, vomiting, diarrhea. Oncology consulted 9/15: Extreme elevation in AST, GI and surgery consulted, CTA abd/pelvis obtained 9/16:Developed urinary retention and AKI, foley placed, urology and nephrology consulted 9/17: Worsening kidney function and hyperphosphatemia 9/24: Family meeting with palliative-decision for SNF placement 9/29: Fever of 100.5, 9/30: CXR concerning for b/l PNA, family opted for full scope tx, started on Vanc/Cefepime 10/1: Bld culture (+) staph epidermitis, Urine culture (+) E.coli, transitioned to Cefazolin 10/6 Completed antibiotic course, MEDICALLY STABLE FOR DISCHARGE  Assessment and Plan: Charles Schmidt is a 79 y.o. male intiallypresentedwith generalized weakness, vomiting, and diarrhea in the setting of rhabdomyolysis and ARF, then withE coli UTI and staph epidermitis bacteremia, patient now status post completed antibiotic course. PMHx significant QMV:[id number] carcinoid tumor, BPH s/p laser vaporization, and HTN.  Goals of care  Discharge to Hospice  Patient with metastatic carcinoid tumor with clinical decline throughout admission. Patient has opted to discharge to Hospice for continued care focused on comfort. - [facility]po [facility], awaiting bed - OK to have wine - per family, okay to discontinue daily labs - awaiting further discussing concerning lovenox - continue comfort measures for diarrhea including Lomotil and Octreotide  - continue foley and rectal tube per patient request  Bacteremia  UTI: resolved Blood cultures grew pansensitive staph epidermidis and urine  culture growing pansensitive E. Coli. S/p vanc/cefepime (9/30) and Cefazolin (10/1-10/6) Remains afebrile, last fever 9/30.   Acute renal failure: improving Downtrended from max of 6.51 to 1.86 on 10/6. Continues to have adequate UOP. No further lab draws to monitor cr.  - Foley in place, plan to discharge to hospice with foley - routine I/O's, consider discontinuing - continue bicarb tablet 1300 mg 3 times daily - Rechecking BMP Monday 10/11  HTN BP overnight 140-150/91-95, have been holding hold amlodipine 5 mg due to lower blood pressures. -Continue to hold amlodipine  FEN/GI:regular diet + boost  WUX:[id number] (renally dosed)  Disposition: hospice [facility]), awaiting bed  Subjective:  Patient seen resting in bed comfortably, reports that he continues to have loose stools and that is the #1 thing bothering him right now.  Objective: Temp:  [97.8 F (36.6 C)-98.5 F (36.9 C)] 98.5 F (36.9 C) (10/09 2010) Pulse Rate:  [88-92] 92 (10/09 2010) Resp:  [18] 18 (10/09 2010) BP: (140-151)/(91-95) 151/95 (10/09 2010) SpO2:  [93 %-98 %] 98 % (10/09 2010) Weight:  [104.8 kg] 104.8 kg (10/10 0035) Physical Exam: General: Pleasant thin male Resp: breathing comfortably on room air, speaking in full sentences Abdomen: soft, non-tender, normoactive bowel sounds Skin: warm, dry Neuro: Alert and oriented, speech mumbled    Laboratory: Recent Labs  Lab 06/25/20 0348  WBC 4.6  HGB 8.9*  HCT 26.1*  PLT 150   Recent Labs  Lab 06/25/20 0348 06/26/20 0556 06/27/20 0931  NA 137 138 139  K 3.1* 2.8* 3.3*  CL 107 106 107  CO2 19* 20* 21*  BUN 41* 38* 32*  CREATININE 2.21* 1.92* 1.86*  CALCIUM 7.5* 7.6* 7.6*  GLUCOSE 101* 114* 139*    Imaging/Diagnostic Tests: No results found.   Daisy Floro, DO 07/01/2020, 4:22 AM PGY-3, [place] Intern pager: [phone], text  pages welcome

## 2020-07-01 NOTE — Plan of Care (Signed)
  Problem: Education: Goal: Knowledge of General Education information will improve Description: Including pain rating scale, medication(s)/side effects and non-pharmacologic comfort measures 07/01/2020 2347 by Mollie Germany, RN Outcome: Progressing 07/01/2020 2334 by Mollie Germany, RN Outcome: Progressing   Problem: Health Behavior/Discharge Planning: Goal: Ability to manage health-related needs will improve 07/01/2020 2347 by Mollie Germany, RN Outcome: Progressing 07/01/2020 2334 by Mollie Germany, RN Outcome: Progressing   Problem: Clinical Measurements: Goal: Ability to maintain clinical measurements within normal limits will improve 07/01/2020 2347 by Mollie Germany, RN Outcome: Progressing 07/01/2020 2334 by Mollie Germany, RN Outcome: Progressing Goal: Will remain free from infection Outcome: Progressing

## 2020-07-02 DIAGNOSIS — R1115 Cyclical vomiting syndrome unrelated to migraine: Secondary | ICD-10-CM | POA: Diagnosis not present

## 2020-07-02 DIAGNOSIS — N179 Acute kidney failure, unspecified: Secondary | ICD-10-CM | POA: Diagnosis not present

## 2020-07-02 LAB — BASIC METABOLIC PANEL
Anion gap: 11 (ref 5–15)
BUN: 23 mg/dL (ref 8–23)
CO2: 22 mmol/L (ref 22–32)
Calcium: 7.9 mg/dL — ABNORMAL LOW (ref 8.9–10.3)
Chloride: 108 mmol/L (ref 98–111)
Creatinine, Ser: 1.51 mg/dL — ABNORMAL HIGH (ref 0.61–1.24)
GFR, Estimated: 43 mL/min — ABNORMAL LOW (ref >60)
Glucose, Bld: 140 mg/dL — ABNORMAL HIGH (ref 70–99)
Potassium: 3.4 mmol/L — ABNORMAL LOW (ref 3.5–5.1)
Sodium: 141 mmol/L (ref 135–145)

## 2020-07-02 LAB — CBC WITH DIFFERENTIAL/PLATELET
Abs Immature Granulocytes: 0.03 10*3/uL (ref 0.00–0.07)
Basophils Absolute: 0 10*3/uL (ref 0.0–0.1)
Basophils Relative: 1 %
Eosinophils Absolute: 0.1 10*3/uL (ref 0.0–0.5)
Eosinophils Relative: 2 %
HCT: 26.4 % — ABNORMAL LOW (ref 39.0–52.0)
Hemoglobin: 9 g/dL — ABNORMAL LOW (ref 13.0–17.0)
Immature Granulocytes: 1 %
Lymphocytes Relative: 22 %
Lymphs Abs: 1.1 10*3/uL (ref 0.7–4.0)
MCH: 25.9 pg — ABNORMAL LOW (ref 26.0–34.0)
MCHC: 34.1 g/dL (ref 30.0–36.0)
MCV: 75.9 fL — ABNORMAL LOW (ref 80.0–100.0)
Monocytes Absolute: 0.4 10*3/uL (ref 0.1–1.0)
Monocytes Relative: 7 %
Neutro Abs: 3.5 10*3/uL (ref 1.7–7.7)
Neutrophils Relative %: 67 %
Platelets: 274 10*3/uL (ref 150–400)
RBC: 3.48 MIL/uL — ABNORMAL LOW (ref 4.22–5.81)
RDW: 21.2 % — ABNORMAL HIGH (ref 11.5–15.5)
WBC: 5.2 10*3/uL (ref 4.0–10.5)
nRBC: 0 % (ref 0.0–0.2)

## 2020-07-02 LAB — SARS CORONAVIRUS 2 BY RT PCR (HOSPITAL ORDER, PERFORMED IN ~~LOC~~ HOSPITAL LAB): SARS Coronavirus 2: NEGATIVE

## 2020-07-02 MED ORDER — ALUM & MAG HYDROXIDE-SIMETH 200-200-20 MG/5ML PO SUSP
30.0000 mL | Freq: Four times a day (QID) | ORAL | Status: DC | PRN
  Administered 2020-07-02: 30 mL via ORAL
  Filled 2020-07-02: qty 30

## 2020-07-02 NOTE — Evaluation (Signed)
Physical Therapy Evaluation Patient Details Name: Charles Schmidt MRN: [id number] DOB: January 08, 1941 Today's Date: 07/02/2020   History of Present Illness  Juddson Cobern is a 79 y.o. male presenting with weakness in the setting of vomiting and diarrhea. PMH is significant for metastatic carcinoid tumor, BPH s/p laser vaporization, and HTN.  On precautions for Cdiff.    Clinical Impression  Pt admitted with above diagnosis. Pt was able to come to eOB with mod assist.  Mod to max assist for scoot pivot transfer to recliner.  Family can push recliner in hallways when they come in if they want to as the hospital doesn't have a wheelchair to accomodate pt to be pushed around in halls.  Recliner will roll and they can take him out of room in recliner.  Nurse made aware.  Pt did participate fairly well with PT and seemed happy to be up in chair.  Will continue PT and progress pt as able. Pt currently with functional limitations due to the deficits listed below (see PT Problem List). Pt will benefit from skilled PT to increase their independence and safety with mobility to allow discharge to the venue listed below.      Follow Up Recommendations SNF;Supervision/Assistance - 24 hour    Equipment Recommendations  None recommended by PT    Recommendations for Other Services       Precautions / Restrictions Precautions Precautions: Fall Restrictions Weight Bearing Restrictions: No      Mobility  Bed Mobility Overal bed mobility: Needs Assistance Bed Mobility: Supine to Sit;Rolling Rolling: Min guard (use of bedrails)   Supine to sit: Mod assist     General bed mobility comments: Mod assist to come to EOB with pt pulling up on PT.  Took incr time and effort from pt.   Transfers Overall transfer level: Needs assistance   Transfers: Lateral/Scoot Transfers          Lateral/Scoot Transfers: Mod assist;Max assist;From elevated surface General transfer comment: Lateral scoot to pts left  with arm dropped to recliner.  Pt able to assist some with transfer and lift enough to scoot.   Ambulation/Gait                Stairs            Wheelchair Mobility    Modified Rankin (Stroke Patients Only)       Balance Overall balance assessment: Needs assistance Sitting-balance support: No upper extremity supported;Feet supported Sitting balance-Leahy Scale: Good Sitting balance - Comments: pt performed sitting EOB with feet supported and B UEs on bed >5 minutes                                      Pertinent Vitals/Pain Pain Assessment: Faces Faces Pain Scale: Hurts little more Pain Location: low back, BLEs Pain Descriptors / Indicators: Grimacing;Guarding Pain Intervention(s): Limited activity within patient's tolerance;Monitored during session;Repositioned    Home Living Family/patient expects to be discharged to:: Private residence Living Arrangements: Alone Available Help at Discharge: Family;Available PRN/intermittently Type of Home: House Home Access: Stairs to enter Entrance Stairs-Rails: Left Entrance Stairs-Number of Steps: 3 Home Layout: One level Home Equipment: Cane - single point;Bedside commode;Grab bars - tub/shower      Prior Function Level of Independence: Independent with assistive device(s)         Comments: B/D self     Hand Dominance   Dominant Hand: Right  Extremity/Trunk Assessment   Upper Extremity Assessment Upper Extremity Assessment: Defer to OT evaluation RUE Deficits / Details: edematous, elevated on pillows LUE Deficits / Details: edematous, elevated on pillows    Lower Extremity Assessment Lower Extremity Assessment: RLE deficits/detail;LLE deficits/detail RLE Deficits / Details: grossly 3-/5 LLE Deficits / Details: grossly 3-/5    Cervical / Trunk Assessment Cervical / Trunk Assessment: Kyphotic  Communication   Communication: No difficulties  Cognition Arousal/Alertness:  Awake/alert Behavior During Therapy: Flat affect Overall Cognitive Status: Within Functional Limits for tasks assessed                                        General Comments      Exercises General Exercises - Lower Extremity Ankle Circles/Pumps: PROM;Both;10 reps;Supine   Assessment/Plan    PT Assessment Patient needs continued PT services  PT Problem List Decreased balance;Decreased mobility;Decreased activity tolerance;Decreased knowledge of use of DME;Decreased safety awareness;Decreased knowledge of precautions;Decreased strength;Decreased range of motion       PT Treatment Interventions DME instruction;Gait training;Functional mobility training;Therapeutic activities;Therapeutic exercise;Balance training;Patient/family education;Stair training    PT Goals (Current goals can be found in the Care Plan section)  Acute Rehab PT Goals Patient Stated Goal: to go home PT Goal Formulation: With patient Time For Goal Achievement: 07/16/20 Potential to Achieve Goals: Poor    Frequency Min 2X/week   Barriers to discharge Decreased caregiver support      Co-evaluation               AM-PAC PT "6 Clicks" Mobility  Outcome Measure Help needed turning from your back to your side while in a flat bed without using bedrails?: A Lot Help needed moving from lying on your back to sitting on the side of a flat bed without using bedrails?: A Lot Help needed moving to and from a bed to a chair (including a wheelchair)?: A Lot Help needed standing up from a chair using your arms (e.g., wheelchair or bedside chair)?: A Lot Help needed to walk in hospital room?: Total Help needed climbing 3-5 steps with a railing? : Total 6 Click Score: 10    End of Session Equipment Utilized During Treatment: Gait belt Activity Tolerance: Patient limited by fatigue Patient left: with call bell/phone within reach;in chair;with chair alarm set Nurse Communication: Mobility status PT  Visit Diagnosis: Muscle weakness (generalized) (M62.81);Unsteadiness on feet (R26.81)    Time: 8416-6063 PT Time Calculation (min) (ACUTE ONLY): 30 min   Charges:   PT Evaluation $PT Eval Moderate Complexity: 1 Mod PT Treatments $Therapeutic Activity: 8-22 mins        Hinda Lindor W,PT Acute Rehabilitation Services Pager:  [phone]  Office:  [place] 07/02/2020, 10:55 AM

## 2020-07-02 NOTE — Progress Notes (Addendum)
IP PROGRESS NOTE   Subjective:  Charles Schmidt is alert.  He denies pain.  No diarrhea.  Wants to work with physical therapy.   Objective: Vital signs in last 24 hours: Blood pressure 138/89, pulse 98, temperature 98.9 F (37.2 C), temperature source Oral, resp. rate 16, height 6' 4" (1.93 m), weight 104.8 kg, SpO2 96 %.  Intake/Output from previous day: 10/10 0701 - 10/11 0700 In: -  Out: 1550 [Urine:1550]  Physical Exam: HEENT: No thrush Abdomen: Marked hepatomegaly extending into the left abdomen, tender in the mid and left abdomen, ascites Extremities: No edema Neurologic: Alert and oriented  Lab Results: Recent Labs    07/02/20 0830  WBC 5.2  HGB 9.0*  HCT 26.4*  PLT 274    BMET No results for input(s): NA, K, CL, CO2, GLUCOSE, BUN, CREATININE, CALCIUM in the last 72 hours.  No results found for: CEA1  Studies/Results: No results found. CT images reviewed  Medications: I have reviewed the patient's current medications.  Assessment/Plan: 1.Metastatic carcinoid tumor, biopsy of a liver lesion 10/23/2017 consistent with a well-differentiated neuroendocrine neoplasm, WHO grade 2,ki-6710%  CT abdomen/pelvis 10/13/2017-extensive liver metastases, cirrhosis, soft tissue mass in the small bowel mesentery versus a primary small bowel tumor, tiny lucent lesions in the pelvic bones  Elevated chromogranin A and 24-hour urine 5-HIAA  CT chest 11/24/2017-subpleural nodule in the left lower lobe with associated radiotracer activity on comparison DOTATATEPET scan. No additional evidence of thoracic metastasis.  DOTATATE PET scan3/01/2018-intense radiotracer accumulation with innumerable confluent hepatic metastasis; intense radiotracer activity within central mesenteric mass; 2 foci of uptake associated with the small bowel; intense radiotracer activity associated periaortic and paraspinal lymph nodes; more distant solitary small metastasis within the pleural space left lower  lobe.  Monthly Sandostatin initiated 11/26/2017, dose increased to 30 mg 01/21/2018  Cycle 1 Lutathera 03/03/2018, monthly Sandostatin continued  Cycle 2 Lutathera 04/27/2018, monthly Sandostatin continued  Cycle 3 Lutathera 06/23/2018  Cycle 4 Lutathera 08/18/2018  Restaging dotatate PET scan 09/29/2018-multiple lesions again demonstrated within the liver which accumulate the radiotracer. The number of lesions which accumulate the tracer has decreased visually compared to the prior exam. Some lesions are no longer evident along the right hepatic margin. Remaining measurable lesions have radiotracer activity similar to prior. No new lesions present. Central mesenteric mass unchanged and remains avid for radiotracer. Adjacent lesion within the small bowel SUV max equal 10.5 compared to SUV max equal 15.8. Smaller lesion previously identified in the upper pelvis small bowel appears decreased in activity. No new metastatic lesions. Lesion position between the right psoas muscle and spine SUV max equal to 21.8 compared with SUV max equal 26.2.  Dotatate PET 10/19/2019-no evidence of disease progression, stable multifocal hepatic metastases, unchanged from post therapy scan 03/30/2019 and improved from pretherapy scan5/01/2018. Stable mesenteric mass, improved retroperitoneal lymph node adjacent to the celiac trunk, solitary small bowel lesion unchanged  Monthly Sandostatin continued  01/26/2020 chromogranin A level stable elevation  CT abdomen/pelvis 06/05/2020-slight enlargement of central mesenteric mass, extensive hepatic metastases-unchanged, bowel wall thickening in central small bowel loops and right colon  2.Diarrhea-likely secondary to carcinoid syndrome, improved with Sandostatin 3.History of anorexia/weight loss-improved 4.Prostatic hypertrophy;status post laser vaporization 01/05/2018 5. Mild pancytopenia following Lutathera treatment 6.Thoracic dermatomal zoster rash 04/02/2019  treated with Valtrex 7.  Admission 06/05/2020 after a fall with failure to thrive, increased diarrhea, nausea, and abdominal pain 8.  Markedly elevated AST and CPK on admission -likely rhabdomyolysis 9.  Acute renal failure-improved 10.    Staph epidermidis bacteremia and E. coli UTI 06/22/2020 -completed course of IV antibiotics   Charles Schmidt appears stable.  The renal failure has improved.  Has completed treatment for the UTI and bacteremia.  His performance status has continued to improve over the past few days.  He wants to work with physical therapy.  The patient would no longer be a good candidate for [facility] given that his life expectancy may be at least 1 year.  This was discussed with the patient and his son.  Recommend SNF placement versus home with family.  Remains on octreotide and Lomotil and we can consider switching him to Sandostatin LAR or lanreotide once he is discharged.  Recommendations: 1.  Continue octreotide 150 mcg every 6 hours  2.  Continue Lomotil and as needed Imodium 3.  Recommend for the patient to continue to work with PT/OT.  He would benefit from SNF placement and is no longer a candidate for [facility]. 4.  We will arrange for outpatient follow-up at the cancer center once he is discharged.    LOS: 27 days   Charles Bussing, NP   07/02/2020, 1:45 PM  Charles Schmidt was interviewed and examined.  His clinical status has improved over the past few weeks.  I feel he is most likely no longer a candidate for [facility].  He states that he would like to participate in physical therapy so he can ambulate.  I agree with skilled nursing facility placement for physical therapy and ongoing care.  We can continue a long-acting somatostatin analog as an outpatient.

## 2020-07-02 NOTE — Progress Notes (Addendum)
FMTS Attending Daily Note: Charles Singh, MD  Team Pager [phone] Pager [phone]  I have seen and examined this patient, reviewed their chart. I have discussed this patient with the resident. I agree with the resident's findings, assessment and care plan.  Significant change in plan for patient today. Patient was to discharge to hospice (see note dated 10/10 with patient status stating hospice). See below for details of plan.     Status is: Inpatient  Remains inpatient appropriate because:Ongoing diagnostic testing needed not appropriate for outpatient work up   Dispo:  Patient From:    Planned Disposition: [place]  Expected discharge date: 07/03/20  Medically stable for discharge: No         Family Medicine Teaching Service Daily Progress Note Intern Pager: [phone]  Patient name: Charles Schmidt Medical record number: [id number] Date of birth: Jun 04, 1941 Age: 79 y.o. Gender: male  Primary Care Provider: Nolene Ebbs, MD Consultants: Oncology, palliative care, urology, nephrology   Code Status: DNR  Pt Overview and Major Events to Date:  9/14:Admitted for weakness, vomiting, diarrhea. Oncology consulted 9/15: Extreme elevation in AST, GI and surgery consulted, CTA abd/pelvis obtained 9/16:Developed urinary retention and AKI, foley placed, urology and nephrology consulted 9/17: Worsening kidney function and hyperphosphatemia 9/24: Family meeting with palliative-decision for SNF placement 9/29: Fever of 100.5, 9/30: CXR concerning for b/l PNA, family opted for full scope tx, started on Vanc/Cefepime 10/1: Bld culture (+) staph epidermitis, Urine culture (+) E.coli, transitioned to Cefazolin 10/6 Completed antibiotic course  Assessment and Plan: Charles Schmidt is a 79 y.o. male intiallypresentedwith generalized weakness, vomiting, and diarrhea in the setting of rhabdomyolysis and ARF, now withE coli UTI and staph epidermitis bacteremia. PMHx  significant KZS:WFUXNATFTD carcinoid tumor, BPH s/p laser vaporization, and HTN.  Acute renal failure Baseline creatinine appears to be around 1. Last creatinine was 1.86. This was on 6 October. We will need to repeat this to evaluate. This is thought to be in part obstructive uropathy -We will need voiding trial at hospice. Will restart finasteride and alpha-blocker.  Metabolic disarray, previously hypokalemic and acidotic. Will obtain renal function panel and replete as indicated. May need to reconsult nephrology pending goals of care as well as values obtained.  Diarrhea Secondary to carcinoid tumor. - octreotide - loperamide prn -We will remove rectal tube.  Goals of care Patient with metastatic carcinoid tumor and worsening kidney function.   Upon discussions with patient and family members, ultimately decided on hospice at [facility].  As of this morning, patient had a discussion with Dr. Benay Spice and decided that [facility] rehab would be the best option. Will give some time for family to discuss. Will discuss again with palliative care as well.  Bacteremia  UTI- treated and resolved.  Blood cultures growing pansensitive staph epidermidis and urine culture growing pansensitive E. coli.  Remains afebrile, last fever 9/30. Completed course of antibiotics. - s/p vanc/cefepime (9/30) - s/p IV cefazolin (10/1-10/6)  HTN Normotensive.  Amlodipine held for soft BP. We will restart amlodipine in light of change of goals of care.  FEN/GI: regular diet PPx: enoxaparin (renally dosed), f/u CBC  Disposition: We will discharge to skilled nursing when medically stable pending electrolytes and evaluation of sacrum.  Subjective:  Patient reports feeling good this morning.  States he had a conversation with Dr. Benay Spice earlier this morning with [name] present.  States he and Dr. Benay Spice both agreed that rehab would be the best place for him to go and wants  to go to [place]  rehab.  Objective: Temp:  [98.9 F (37.2 C)] 98.9 F (37.2 C) (10/11 0314) Pulse Rate:  [94-98] 98 (10/11 0314) Resp:  [16-18] 16 (10/11 0314) BP: (138-148)/(89-92) 138/89 (10/11 0314) SpO2:  [96 %-99 %] 96 % (10/11 0314) Weight:  [104.8 kg] 104.8 kg (10/11 0314) Physical Exam: General:Elderly male resting comfortably in bed, NAD Cardiovascular:RRR, no murmurs Respiratory:CTAB, no wheezes or rales Abdomen:soft, non-tender, hepatomegaly, +BS Extremities:WWP, 2+ pitting edema, Marked scrotal edema Neuro: alert, conversant  Laboratory: No results for input(s): WBC, HGB, HCT, PLT in the last 168 hours. Recent Labs  Lab 06/26/20 0556 06/27/20 0931  NA 138 139  K 2.8* 3.3*  CL 106 107  CO2 20* 21*  BUN 38* 32*  CREATININE 1.92* 1.86*  CALCIUM 7.6* 7.6*  GLUCOSE 114* 139*     Imaging/Diagnostic Tests: Reviewed prior CT which showed progression of the carcinoid tumor with suggestions of bowel ischemia, mesenteric mass and multiple metastases throughout the liver.  Zola Button, MD 07/02/2020, 8:31 AM PGY-1, [place] Intern pager: [phone], text pages welcome

## 2020-07-02 NOTE — Progress Notes (Signed)
[facility])  Mr. Cabiness does not meet inpatient criteria for residential hospice at this time.   Family does not appear to be aligned with goals of care for Mr. Castrogiovanni.  [facility] is happy to offer hospice services at the home or at a facility, however likely cannot have SNF and hospice concurrently as one would have to be paid out of pocket.  Discussed with [facility] manager this am.  Will continue to follow to assist with d/c planning as needed.  Venia Carbon RN, BSN, [facility] Liaison

## 2020-07-02 NOTE — Progress Notes (Signed)
Spoke with Dr. Benay Spice regarding conversation with Charles Schmidt and [name] this morning. Based on Charles Schmidt's clinical status and significant improvement after being treated for UTI and staph epidermidis bacteremia, he does not think Charles Schmidt would be a good candidate for inpatient hospice and may have a life expectancy of 1 more year.  He stated that Charles Schmidt may be a candidate for home hospice; however, family has been unwilling to take him home.  Charles Schmidt is apparently considering taking him home as a backup.  Ultimately, Dr. Benay Spice and Charles Schmidt decided that SNF would be the best option.  Charles Schmidt agreeable to work with PT and OT.  Charles Schmidt can continue long-acting somatostatin analog at the oncology office outpatient.  PT and OT eval reordered.

## 2020-07-02 NOTE — NC FL2 (Signed)
[place] LEVEL OF CARE SCREENING TOOL     IDENTIFICATION  Patient Name: Charles Schmidt Birthdate: [birth year] Sex: male Admission Date (Current Location): 06/05/2020  [facility] and [place] Number:  Herbalist and Address:  [facility], [place] [address], [place], [place] [address]      Provider Number: [id number]  Attending Physician Name and Address:  Martyn Malay, MD  Relative Name and Phone Number:  [name] [phone]    Current Level of Care: Hospital Recommended Level of Care: [place] Prior Approval Number:    Date Approved/Denied:   PASRR Number: [id number]  Discharge Plan: SNF    Current Diagnoses: Patient Active Problem List   Diagnosis Date Noted  . Staphylococcus epidermidis bacteremia   . Pressure injury of skin 06/12/2020  . Oliguria   . Palliative care by specialist   . Goals of care, counseling/discussion   . DNR (do not resuscitate)   . Acute renal failure ([place])   . Abdominal pain   . Elevated troponin   . Lives alone 06/06/2020  . Nonspecific elevation of levels of transaminase or lactic acid dehydrogenase (LDH) 06/06/2020  . Increased ammonia level 06/06/2020  . Direct hyperbilirubinemia 06/06/2020  . Bowel wall thickening 06/06/2020  . Vomiting, persistent, in adult 06/06/2020  . Anorexia 06/06/2020  . Colitis   . Elevated liver enzymes   . Nausea and vomiting in adult   . Dehydration   . Physical debility 06/05/2020  . Genetic testing 12/18/2017  . Metastatic malignant neuroendocrine tumor to liver ([place]) 10/28/2017  . Essential hypertension (primary) 05/04/2017  . Hypokalemia 05/04/2017  . Benign prostatic hyperplasia 05/04/2017  . Glaucoma 05/04/2017  . Benign hypertensive heart disease without heart failure 05/04/2017  . Microcytic anemia 05/04/2017  . Prediabetes 05/04/2017  . Hypocalcemia 05/04/2017  . Psoriasis 05/04/2017    Orientation RESPIRATION BLADDER Height &  Weight     Self, Situation, Place  Normal Incontinent, External catheter Weight: 231 lb (104.8 kg) Height:  6\' 4"  (193 cm)  BEHAVIORAL SYMPTOMS/MOOD NEUROLOGICAL BOWEL NUTRITION STATUS      Incontinent Diet (see dc summary)  AMBULATORY STATUS COMMUNICATION OF NEEDS Skin   Extensive Assist Verbally Other (Comment) (Pressure Injury scrotum mid stage 2 partial thickness, open red wound due to diarrhea (not pressure injury), Sore on both left and right buttock.)                       Personal Care Assistance Level of Assistance  Bathing, Feeding, Dressing Bathing Assistance: Maximum assistance Feeding assistance: Limited assistance Dressing Assistance: Maximum assistance     Functional Limitations Info  Sight, Hearing, Speech Sight Info: Adequate Hearing Info: Adequate Speech Info: Adequate    SPECIAL CARE FACTORS FREQUENCY  PT (By licensed PT), OT (By licensed OT)     PT Frequency: 5x week OT Frequency: 5x week            Contractures Contractures Info: Not present    Additional Factors Info  Code Status, Allergies Code Status Info: DNR Allergies Info: NKA           Current Medications (07/02/2020):  This is the current hospital active medication list Current Facility-Administered Medications  Medication Dose Route Frequency Provider Last Rate Last Admin  . 0.9 %  sodium chloride infusion   Intravenous PRN Leeanne Rio, MD 10 mL/hr at 06/26/20 0024 250 mL at 06/26/20 0024  . acetaminophen (TYLENOL) tablet 650 mg  650 mg Oral Q6H PRN Delora Fuel, MD   650 mg at 06/21/20 2153  . Chlorhexidine Gluconate Cloth 2 % PADS 6 each  6 each Topical Daily Kinnie Feil, MD   6 each at 07/02/20 1159  . diphenoxylate-atropine (LOMOTIL) 2.5-0.025 MG per tablet 1 tablet  1 tablet Oral QID Owens Shark, NP   1 tablet at 07/02/20 1200  . enoxaparin (LOVENOX) injection 40 mg  40 mg Subcutaneous Q24H Zola Button, MD   40 mg at 07/01/20 2143  . feeding supplement  (BOOST / RESOURCE BREEZE) liquid 1 Container  1 Container Oral TID BM Leeanne Rio, MD   1 Container at 07/02/20 0908  . Gerhardt's butt cream   Topical BID Leeanne Rio, MD   Given at 07/02/20 [phone]  . loperamide (IMODIUM) capsule 2 mg  2 mg Oral PRN Milus Banister C, DO      . MEDLINE mouth rinse  15 mL Mouth Rinse BID Dickie La, MD   15 mL at 07/02/20 0908  . octreotide (SANDOSTATIN) injection 150 mcg  150 mcg Subcutaneous Q6H Martyn Malay, MD   150 mcg at 07/02/20 1200  . ondansetron (ZOFRAN) tablet 4 mg  4 mg Oral Q6H PRN Mullis, Kiersten P, DO       Or  . ondansetron (ZOFRAN) injection 4 mg  4 mg Intravenous Q6H PRN Mullis, Kiersten P, DO   4 mg at 06/19/20 1403  . sodium bicarbonate tablet 1,300 mg  1,300 mg Oral TID Justin Mend, MD   1,300 mg at 07/02/20 6283     Discharge Medications: Please see discharge summary for a list of discharge medications.  Relevant Imaging Results:  Relevant Lab Results:   Additional Information SSN [id number]  Loreta Ave, LCSWA

## 2020-07-02 NOTE — Plan of Care (Signed)

## 2020-07-02 NOTE — NC FL2 (Signed)
[place] LEVEL OF CARE SCREENING TOOL     IDENTIFICATION  Patient Name: Charles Schmidt Birthdate: May 18, 1941 Sex: male Admission Date (Current Location): 06/05/2020  [facility] and [place] Number:  Herbalist and Address:  [facility] [place], [place] [address] [place], [place] [address]      Provider Number: [id number]  Attending Physician Name and Address:  Martyn Malay, MD  Relative Name and Phone Number:  [name] [phone]    Current Level of Care: Hospital Recommended Level of Care: [place] Prior Approval Number:    Date Approved/Denied:   PASRR Number: [id number]  Discharge Plan: SNF    Current Diagnoses: Patient Active Problem List   Diagnosis Date Noted  . Staphylococcus epidermidis bacteremia   . Pressure injury of skin 06/12/2020  . Oliguria   . Palliative care by specialist   . Goals of care, counseling/discussion   . DNR (do not resuscitate)   . Acute renal failure ([place])   . Abdominal pain   . Elevated troponin   . Lives alone 06/06/2020  . Nonspecific elevation of levels of transaminase or lactic acid dehydrogenase (LDH) 06/06/2020  . Increased ammonia level 06/06/2020  . Direct hyperbilirubinemia 06/06/2020  . Bowel wall thickening 06/06/2020  . Vomiting, persistent, in adult 06/06/2020  . Anorexia 06/06/2020  . Colitis   . Elevated liver enzymes   . Nausea and vomiting in adult   . Dehydration   . Physical debility 06/05/2020  . Genetic testing 12/18/2017  . Metastatic malignant neuroendocrine tumor to liver ([place]) 10/28/2017  . Essential hypertension (primary) 05/04/2017  . Hypokalemia 05/04/2017  . Benign prostatic hyperplasia 05/04/2017  . Glaucoma 05/04/2017  . Benign hypertensive heart disease without heart failure 05/04/2017  . Microcytic anemia 05/04/2017  . Prediabetes 05/04/2017  . Hypocalcemia 05/04/2017  . Psoriasis 05/04/2017    Orientation RESPIRATION BLADDER Height &  Weight     Self, Situation, Place  Normal Incontinent, External catheter Weight: 231 lb (104.8 kg) Height:  6\' 4"  (193 cm)  BEHAVIORAL SYMPTOMS/MOOD NEUROLOGICAL BOWEL NUTRITION STATUS      Incontinent Diet (see dc summary)  AMBULATORY STATUS COMMUNICATION OF NEEDS Skin   Extensive Assist Verbally Other (Comment) (Pressure Injury scrotum mid stage 2 partial thickness, open red wound due to diarrhea (not pressure injury), Sore on both left and right buttock.)                       Personal Care Assistance Level of Assistance  Bathing, Feeding, Dressing Bathing Assistance: Maximum assistance Feeding assistance: Limited assistance Dressing Assistance: Maximum assistance     Functional Limitations Info  Sight, Hearing, Speech Sight Info: Adequate Hearing Info: Adequate Speech Info: Adequate    SPECIAL CARE FACTORS FREQUENCY  PT (By licensed PT), OT (By licensed OT)     PT Frequency: 5x week OT Frequency: 5x week            Contractures Contractures Info: Not present    Additional Factors Info  Code Status, Allergies Code Status Info: DNR Allergies Info: NKA           Current Medications (07/02/2020):  This is the current hospital active medication list Current Facility-Administered Medications  Medication Dose Route Frequency Provider Last Rate Last Admin  . 0.9 %  sodium chloride infusion   Intravenous PRN Leeanne Rio, MD 10 mL/hr at 06/26/20 0024 250 mL at 06/26/20 0024  . acetaminophen (TYLENOL) tablet 650 mg  650 mg Oral Q6H PRN Delora Fuel, MD   650 mg at 06/21/20 2153  . Chlorhexidine Gluconate Cloth 2 % PADS 6 each  6 each Topical Daily Kinnie Feil, MD   6 each at 07/02/20 1159  . diphenoxylate-atropine (LOMOTIL) 2.5-0.025 MG per tablet 1 tablet  1 tablet Oral QID Owens Shark, NP   1 tablet at 07/02/20 1200  . enoxaparin (LOVENOX) injection 40 mg  40 mg Subcutaneous Q24H Zola Button, MD   40 mg at 07/01/20 2143  . feeding supplement  (BOOST / RESOURCE BREEZE) liquid 1 Container  1 Container Oral TID BM Leeanne Rio, MD   1 Container at 07/02/20 0908  . Gerhardt's butt cream   Topical BID Leeanne Rio, MD   Given at 07/02/20 [phone]  . loperamide (IMODIUM) capsule 2 mg  2 mg Oral PRN Milus Banister C, DO      . MEDLINE mouth rinse  15 mL Mouth Rinse BID Dickie La, MD   15 mL at 07/02/20 0908  . octreotide (SANDOSTATIN) injection 150 mcg  150 mcg Subcutaneous Q6H Martyn Malay, MD   150 mcg at 07/02/20 1200  . ondansetron (ZOFRAN) tablet 4 mg  4 mg Oral Q6H PRN Mullis, Kiersten P, DO       Or  . ondansetron (ZOFRAN) injection 4 mg  4 mg Intravenous Q6H PRN Mullis, Kiersten P, DO   4 mg at 06/19/20 1403  . sodium bicarbonate tablet 1,300 mg  1,300 mg Oral TID Justin Mend, MD   1,300 mg at 07/02/20 9767     Discharge Medications: Please see discharge summary for a list of discharge medications.  Relevant Imaging Results:  Relevant Lab Results:   Additional Information SSN [id number]  Loreta Ave, LCSWA

## 2020-07-02 NOTE — Progress Notes (Signed)
CSW reached out to all children about pt dc status, no answer, left vm messages. Received phone back from daughter [name], CSW provided update about pt dc today and plan is no longer for appropriate for BP. Provided offers for [facility] and [facility], waiting on family to make a choice. CSW advised [name] that because pt is medically stable for dc, decision would need to be made today. [name] stated that she felt like decision was being rushed but would speak with her siblings. CSW will continue to follow.

## 2020-07-03 ENCOUNTER — Inpatient Hospital Stay (HOSPITAL_COMMUNITY): Payer: Medicare Other

## 2020-07-03 DIAGNOSIS — C7B8 Other secondary neuroendocrine tumors: Secondary | ICD-10-CM | POA: Diagnosis not present

## 2020-07-03 DIAGNOSIS — R609 Edema, unspecified: Secondary | ICD-10-CM

## 2020-07-03 DIAGNOSIS — N179 Acute kidney failure, unspecified: Secondary | ICD-10-CM | POA: Diagnosis not present

## 2020-07-03 DIAGNOSIS — R600 Localized edema: Secondary | ICD-10-CM | POA: Diagnosis not present

## 2020-07-03 MED ORDER — ALUM & MAG HYDROXIDE-SIMETH 200-200-20 MG/5ML PO SUSP: 30.0000 mL | Freq: Four times a day (QID) | ORAL | 0 refills | Status: DC | PRN

## 2020-07-03 MED ORDER — DIPHENOXYLATE-ATROPINE 2.5-0.025 MG PO TABS: 1.0000 | ORAL_TABLET | Freq: Four times a day (QID) | ORAL | 0 refills | Status: AC

## 2020-07-03 MED ORDER — FINASTERIDE 5 MG PO TABS
5.0000 mg | ORAL_TABLET | Freq: Every day | ORAL | Status: DC
  Administered 2020-07-03 – 2020-07-04 (×2): 5 mg via ORAL
  Filled 2020-07-03 (×2): qty 1

## 2020-07-03 MED ORDER — TAMSULOSIN HCL 0.4 MG PO CAPS: 0.4000 mg | ORAL_CAPSULE | Freq: Every day | ORAL | 0 refills | Status: AC

## 2020-07-03 MED ORDER — SODIUM BICARBONATE 650 MG PO TABS: 1300.0000 mg | ORAL_TABLET | Freq: Three times a day (TID) | ORAL | Status: AC

## 2020-07-03 MED ORDER — LATANOPROST 0.005 % OP SOLN
1.0000 [drp] | Freq: Every day | OPHTHALMIC | Status: DC
  Administered 2020-07-03 – 2020-07-04 (×2): 1 [drp] via OPHTHALMIC
  Filled 2020-07-03: qty 2.5

## 2020-07-03 MED ORDER — GERHARDT'S BUTT CREAM: 1.0000 "application " | TOPICAL_CREAM | Freq: Two times a day (BID) | CUTANEOUS | 0 refills | Status: AC

## 2020-07-03 MED ORDER — TAMSULOSIN HCL 0.4 MG PO CAPS
0.4000 mg | ORAL_CAPSULE | Freq: Every day | ORAL | Status: DC
  Administered 2020-07-03 – 2020-07-04 (×2): 0.4 mg via ORAL
  Filled 2020-07-03 (×2): qty 1

## 2020-07-03 MED ORDER — ONDANSETRON HCL 4 MG PO TABS: 4.0000 mg | ORAL_TABLET | Freq: Four times a day (QID) | ORAL | 0 refills | Status: AC | PRN

## 2020-07-03 MED ORDER — ACETAMINOPHEN 325 MG PO TABS: 650.0000 mg | ORAL_TABLET | Freq: Four times a day (QID) | ORAL | Status: AC | PRN

## 2020-07-03 MED ORDER — DORZOLAMIDE HCL-TIMOLOL MAL 2-0.5 % OP SOLN
1.0000 [drp] | Freq: Two times a day (BID) | OPHTHALMIC | Status: DC
  Administered 2020-07-03 – 2020-07-04 (×3): 1 [drp] via OPHTHALMIC
  Filled 2020-07-03: qty 10

## 2020-07-03 MED ORDER — AMLODIPINE BESYLATE 5 MG PO TABS
5.0000 mg | ORAL_TABLET | Freq: Every day | ORAL | Status: DC
  Administered 2020-07-03 – 2020-07-04 (×2): 5 mg via ORAL
  Filled 2020-07-03 (×2): qty 1

## 2020-07-03 MED ORDER — POTASSIUM CHLORIDE 20 MEQ PO PACK
40.0000 meq | PACK | Freq: Once | ORAL | Status: AC
  Administered 2020-07-03: 40 meq via ORAL
  Filled 2020-07-03: qty 2

## 2020-07-03 NOTE — Progress Notes (Signed)
FMTS Brief Note  DVT duplex preliminary negative. Patient evaluated by SLP, diet with thin liquids. Foley removed and replaced. Electrolytes stable,kidney function improving.  Medically stable for discharge on 07/03/2020. If in rare event final read on Duplex was positive, would plan for oral treatment with apixaban, which would be appropriate given clinical condition.   Dorris Singh, MD  Family Medicine Teaching Service

## 2020-07-03 NOTE — Progress Notes (Signed)
OT Cancellation Note  Patient Details Name: Charles Schmidt MRN: [id number] DOB: 1941/04/09   Cancelled Treatment:    Reason Eval/Treat Not Completed: Medical issues which prohibited therapy;Other (comment) Pt noted with planned ultrasound to assess for DVT. Will await results prior to engaging pt in [place] activities for OT eval.   Layla Maw 07/03/2020, 12:05 PM

## 2020-07-03 NOTE — Progress Notes (Signed)
CSW went in and spoke with pt and son Altamese Dilling at bedside, adv that both speech assessment and ultrasound were negative for any issues, advised that pt is medically ready to dc and family would need to decide between [facility] and [facility] since [facility] wouldn't have a bed ready until Thursday. Altamese Dilling stated that he would not be choosing either and would be waiting for [facility] on Thursday. CSW tried to explain that since pt is ready to dc, we wouldn't be able to keep him here if pt's not being treated. Son stated that the family would rather have pt go to [facility] because of their visiting hours. Altamese Dilling stated that they would appeal the dc and wait until bed at [facility] was made available. CSW asked Altamese Dilling what changed from the beginning as CSW offered [facility] as an placement and family adamantly refused option citing someone had a "terrible" experience there and they wanted no parts of the facility. Altamese Dilling stated [facility] is under new management. CSW stated she understood the position the family was put in and would pass this information on to [facility] and MD. Valarie Cones, MD Owens Shark, and [facility] leadership Barbette Or) made aware of family's request to appeal dc.

## 2020-07-03 NOTE — Progress Notes (Signed)
FMTS Brief Note  In to see patient this AM. Son is at bedside. They are deciding between two SNFs. We discussed next steps in care in light in significant change in plan yesterday AM.   Patient has progressive edema documented since 9/24, likely due to volume administration for AKI. Differential must also include DVT given known carcinoma. He has known abdominal ascites, malignancy related. Given edema and change in goals of care, discussed with patient and son. Dr. Nancy Fetter present for discussion. We discussed possible etiologies of edema, including DVT. Discussed treatment, complications, and prognosis. We discussed complications of treatment should the ultrasound be positive. At end of discussion, patient independently voiced he would want a DVT treated if one were present. Will proceed with LE ultrasound. Son in agreement. Other etiologies to consider in CHF, unlikely at this time given known malignant ascites, AKI, fluid resuscitation. He has clear lungs, no S3. CXR without cardiomegaly on 9/30. Albumin on 10/6 was 1.4, which portends a poor prognosis and is also very much contributing to edema/ascites.   Will obtain DVT ultrasound this AM. Pending results, if negative, may be able to be discharged today. Also needs Foley catheter replaced (has been present for 25 days with culture positive in midst of placement), medications restarted for HTN and BPH. Also needs clarification of diet from SLP.   Pending all of the above described evaluation, will evaluate clinical stability for discharge to SNF.  Full note to follow.   Dorris Singh, MD  Family Medicine Teaching Service

## 2020-07-03 NOTE — Discharge Summary (Addendum)
FMTS Attending Daily Note: Dorris Singh, MD  Team Pager [phone] Pager [phone]  I have seen and examined this patient, reviewed their chart. I have discussed this patient with the resident. I agree with the resident's findings, assessment and care plan.  Patient stable for discharge. Family prefers to stay for alternative SNF. Reviewed case with Dr. Nori Riis as well.      [facility] Discharge Summary  Patient name: Joseff Luckman Medical record number: [id number] Date of birth: 17-Oct-1940 Age: 79 y.o. Gender: male Date of Admission: 06/05/2020  Date of Discharge: 07/03/2020  Admitting Physician: Danna Hefty, DO  Primary Care Provider: Nolene Ebbs, MD Consultants: Oncology, palliative care, urology, nephrology  Indication for Hospitalization: Acute renal failure  Discharge Diagnoses/Problem List:  Principal Problem:   Vomiting, persistent, in adult Active Problems:   Metastatic malignant neuroendocrine tumor to liver [facility])   Physical debility   Lives alone   Nonspecific elevation of levels of transaminase or lactic acid dehydrogenase (LDH)   Increased ammonia level   Direct hyperbilirubinemia   Bowel wall thickening   Anorexia   Abdominal pain   Elevated troponin   Palliative care by specialist   Goals of care, counseling/discussion   DNR (do not resuscitate)   Acute renal failure ([place])   Oliguria   Pressure injury of skin   Staphylococcus epidermidis bacteremia    Disposition: SNF  Discharge Condition: Stable  Discharge Exam:  General:Elderly male resting comfortably in bed, NAD Cardiovascular:RRR, no murmurs Respiratory:CTAB, no wheezes or rales Abdomen:soft, non-tender, mildly distended, hepatomegaly, +BS, moderate ascites  GU: Foley catheter in place Extremities:WWP,2+ pitting edema Neuro:alert, conversant   Brief Hospital Course:  Muadh Creasy is a 79 year old male presenting with weakness in the setting of  vomiting and diarrhea.  PMH significant for metastatic carcinoid tumor, BPH s/p laser vaporization and HTN.   ARF/AKI Patient presented with AKI/ARF in the setting of hypotension, contrast exposure, rhabdomyolysis, urinary obstruction secondary to BPH.  During admission patient was found to have rhabdomyolysis, likely secondary to fall (see below).  Patient also received 2 CT scans with IV contrast that could have potentially contributed to AKI, and showed evidence of chronic bladder outlet obstruction with bladder diverticulum.  Nephrology was consulted, patient was determined not to be candidate for hemodialysis.  Kidney function continued to decline with hyperphosphatemia noted even with treatment with phosphate binder Renvela.  Patient became oliguric and had urine output levels between 0.1-0.3 mL/kg/hr. Patient was found to have an anion gap metabolic acidosis with low bicarb, nephrology placed patient on oral bicarb.  With time, patient's kidneys improved with urine output increasing, phosphorus decreasing, creatinine improving.  Patient's Renvela was discontinued. Kidney function improved on discharge. Cr 1.51 prior to discharge.  Bacteremia  UTI During hospital stay, patient developed a fever to 101.6 F on 9/30, workup was started with blood cultures, U/A with culture, CXR, KUB, EKG. Patient was started on broad-spectrum antibiotics. CXR with findings concerning for bilateral pneumonia versus pulmonary edema. Blood cultures grew pansensitive staph epidermidis, and urine culture grew pansensitive E. coli, so patient was then transitioned to IV cefazolin. He completed his antibiotic course and remained afebrile.  Vancomycin (9/30) Cefepime (9/30) Cefazolin (10/1-10/6)  History of metastatic carcinoid tumor, central mesenteric mass with extensive liver mets, stable Patient was admitted for weakness, vomiting, and diarrhea with a history significant for metastatic carcinoid tumor. Patient's  oncologist, Dr. Benay Spice was consulted for recommendations for treatment, especially in the setting of chronic diarrhea with acute  increase in frequency.  Palliative care was involved to aid in goals of care discussion.  Upon discussion with patient and family, decision had been made to pursue inpatient hospice; however, given clinical improvement after being treated for UTI and bacteremia, he no longer met criteria for inpatient hospice, so ultimately decided to pursue SNF.   Of note the patient has malignant ascites, which does contribute to LE edema and modest scrotal edema. Low albumin also contributing.   Diarrhea Patient reported baseline diarrhea with reported average of 4-5 loose bowel movements per day, likely secondary to carcinoid tumor. In the hospital, bowel movements increased and octreotide was increased to 150 mg every 6 hours. GI panel was negative.  Patient improved with increased dose of octreotide, patient noticed improvement. Patient then began having several episodes of diarrhea again with moisture-associated skin damage on the buttocks. Due to profuse watery diarrhea, a rectal tube was placed, which was removed prior to discharge. Overall improved prior to discharge.  Lower extremity edema Patient has had bilateral lower extremity pitting edema throughout admission. Bilateral DVT US was obtained to rule out DVT, preliminary results negative for DVT. This is likely due to fluids given for AKI and low albumin. This is improving. Recommend consideration of Lasix and echocardiogram if not resolved in future.   Hiccups, resolved Patient had several days of intractable hiccups while in the hospital.  Unsure of clear cause possibly from uremia versus cancer.  Patient was trialed on Zanaflex and Haldol with reported improvement but too much sedation.  Patient started Baclofen PRN with improvement, resolved prior to discharge.  Rhabdomyolysis, resolved Patient had a fall prior to  admission with normal creatinine kinase levels on admission. CK levels subsequently rose to >6000 and patient was found to have rhabdomyolysis.  CK levels and AST down trended appropriately with IV fluids.  Urinary retention  History of prostatic hypertrophy status  vaporization, stable Patient has a history of prostatic hypertrophy and is status-post vaporization. During hospitalization, patient developed urinary retention and AKI. A foley was placed and nephrology was consulted. Patient continued to have worsening kidney function and hyperphosphatemia. Foley catheter was kept in for comfort at patient's request. Due to change in dispo plans, Foley was replaced on 10/12 with plan for voiding trial at SNF on 10/18. Medications for BPH were restarted on 10/12 and these should be continued after voiding trial---voiding trial performed in hospital as he has been off for several days.  He will need to follow up with Urology. If foley remains in place, please ensure he has monthly changes at minimum. Would avoid in long term   Hypoxemia, resolved Patient required 2L of oxygen during the middle of his hospital stay due to oxygen saturations dropping to the 80s on room air.  Patient improved without further intervention, required no more oxygen during admission. He had a chest x-ray obtained during evaluation which shows possible atlectasis vs. Infiltrate vs. Edema. This was likely due to edema and improved over time. Repeat CXR in 4-6 weeks.   Elevated troponin, resolved During admission patient noted to have elevated troponin of 156 which increased from 10.  Patient denied chest and abdominal pain, physical exam unremarkable.  At this time patient has elevated CK over 6000, which likely contributed to this elevated troponin. EKG showed RBBB which was similar to prior. He had no chest pain, dyspnea, or signs of HF during admission. Consider echocardiogram as outpatient if he demonstrates cardiac complaints.    Elevated transaminases  hyperbilirubinemia, stable  Patient was noted to have extreme elevations in AST.  Hepatitis panel was negative.  Likely due to hepatic metastatic neuroendocrine tumor, oncologist followed the patient during admission.   Issues for Follow Up:  1. Mental status waxes and wanes at baseline 2. OK for regular diet, thin liquids 3. Will need labs Friday, 10/15 (CBC, renal function panel) 4. Will need voiding trial Monday, 10/18, needs urology follow-up in 2 weeks 5. Patient evaluated by wound nurse, recommended continuing Gerhardt's butt cream 6. Recommend follow-up with nephrology 7. Oncology follow-up for carcinoid tumor, will need to ensure patient attends octreotide infusions so stools are controlled  8. Repeat CXR in 4-6 weeks (end of October, early November) 9. Consider Lasix if edema worsens. He has no pulmonary edema and this is likely related to low albumin.  10. Ensure Urology follow up for at minimum monthly Foley catheter changes if he fails voiding trial. Voiding trial should be completed 10/18.   Significant Procedures: none   Significant Labs and Imaging:  Recent Labs  Lab 07/02/20 0830  WBC 5.2  HGB 9.0*  HCT 26.4*  PLT 274   Recent Labs  Lab 06/27/20 0931 07/02/20 1915  NA 139 141  K 3.3* 3.4*  CL 107 108  CO2 21* 22  GLUCOSE 139* 140*  BUN 32* 23  CREATININE 1.86* 1.51*  CALCIUM 7.6* 7.9*  MG 1.8  --   PHOS 2.6  --   ALBUMIN 1.4*  --      Results/Tests Pending at Time of Discharge: none  Discharge Medications:  Allergies as of 07/03/2020   No Known Allergies     Medication List    STOP taking these medications   hydrochlorothiazide 12.5 MG tablet Commonly known as: HYDRODIURIL   HYDROcodone-acetaminophen 5-325 MG tablet Commonly known as: NORCO/VICODIN   potassium chloride 10 MEQ tablet Commonly known as: KLOR-CON     TAKE these medications   acetaminophen 325 MG tablet Commonly known as: TYLENOL Take 2  tablets (650 mg total) by mouth every 6 (six) hours as needed for moderate pain, fever or headache.   alum & mag hydroxide-simeth 200-200-20 MG/5ML suspension Commonly known as: MAALOX/MYLANTA Take 30 mLs by mouth every 6 (six) hours as needed for indigestion or heartburn.   amLODipine 5 MG tablet Commonly known as: NORVASC Take 1 tablet (5 mg total) by mouth at bedtime.   diphenoxylate-atropine 2.5-0.025 MG tablet Commonly known as: LOMOTIL Take 1 tablet by mouth 4 (four) times daily.   dorzolamide-timolol 22.3-6.8 MG/ML ophthalmic solution Commonly known as: COSOPT Place 1 drop into the left eye 2 (two) times daily.   finasteride 5 MG tablet Commonly known as: PROSCAR Take 5 mg by mouth at bedtime.   Gerhardt's butt cream Crea Apply 1 application topically 2 (two) times daily. Apply to sacrum and scrotum   loperamide 2 MG capsule Commonly known as: IMODIUM Take 2-4 mg by mouth 4 (four) times daily as needed for diarrhea or loose stools.   multivitamin with minerals Tabs tablet Take 1 tablet by mouth at bedtime. Centrum Silver   ondansetron 4 MG tablet Commonly known as: ZOFRAN Take 1 tablet (4 mg total) by mouth every 6 (six) hours as needed for nausea.   SAW PALMETTO PO Take 2 tablets by mouth at bedtime.   sodium bicarbonate 650 MG tablet Take 2 tablets (1,300 mg total) by mouth 3 (three) times daily.   tamsulosin 0.4 MG Caps capsule Commonly known as: FLOMAX Take 1 capsule (0.4 mg total) by  mouth daily after supper. What changed: when to take this   Travoprost (BAK Free) 0.004 % Soln ophthalmic solution Commonly known as: TRAVATAN Place 1 drop into the left eye at bedtime.            Discharge Care Instructions  (From admission, onward)         Start     Ordered   07/03/20 0000  Discharge wound care:       Comments: Continue Gerhardt's butt cream per Wound RN   07/03/20 1623          Discharge Instructions: Please refer to Patient  Instructions section of EMR for full details.  Patient was counseled important signs and symptoms that should prompt return to medical care, changes in medications, dietary instructions, activity restrictions, and follow up appointments.   Follow-Up Appointments:   Martyn Malay, MD 07/03/2020, 6:53 PM PGY-1, [place]

## 2020-07-03 NOTE — Evaluation (Signed)
Clinical/Bedside Swallow Evaluation Patient Details  Name: Charles Schmidt MRN: [id number] Date of Birth: 07-Oct-1940  Today's Date: 07/03/2020 Time: SLP Start Time (ACUTE ONLY): 1330 SLP Stop Time (ACUTE ONLY): 1350 SLP Time Calculation (min) (ACUTE ONLY): 20 min  Past Medical History:  Past Medical History:  Diagnosis Date  . Bladder diverticulum 06/06/2020  . BPH with obstruction/lower urinary tract symptoms   . Cataract, mature    12-28-2017  per pt right eye, legally blind  . Diarrhea concurrent with and due to carcinoid syndrome ([place])    12-28-2017 improvement since started Sandostatin monthly  . ED (erectile dysfunction)   . Elevated PSA   . Family history of cancer in son   . Family history of prostate cancer   . Foley catheter in place   . Glaucoma, left eye   . History of colon polyps   . History of urinary retention    2015; 2016; 01/ 2019  . Hypertension   . Metastatic malignant neuroendocrine tumor to liver [facility]) oncoloigst-  dr Benay Spice   dx 10-23-2017 by liver bx-- WHO grade 2; small bowel mesenteric mass, cirrhosis, mets left lower lobe solitary nodule ,  periaortic and paraspinal lymph nodes METS--  started monthly Sandostatin 11-20-2017  . Microcytic anemia   . RBBB (right bundle branch block with left anterior fascicular block)   . Urinary retention   . Venous reflux    Past Surgical History:  Past Surgical History:  Procedure Laterality Date  . INGUINAL HERNIA REPAIR  1990s   unsure side  . IR US GUIDE BX ASP/DRAIN  10/23/2017  . QUADRICEPS TENDON REPAIR Left 01-08-2011   dr Veverly Fells  . THULIUM LASER TURP (TRANSURETHRAL RESECTION OF PROSTATE) N/A 01/05/2018   Procedure: Charles Schmidt LASER OF THE PROSTATE;  Surgeon: Festus Aloe, MD;  Location: [facility];  Service: Urology;  Laterality: N/A;  . TONSILLECTOMY  child   HPI:  Charles Schmidt is a 79 y.o. male presenting with weakness in the setting of vomiting and diarrhea. PMH is significant for  metastatic carcinoid tumor, BPH s/p laser vaporization, and HTN.  Patient was transitioned from hospice to full care and evaluation of his swallow function was requested prior to discharge to SNF.   Assessment / Plan / Recommendation Clinical Impression  Patient presents with an oropharyngeal swallow that is [facility] and with only a mild decreased mastication efficiency observed. This appears to be related to patient's dentition with some teeth missing and in poor condition. No overt s/s of aspiration or penetration observed with boluses of thin liquids, puree solids, regular solids (crackers). Patient's main complaints are related to diarrhea which he suffes frequently after eating . SLP Visit Diagnosis: Dysphagia, oral phase (R13.11)    Aspiration Risk  No limitations    Diet Recommendation Regular;Thin liquid   Liquid Administration via: Cup;Straw Medication Administration: Whole meds with liquid Supervision: Patient able to self feed Postural Changes: Seated upright at 90 degrees    Other  Recommendations Oral Care Recommendations: Oral care BID;Patient independent with oral care   Follow up Recommendations None      Frequency and Duration     N/A       Prognosis   N/A    Swallow Study   General Date of Onset: 07/03/20 HPI: Charles Schmidt is a 79 y.o. male presenting with weakness in the setting of vomiting and diarrhea. PMH is significant for metastatic carcinoid tumor, BPH s/p laser vaporization, and HTN.  Patient was transitioned from hospice to  full care and evaluation of his swallow function was requested prior to discharge to SNF. Type of Study: Bedside Swallow Evaluation Previous Swallow Assessment: None Diet Prior to this Study: Regular;Thin liquids Temperature Spikes Noted: No Respiratory Status: Room air History of Recent Intubation: No Behavior/Cognition: Alert;Cooperative;Pleasant mood Oral Cavity Assessment: Within Functional Limits Oral Care Completed by SLP:  Other (Comment) (Patient completed oral care after SLP setup) Oral Cavity - Dentition: Missing dentition;Poor condition Vision: Functional for self-feeding Self-Feeding Abilities: Able to feed self;Needs set up Patient Positioning: Upright in bed Baseline Vocal Quality: Normal Volitional Cough: Strong Volitional Swallow: Able to elicit    Oral/Motor/Sensory Function Overall Oral Motor/Sensory Function: Within functional limits   Ice Chips     Thin Liquid Thin Liquid: Within functional limits Presentation: Self Fed;Straw    Nectar Thick     Honey Thick     Puree Puree: Within functional limits Presentation: Spoon   Solid     Solid: Impaired Presentation: Self Fed Oral Phase Functional Implications: Impaired mastication      Sonia Baller, MA, CCC-SLP Speech Therapy [place] [facility] Pager: [phone]

## 2020-07-03 NOTE — Progress Notes (Signed)
Spoke with Dr. Tommy Medal with infectious disease regarding pt's S. Epi bacteremia.  He reports 7 days of Cefazolin was likely adequate treatment. Patient has been afebrile since. States there is no need to explore the valves with a TEE as S. epi doesn't typically seed the valves.   Lyndee Hensen, DO PGY-2, [facility] 07/03/2020 8:32 AM

## 2020-07-03 NOTE — Consult Note (Signed)
[place] Nurse Consult Note: Patient receiving care in [place].  Patient's son in room. Reason for Consult: "sacral, scrotal wounds. Discharging to SNF today" Wound type: The MASD, IAD present on 9/30 has resolved. I could not find any wounds on the scrotum, nor the sacrum. The scrotum was placed on a folded towel and the towel was between the legs and scrotum. I made the patient and his son aware that I could not find any wounds. Continue Gerhardt's butt cream as currently prescribed. Val Riles, RN, MSN, CWOCN, CNS-BC, pager [phone]

## 2020-07-03 NOTE — Progress Notes (Signed)
CSW spoke with daughter of pt, advised that pt was now SNF appropriate, not [facility], daughter req CSW reach out to [facility] and [facility]. CSW reached out to both places, both confirmed they could accept pt, CSW relayed message to daughter [name].

## 2020-07-03 NOTE — Progress Notes (Signed)
Bilateral lower extremity venous duplex completed. Refer to "CV Proc" under chart review to view preliminary results.  07/03/2020 2:50 PM Kelby Aline., MHA, RVT, RDCS, RDMS

## 2020-07-03 NOTE — TOC Progression Note (Addendum)
Transition of Care [facility]) - Progression Note    Patient Details  Name: Charles Schmidt MRN: [id number] Date of Birth: 1940-10-10  Transition of Care [facility]) CM/SW Contact  Zenon Mayo, RN Phone Number: 07/03/2020, 5:51 PM  Clinical Narrative:    NCM  Spoke with [name] at the bedside along with supervisor Denyse Amass, he is going to appeal the discharge.  NCM informed him of the phone number to call to appeal the discharge.  NCM checked back with Staff RN Ava to see if he appealed and she states yes he did call Judithann Graves to appeal. Denyse Amass informed him we will let him know status thru the appeal and in we informed him if the appeal is not up held patient will be responsible to pay for hospital stay starting from day of dc.    10/13- NCM spoke with [name] he said he spoke with [employer] at 5:05 yesterday.  10/13 12:40- still have not received anything from [place] yet. Notified supervisor of this information, also notified Meagan, she will call [employer] to see if she can find anything out.  NCM tried to contact [place] but he did not answer. NCM called him again  he gave me the case number   Expected Discharge Plan: [place] Barriers to Discharge: Barriers Resolved  Expected Discharge Plan and Services Expected Discharge Plan: [place] In-house Referral: Clinical Social Work     Living arrangements for the past 2 months: Single Family Home Expected Discharge Date: 07/03/20                                     Social Determinants of Health (SDOH) Interventions    Readmission Risk Interventions No flowsheet data found.

## 2020-07-03 NOTE — Progress Notes (Addendum)
Family Medicine Teaching Service Daily Progress Note Intern Pager: [phone]  Patient name: Charles Schmidt Medical record number: [id number] Date of birth: 31-Oct-1940 Age: 79 y.o. Gender: male  Primary Care Provider: Nolene Ebbs, MD Consultants: Oncology, palliative care, urology, nephrology   Code Status: DNR  Pt Overview and Major Events to Date:  9/14:Admitted for weakness, vomiting, diarrhea. Oncology consulted 9/15: Extreme elevation in AST, GI and surgery consulted, CTA abd/pelvis obtained 9/16:Developed urinary retention and AKI, foley placed, urology and nephrology consulted 9/17: Worsening kidney function and hyperphosphatemia 9/24: Family meeting with palliative-decision for SNF placement 9/29: Fever of 100.5, 9/30: CXR concerning for b/l PNA, family opted for full scope tx, started on Vanc/Cefepime 10/1: Bld culture (+) staph epidermitis, Urine culture (+) E.coli, transitioned to Cefazolin 10/6 Completed antibiotic course 10/11 Changed dispo plan to SNF  Assessment and Plan: Charles Schmidt is a 79 y.o. male intiallypresentedwith generalized weakness, vomiting, and diarrhea in the setting of rhabdomyolysis and ARF, now withE coli UTI and staph epidermitis bacteremia. PMHx significant IPJ:[id number] carcinoid tumor, BPH s/p laser vaporization, and HTN.  Goals of care Patient with metastatic carcinoid tumor and worsening kidney function.   Upon discussions with patient and family members, ultimately decided on hospice at [facility].  As of 10/11, now planning for SNF after patient discussion with Dr. Benay Spice, oncology.  Will resume normal medical care. - dispo SNF  Lower extremity edema Has had bilateral lower extremity edema throughout admission.  2+ pedal edema on exam this morning.  Will evaluate for DVT. - bilateral DVT US  Acute renal failure Improving. Obstructive uropathy likely contributing, history of BPH. Most recent Cr 1.51. Baseline around 1. - Foley  in place, replace today and plan for voiding trial at SNF - finasteride, tamsulosin restarted - continue bicarb  Hypokalemia K 3.4 this morning. - replete  Bacteremia   UTI Resolved. Blood cultures growing pansensitive staph epidermidis and urine culture growing pansensitive E. coli.  Remains afebrile, last fever 9/30. Completed course of antibiotics. No TEE required per ID. - s/p vanc/cefepime (9/30) - s/p IV cefazolin (10/1-6)  Diarrhea Secondary to carcinoid tumor. Improving. - octreotide - loperamide prn  HTN BP 140/89 this morning. - amlodipine restarted  Sacral, scrotal wounds Previously noted, likely secondary to diarrhea. - WOC nurse consult  FEN/GI: regular diet PPx: enoxaparin (renally dosed)  Disposition: SNF  Subjective:  Patient seen this morning with son, [name] present.  Reports feeling good.  Diarrhea is improved, did not have any episodes last night or this morning.  Discussed plan to replace Foley catheter today and have a voiding trial at SNF, patient and son in agreement.  Family has been deciding on SNF facility.  Objective: Temp:  [98 F (36.7 C)-98.3 F (36.8 C)] 98.3 F (36.8 C) (10/12 0351) Pulse Rate:  [92-95] 95 (10/12 0351) Resp:  [18] 18 (10/12 0351) BP: (140-145)/(84-89) 140/89 (10/12 0351) SpO2:  [92 %-95 %] 92 % (10/12 0351) Weight:  [104.6 kg] 104.6 kg (10/12 0351) Physical Exam: General:Elderly male resting comfortably in bed, NAD Cardiovascular:RRR, no murmurs Respiratory:CTAB, no wheezes or rales Abdomen:soft, non-tender, mildly distended, hepatomegaly, +BS Extremities:WWP, 2+ pitting edema Neuro: alert, conversant  Laboratory: Recent Labs  Lab 07/02/20 0830  WBC 5.2  HGB 9.0*  HCT 26.4*  PLT 274   Recent Labs  Lab 06/27/20 0931 07/02/20 1915  NA 139 141  K 3.3* 3.4*  CL 107 108  CO2 21* 22  BUN 32* 23  CREATININE 1.86* 1.51*  CALCIUM 7.6* 7.9*  GLUCOSE 139* 140*     Imaging/Diagnostic Tests: No new  imaging.  Zola Button, MD 07/03/2020, 7:52 AM PGY-1, [place] Intern pager: [phone], text pages welcome

## 2020-07-03 NOTE — Progress Notes (Addendum)
CSW spoke with daughter and son of pt, now requesting [facility] SNF. CSW reached out to [facility], confirmed bed will be available on Thursday. Family made aware.

## 2020-07-03 NOTE — Care Management Important Message (Signed)
Important Message  Patient Details  Name: Quavon Keisling MRN: [id number] Date of Birth: 08-28-1941   Medicare Important Message Given:  Yes  Previously signed IM presented to the patient .    Betsabe Iglesia 07/03/2020, 4:57 PM

## 2020-07-04 ENCOUNTER — Other Ambulatory Visit: Payer: Self-pay | Admitting: Oncology

## 2020-07-04 DIAGNOSIS — E8809 Other disorders of plasma-protein metabolism, not elsewhere classified: Secondary | ICD-10-CM | POA: Diagnosis not present

## 2020-07-04 DIAGNOSIS — R600 Localized edema: Secondary | ICD-10-CM | POA: Diagnosis not present

## 2020-07-04 DIAGNOSIS — C7B8 Other secondary neuroendocrine tumors: Secondary | ICD-10-CM | POA: Diagnosis not present

## 2020-07-04 NOTE — Progress Notes (Signed)
Physical Therapy Treatment Patient Details Name: Charles Schmidt MRN: [id number] DOB: 1941-04-17 Today's Date: 07/04/2020    History of Present Illness Charles Schmidt is a 79 y.o. male presenting with weakness in the setting of vomiting and diarrhea. PMH is significant for metastatic carcinoid tumor, BPH s/p laser vaporization, and HTN.  Pt with chronic diarrhea.       PT Comments    Pt admitted with above diagnosis. Pt was able to sit EOB for 10 min while attempting to stand to [facility] however pt would not attempt to stand due to ? Fear vs weakness.  Pt given cues, bed raised and demonstration however he would not stand.  Wanted bedpan as he felt urge to have BM and asked to lie back down.   Pt currently with functional limitations due to balance and endurance deficits. Pt will benefit from skilled PT to increase their independence and safety with mobility to allow discharge to the venue listed below.     Follow Up Recommendations  SNF;Supervision/Assistance - 24 hour     Equipment Recommendations  None recommended by PT    Recommendations for Other Services       Precautions / Restrictions Precautions Precautions: Fall Precaution Comments: edematous scrotum and LEs, negative for DVT Restrictions Weight Bearing Restrictions: No    Mobility  Bed Mobility Overal bed mobility: Needs Assistance Bed Mobility: Supine to Sit;Sit to Supine;Rolling Rolling: Modified independent (Device/Increase time)   Supine to sit: Min assist Sit to supine: Mod assist   General bed mobility comments: min assist to sit EOB, cues and min assist for scooting hips to EOB, assist for LEs back into bed, used rail to roll for bed pan placement  Transfers                 General transfer comment: attempted to get pt to stand with stedy, but would not attempt despite elevation of bed, demonstration, verbal cues, and maximum encouragement.  Ambulation/Gait                 Stairs              Wheelchair Mobility    Modified Rankin (Stroke Patients Only)       Balance Overall balance assessment: Needs assistance Sitting-balance support: No upper extremity supported;Feet supported Sitting balance-Leahy Scale: Good Sitting balance - Comments: pt performed sitting EOB with feet supported and B UEs on bed >5 minutes                                     Cognition Arousal/Alertness: Awake/alert Behavior During Therapy: Flat affect;Anxious Overall Cognitive Status: Impaired/Different from baseline Area of Impairment: Following commands;Safety/judgement;Problem solving                       Following Commands: Follows one step commands with increased time Safety/Judgement:  (pt hypersensitive about falling)   Problem Solving: Slow processing;Decreased initiation;Difficulty sequencing;Requires verbal cues General Comments: pt with difficulty understanding use of stedy for standing and transfers despite verbal explanation and demonstration      Exercises      General Comments        Pertinent Vitals/Pain Pain Assessment: Faces Faces Pain Scale: Hurts little more Pain Location: low back, BLEs Pain Descriptors / Indicators: Grimacing;Guarding Pain Intervention(s): Limited activity within patient's tolerance;Monitored during session;Repositioned    Home Living Family/patient expects to be discharged to:: Skilled  nursing facility Living Arrangements: Alone                  Prior Function Level of Independence: Independent with assistive device(s)          PT Goals (current goals can now be found in the care plan section) Acute Rehab PT Goals Patient Stated Goal: to get stronger Progress towards PT goals: Not progressing toward goals - comment (self limiting)    Frequency    Min 2X/week      PT Plan Current plan remains appropriate    Co-evaluation PT/OT/SLP Co-Evaluation/Treatment: Yes Reason for Co-Treatment:  Complexity of the patient's impairments (multi-system involvement);For patient/therapist safety PT goals addressed during session: Mobility/safety with mobility OT goals addressed during session: ADL's and self-care      AM-PAC PT "6 Clicks" Mobility   Outcome Measure  Help needed turning from your back to your side while in a flat bed without using bedrails?: A Lot Help needed moving from lying on your back to sitting on the side of a flat bed without using bedrails?: A Lot Help needed moving to and from a bed to a chair (including a wheelchair)?: Total Help needed standing up from a chair using your arms (e.g., wheelchair or bedside chair)?: Total Help needed to walk in hospital room?: Total Help needed climbing 3-5 steps with a railing? : Total 6 Click Score: 8    End of Session Equipment Utilized During Treatment: Gait belt Activity Tolerance: Patient limited by fatigue (self limiting as pt wanted to use 3N1 but chose bedpan) Patient left: with call bell/phone within reach;in bed;with bed alarm set;with family/visitor present Nurse Communication: Mobility status PT Visit Diagnosis: Muscle weakness (generalized) (M62.81);Unsteadiness on feet (R26.81)     Time: 6294-7654 PT Time Calculation (min) (ACUTE ONLY): 20 min  Charges:  $Therapeutic Activity: 8-22 mins                     Charles Schmidt,PT Acute Rehabilitation Services Pager:  [phone]  Office:  [place] 07/04/2020, 11:04 AM

## 2020-07-04 NOTE — Progress Notes (Signed)
FMTS Brief Note   Into see patient early this a.m.  He reports he has no concerns.  He denies any pain.  Reviewed vitals overnight as well as intake and output.  He reports he ate okay.  Urine output is notable for net output of 2.6 L.  Afebrile overnight and examination is similar to yesterday.  Final read on duplex is negative.  I suspect his lower extremity edema is related to his previous history of venous insufficiency as well as hypoalbuminemia.  I recommended compression stockings which he declined.  I would be hesitant to start scheduled Lasix given his recent pretty significant acute kidney injury, additionally he is urinating over 2 L/day without any diuretic.  Breakfast was being delivered as it exited the room.  Patient mains stable for discharge to skilled nursing.  Full note to follow has it is available.  Dorris Singh, MD  Family Medicine Teaching Service

## 2020-07-04 NOTE — Progress Notes (Addendum)
Spoke with Dr. Learta Codding regarding this patient's octreotide as the selected SNF requested orders for this medication. Pt not going to [facility]. Dr. Learta Codding stated he will put in orders so for the pt can get octreotide infusions at the cancer center. His infusion should be Friday or Monday at the cancer center.  He reports the orders/appoitnments should show up on his discharge summary.   Lyndee Hensen, DO PGY-2, [facility] 07/04/2020 11:56 AM

## 2020-07-04 NOTE — Progress Notes (Signed)
Family Medicine Teaching Service Daily Progress Note Intern Pager: [phone]  Patient name: Charles Schmidt Medical record number: [id number] Date of birth: September 15, 1941 Age: 79 y.o. Gender: male  Primary Care Provider: Nolene Ebbs, MD Consultants: Oncology, palliative care, urology, nephrology   Code Status: DNR  Pt Overview and Major Events to Date:  9/14:Admitted for weakness, vomiting, diarrhea. Oncology consulted 9/15: Extreme elevation in AST, GI and surgery consulted, CTA abd/pelvis obtained 9/16:Developed urinary retention and AKI, foley placed, urology and nephrology consulted 9/17: Worsening kidney function and hyperphosphatemia 9/24: Family meeting with palliative-decision for SNF placement 9/29: Fever of 100.5, 9/30: CXR concerning for b/l PNA, family opted for full scope tx, started on Vanc/Cefepime 10/1: Bld culture (+) staph epidermitis, Urine culture (+) E.coli, transitioned to Cefazolin 10/6 Completed antibiotic course 10/11 Changed dispo plan to SNF 10/12 Discharged, family appealing  Assessment and Plan: Charles Schmidt is a 79 y.o. male intiallypresentedwith generalized weakness, vomiting, and diarrhea in the setting of rhabdomyolysis and ARF as well asE coli UTI and staph epidermidis bacteremia. PMHx significant AGT:XMIWOEHOZY carcinoid tumor, BPH s/p laser vaporization, and HTN. Medically stable for discharge to SNF.  Goals of care Patient with metastatic carcinoid tumor and worsening kidney function.   Initial plan for hospice, but given clinical improvement after treating for bacteremia and UTI, now plan for SNF. - dispo SNF  Lower extremity edema Has had bilateral lower extremity edema throughout admission.  2+ pedal edema on exam this morning. DVT doppler negative, suspect related to history of venous insufficiency and hypoalbuminemia. Patient declines compression stockings.  Acute renal failure Improving. Obstructive uropathy likely contributing,  history of BPH. Most recent Cr 1.51. Baseline around 1. - Foley in place, replace today and plan for voiding trial at SNF - finasteride, tamsulosin restarted - continue bicarb  Bacteremia  UTI Resolved. Blood cultures growing pansensitive staph epidermidis and urine culture growing pansensitive E. coli.  Remains afebrile, last fever 9/30. Completed course of antibiotics. No TEE required per ID. - s/p vanc/cefepime (9/30) - s/p IV cefazolin (10/1-6)  Diarrhea Secondary to carcinoid tumor. Improving. - octreotide - loperamide prn  HTN BP 136/94 this morning. - amlodipine restarted  Sacral, scrotal wounds Previously noted, likely secondary to diarrhea. WOC consulted, appears to be resolved. Recommended continuing Gerhardt's butt cream. - continue Gerhardt's butt cream  FEN/GI: regular diet PPx: enoxaparin (renally dosed)  Disposition: SNF  Subjective:  This morning, patient seen alone.  Feels well without any concerns. Beds were available at two different SNFs, but family declined because they would rather go to [facility] (which has a bed available tomorrow), a change from their prior preferences.  This morning, patient stated that the SNFs did not want him.  Objective: Temp:  [98.4 F (36.9 C)-98.7 F (37.1 C)] 98.4 F (36.9 C) (10/13 0404) Pulse Rate:  [91-95] 95 (10/13 0404) Resp:  [16-20] 16 (10/13 0404) BP: (136-151)/(90-102) 136/94 (10/13 0404) SpO2:  [94 %-98 %] 94 % (10/13 0404) Weight:  [104.5 kg] 104.5 kg (10/13 0404) Physical Exam: General:Elderly male resting comfortably in bed, NAD Cardiovascular:RRR, no murmurs Respiratory:CTAB, no wheezes or rales Abdomen:soft, non-tender, mildly distended, hepatomegaly, +BS Extremities:WWP, 3+ pitting edema Neuro: alert, conversant  Laboratory: Recent Labs  Lab 07/02/20 0830  WBC 5.2  HGB 9.0*  HCT 26.4*  PLT 274   Recent Labs  Lab 06/27/20 0931 07/02/20 1915  NA 139 141  K 3.3* 3.4*  CL 107 108  CO2 21*  22  BUN 32* 23  CREATININE 1.86* 1.51*  CALCIUM 7.6* 7.9*  GLUCOSE 139* 140*     Imaging/Diagnostic Tests: No new imaging.  Zola Button, MD 07/04/2020, 8:53 AM PGY-1, [place] Intern pager: [phone], text pages welcome

## 2020-07-04 NOTE — Care Management Important Message (Signed)
Important Message  Patient Details  Name: Charles Schmidt MRN: [id number] Date of Birth: [birth year]   Medicare Important Message Given:  Yes [employer] Appeal Detailed Notice of Discharge letter created and saved: Yes Detailed Notice of Discharge Document Given to [place]: Yes [employer] ROI Document Created: Yes [employer] appeal documents uploaded to [employer] stite: Yes  Shirley Bolle P Shelia Magallon 07/04/2020, 1:19 PM

## 2020-07-05 ENCOUNTER — Encounter: Payer: Self-pay | Admitting: *Deleted

## 2020-07-05 ENCOUNTER — Telehealth: Payer: Self-pay | Admitting: *Deleted

## 2020-07-05 DIAGNOSIS — R52 Pain, unspecified: Secondary | ICD-10-CM | POA: Diagnosis not present

## 2020-07-05 DIAGNOSIS — N320 Bladder-neck obstruction: Secondary | ICD-10-CM | POA: Diagnosis not present

## 2020-07-05 DIAGNOSIS — Z7189 Other specified counseling: Secondary | ICD-10-CM | POA: Diagnosis not present

## 2020-07-05 DIAGNOSIS — C7B09 Secondary carcinoid tumors of other sites: Secondary | ICD-10-CM | POA: Diagnosis not present

## 2020-07-05 DIAGNOSIS — N401 Enlarged prostate with lower urinary tract symptoms: Secondary | ICD-10-CM | POA: Diagnosis present

## 2020-07-05 DIAGNOSIS — Z789 Other specified health status: Secondary | ICD-10-CM | POA: Diagnosis not present

## 2020-07-05 DIAGNOSIS — R531 Weakness: Secondary | ICD-10-CM | POA: Diagnosis not present

## 2020-07-05 DIAGNOSIS — M6281 Muscle weakness (generalized): Secondary | ICD-10-CM | POA: Diagnosis not present

## 2020-07-05 DIAGNOSIS — B9620 Unspecified Escherichia coli [E. coli] as the cause of diseases classified elsewhere: Secondary | ICD-10-CM | POA: Diagnosis not present

## 2020-07-05 DIAGNOSIS — E861 Hypovolemia: Secondary | ICD-10-CM | POA: Diagnosis present

## 2020-07-05 DIAGNOSIS — R627 Adult failure to thrive: Secondary | ICD-10-CM | POA: Diagnosis present

## 2020-07-05 DIAGNOSIS — Z79899 Other long term (current) drug therapy: Secondary | ICD-10-CM | POA: Diagnosis not present

## 2020-07-05 DIAGNOSIS — R7401 Elevation of levels of liver transaminase levels: Secondary | ICD-10-CM | POA: Diagnosis not present

## 2020-07-05 DIAGNOSIS — N179 Acute kidney failure, unspecified: Secondary | ICD-10-CM | POA: Diagnosis present

## 2020-07-05 DIAGNOSIS — R1111 Vomiting without nausea: Secondary | ICD-10-CM | POA: Diagnosis not present

## 2020-07-05 DIAGNOSIS — N183 Chronic kidney disease, stage 3 unspecified: Secondary | ICD-10-CM | POA: Diagnosis not present

## 2020-07-05 DIAGNOSIS — R7881 Bacteremia: Secondary | ICD-10-CM | POA: Diagnosis not present

## 2020-07-05 DIAGNOSIS — I129 Hypertensive chronic kidney disease with stage 1 through stage 4 chronic kidney disease, or unspecified chronic kidney disease: Secondary | ICD-10-CM | POA: Diagnosis present

## 2020-07-05 DIAGNOSIS — R410 Disorientation, unspecified: Secondary | ICD-10-CM | POA: Diagnosis not present

## 2020-07-05 DIAGNOSIS — I959 Hypotension, unspecified: Secondary | ICD-10-CM | POA: Diagnosis present

## 2020-07-05 DIAGNOSIS — R1084 Generalized abdominal pain: Secondary | ICD-10-CM | POA: Diagnosis not present

## 2020-07-05 DIAGNOSIS — J3489 Other specified disorders of nose and nasal sinuses: Secondary | ICD-10-CM | POA: Diagnosis not present

## 2020-07-05 DIAGNOSIS — G9341 Metabolic encephalopathy: Secondary | ICD-10-CM | POA: Diagnosis not present

## 2020-07-05 DIAGNOSIS — N1831 Chronic kidney disease, stage 3a: Secondary | ICD-10-CM | POA: Diagnosis present

## 2020-07-05 DIAGNOSIS — H409 Unspecified glaucoma: Secondary | ICD-10-CM | POA: Diagnosis not present

## 2020-07-05 DIAGNOSIS — R5381 Other malaise: Secondary | ICD-10-CM | POA: Diagnosis not present

## 2020-07-05 DIAGNOSIS — R338 Other retention of urine: Secondary | ICD-10-CM | POA: Diagnosis present

## 2020-07-05 DIAGNOSIS — C7A8 Other malignant neuroendocrine tumors: Secondary | ICD-10-CM | POA: Diagnosis not present

## 2020-07-05 DIAGNOSIS — R4182 Altered mental status, unspecified: Secondary | ICD-10-CM | POA: Diagnosis not present

## 2020-07-05 DIAGNOSIS — C7A019 Malignant carcinoid tumor of the small intestine, unspecified portion: Secondary | ICD-10-CM | POA: Diagnosis not present

## 2020-07-05 DIAGNOSIS — Z20822 Contact with and (suspected) exposure to covid-19: Secondary | ICD-10-CM | POA: Diagnosis present

## 2020-07-05 DIAGNOSIS — K6389 Other specified diseases of intestine: Secondary | ICD-10-CM | POA: Diagnosis not present

## 2020-07-05 DIAGNOSIS — M2550 Pain in unspecified joint: Secondary | ICD-10-CM | POA: Diagnosis not present

## 2020-07-05 DIAGNOSIS — R630 Anorexia: Secondary | ICD-10-CM | POA: Diagnosis not present

## 2020-07-05 DIAGNOSIS — Z66 Do not resuscitate: Secondary | ICD-10-CM | POA: Diagnosis present

## 2020-07-05 DIAGNOSIS — B965 Pseudomonas (aeruginosa) (mallei) (pseudomallei) as the cause of diseases classified elsewhere: Secondary | ICD-10-CM | POA: Diagnosis present

## 2020-07-05 DIAGNOSIS — C7B8 Other secondary neuroendocrine tumors: Secondary | ICD-10-CM | POA: Diagnosis not present

## 2020-07-05 DIAGNOSIS — R110 Nausea: Secondary | ICD-10-CM | POA: Diagnosis not present

## 2020-07-05 DIAGNOSIS — B028 Zoster with other complications: Secondary | ICD-10-CM | POA: Diagnosis not present

## 2020-07-05 DIAGNOSIS — R180 Malignant ascites: Secondary | ICD-10-CM | POA: Diagnosis present

## 2020-07-05 DIAGNOSIS — R64 Cachexia: Secondary | ICD-10-CM | POA: Diagnosis present

## 2020-07-05 DIAGNOSIS — I4510 Unspecified right bundle-branch block: Secondary | ICD-10-CM | POA: Diagnosis not present

## 2020-07-05 DIAGNOSIS — R066 Hiccough: Secondary | ICD-10-CM | POA: Diagnosis not present

## 2020-07-05 DIAGNOSIS — C7B00 Secondary carcinoid tumors, unspecified site: Secondary | ICD-10-CM | POA: Diagnosis not present

## 2020-07-05 DIAGNOSIS — E876 Hypokalemia: Secondary | ICD-10-CM | POA: Diagnosis present

## 2020-07-05 DIAGNOSIS — R112 Nausea with vomiting, unspecified: Secondary | ICD-10-CM | POA: Diagnosis not present

## 2020-07-05 DIAGNOSIS — R55 Syncope and collapse: Secondary | ICD-10-CM | POA: Diagnosis present

## 2020-07-05 DIAGNOSIS — R7402 Elevation of levels of lactic acid dehydrogenase (LDH): Secondary | ICD-10-CM | POA: Diagnosis not present

## 2020-07-05 DIAGNOSIS — D490 Neoplasm of unspecified behavior of digestive system: Secondary | ICD-10-CM | POA: Diagnosis not present

## 2020-07-05 DIAGNOSIS — I10 Essential (primary) hypertension: Secondary | ICD-10-CM | POA: Diagnosis not present

## 2020-07-05 DIAGNOSIS — E872 Acidosis: Secondary | ICD-10-CM | POA: Diagnosis present

## 2020-07-05 DIAGNOSIS — K529 Noninfective gastroenteritis and colitis, unspecified: Secondary | ICD-10-CM | POA: Diagnosis not present

## 2020-07-05 DIAGNOSIS — K7460 Unspecified cirrhosis of liver: Secondary | ICD-10-CM | POA: Diagnosis not present

## 2020-07-05 DIAGNOSIS — Z515 Encounter for palliative care: Secondary | ICD-10-CM | POA: Diagnosis not present

## 2020-07-05 DIAGNOSIS — R197 Diarrhea, unspecified: Secondary | ICD-10-CM | POA: Diagnosis not present

## 2020-07-05 DIAGNOSIS — A419 Sepsis, unspecified organism: Secondary | ICD-10-CM | POA: Diagnosis not present

## 2020-07-05 DIAGNOSIS — R109 Unspecified abdominal pain: Secondary | ICD-10-CM | POA: Diagnosis not present

## 2020-07-05 DIAGNOSIS — N400 Enlarged prostate without lower urinary tract symptoms: Secondary | ICD-10-CM | POA: Diagnosis not present

## 2020-07-05 DIAGNOSIS — E43 Unspecified severe protein-calorie malnutrition: Secondary | ICD-10-CM | POA: Diagnosis present

## 2020-07-05 DIAGNOSIS — N390 Urinary tract infection, site not specified: Secondary | ICD-10-CM | POA: Diagnosis present

## 2020-07-05 DIAGNOSIS — E340 Carcinoid syndrome: Secondary | ICD-10-CM | POA: Diagnosis present

## 2020-07-05 DIAGNOSIS — Z87440 Personal history of urinary (tract) infections: Secondary | ICD-10-CM | POA: Diagnosis not present

## 2020-07-05 DIAGNOSIS — D61818 Other pancytopenia: Secondary | ICD-10-CM | POA: Diagnosis present

## 2020-07-05 DIAGNOSIS — J9811 Atelectasis: Secondary | ICD-10-CM | POA: Diagnosis not present

## 2020-07-05 DIAGNOSIS — C7B02 Secondary carcinoid tumors of liver: Secondary | ICD-10-CM | POA: Diagnosis present

## 2020-07-05 DIAGNOSIS — D3A8 Other benign neuroendocrine tumors: Secondary | ICD-10-CM | POA: Diagnosis not present

## 2020-07-05 DIAGNOSIS — R1115 Cyclical vomiting syndrome unrelated to migraine: Secondary | ICD-10-CM | POA: Diagnosis not present

## 2020-07-05 DIAGNOSIS — B957 Other staphylococcus as the cause of diseases classified elsewhere: Secondary | ICD-10-CM | POA: Diagnosis not present

## 2020-07-05 DIAGNOSIS — R404 Transient alteration of awareness: Secondary | ICD-10-CM | POA: Diagnosis not present

## 2020-07-05 DIAGNOSIS — D638 Anemia in other chronic diseases classified elsewhere: Secondary | ICD-10-CM | POA: Diagnosis present

## 2020-07-05 DIAGNOSIS — Z7401 Bed confinement status: Secondary | ICD-10-CM | POA: Diagnosis not present

## 2020-07-05 DIAGNOSIS — R34 Anuria and oliguria: Secondary | ICD-10-CM | POA: Diagnosis not present

## 2020-07-05 LAB — SARS CORONAVIRUS 2 BY RT PCR (HOSPITAL ORDER, PERFORMED IN ~~LOC~~ HOSPITAL LAB): SARS Coronavirus 2: NEGATIVE

## 2020-07-05 NOTE — Progress Notes (Signed)
CSW spoke with admissions rep at [facility], she stated that pt's insurance will not be able to cover transportation to pt appt at [facility] tomorrow and there was not enough time to set up transportation as this has to be done when pt arrives at the facility. CSW reached out to [place], son of pt, who stated he could not transport pt to appt but he may be able to pay whatever fee is associated. CSW adv Altamese Dilling to contact SNF. CSW reached out to pt Oncologist to see if pt can get injection here in the hospital, waiting on response. SNF states that they will not be able to transfer pt tomorrow and appt will need to be rescheduled.

## 2020-07-05 NOTE — Discharge Summary (Addendum)
[facility] Discharge Summary  Patient name: Charles Schmidt Medical record number: [id number] Date of birth: [birth year] Age: 79 y.o. Gender: male Date of Admission: 06/05/2020  Date of Discharge: 07/05/2020  Admitting Physician: Danna Hefty, DO  Primary Care Provider: Nolene Ebbs, MD Consultants: Oncology, palliative care, urology, nephrology  Indication for Hospitalization: Acute renal failure  Discharge Diagnoses/Problem List:  Principal Problem:   Vomiting, persistent, in adult Active Problems:   Metastatic malignant neuroendocrine tumor to liver [facility])   Physical debility   Lives alone   Nonspecific elevation of levels of transaminase or lactic acid dehydrogenase (LDH)   Increased ammonia level   Direct hyperbilirubinemia   Bowel wall thickening   Anorexia   Abdominal pain   Elevated troponin   Palliative care by specialist   Goals of care, counseling/discussion   DNR (do not resuscitate)   Acute renal failure ([place])   Oliguria   Pressure injury of skin   Staphylococcus epidermidis bacteremia    Disposition: SNF  Discharge Condition: Stable  Discharge Exam:  General:Elderly male resting comfortably in bed, NAD Cardiovascular:RRR, no murmurs Respiratory:CTAB, no wheezes or rales Abdomen:soft, non-tender, mildly distended, hepatomegaly, +BS, moderate ascites  GU: Foley catheter in place Extremities:WWP,3+ pitting edema Neuro:alert, conversant   Brief Hospital Course:  Charles Schmidt is a 79 year old male presenting with weakness in the setting of vomiting and diarrhea.  PMH significant for metastatic carcinoid tumor, BPH s/p laser vaporization and HTN.   Acute Renal Failure  Patient presented with AKI/ARF in the setting of hypotension, contrast exposure, rhabdomyolysis, urinary obstruction secondary to BPH.  During admission patient was found to have rhabdomyolysis, likely secondary to fall (see below).  Patient also  received 2 CT scans with IV contrast that could have potentially contributed to AKI, and showed evidence of chronic bladder outlet obstruction with bladder diverticulum.  Nephrology was consulted, patient was determined not to be candidate for hemodialysis.  Kidney function continued to decline with hyperphosphatemia noted even with treatment with phosphate binder Renvela.  Patient became oliguric and had urine output levels between 0.1-0.3 mL/kg/hr. Patient was found to have an anion gap metabolic acidosis with low bicarb, nephrology placed patient on oral bicarb.  With time, patient's kidneys improved with urine output increasing, phosphorus decreasing, creatinine improving.  Patient's Renvela was discontinued. Kidney function improved on discharge. Cr 1.51 prior to discharge.  Bacteremia  UTI During hospital stay, patient developed a fever to 101.6 F on 9/30, workup was started with blood cultures, U/A with culture, CXR, KUB, EKG. Patient was started on broad-spectrum antibiotics. CXR with findings concerning for bilateral pneumonia versus pulmonary edema. Blood cultures grew pansensitive staph epidermidis, and urine culture grew pansensitive E. coli, so patient was then transitioned to IV cefazolin. He completed his antibiotic course and remained afebrile.  Vancomycin (9/30) Cefepime (9/30) Cefazolin (10/1-10/6)  History of metastatic carcinoid tumor, central mesenteric mass with extensive liver mets, stable Patient was admitted for weakness, vomiting, and diarrhea with a history significant for metastatic carcinoid tumor. Patient's oncologist, Dr. Benay Spice was consulted for recommendations for treatment, especially in the setting of chronic diarrhea with acute increase in frequency.  Palliative care was involved to aid in goals of care discussion.  Upon discussion with patient and family, decision had been made to pursue inpatient hospice; however, given clinical improvement after being treated for  UTI and bacteremia, he no longer met criteria for inpatient hospice, so ultimately decided to pursue SNF.   Of note the patient has malignant  ascites, which does contribute to LE edema and modest scrotal edema. Low albumin also contributing.   Diarrhea Patient reported baseline diarrhea with reported average of 4-5 loose bowel movements per day, likely secondary to carcinoid tumor. In the hospital, bowel movements increased and octreotide was increased to 150 mg every 6 hours. GI panel was negative.  Patient improved with increased dose of octreotide, patient noticed improvement. Patient then began having several episodes of diarrhea again with moisture-associated skin damage on the buttocks. Due to profuse watery diarrhea, a rectal tube was placed, which was removed prior to discharge. Overall improved prior to discharge.  Lower extremity edema Patient has had bilateral lower extremity pitting edema throughout admission. Bilateral DVT US was negative for DVT. This is likely due to fluids given for AKI and low albumin. This is improving. Recommend consideration of Lasix and echocardiogram if not resolved in future.   Hiccups, resolved Patient had several days of intractable hiccups while in the hospital.  Unsure of clear cause possibly from uremia versus cancer.  Patient was trialed on Zanaflex and Haldol with reported improvement but too much sedation.  Patient started Baclofen PRN with improvement, resolved prior to discharge.  Rhabdomyolysis, resolved Patient had a fall prior to admission with normal creatinine kinase levels on admission. CK levels subsequently rose to >6000 and patient was found to have rhabdomyolysis.  CK levels and AST down trended appropriately with IV fluids.  Urinary retention  History of prostatic hypertrophy status  vaporization, stable Patient has a history of prostatic hypertrophy and is status-post vaporization. During hospitalization, patient developed urinary  retention and AKI. A foley was placed and nephrology was consulted. Patient continued to have worsening kidney function and hyperphosphatemia. Foley catheter was kept in for comfort at patient's request. Due to change in dispo plans, Foley was replaced on 10/12 with plan for voiding trial at SNF on 10/18. Medications for BPH were restarted on 10/12 and these should be continued after voiding trial---voiding trial performed in hospital as he has been off for several days.  He will need to follow up with Urology. If foley remains in place, please ensure he has monthly changes at minimum. Would avoid in long term   Hypoxemia, resolved Patient required 2L of oxygen during the middle of his hospital stay due to oxygen saturations dropping to the 80s on room air.  Patient improved without further intervention, required no more oxygen during admission. He had a chest x-ray obtained during evaluation which shows possible atlectasis vs. Infiltrate vs. Edema. This was likely due to edema and improved over time. Repeat CXR in 4-6 weeks.   Elevated troponin, resolved During admission patient noted to have elevated troponin of 156 which increased from 10.  Patient denied chest and abdominal pain, physical exam unremarkable.  At this time patient has elevated CK over 6000, which likely contributed to this elevated troponin. EKG showed RBBB which was similar to prior. He had no chest pain, dyspnea, or signs of HF during admission. Consider echocardiogram as outpatient if he demonstrates cardiac complaints.   Elevated transaminases  hyperbilirubinemia, stable Patient was noted to have extreme elevations in AST.  Hepatitis panel was negative.  Likely due to hepatic metastatic neuroendocrine tumor, oncologist followed the patient during admission.   Issues for Follow Up:  1. Mental status waxes and wanes at baseline 2. OK for regular diet, thin liquids 3. Will need labs Friday, 10/15 (CBC, renal function  panel) 4. Will need voiding trial Monday, 10/18, needs urology follow-up  in 2 weeks 5. Patient evaluated by wound nurse, recommended continuing Gerhardt's butt cream 6. Recommend follow-up with nephrology 7. Oncology follow-up for carcinoid tumor on 10/15, will need to ensure patient attends octreotide infusions so stools are controlled. Per Dr. Benay Spice: Needs lanreotide injection 10/15 and lab/OV/injection week of 07/30/20. 8. Repeat CXR in 4-6 weeks (end of October, early November) 9. Consider Lasix if edema worsens. He has no pulmonary edema and this is likely related to low albumin.  10. Ensure Urology follow up for at minimum monthly Foley catheter changes if he fails voiding trial. Voiding trial should be completed 10/18.   Significant Procedures: none   Significant Labs and Imaging:  Recent Labs  Lab 07/02/20 0830  WBC 5.2  HGB 9.0*  HCT 26.4*  PLT 274   Recent Labs  Lab 07/02/20 1915  NA 141  K 3.4*  CL 108  CO2 22  GLUCOSE 140*  BUN 23  CREATININE 1.51*  CALCIUM 7.9*     Results/Tests Pending at Time of Discharge: none  Discharge Medications:  Allergies as of 07/05/2020   No Known Allergies     Medication List    STOP taking these medications   hydrochlorothiazide 12.5 MG tablet Commonly known as: HYDRODIURIL   HYDROcodone-acetaminophen 5-325 MG tablet Commonly known as: NORCO/VICODIN   potassium chloride 10 MEQ tablet Commonly known as: KLOR-CON     TAKE these medications   acetaminophen 325 MG tablet Commonly known as: TYLENOL Take 2 tablets (650 mg total) by mouth every 6 (six) hours as needed for moderate pain, fever or headache.   alum & mag hydroxide-simeth 200-200-20 MG/5ML suspension Commonly known as: MAALOX/MYLANTA Take 30 mLs by mouth every 6 (six) hours as needed for indigestion or heartburn.   amLODipine 5 MG tablet Commonly known as: NORVASC Take 1 tablet (5 mg total) by mouth at bedtime.   diphenoxylate-atropine 2.5-0.025 MG  tablet Commonly known as: LOMOTIL Take 1 tablet by mouth 4 (four) times daily.   dorzolamide-timolol 22.3-6.8 MG/ML ophthalmic solution Commonly known as: COSOPT Place 1 drop into the left eye 2 (two) times daily.   finasteride 5 MG tablet Commonly known as: PROSCAR Take 5 mg by mouth at bedtime.   Gerhardt's butt cream Crea Apply 1 application topically 2 (two) times daily. Apply to sacrum and scrotum   loperamide 2 MG capsule Commonly known as: IMODIUM Take 2-4 mg by mouth 4 (four) times daily as needed for diarrhea or loose stools.   multivitamin with minerals Tabs tablet Take 1 tablet by mouth at bedtime. Centrum Silver   ondansetron 4 MG tablet Commonly known as: ZOFRAN Take 1 tablet (4 mg total) by mouth every 6 (six) hours as needed for nausea.   SAW PALMETTO PO Take 2 tablets by mouth at bedtime.   sodium bicarbonate 650 MG tablet Take 2 tablets (1,300 mg total) by mouth 3 (three) times daily.   tamsulosin 0.4 MG Caps capsule Commonly known as: FLOMAX Take 1 capsule (0.4 mg total) by mouth daily after supper. What changed: when to take this   Travoprost (BAK Free) 0.004 % Soln ophthalmic solution Commonly known as: TRAVATAN Place 1 drop into the left eye at bedtime.            Discharge Care Instructions  (From admission, onward)         Start     Ordered   07/03/20 0000  Discharge wound care:       Comments: Continue Gerhardt's butt cream per  Wound RN   07/03/20 1623          Discharge Instructions: Please refer to Patient Instructions section of EMR for full details.  Patient was counseled important signs and symptoms that should prompt return to medical care, changes in medications, dietary instructions, activity restrictions, and follow up appointments.   Follow-Up Appointments:  Follow-up Information    [place]. Go on 07/06/2020.   Why: Per Dr. Learta Codding: lanreotide acetate (SOMATULINE DEPOT) injection 120 mg on  07/06/20              Lyndee Hensen, DO 07/05/2020, 9:03 AM PGY-1, [place]   Lyndee Hensen, DO PGY-2, [facility] 07/05/2020 9:03 AM

## 2020-07-05 NOTE — Progress Notes (Signed)
D/C instructions printed and placed in packet at nurse's station for D/C. IV removed, tolerated well.

## 2020-07-05 NOTE — Discharge Instructions (Signed)
Dear Charles Schmidt,   Thank you so much for allowing [place] to be part of your care!  You were admitted to [facility] for acute kidney failure.  A Foley catheter was placed due to urinary retention.  You were also found to have an infection in your bloodstream and a urinary tract infection, so you were treated with antibiotics.   POST-HOSPITAL & CARE INSTRUCTIONS 1. You will get blood work on Friday, 10/15 at your facility. 2. We will try to have your Foley catheter removed next week. 3. Please follow-up with Dr. Benay Spice, your oncologist, as scheduled. 4. We recommend following up with your urologist in 2 weeks. 5. We recommend following up with a nephrologist. 6. Please let PCP/Specialists know of any changes that were made.  7. Please see medications section of this packet for any medication changes.   DOCTOR'S APPOINTMENT & FOLLOW UP CARE INSTRUCTIONS  Future Appointments  Date Time Provider [place]  07/30/2020 11:45 AM [facility] LAB [facility] None  07/30/2020 12:15 PM Owens Shark, NP [facility] None  07/30/2020  1:00 PM [facility] FLUSH [facility] None    Take care and be well!  [facility]  [address], [place] [address] [phone]

## 2020-07-05 NOTE — TOC Progression Note (Signed)
Transition of Care [facility]) - Progression Note    Patient Details  Name: Jessiah Steinhart MRN: [id number] Date of Birth: 1941/03/30  Transition of Care [facility]) CM/SW Contact  Zenon Mayo, RN Phone Number: 07/05/2020, 9:24 AM  Clinical Narrative:    [name] has called Rosalyn Gess to cancel the appeal.   Expected Discharge Plan: [place] Barriers to Discharge: Barriers Resolved  Expected Discharge Plan and Services Expected Discharge Plan: [place] In-house Referral: Clinical Social Work     Living arrangements for the past 2 months: Single Family Home Expected Discharge Date: 07/03/20                                     Social Determinants of Health (SDOH) Interventions    Readmission Risk Interventions No flowsheet data found.

## 2020-07-05 NOTE — Progress Notes (Unsigned)
Per Dr. Benay Spice: Needs lanreotide injection 10/15 and lab/OV/injection week of 07/30/20. High priority scheduling message sent with request to call daughter w/appointments.

## 2020-07-05 NOTE — Progress Notes (Signed)
Family Medicine Teaching Service Daily Progress Note Intern Pager: [phone]  Patient name: Charles Schmidt Medical record number: [id number] Date of birth: 08/12/1941 Age: 79 y.o. Gender: male  Primary Care Provider: Nolene Ebbs, MD Consultants: Oncology, palliative care, urology, nephrology   Code Status: DNR  Pt Overview and Major Events to Date:  9/14:Admitted for weakness, vomiting, diarrhea. Oncology consulted 9/15: Extreme elevation in AST, GI and surgery consulted, CTA abd/pelvis obtained 9/16:Developed urinary retention and AKI, foley placed, urology and nephrology consulted 9/17: Worsening kidney function and hyperphosphatemia 9/24: Family meeting with palliative-decision for SNF placement 9/29: Fever of 100.5, 9/30: CXR concerning for b/l PNA, family opted for full scope tx, started on Vanc/Cefepime 10/1: Bld culture (+) staph epidermitis, Urine culture (+) E.coli, transitioned to Cefazolin 10/6 Completed antibiotic course 10/11 Changed dispo plan to SNF 10/12 Discharged, family appealing  Assessment and Plan: Charles Schmidt is a 79 y.o. male intiallypresentedwith generalized weakness, vomiting, and diarrhea in the setting of rhabdomyolysis and ARF as well asE coli UTI and staph epidermidis bacteremia. PMHx significant OIB:[id number] carcinoid tumor, BPH s/p laser vaporization, and HTN. Medically stable for discharge to SNF.  Goals of care Patient with metastatic carcinoid tumor and worsening kidney function.   Initial plan for hospice, but given clinical improvement after treating for bacteremia and UTI, now plan for SNF. - dispo SNF  Lower extremity edema Has had bilateral lower extremity edema throughout admission. DVT doppler negative, suspect related to history of venous insufficiency and hypoalbuminemia. Patient declines compression stockings.  Acute renal failure Improving. Obstructive uropathy likely contributing, history of BPH. Most recent Cr 1.51.  Baseline around 1. - Foley in place, replace today and plan for voiding trial at SNF - finasteride, tamsulosin restarted - continue bicarb  Bacteremia  UTI Resolved. Blood cultures growing pansensitive staph epidermidis and urine culture growing pansensitive E. coli.  Remains afebrile, last fever 9/30. Completed course of antibiotics. No TEE required per ID. - s/p vanc/cefepime (9/30) - s/p IV cefazolin (10/1-6)  Diarrhea Secondary to carcinoid tumor. Improving. - octreotide - loperamide prn - oncology will arrange SST-analog outpatient  HTN Normotensive. - continue amlodipine  Sacral, scrotal wounds Previously noted, likely secondary to diarrhea. WOC consulted, appears to be resolved. Recommended continuing Gerhardt's butt cream. - continue Gerhardt's butt cream  FEN/GI: regular diet PPx: enoxaparin (renally dosed)  Disposition: SNF  Subjective:  Patient seen alone this morning.  Feels good, no concerns.  Leg swelling does not bother him.  Looking forward to getting out of the hospital today.  Objective: Temp:  [98.4 F (36.9 C)-99 F (37.2 C)] 98.4 F (36.9 C) (10/14 0338) Pulse Rate:  [88-94] 94 (10/14 0338) Resp:  [16-20] 18 (10/14 0338) BP: (135-148)/(89-99) 135/89 (10/14 0338) SpO2:  [94 %-99 %] 94 % (10/14 0338) Weight:  [104.3 kg] 104.3 kg (10/14 0338) Physical Exam: General:Elderly male resting comfortably in bed, NAD Cardiovascular:RRR, no murmurs Respiratory:CTAB, no wheezes or rales Abdomen:soft, non-tender, mildly distended, hepatomegaly, +BS Extremities:WWP, 3+ pitting edema Neuro: alert, conversant  Laboratory: Recent Labs  Lab 07/02/20 0830  WBC 5.2  HGB 9.0*  HCT 26.4*  PLT 274   Recent Labs  Lab 07/02/20 1915  NA 141  K 3.4*  CL 108  CO2 22  BUN 23  CREATININE 1.51*  CALCIUM 7.9*  GLUCOSE 140*     Imaging/Diagnostic Tests: No new imaging.  Zola Button, MD 07/05/2020, 8:36 AM PGY-1, [employer] Intern pager: [phone], text pages welcome

## 2020-07-05 NOTE — Telephone Encounter (Signed)
Admission coordinator at SNF left VM requesting a return call. Called back and left her VM with his injection appointment for 10/18 and f/u 11/8 and that family is aware.

## 2020-07-05 NOTE — Progress Notes (Signed)
Report was attempted to be given. I spoke with April and was transferred twice with no answer.

## 2020-07-05 NOTE — Plan of Care (Signed)
  Problem: Education: Goal: Knowledge of General Education information will improve Description: Including pain rating scale, medication(s)/side effects and non-pharmacologic comfort measures Outcome: Adequate for Discharge   Problem: Health Behavior/Discharge Planning: Goal: Ability to manage health-related needs will improve Outcome: Adequate for Discharge   Problem: Clinical Measurements: Goal: Ability to maintain clinical measurements within normal limits will improve Outcome: Adequate for Discharge Goal: Will remain free from infection Outcome: Adequate for Discharge Goal: Diagnostic test results will improve Outcome: Adequate for Discharge Goal: Respiratory complications will improve Outcome: Adequate for Discharge Goal: Cardiovascular complication will be avoided Outcome: Adequate for Discharge   Problem: Activity: Goal: Risk for activity intolerance will decrease Outcome: Adequate for Discharge   Problem: Nutrition: Goal: Adequate nutrition will be maintained Outcome: Adequate for Discharge   Problem: Coping: Goal: Level of anxiety will decrease Outcome: Adequate for Discharge   Problem: Elimination: Goal: Will not experience complications related to bowel motility Outcome: Adequate for Discharge Goal: Will not experience complications related to urinary retention Outcome: Adequate for Discharge   Problem: Safety: Goal: Ability to remain free from injury will improve Outcome: Adequate for Discharge   Problem: Skin Integrity: Goal: Risk for impaired skin integrity will decrease Outcome: Adequate for Discharge

## 2020-07-05 NOTE — TOC Transition Note (Signed)
Transition of Care [facility]) - CM/SW Discharge Note   Patient Details  Name: Charles Schmidt MRN: [id number] Date of Birth: 07/08/1941  Transition of [facility] CM/SW Contact:  [address], [place] Phone Number: 07/05/2020, 10:00 AM   Clinical Narrative:    Patient will DC to: [facility] Anticipated DC date: 07/05/20 Family notified: Lacretia Nicks Transport by: Corey Harold   Per MD patient ready for DC to [facility]. RN to call report prior to discharge [phone]. RN, patient, patient's family, and facility notified of DC. Discharge Summary and FL2 sent to facility. DC packet on chart. Ambulance transport requested for patient.   CSW will sign off for now as social work intervention is no longer needed. Please consult [place] again if new needs arise.     Final next level of care: Skilled Nursing Facility Barriers to Discharge: Barriers Resolved   Patient Goals and CMS Choice Patient states their goals for this hospitalization and ongoing recovery are:: Get better soon CMS Medicare.gov Compare Post Acute Care list provided to:: Patient Represenative (must comment) (Daughter [name]) Choice offered to / list presented to : Adult Children  Discharge Placement PASRR number recieved: 07/02/20            Patient [facility] bed at: [facility] Patient to be transferred to facility by: [place] Name of family member notified: [name] Patient and family notified of of transfer: 07/05/20  Discharge Plan and Services In-house Referral: Clinical Social Work                                   Social Determinants of Health ([place]) Interventions     Readmission Risk Interventions No flowsheet data found.

## 2020-07-06 DIAGNOSIS — C7A8 Other malignant neuroendocrine tumors: Secondary | ICD-10-CM | POA: Diagnosis not present

## 2020-07-06 DIAGNOSIS — R5381 Other malaise: Secondary | ICD-10-CM | POA: Diagnosis not present

## 2020-07-06 DIAGNOSIS — K529 Noninfective gastroenteritis and colitis, unspecified: Secondary | ICD-10-CM | POA: Diagnosis not present

## 2020-07-06 DIAGNOSIS — R197 Diarrhea, unspecified: Secondary | ICD-10-CM | POA: Diagnosis not present

## 2020-07-06 DIAGNOSIS — R1084 Generalized abdominal pain: Secondary | ICD-10-CM | POA: Diagnosis not present

## 2020-07-09 ENCOUNTER — Inpatient Hospital Stay: Payer: No Typology Code available for payment source | Attending: Oncology

## 2020-07-09 ENCOUNTER — Other Ambulatory Visit: Payer: Self-pay

## 2020-07-09 VITALS — BP 133/92 | HR 94 | Resp 16

## 2020-07-09 DIAGNOSIS — C7B02 Secondary carcinoid tumors of liver: Secondary | ICD-10-CM | POA: Insufficient documentation

## 2020-07-09 DIAGNOSIS — Z79899 Other long term (current) drug therapy: Secondary | ICD-10-CM | POA: Insufficient documentation

## 2020-07-09 DIAGNOSIS — C7A019 Malignant carcinoid tumor of the small intestine, unspecified portion: Secondary | ICD-10-CM | POA: Diagnosis not present

## 2020-07-09 DIAGNOSIS — B028 Zoster with other complications: Secondary | ICD-10-CM | POA: Insufficient documentation

## 2020-07-09 DIAGNOSIS — C7B8 Other secondary neuroendocrine tumors: Secondary | ICD-10-CM

## 2020-07-09 DIAGNOSIS — D61818 Other pancytopenia: Secondary | ICD-10-CM | POA: Diagnosis not present

## 2020-07-09 DIAGNOSIS — N400 Enlarged prostate without lower urinary tract symptoms: Secondary | ICD-10-CM | POA: Diagnosis not present

## 2020-07-09 MED ORDER — LANREOTIDE ACETATE 120 MG/0.5ML ~~LOC~~ SOLN
120.0000 mg | Freq: Once | SUBCUTANEOUS | Status: AC
  Administered 2020-07-09: 120 mg via SUBCUTANEOUS
  Filled 2020-07-09: qty 120

## 2020-07-09 NOTE — Patient Instructions (Signed)
Lanreotide injection What is this medicine? LANREOTIDE (lan REE oh tide) is used to reduce blood levels of growth hormone in patients with a condition called acromegaly. It also works to slow or stop tumor growth in patients with neuroendocrine tumors and treat carcinoid syndrome. This medicine may be used for other purposes; ask your health care provider or pharmacist if you have questions. COMMON BRAND NAME(S): Somatuline [place] What should I tell my health care provider before I take this medicine? They need to know if you have any of these conditions:  diabetes  gallbladder disease  heart disease  kidney disease  liver disease  thyroid disease  an unusual or allergic reaction to lanreotide, other medicines, foods, dyes, or preservatives  pregnant or trying to get pregnant  breast-feeding How should I use this medicine? This medicine is for injection under the skin. It is given by a health care professional in a hospital or clinic setting. Contact your pediatrician or health care professional regarding the use of this medicine in children. Special care may be needed. Overdosage: If you think you have taken too much of this medicine contact a poison control center or emergency room at once. NOTE: This medicine is only for you. Do not share this medicine with others. What if I miss a dose? It is important not to miss your dose. Call your doctor or health care professional if you are unable to keep an appointment. What may interact with this medicine? This medicine may interact with the following medications:  bromocriptine  cyclosporine  certain medicines for blood pressure, heart disease, irregular heart beat  certain medicines for diabetes  quinidine  terfenadine This list may not describe all possible interactions. Give your health care provider a list of all the medicines, herbs, non-prescription drugs, or dietary supplements you use. Also tell them if you smoke,  drink alcohol, or use illegal drugs. Some items may interact with your medicine. What should I watch for while using this medicine? Tell your doctor or healthcare professional if your symptoms do not start to get better or if they get worse. Visit your doctor or health care professional for regular checks on your progress. Your condition will be monitored carefully while you are receiving this medicine. This medicine may increase blood sugar. Ask your healthcare provider if changes in diet or medicines are needed if you have diabetes. You may need blood work done while you are taking this medicine. Women should inform their doctor if they wish to become pregnant or think they might be pregnant. There is a potential for serious side effects to an unborn child. Talk to your health care professional or pharmacist for more information. Do not breast-feed an infant while taking this medicine or for 6 months after stopping it. This medicine has caused ovarian failure in some women. This medicine may interfere with the ability to have a child. Talk with your doctor or health care professional if you are concerned about your fertility. What side effects may I notice from receiving this medicine? Side effects that you should report to your doctor or health care professional as soon as possible:  allergic reactions like skin rash, itching or hives, swelling of the face, lips, or tongue  increased blood pressure  severe stomach pain  signs and symptoms of hgh blood sugar such as being more thirsty or hungry or having to urinate more than normal. You may also feel very tired or have blurry vision.  signs and symptoms of low blood   sugar such as feeling anxious; confusion; dizziness; increased hunger; unusually weak or tired; sweating; shakiness; cold; irritable; headache; blurred vision; fast heartbeat; loss of consciousness  unusually slow heartbeat Side effects that usually do not require medical  attention (report to your doctor or health care professional if they continue or are bothersome):  constipation  diarrhea  dizziness  headache  muscle pain  muscle spasms  nausea  pain, redness, or irritation at site where injected This list may not describe all possible side effects. Call your doctor for medical advice about side effects. You may report side effects to FDA at 1-800-FDA-1088. Where should I keep my medicine? This drug is given in a hospital or clinic and will not be stored at home. NOTE: This sheet is a summary. It may not cover all possible information. If you have questions about this medicine, talk to your doctor, pharmacist, or health care provider.  2020 Elsevier/Gold Standard (2018-06-17 09:13:08)  

## 2020-07-10 DIAGNOSIS — K529 Noninfective gastroenteritis and colitis, unspecified: Secondary | ICD-10-CM | POA: Diagnosis not present

## 2020-07-10 DIAGNOSIS — R197 Diarrhea, unspecified: Secondary | ICD-10-CM | POA: Diagnosis not present

## 2020-07-10 DIAGNOSIS — R1084 Generalized abdominal pain: Secondary | ICD-10-CM | POA: Diagnosis not present

## 2020-07-10 DIAGNOSIS — C7A8 Other malignant neuroendocrine tumors: Secondary | ICD-10-CM | POA: Diagnosis not present

## 2020-07-10 DIAGNOSIS — R5381 Other malaise: Secondary | ICD-10-CM | POA: Diagnosis not present

## 2020-07-12 ENCOUNTER — Other Ambulatory Visit: Payer: Self-pay | Admitting: *Deleted

## 2020-07-12 NOTE — Patient Outreach (Signed)
Member screened for potential [facility] Care Management needs as a benefit of [place] [employer].  [name] is receiving skilled therapy at [facility]. Update received from SNF SW indicating member's transition plan is to go home with [name].   Will continue to follow for transition plans and for potential [facility] Care Management needs. Advised by [facility] Department Director that member can be followed by [facility] services. PCP is Dr. Jeanie Schmidt. However, member is seen by specialists in [facility] network.   Inquiry made to facility SW about palliative care following member at [facility].    Charles Rolling, MSN-Ed, RN,BSN [place] Acute Care Coordinator [phone] [facility]) [phone]  (Toll free office)

## 2020-07-13 DIAGNOSIS — C7B00 Secondary carcinoid tumors, unspecified site: Secondary | ICD-10-CM | POA: Diagnosis not present

## 2020-07-13 DIAGNOSIS — R531 Weakness: Secondary | ICD-10-CM | POA: Diagnosis not present

## 2020-07-13 DIAGNOSIS — R197 Diarrhea, unspecified: Secondary | ICD-10-CM | POA: Diagnosis not present

## 2020-07-20 ENCOUNTER — Non-Acute Institutional Stay: Payer: Medicare Other | Admitting: Hospice

## 2020-07-20 ENCOUNTER — Other Ambulatory Visit: Payer: Self-pay

## 2020-07-20 DIAGNOSIS — C7B8 Other secondary neuroendocrine tumors: Secondary | ICD-10-CM

## 2020-07-20 DIAGNOSIS — Z515 Encounter for palliative care: Secondary | ICD-10-CM

## 2020-07-20 NOTE — Progress Notes (Signed)
Charles Schmidt Consult Note Telephone: [phone]  Fax: [phone]  PATIENT NAME: Charles Schmidt [address] [place] [address] [phone] (home)  DOB: July 19, 1941 MRN: [id number]  PRIMARY CARE PROVIDER:    Nolene Ebbs, MD,  [place] [address] [phone]  REFERRING PROVIDER: Martinique Blattenberger, NP  RESPONSIBLE PARTY: Self  Extended Emergency Contact Information Primary Emergency Contact: Schmidt, Charles [place] Phone: [phone] Relation: Son Secondary Emergency Contact: York Pellant [place] Phone: [phone] Relation: Daughter  I met face to face with patient and family in home/facility.  RECOMMENDATIONS/PLAN:    Visit at the request of Martinique Blattenberger, NP  for palliative consult. Visit consisted of building trust and discussions on Palliative Medicine as specialized medical care for people living with serious illness, aimed at facilitating better quality of life through symptoms relief, assisting with advance care plan and establishing goals of care.  Daughter Charles Schmidt was present during visit.  Discussion on the difference between Palliative and Hospice care. Palliative care and hospice have similar goals of managing symptoms, promoting comfort, improving quality of life, and maintaining a person's dignity. However, palliative care may be offered during any phase of a serious illness, while hospice care is usually offered when a person is expected to live for 6 months or less.  Advance Care Planning: Our advance care planning conversation included a discussion about:    The value and importance of advance care planning  Experiences with loved ones who have been seriously ill or have died  Exploration of personal, cultural or spiritual beliefs that might influence medical decisions  Exploration of goals of care in the event of a sudden injury or  illness  Identification and preparation of a healthcare agent  Review and updating or creation of an  advance directive document  CODE STATUS: CODE STATUS reviewed.  Patient is a DO NOT RESUSCITATE.  Signed DNR form in facility chart; same document uploaded to epic today.  GOALS OF CARE: Goals of care include to maximize quality of life and symptom management.  Patient wishes to walk and regain his independence.  Follow up Palliative Care Visit: Palliative care will continue to follow for goals of care clarification and symptom management.  Follow-up in a month/as needed  Functional decline/Symptom Management:  Fatigue: Patient is currently on ambulatory related to physical debility.  Recent hospitalization 9/14 to 10/14/2021For vomiting/diarrhea with history of metastatic carcinoid tumor.  While in the hospital he was also treated for pneumonia and discharged to SNF for rehab.  PT OT is ongoing.  Patient is supervision for bed mobility, supine to sit sit to supine; he is to person assist to stand.  PT said patient was able to one place matching today. Abdominal pain/diarrhea/vomiting: Patient denies abdominal pain; no vomiting today: Diarrhea is currently managed with Lomotil,  Imodium.   Patient continues with monthly Laanreotide injection for metastatic neuroendocrine tumor to liver.  Patient on regular diet-regular texture, regular liquids consistency; appetite is fair to good. Palliative will continue to monitor for symptom management/decline and make recommendations as needed.   Family /Caregiver/Community Supports: Patient in SNF for rehab.  He has family members who are involved in his care.  Charles Schmidt was visiting patient during this consult.  I spent 1 hour and 46 minutes providing this initial consultation; time includes time spent with patient/family, chart review, provider coordination,  and documentation. More than 50% of the time  in this consultation was spent on coordinating  communication  CHIEF COMPLAIN/HISTORY OF PRESENT ILLNESS:  Charles Schmidt is a 79 y.o. male with multiple medical problems including Metastatic malignant neuroendocrine tumor to liver, noninfective gastroenteritis and colitis, hypertension. Palliative Care was asked to follow this patient by consultation request of Martinique Blattenberger, NP to help address advance care planning and goals of care.  CODE STATUS: DO NOT RESUSCITATE  PPS: 40%  HOSPICE ELIGIBILITY/DIAGNOSIS: TBD  PAST MEDICAL HISTORY:  Past Medical History:  Diagnosis Date  . Bladder diverticulum 06/06/2020  . BPH with obstruction/lower urinary tract symptoms   . Cataract, mature    12-28-2017  per pt right eye, legally blind  . Diarrhea concurrent with and due to carcinoid syndrome ([place])    12-28-2017 improvement since started Sandostatin monthly  . ED (erectile dysfunction)   . Elevated PSA   . Family history of cancer in son   . Family history of prostate cancer   . Foley catheter in place   . Glaucoma, left eye   . History of colon polyps   . History of urinary retention    2015; 2016; 01/ 2019  . Hypertension   . Metastatic malignant neuroendocrine tumor to liver [facility]) oncoloigst-  dr Benay Spice   dx 10-23-2017 by liver bx-- WHO grade 2; small bowel mesenteric mass, cirrhosis, mets left lower lobe solitary nodule ,  periaortic and paraspinal lymph nodes METS--  started monthly Sandostatin 11-20-2017  . Microcytic anemia   . RBBB (right bundle branch block with left anterior fascicular block)   . Urinary retention   . Venous reflux     SOCIAL HX:  Social History   Tobacco Use  . Smoking status: Never Smoker  . Smokeless tobacco: Never Used  Substance Use Topics  . Alcohol use: Not Currently    Alcohol/week: 1.0 standard drink    Types: 1 Standard drinks or equivalent per week    Comment: week   FAMILY HX:  Family History  Problem Relation Age of Onset  . Hypertension Mother   . Stroke Father   .  Prostate cancer Father        'slow growing'  . Kidney Stones Sister   . Cancer Son 77       neuroendocrine tumors of small intestine    ALLERGIES: No Known Allergies   PERTINENT MEDICATIONS:  Outpatient Encounter Medications as of 07/20/2020  Medication Sig  . acetaminophen (TYLENOL) 325 MG tablet Take 2 tablets (650 mg total) by mouth every 6 (six) hours as needed for moderate pain, fever or headache.  Marland Kitchen alum & mag hydroxide-simeth (MAALOX/MYLANTA) 200-200-20 MG/5ML suspension Take 30 mLs by mouth every 6 (six) hours as needed for indigestion or heartburn.  Marland Kitchen amLODipine (NORVASC) 5 MG tablet Take 1 tablet (5 mg total) by mouth at bedtime.  . diphenoxylate-atropine (LOMOTIL) 2.5-0.025 MG tablet Take 1 tablet by mouth 4 (four) times daily.  . dorzolamide-timolol (COSOPT) 22.3-6.8 MG/ML ophthalmic solution Place 1 drop into the left eye 2 (two) times daily.   . finasteride (PROSCAR) 5 MG tablet Take 5 mg by mouth at bedtime.   Marland Kitchen loperamide (IMODIUM) 2 MG capsule Take 2-4 mg by mouth 4 (four) times daily as needed for diarrhea or loose stools.   . Multiple Vitamin (MULTIVITAMIN WITH MINERALS) TABS tablet Take 1 tablet by mouth at bedtime. Centrum Silver  . Nystatin (GERHARDT'S BUTT CREAM) CREA Apply 1 application topically 2 (two) times daily. Apply to sacrum and scrotum  . ondansetron (  ZOFRAN) 4 MG tablet Take 1 tablet (4 mg total) by mouth every 6 (six) hours as needed for nausea.  . Saw Palmetto, Serenoa repens, (SAW PALMETTO PO) Take 2 tablets by mouth at bedtime.   . sodium bicarbonate 650 MG tablet Take 2 tablets (1,300 mg total) by mouth 3 (three) times daily.  . tamsulosin (FLOMAX) 0.4 MG CAPS capsule Take 1 capsule (0.4 mg total) by mouth daily after supper.  . Travoprost, BAK Free, (TRAVATAN) 0.004 % SOLN ophthalmic solution Place 1 drop into the left eye at bedtime.    No facility-administered encounter medications on file as of 07/20/2020.    PHYSICAL EXAM / ROS: Height:   74  inches   weight: 218 pounds General: In no acute distress, cooperative Cardiovascular: regular rate and rhythm, no chest pain reported Pulmonary: no cough, no shortness of breath,  normal respiratory effort Abdomen: Reports no abdominal pain, no diarrhea, no nausea or vomiting GU: Foley cath in place, light yellow urine in Foley bag Extremities: no edema Skin: no rashes to exposed skin Neurological: Weakness but otherwise non focal  Teodoro Spray, NP

## 2020-07-23 DIAGNOSIS — R5381 Other malaise: Secondary | ICD-10-CM | POA: Diagnosis not present

## 2020-07-23 DIAGNOSIS — R197 Diarrhea, unspecified: Secondary | ICD-10-CM | POA: Diagnosis not present

## 2020-07-24 ENCOUNTER — Encounter: Payer: Self-pay | Admitting: *Deleted

## 2020-07-24 NOTE — Progress Notes (Signed)
Message from pharmacist, Lenna Sciara that his 07/30/20 injection of somatuline is 1 week too early. Last dose was 07/09/20. High priority scheduling message sent to make change and call SNF

## 2020-07-25 ENCOUNTER — Telehealth: Payer: Self-pay | Admitting: Nurse Practitioner

## 2020-07-25 NOTE — Telephone Encounter (Signed)
Rescheduled appt per 11/2 sch msg - pt daughter is aware of change.

## 2020-07-30 ENCOUNTER — Inpatient Hospital Stay: Payer: No Typology Code available for payment source

## 2020-07-30 ENCOUNTER — Inpatient Hospital Stay: Payer: No Typology Code available for payment source | Admitting: Nurse Practitioner

## 2020-08-03 DIAGNOSIS — J9811 Atelectasis: Secondary | ICD-10-CM | POA: Diagnosis not present

## 2020-08-06 ENCOUNTER — Encounter: Payer: Self-pay | Admitting: Nurse Practitioner

## 2020-08-06 ENCOUNTER — Inpatient Hospital Stay (HOSPITAL_BASED_OUTPATIENT_CLINIC_OR_DEPARTMENT_OTHER): Payer: No Typology Code available for payment source | Admitting: Nurse Practitioner

## 2020-08-06 ENCOUNTER — Other Ambulatory Visit: Payer: Self-pay

## 2020-08-06 ENCOUNTER — Other Ambulatory Visit: Payer: Self-pay | Admitting: *Deleted

## 2020-08-06 ENCOUNTER — Inpatient Hospital Stay: Payer: No Typology Code available for payment source

## 2020-08-06 ENCOUNTER — Inpatient Hospital Stay: Payer: No Typology Code available for payment source | Attending: Oncology

## 2020-08-06 VITALS — BP 142/94 | HR 85 | Temp 97.8°F | Resp 20 | Ht 76.0 in | Wt 202.3 lb

## 2020-08-06 DIAGNOSIS — Z79899 Other long term (current) drug therapy: Secondary | ICD-10-CM | POA: Insufficient documentation

## 2020-08-06 DIAGNOSIS — B9620 Unspecified Escherichia coli [E. coli] as the cause of diseases classified elsewhere: Secondary | ICD-10-CM | POA: Insufficient documentation

## 2020-08-06 DIAGNOSIS — Z87440 Personal history of urinary (tract) infections: Secondary | ICD-10-CM | POA: Diagnosis not present

## 2020-08-06 DIAGNOSIS — R7881 Bacteremia: Secondary | ICD-10-CM | POA: Insufficient documentation

## 2020-08-06 DIAGNOSIS — C7A019 Malignant carcinoid tumor of the small intestine, unspecified portion: Secondary | ICD-10-CM | POA: Insufficient documentation

## 2020-08-06 DIAGNOSIS — K7460 Unspecified cirrhosis of liver: Secondary | ICD-10-CM | POA: Diagnosis not present

## 2020-08-06 DIAGNOSIS — C7B8 Other secondary neuroendocrine tumors: Secondary | ICD-10-CM

## 2020-08-06 DIAGNOSIS — R627 Adult failure to thrive: Secondary | ICD-10-CM | POA: Diagnosis not present

## 2020-08-06 DIAGNOSIS — D61818 Other pancytopenia: Secondary | ICD-10-CM | POA: Insufficient documentation

## 2020-08-06 DIAGNOSIS — N179 Acute kidney failure, unspecified: Secondary | ICD-10-CM | POA: Insufficient documentation

## 2020-08-06 DIAGNOSIS — C7B09 Secondary carcinoid tumors of other sites: Secondary | ICD-10-CM | POA: Insufficient documentation

## 2020-08-06 DIAGNOSIS — N400 Enlarged prostate without lower urinary tract symptoms: Secondary | ICD-10-CM | POA: Diagnosis not present

## 2020-08-06 DIAGNOSIS — C7B02 Secondary carcinoid tumors of liver: Secondary | ICD-10-CM | POA: Insufficient documentation

## 2020-08-06 DIAGNOSIS — R110 Nausea: Secondary | ICD-10-CM | POA: Diagnosis not present

## 2020-08-06 DIAGNOSIS — R197 Diarrhea, unspecified: Secondary | ICD-10-CM | POA: Diagnosis not present

## 2020-08-06 DIAGNOSIS — E340 Carcinoid syndrome: Secondary | ICD-10-CM | POA: Diagnosis not present

## 2020-08-06 LAB — CMP (CANCER CENTER ONLY)
ALT: 14 U/L (ref 0–44)
AST: 29 U/L (ref 15–41)
Albumin: 2.3 g/dL — ABNORMAL LOW (ref 3.5–5.0)
Alkaline Phosphatase: 239 U/L — ABNORMAL HIGH (ref 38–126)
Anion gap: 8 (ref 5–15)
BUN: 16 mg/dL (ref 8–23)
CO2: 25 mmol/L (ref 22–32)
Calcium: 8 mg/dL — ABNORMAL LOW (ref 8.9–10.3)
Chloride: 106 mmol/L (ref 98–111)
Creatinine: 1.23 mg/dL (ref 0.61–1.24)
GFR, Estimated: 60 mL/min — ABNORMAL LOW (ref >60)
Glucose, Bld: 113 mg/dL — ABNORMAL HIGH (ref 70–99)
Potassium: 3.3 mmol/L — ABNORMAL LOW (ref 3.5–5.1)
Sodium: 139 mmol/L (ref 135–145)
Total Bilirubin: 0.5 mg/dL (ref 0.3–1.2)
Total Protein: 6.5 g/dL (ref 6.5–8.1)

## 2020-08-06 LAB — CBC WITH DIFFERENTIAL (CANCER CENTER ONLY)
Abs Immature Granulocytes: 0.01 10*3/uL (ref 0.00–0.07)
Basophils Absolute: 0 10*3/uL (ref 0.0–0.1)
Basophils Relative: 0 %
Eosinophils Absolute: 0.1 10*3/uL (ref 0.0–0.5)
Eosinophils Relative: 2 %
HCT: 27.6 % — ABNORMAL LOW (ref 39.0–52.0)
Hemoglobin: 9.4 g/dL — ABNORMAL LOW (ref 13.0–17.0)
Immature Granulocytes: 0 %
Lymphocytes Relative: 34 %
Lymphs Abs: 1.7 10*3/uL (ref 0.7–4.0)
MCH: 25.6 pg — ABNORMAL LOW (ref 26.0–34.0)
MCHC: 34.1 g/dL (ref 30.0–36.0)
MCV: 75.2 fL — ABNORMAL LOW (ref 80.0–100.0)
Monocytes Absolute: 0.4 10*3/uL (ref 0.1–1.0)
Monocytes Relative: 7 %
Neutro Abs: 2.8 10*3/uL (ref 1.7–7.7)
Neutrophils Relative %: 57 %
Platelet Count: 172 10*3/uL (ref 150–400)
RBC: 3.67 MIL/uL — ABNORMAL LOW (ref 4.22–5.81)
RDW: 18.5 % — ABNORMAL HIGH (ref 11.5–15.5)
WBC Count: 5 10*3/uL (ref 4.0–10.5)
nRBC: 0 % (ref 0.0–0.2)

## 2020-08-06 MED ORDER — LANREOTIDE ACETATE 120 MG/0.5ML ~~LOC~~ SOLN
120.0000 mg | Freq: Once | SUBCUTANEOUS | Status: AC
  Administered 2020-08-06: 120 mg via SUBCUTANEOUS
  Filled 2020-08-06: qty 120

## 2020-08-06 NOTE — Progress Notes (Addendum)
[facility] OFFICE PROGRESS NOTE   Diagnosis: Metastatic carcinoid  INTERVAL HISTORY:   Charles Schmidt returns for follow-up.  He received lanreotide on July 09, 2020.  He reports poorly controlled diarrhea estimating 3-5 watery bowel movements a day.  He denies bleeding.  No black stools.  On his medication list from the nursing facility he is taking Questran, Imodium, Lomotil.  Objective:  Vital signs in last 24 hours:  Blood pressure (!) 142/94, pulse 85, temperature 97.8 F (36.6 C), temperature source Tympanic, resp. rate 20, height 6' 4" (1.93 m), weight 202 lb 4.8 oz (91.8 kg), SpO2 100 %.    Resp: Lungs clear bilaterally. Cardio: Regular rate and rhythm. GI: Abdomen is distended.  Liver palpable. Vascular: Trace edema at the lower legs bilaterally.   Lab Results:  Lab Results  Component Value Date   WBC 5.0 08/06/2020   HGB 9.4 (L) 08/06/2020   HCT 27.6 (L) 08/06/2020   MCV 75.2 (L) 08/06/2020   PLT 172 08/06/2020   NEUTROABS 2.8 08/06/2020    Imaging:  No results found.  Medications: I have reviewed the patient's current medications.  Assessment/Plan: 1.Metastatic carcinoid tumor, biopsy of a liver lesion 10/23/2017 consistent with a well-differentiated neuroendocrine neoplasm, WHO grade 2,ki-6710%  CT abdomen/pelvis 10/13/2017-extensive liver metastases, cirrhosis, soft tissue mass in the small bowel mesentery versus a primary small bowel tumor, tiny lucent lesions in the pelvic bones  Elevated chromogranin A and 24-hour urine 5-HIAA  CT chest 11/24/2017-subpleural nodule in the left lower lobe with associated radiotracer activity on comparison DOTATATEPET scan. No additional evidence of thoracic metastasis.  DOTATATE PET scan3/01/2018-intense radiotracer accumulation with innumerable confluent hepatic metastasis; intense radiotracer activity within central mesenteric mass; 2 foci of uptake associated with the small bowel; intense  radiotracer activity associated periaortic and paraspinal lymph nodes; more distant solitary small metastasis within the pleural space left lower lobe.  Monthly Sandostatin initiated 11/26/2017, dose increased to 30 mg 01/21/2018  Cycle 1 Lutathera 03/03/2018, monthly Sandostatin continued  Cycle 2 Lutathera 04/27/2018, monthly Sandostatin continued  Cycle 3 Lutathera 06/23/2018  Cycle 4 Lutathera 08/18/2018  Restaging dotatate PET scan 09/29/2018-multiple lesions again demonstrated within the liver which accumulate the radiotracer. The number of lesions which accumulate the tracer has decreased visually compared to the prior exam. Some lesions are no longer evident along the right hepatic margin. Remaining measurable lesions have radiotracer activity similar to prior. No new lesions present. Central mesenteric mass unchanged and remains avid for radiotracer. Adjacent lesion within the small bowel SUV max equal 10.5 compared to SUV max equal 15.8. Smaller lesion previously identified in the upper pelvis small bowel appears decreased in activity. No new metastatic lesions. Lesion position between the right psoas muscle and spine SUV max equal to 21.8 compared with SUV max equal 26.2.  Dotatate PET 10/19/2019-no evidence of disease progression, stable multifocal hepatic metastases, unchanged from post therapy scan 03/30/2019 and improved from pretherapy scan5/01/2018. Stable mesenteric mass, improved retroperitoneal lymph node adjacent to the celiac trunk, solitary small bowel lesion unchanged  Monthly Sandostatin continued  01/26/2020 chromogranin A level stable elevation  CT abdomen/pelvis 06/05/2020-slight enlargement of central mesenteric mass, extensive hepatic metastases-unchanged, bowel wall thickening in central small bowel loops and right colon  Lanreotide 07/09/2020  2.Diarrhea-likely secondary to carcinoid syndrome, improved with Sandostatin 3.History of anorexia/weight  loss-improved 4.Prostatic hypertrophy;status post laser vaporization 01/05/2018 5. Mild pancytopenia following Lutathera treatment 6.Thoracic dermatomal zoster rash 04/02/2019 treated with Valtrex 7.  Admission 06/05/2020 after a fall with  failure to thrive, increased diarrhea, nausea, and abdominal pain 8.  Markedly elevated AST and CPK on admission -likely rhabdomyolysis 9.  Acute renal failure-improved 10.  Staph epidermidis bacteremia and E. coli UTI 06/22/2020 -completed course of IV antibiotics   Disposition: Charles Schmidt reports continued diarrhea despite lanreotide and several antidiarrheals.  He will receive lanreotide today.  We adjusted the dose of Lomotil to 1 tablet 4 times a day and Imodium 1 tablet 3 times a day before meals.  He will continue cholestyramine.  Review of labs from today shows hypokalemia likely related to diarrhea.  He will begin K-Dur 20 milliequivalents daily.  He will return for lab and follow-up in 3 weeks, possible lanreotide.  Patient seen with Dr. Benay Spice.    Izsak Meir ANP/GNP-BC   08/06/2020  3:13 PM This was a shared visit with Ned Card.  Mr. Kowalewski was interviewed and examined.  He continues to have significant diarrhea despite lanreotide and the current medical regimen.  The plan is to continue lanreotide.  We adjusted the Imodium and Lomotil doses today.  He will begin a potassium supplement.  Mr. Wenzler would like to consider stopping sodium bicarbonate.  His renal function has improved.  We  recommend this be decided by the skilled nursing facility MD and nephrology.   Julieanne Manson, MD

## 2020-08-06 NOTE — Patient Outreach (Signed)
[facility] Coordinator follow up. Member screened for potential [facility] Care Management needs as a benefit of [place] [employer].  Update received from [employer] indicating member is progressing with therapy. Anticipated transition date not known at this time.   Will continue to follow for transition plans and potential [facility] Care Management needs.   Marthenia Rolling, MSN, RN,BSN [place] Acute Care Coordinator [phone] [facility]) [phone]  (Toll free office)

## 2020-08-06 NOTE — Patient Instructions (Signed)
Lanreotide injection What is this medicine? LANREOTIDE (lan REE oh tide) is used to reduce blood levels of growth hormone in patients with a condition called acromegaly. It also works to slow or stop tumor growth in patients with neuroendocrine tumors and treat carcinoid syndrome. This medicine may be used for other purposes; ask your health care provider or pharmacist if you have questions. COMMON BRAND NAME(S): Somatuline [place] What should I tell my health care provider before I take this medicine? They need to know if you have any of these conditions:  diabetes  gallbladder disease  heart disease  kidney disease  liver disease  thyroid disease  an unusual or allergic reaction to lanreotide, other medicines, foods, dyes, or preservatives  pregnant or trying to get pregnant  breast-feeding How should I use this medicine? This medicine is for injection under the skin. It is given by a health care professional in a hospital or clinic setting. Contact your pediatrician or health care professional regarding the use of this medicine in children. Special care may be needed. Overdosage: If you think you have taken too much of this medicine contact a poison control center or emergency room at once. NOTE: This medicine is only for you. Do not share this medicine with others. What if I miss a dose? It is important not to miss your dose. Call your doctor or health care professional if you are unable to keep an appointment. What may interact with this medicine? This medicine may interact with the following medications:  bromocriptine  cyclosporine  certain medicines for blood pressure, heart disease, irregular heart beat  certain medicines for diabetes  quinidine  terfenadine This list may not describe all possible interactions. Give your health care provider a list of all the medicines, herbs, non-prescription drugs, or dietary supplements you use. Also tell them if you smoke,  drink alcohol, or use illegal drugs. Some items may interact with your medicine. What should I watch for while using this medicine? Tell your doctor or healthcare professional if your symptoms do not start to get better or if they get worse. Visit your doctor or health care professional for regular checks on your progress. Your condition will be monitored carefully while you are receiving this medicine. This medicine may increase blood sugar. Ask your healthcare provider if changes in diet or medicines are needed if you have diabetes. You may need blood work done while you are taking this medicine. Women should inform their doctor if they wish to become pregnant or think they might be pregnant. There is a potential for serious side effects to an unborn child. Talk to your health care professional or pharmacist for more information. Do not breast-feed an infant while taking this medicine or for 6 months after stopping it. This medicine has caused ovarian failure in some women. This medicine may interfere with the ability to have a child. Talk with your doctor or health care professional if you are concerned about your fertility. What side effects may I notice from receiving this medicine? Side effects that you should report to your doctor or health care professional as soon as possible:  allergic reactions like skin rash, itching or hives, swelling of the face, lips, or tongue  increased blood pressure  severe stomach pain  signs and symptoms of hgh blood sugar such as being more thirsty or hungry or having to urinate more than normal. You may also feel very tired or have blurry vision.  signs and symptoms of low blood   sugar such as feeling anxious; confusion; dizziness; increased hunger; unusually weak or tired; sweating; shakiness; cold; irritable; headache; blurred vision; fast heartbeat; loss of consciousness  unusually slow heartbeat Side effects that usually do not require medical  attention (report to your doctor or health care professional if they continue or are bothersome):  constipation  diarrhea  dizziness  headache  muscle pain  muscle spasms  nausea  pain, redness, or irritation at site where injected This list may not describe all possible side effects. Call your doctor for medical advice about side effects. You may report side effects to FDA at 1-800-FDA-1088. Where should I keep my medicine? This drug is given in a hospital or clinic and will not be stored at home. NOTE: This sheet is a summary. It may not cover all possible information. If you have questions about this medicine, talk to your doctor, pharmacist, or health care provider.  2020 Elsevier/Gold Standard (2018-06-17 09:13:08)  

## 2020-08-07 ENCOUNTER — Telehealth: Payer: Self-pay | Admitting: Nurse Practitioner

## 2020-08-07 NOTE — Telephone Encounter (Signed)
Scheduled appointment per 11/15 los. Called patient, no answer. Voicemail was full, was unable to leave a msg. Mailed updated calendar to patient with appointment date and time.

## 2020-08-08 LAB — CHROMOGRANIN A: Chromogranin A (ng/mL): 11060 ng/mL — ABNORMAL HIGH (ref 0.0–101.8)

## 2020-08-14 ENCOUNTER — Emergency Department (HOSPITAL_COMMUNITY): Payer: Medicare Other

## 2020-08-14 ENCOUNTER — Inpatient Hospital Stay (HOSPITAL_COMMUNITY)
Admission: EM | Admit: 2020-08-14 | Discharge: 2020-08-22 | DRG: 435 | Disposition: A | Discharge status: Skilled Nursing Facility | Payer: Medicare Other | Source: Skilled Nursing Facility | Attending: Family Medicine | Admitting: Family Medicine

## 2020-08-14 ENCOUNTER — Other Ambulatory Visit: Payer: Self-pay

## 2020-08-14 ENCOUNTER — Encounter (HOSPITAL_COMMUNITY): Payer: Self-pay | Admitting: *Deleted

## 2020-08-14 DIAGNOSIS — E872 Acidosis, unspecified: Secondary | ICD-10-CM

## 2020-08-14 DIAGNOSIS — I10 Essential (primary) hypertension: Secondary | ICD-10-CM | POA: Diagnosis not present

## 2020-08-14 DIAGNOSIS — N179 Acute kidney failure, unspecified: Secondary | ICD-10-CM | POA: Diagnosis present

## 2020-08-14 DIAGNOSIS — E3400 Carcinoid syndrome, unspecified: Secondary | ICD-10-CM

## 2020-08-14 DIAGNOSIS — R630 Anorexia: Secondary | ICD-10-CM | POA: Diagnosis not present

## 2020-08-14 DIAGNOSIS — R109 Unspecified abdominal pain: Secondary | ICD-10-CM | POA: Diagnosis not present

## 2020-08-14 DIAGNOSIS — R066 Hiccough: Secondary | ICD-10-CM | POA: Diagnosis not present

## 2020-08-14 DIAGNOSIS — R55 Syncope and collapse: Secondary | ICD-10-CM | POA: Diagnosis present

## 2020-08-14 DIAGNOSIS — D490 Neoplasm of unspecified behavior of digestive system: Secondary | ICD-10-CM | POA: Diagnosis not present

## 2020-08-14 DIAGNOSIS — R4182 Altered mental status, unspecified: Secondary | ICD-10-CM | POA: Diagnosis not present

## 2020-08-14 DIAGNOSIS — Z7189 Other specified counseling: Secondary | ICD-10-CM

## 2020-08-14 DIAGNOSIS — T68XXXA Hypothermia, initial encounter: Secondary | ICD-10-CM

## 2020-08-14 DIAGNOSIS — Z515 Encounter for palliative care: Secondary | ICD-10-CM

## 2020-08-14 DIAGNOSIS — R197 Diarrhea, unspecified: Secondary | ICD-10-CM | POA: Diagnosis not present

## 2020-08-14 DIAGNOSIS — I129 Hypertensive chronic kidney disease with stage 1 through stage 4 chronic kidney disease, or unspecified chronic kidney disease: Secondary | ICD-10-CM | POA: Diagnosis present

## 2020-08-14 DIAGNOSIS — R5381 Other malaise: Secondary | ICD-10-CM | POA: Diagnosis not present

## 2020-08-14 DIAGNOSIS — E876 Hypokalemia: Secondary | ICD-10-CM | POA: Diagnosis present

## 2020-08-14 DIAGNOSIS — R338 Other retention of urine: Secondary | ICD-10-CM | POA: Diagnosis present

## 2020-08-14 DIAGNOSIS — N401 Enlarged prostate with lower urinary tract symptoms: Secondary | ICD-10-CM | POA: Diagnosis present

## 2020-08-14 DIAGNOSIS — R7989 Other specified abnormal findings of blood chemistry: Secondary | ICD-10-CM

## 2020-08-14 DIAGNOSIS — J9811 Atelectasis: Secondary | ICD-10-CM | POA: Diagnosis not present

## 2020-08-14 DIAGNOSIS — B965 Pseudomonas (aeruginosa) (mallei) (pseudomallei) as the cause of diseases classified elsewhere: Secondary | ICD-10-CM | POA: Diagnosis present

## 2020-08-14 DIAGNOSIS — I4510 Unspecified right bundle-branch block: Secondary | ICD-10-CM | POA: Diagnosis not present

## 2020-08-14 DIAGNOSIS — N1831 Chronic kidney disease, stage 3a: Secondary | ICD-10-CM | POA: Diagnosis present

## 2020-08-14 DIAGNOSIS — B029 Zoster without complications: Secondary | ICD-10-CM | POA: Diagnosis not present

## 2020-08-14 DIAGNOSIS — Z20822 Contact with and (suspected) exposure to covid-19: Secondary | ICD-10-CM | POA: Diagnosis present

## 2020-08-14 DIAGNOSIS — Z79899 Other long term (current) drug therapy: Secondary | ICD-10-CM

## 2020-08-14 DIAGNOSIS — R64 Cachexia: Secondary | ICD-10-CM | POA: Diagnosis present

## 2020-08-14 DIAGNOSIS — E43 Unspecified severe protein-calorie malnutrition: Secondary | ICD-10-CM | POA: Insufficient documentation

## 2020-08-14 DIAGNOSIS — D61818 Other pancytopenia: Secondary | ICD-10-CM | POA: Diagnosis present

## 2020-08-14 DIAGNOSIS — R180 Malignant ascites: Secondary | ICD-10-CM | POA: Diagnosis present

## 2020-08-14 DIAGNOSIS — C7B8 Other secondary neuroendocrine tumors: Secondary | ICD-10-CM | POA: Diagnosis present

## 2020-08-14 DIAGNOSIS — Z823 Family history of stroke: Secondary | ICD-10-CM

## 2020-08-14 DIAGNOSIS — Z7401 Bed confinement status: Secondary | ICD-10-CM | POA: Diagnosis not present

## 2020-08-14 DIAGNOSIS — R627 Adult failure to thrive: Secondary | ICD-10-CM | POA: Diagnosis present

## 2020-08-14 DIAGNOSIS — R112 Nausea with vomiting, unspecified: Secondary | ICD-10-CM | POA: Diagnosis present

## 2020-08-14 DIAGNOSIS — E861 Hypovolemia: Secondary | ICD-10-CM | POA: Diagnosis present

## 2020-08-14 DIAGNOSIS — D569 Thalassemia, unspecified: Secondary | ICD-10-CM | POA: Diagnosis present

## 2020-08-14 DIAGNOSIS — C7B02 Secondary carcinoid tumors of liver: Secondary | ICD-10-CM | POA: Diagnosis present

## 2020-08-14 DIAGNOSIS — A419 Sepsis, unspecified organism: Secondary | ICD-10-CM | POA: Diagnosis not present

## 2020-08-14 DIAGNOSIS — J3489 Other specified disorders of nose and nasal sinuses: Secondary | ICD-10-CM | POA: Diagnosis not present

## 2020-08-14 DIAGNOSIS — G9341 Metabolic encephalopathy: Secondary | ICD-10-CM

## 2020-08-14 DIAGNOSIS — K6389 Other specified diseases of intestine: Secondary | ICD-10-CM | POA: Diagnosis not present

## 2020-08-14 DIAGNOSIS — D3A8 Other benign neuroendocrine tumors: Secondary | ICD-10-CM | POA: Diagnosis not present

## 2020-08-14 DIAGNOSIS — N400 Enlarged prostate without lower urinary tract symptoms: Secondary | ICD-10-CM | POA: Diagnosis present

## 2020-08-14 DIAGNOSIS — I959 Hypotension, unspecified: Secondary | ICD-10-CM | POA: Diagnosis present

## 2020-08-14 DIAGNOSIS — D638 Anemia in other chronic diseases classified elsewhere: Secondary | ICD-10-CM | POA: Diagnosis present

## 2020-08-14 DIAGNOSIS — Z8042 Family history of malignant neoplasm of prostate: Secondary | ICD-10-CM

## 2020-08-14 DIAGNOSIS — M6281 Muscle weakness (generalized): Secondary | ICD-10-CM | POA: Diagnosis not present

## 2020-08-14 DIAGNOSIS — N390 Urinary tract infection, site not specified: Secondary | ICD-10-CM | POA: Diagnosis present

## 2020-08-14 DIAGNOSIS — C7A8 Other malignant neuroendocrine tumors: Secondary | ICD-10-CM | POA: Diagnosis not present

## 2020-08-14 DIAGNOSIS — D3A00 Benign carcinoid tumor of unspecified site: Secondary | ICD-10-CM | POA: Diagnosis present

## 2020-08-14 DIAGNOSIS — R52 Pain, unspecified: Secondary | ICD-10-CM | POA: Diagnosis not present

## 2020-08-14 DIAGNOSIS — M2550 Pain in unspecified joint: Secondary | ICD-10-CM | POA: Diagnosis not present

## 2020-08-14 DIAGNOSIS — H409 Unspecified glaucoma: Secondary | ICD-10-CM | POA: Diagnosis not present

## 2020-08-14 DIAGNOSIS — N320 Bladder-neck obstruction: Secondary | ICD-10-CM | POA: Diagnosis not present

## 2020-08-14 DIAGNOSIS — K639 Disease of intestine, unspecified: Secondary | ICD-10-CM | POA: Diagnosis not present

## 2020-08-14 DIAGNOSIS — N183 Chronic kidney disease, stage 3 unspecified: Secondary | ICD-10-CM

## 2020-08-14 DIAGNOSIS — R410 Disorientation, unspecified: Secondary | ICD-10-CM | POA: Diagnosis not present

## 2020-08-14 DIAGNOSIS — K529 Noninfective gastroenteritis and colitis, unspecified: Secondary | ICD-10-CM | POA: Diagnosis present

## 2020-08-14 DIAGNOSIS — Z789 Other specified health status: Secondary | ICD-10-CM | POA: Diagnosis not present

## 2020-08-14 DIAGNOSIS — Z66 Do not resuscitate: Secondary | ICD-10-CM | POA: Diagnosis present

## 2020-08-14 DIAGNOSIS — R1110 Vomiting, unspecified: Secondary | ICD-10-CM

## 2020-08-14 DIAGNOSIS — H548 Legal blindness, as defined in USA: Secondary | ICD-10-CM | POA: Diagnosis present

## 2020-08-14 DIAGNOSIS — R404 Transient alteration of awareness: Secondary | ICD-10-CM | POA: Diagnosis not present

## 2020-08-14 DIAGNOSIS — Z8249 Family history of ischemic heart disease and other diseases of the circulatory system: Secondary | ICD-10-CM

## 2020-08-14 LAB — CBC WITH DIFFERENTIAL/PLATELET
Abs Immature Granulocytes: 0.02 10*3/uL (ref 0.00–0.07)
Basophils Absolute: 0 10*3/uL (ref 0.0–0.1)
Basophils Relative: 0 %
Eosinophils Absolute: 0 10*3/uL (ref 0.0–0.5)
Eosinophils Relative: 1 %
HCT: 30.1 % — ABNORMAL LOW (ref 39.0–52.0)
Hemoglobin: 9.6 g/dL — ABNORMAL LOW (ref 13.0–17.0)
Immature Granulocytes: 0 %
Lymphocytes Relative: 15 %
Lymphs Abs: 1 10*3/uL (ref 0.7–4.0)
MCH: 25.7 pg — ABNORMAL LOW (ref 26.0–34.0)
MCHC: 31.9 g/dL (ref 30.0–36.0)
MCV: 80.7 fL (ref 80.0–100.0)
Monocytes Absolute: 0.3 10*3/uL (ref 0.1–1.0)
Monocytes Relative: 5 %
Neutro Abs: 5.4 10*3/uL (ref 1.7–7.7)
Neutrophils Relative %: 79 %
Platelets: 145 10*3/uL — ABNORMAL LOW (ref 150–400)
RBC: 3.73 MIL/uL — ABNORMAL LOW (ref 4.22–5.81)
RDW: 19.4 % — ABNORMAL HIGH (ref 11.5–15.5)
WBC: 6.8 10*3/uL (ref 4.0–10.5)
nRBC: 0 % (ref 0.0–0.2)

## 2020-08-14 LAB — COMPREHENSIVE METABOLIC PANEL
ALT: 18 U/L (ref 0–44)
AST: 48 U/L — ABNORMAL HIGH (ref 15–41)
Albumin: 2.2 g/dL — ABNORMAL LOW (ref 3.5–5.0)
Alkaline Phosphatase: 185 U/L — ABNORMAL HIGH (ref 38–126)
Anion gap: 13 (ref 5–15)
BUN: 16 mg/dL (ref 8–23)
CO2: 19 mmol/L — ABNORMAL LOW (ref 22–32)
Calcium: 8.1 mg/dL — ABNORMAL LOW (ref 8.9–10.3)
Chloride: 108 mmol/L (ref 98–111)
Creatinine, Ser: 1.33 mg/dL — ABNORMAL HIGH (ref 0.61–1.24)
GFR, Estimated: 54 mL/min — ABNORMAL LOW (ref >60)
Glucose, Bld: 143 mg/dL — ABNORMAL HIGH (ref 70–99)
Potassium: 3.3 mmol/L — ABNORMAL LOW (ref 3.5–5.1)
Sodium: 140 mmol/L (ref 135–145)
Total Bilirubin: 1.4 mg/dL — ABNORMAL HIGH (ref 0.3–1.2)
Total Protein: 6.6 g/dL (ref 6.5–8.1)

## 2020-08-14 LAB — I-STAT VENOUS BLOOD GAS, ED
Acid-base deficit: 3 mmol/L — ABNORMAL HIGH (ref 0.0–2.0)
Bicarbonate: 21 mmol/L (ref 20.0–28.0)
Calcium, Ion: 1.15 mmol/L (ref 1.15–1.40)
HCT: 32 % — ABNORMAL LOW (ref 39.0–52.0)
Hemoglobin: 10.9 g/dL — ABNORMAL LOW (ref 13.0–17.0)
O2 Saturation: 97 %
Potassium: 3.2 mmol/L — ABNORMAL LOW (ref 3.5–5.1)
Sodium: 143 mmol/L (ref 135–145)
TCO2: 22 mmol/L (ref 22–32)
pCO2, Ven: 34.1 mmHg — ABNORMAL LOW (ref 44.0–60.0)
pH, Ven: 7.397 (ref 7.250–7.430)
pO2, Ven: 93 mmHg — ABNORMAL HIGH (ref 32.0–45.0)

## 2020-08-14 LAB — RESPIRATORY PANEL BY RT PCR (FLU A&B, COVID)
Influenza A by PCR: NEGATIVE
Influenza B by PCR: NEGATIVE
SARS Coronavirus 2 by RT PCR: NEGATIVE

## 2020-08-14 LAB — URINALYSIS, ROUTINE W REFLEX MICROSCOPIC
Bacteria, UA: NONE SEEN
Bilirubin Urine: NEGATIVE
Glucose, UA: NEGATIVE mg/dL
Ketones, ur: NEGATIVE mg/dL
Nitrite: NEGATIVE
Protein, ur: NEGATIVE mg/dL
Specific Gravity, Urine: 1.005 (ref 1.005–1.030)
WBC, UA: >50 WBC/hpf — ABNORMAL HIGH (ref 0–5)
pH: 6 (ref 5.0–8.0)

## 2020-08-14 LAB — LIPASE, BLOOD: Lipase: 19 U/L (ref 11–51)

## 2020-08-14 LAB — BRAIN NATRIURETIC PEPTIDE: B Natriuretic Peptide: 82.4 pg/mL (ref 0.0–100.0)

## 2020-08-14 LAB — PROTIME-INR
INR: 1.4 — ABNORMAL HIGH (ref 0.8–1.2)
Prothrombin Time: 16.7 seconds — ABNORMAL HIGH (ref 11.4–15.2)

## 2020-08-14 LAB — TROPONIN I (HIGH SENSITIVITY)
Troponin I (High Sensitivity): 9 ng/L (ref <18)
Troponin I (High Sensitivity): 10 ng/L (ref <18)

## 2020-08-14 LAB — AMMONIA: Ammonia: 52 umol/L — ABNORMAL HIGH (ref 9–35)

## 2020-08-14 LAB — GLUCOSE, CAPILLARY: Glucose-Capillary: 98 mg/dL (ref 70–99)

## 2020-08-14 LAB — LACTIC ACID, PLASMA
Lactic Acid, Venous: 3.5 mmol/L (ref 0.5–1.9)
Lactic Acid, Venous: 2.4 mmol/L (ref 0.5–1.9)

## 2020-08-14 LAB — APTT: aPTT: 25 seconds (ref 24–36)

## 2020-08-14 MED ORDER — POLYETHYLENE GLYCOL 3350 17 G PO PACK: 17.0000 g | PACK | Freq: Every day | ORAL | Status: DC | PRN

## 2020-08-14 MED ORDER — TRAMADOL HCL 50 MG PO TABS: 50.0000 mg | ORAL_TABLET | Freq: Three times a day (TID) | ORAL | Status: DC | PRN

## 2020-08-14 MED ORDER — LATANOPROST 0.005 % OP SOLN
1.0000 [drp] | Freq: Every day | OPHTHALMIC | Status: DC
  Administered 2020-08-15 – 2020-08-21 (×7): 1 [drp] via OPHTHALMIC
  Filled 2020-08-14: qty 2.5

## 2020-08-14 MED ORDER — METRONIDAZOLE IN NACL 5-0.79 MG/ML-% IV SOLN
500.0000 mg | Freq: Once | INTRAVENOUS | Status: AC
  Administered 2020-08-14: 500 mg via INTRAVENOUS
  Filled 2020-08-14: qty 100

## 2020-08-14 MED ORDER — HEPARIN SODIUM (PORCINE) 5000 UNIT/ML IJ SOLN
5000.0000 [IU] | Freq: Three times a day (TID) | INTRAMUSCULAR | Status: DC
  Administered 2020-08-14 – 2020-08-22 (×19): 5000 [IU] via SUBCUTANEOUS
  Filled 2020-08-14 (×21): qty 1

## 2020-08-14 MED ORDER — SODIUM CHLORIDE 0.9 % IV SOLN
2.0000 g | Freq: Once | INTRAVENOUS | Status: AC
  Administered 2020-08-14: 2 g via INTRAVENOUS
  Filled 2020-08-14: qty 2

## 2020-08-14 MED ORDER — IOHEXOL 300 MG/ML  SOLN
100.0000 mL | Freq: Once | INTRAMUSCULAR | Status: AC | PRN
  Administered 2020-08-14: 100 mL via INTRAVENOUS

## 2020-08-14 MED ORDER — VANCOMYCIN HCL 2000 MG/400ML IV SOLN
2000.0000 mg | Freq: Once | INTRAVENOUS | Status: AC
  Administered 2020-08-14: 2000 mg via INTRAVENOUS
  Filled 2020-08-14: qty 400

## 2020-08-14 MED ORDER — VANCOMYCIN HCL 750 MG/150ML IV SOLN
750.0000 mg | Freq: Two times a day (BID) | INTRAVENOUS | Status: DC
  Filled 2020-08-14: qty 150

## 2020-08-14 MED ORDER — SODIUM CHLORIDE 0.9 % IV BOLUS
1000.0000 mL | Freq: Once | INTRAVENOUS | Status: AC
  Administered 2020-08-14: 1000 mL via INTRAVENOUS

## 2020-08-14 MED ORDER — ONDANSETRON HCL 4 MG PO TABS
4.0000 mg | ORAL_TABLET | Freq: Four times a day (QID) | ORAL | Status: DC | PRN
  Administered 2020-08-14: 4 mg via ORAL
  Filled 2020-08-14: qty 1

## 2020-08-14 MED ORDER — AMLODIPINE BESYLATE 5 MG PO TABS
5.0000 mg | ORAL_TABLET | Freq: Every day | ORAL | Status: DC
  Administered 2020-08-14 – 2020-08-15 (×2): 5 mg via ORAL
  Filled 2020-08-14 (×2): qty 1

## 2020-08-14 MED ORDER — SODIUM CHLORIDE 0.9 % IV SOLN: 2.0000 g | Freq: Two times a day (BID) | INTRAVENOUS | Status: DC

## 2020-08-14 MED ORDER — LACTATED RINGERS IV SOLN: INTRAVENOUS | Status: DC

## 2020-08-14 MED ORDER — DORZOLAMIDE HCL-TIMOLOL MAL 2-0.5 % OP SOLN
1.0000 [drp] | Freq: Two times a day (BID) | OPHTHALMIC | Status: DC
  Administered 2020-08-15 – 2020-08-21 (×8): 1 [drp] via OPHTHALMIC
  Filled 2020-08-14: qty 10

## 2020-08-14 MED ORDER — FENTANYL CITRATE (PF) 100 MCG/2ML IJ SOLN: 12.5000 ug | INTRAMUSCULAR | Status: DC | PRN

## 2020-08-14 MED ORDER — ONDANSETRON HCL 4 MG/2ML IJ SOLN
4.0000 mg | Freq: Once | INTRAMUSCULAR | Status: AC
  Administered 2020-08-14: 4 mg via INTRAVENOUS
  Filled 2020-08-14: qty 2

## 2020-08-14 MED ORDER — POTASSIUM CHLORIDE CRYS ER 20 MEQ PO TBCR
40.0000 meq | EXTENDED_RELEASE_TABLET | Freq: Once | ORAL | Status: AC
  Administered 2020-08-14: 40 meq via ORAL
  Filled 2020-08-14: qty 2

## 2020-08-14 MED ORDER — VANCOMYCIN HCL IN DEXTROSE 1-5 GM/200ML-% IV SOLN: 1000.0000 mg | Freq: Once | INTRAVENOUS | Status: DC

## 2020-08-14 MED ORDER — SODIUM CHLORIDE 0.9% FLUSH
3.0000 mL | Freq: Two times a day (BID) | INTRAVENOUS | Status: DC
  Administered 2020-08-15 – 2020-08-16 (×3): 3 mL via INTRAVENOUS
  Administered 2020-08-16: 2.5 mL via INTRAVENOUS
  Administered 2020-08-17 – 2020-08-21 (×9): 3 mL via INTRAVENOUS
  Administered 2020-08-21: 2.5 mL via INTRAVENOUS

## 2020-08-14 MED ORDER — TAMSULOSIN HCL 0.4 MG PO CAPS
0.4000 mg | ORAL_CAPSULE | Freq: Every day | ORAL | Status: DC
  Administered 2020-08-14 – 2020-08-22 (×9): 0.4 mg via ORAL
  Filled 2020-08-14 (×9): qty 1

## 2020-08-14 MED ORDER — OXYCODONE HCL 5 MG PO TABS: 5.0000 mg | ORAL_TABLET | ORAL | Status: DC | PRN

## 2020-08-14 MED ORDER — METOPROLOL TARTRATE 5 MG/5ML IV SOLN: 5.0000 mg | Freq: Four times a day (QID) | INTRAVENOUS | Status: DC | PRN

## 2020-08-14 MED ORDER — POTASSIUM CHLORIDE 10 MEQ/100ML IV SOLN
10.0000 meq | Freq: Once | INTRAVENOUS | Status: AC
  Administered 2020-08-14: 10 meq via INTRAVENOUS
  Filled 2020-08-14: qty 100

## 2020-08-14 MED ORDER — CHLORHEXIDINE GLUCONATE CLOTH 2 % EX PADS
6.0000 | MEDICATED_PAD | Freq: Every day | CUTANEOUS | Status: DC
  Administered 2020-08-15 – 2020-08-21 (×6): 6 via TOPICAL

## 2020-08-14 MED ORDER — FINASTERIDE 5 MG PO TABS
5.0000 mg | ORAL_TABLET | Freq: Every day | ORAL | Status: DC
  Administered 2020-08-14 – 2020-08-21 (×8): 5 mg via ORAL
  Filled 2020-08-14 (×8): qty 1

## 2020-08-14 MED ORDER — ONDANSETRON HCL 4 MG/2ML IJ SOLN
4.0000 mg | Freq: Four times a day (QID) | INTRAMUSCULAR | Status: DC | PRN
  Administered 2020-08-15: 4 mg via INTRAVENOUS
  Filled 2020-08-14: qty 2

## 2020-08-14 MED ORDER — PROMETHAZINE HCL 25 MG/ML IJ SOLN
12.5000 mg | Freq: Once | INTRAMUSCULAR | Status: AC
  Administered 2020-08-14: 12.5 mg via INTRAVENOUS
  Filled 2020-08-14: qty 1

## 2020-08-14 NOTE — ED Notes (Signed)
Son at bedside.

## 2020-08-14 NOTE — ED Provider Notes (Signed)
Patient is a 79 year old male whose care was transferred to me at shift change from [facility].  Her HPI is below:  Charles Schmidt is a 79 y.o. male with past medical history see below who presents for evaluation of syncope and emesis. Per EMS patient went to get in the shower at nursing facility.  Stated he did not feel good had multiple episodes of emesis as well as stool incontinence.  On EMS arrival patient only arousable to painful stimuli.  Had systolic blood pressure in the 50s.  Given 300 cc IV fluid as well as Zofran with persistent nausea and EMS track.  Has had gradual improvement in mentation on arrival to the ED. No seizure like activity per EMS. Apparently lowered to ground by staff. Patient aggitated on exam, confused.  Hypotensive and hypothermic with EMS. Initially hypoxic with EMS however no hypoxia here in ED.  Patient has no complaints.  Does keep repeating "IV morphine" however denies any pain. Active emesis in room, NBNB.Seems confused.  Recently dc 6 weeks ago for AMS, bacteremia, UTI,  Fall, ARF, colitis  [name] in room. States patient is confused not "acting like himself."  Physical Exam  BP (!) 143/92   Pulse 85   Temp (!) 96.4 F (35.8 C) (Rectal)   Resp 13   SpO2 98%   Physical Exam Vitals and nursing note reviewed.  Constitutional:      Appearance: He is well-developed. He is ill-appearing (Chronically ill appearing) and toxic-appearing. He is not diaphoretic.     Comments: Confused, agitated  Thin, elderly male  HENT:     Head: Normocephalic and atraumatic.  Eyes:     Pupils: Pupils are equal, round, and reactive to light.  Cardiovascular:     Rate and Rhythm: Normal rate and regular rhythm.     Pulses: Normal pulses.     Heart sounds: Normal heart sounds.  Pulmonary:     Effort: Pulmonary effort is normal. No respiratory distress.     Breath sounds: Normal breath sounds.  Abdominal:     General: Bowel sounds are normal. There is  distension.     Palpations: Abdomen is soft. There is no mass.     Tenderness: There is no abdominal tenderness. There is no right CVA tenderness, left CVA tenderness, guarding or rebound.     Hernia: No hernia is present.  Genitourinary:    Comments: Foley cath in place with cloudy dark urine Musculoskeletal:        General: No swelling, tenderness, deformity or signs of injury. Normal range of motion.     Cervical back: Normal range of motion and neck supple.     Right lower leg: Edema present.     Left lower leg: Edema present.     Comments: Is all 4 extremities spontaneously.  Bilateral lower extremity 1-2+ pitting edema to knees.  Skin:    General: Skin is cool and dry.     Capillary Refill: Capillary refill takes more than 3 seconds.     Comments: Cold skin throughout  Neurological:     General: No focal deficit present.     Comments: Cn 2-12 grossly intact Alert to name, DOB however not location, year. Does not recall event earlier today.  ED Course/Procedures     Procedures  MDM  Patient is a 79 year old male who presents to the emergency department due to a syncopal episode that occurred earlier today.  He then began experiencing intractable nausea and vomiting as  well as stool incontinence.  EMS was contacted and he was only arousable to painful stimuli and was found to be hypotensive as well as hypothermic.  No visible seizure activity.  Was initially hypoxic with EMS but this has resolved.  Patient was recently admitted and discharged about 6 weeks ago for AMS, bacteremia, UTI, fall, ARF, colitis.  He is currently in [facility].  Previous provider called code sepsis on this patient.  Started on IV fluids.  Flagyl, cefepime, vancomycin for broad-spectrum coverage.  Potassium for hypokalemia.  Multiple rounds of antiemetics.  Ammonia elevated at 52.  Elevated PT/INR.  Lactic acid at 3.5.  UA shows large hemoglobin, large leukocytes, greater than 50 white blood cells,  white blood cell clumps, mucus.  No bacteria.  Patient has a chronic indwelling Foley catheter.  Urine culture sent.  CT abd/pelvis with findings noted below:  IMPRESSION:  1. 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 Correlation with lactate may be helpful to exclude the  possibility of worsening and or developing ischemia of bowel given  vascular encasement.  2. Diffuse infiltration of the liver, some of these areas actually  appears smaller than on previous imaging or more confluent.  Significance uncertain given the diffuse nature of disease seen on  the current exam.  3. Small RIGHT-sided pleural effusion is increased in size slightly  since the prior study and is associated with RIGHT basilar airspace  disease.  4. Stable LEFT pleural based nodule.  5. Aortic atherosclerosis.     Respiratory panel has been obtained and is negative.  Will admit patient to the hospitalist service for further work-up.  Discussed CODE STATUS with patient's son who is at bedside.  He is a full code.     Rayna Sexton, PA-C 08/14/20 1629    Veryl Speak, MD 08/15/20 1536

## 2020-08-14 NOTE — Plan of Care (Signed)

## 2020-08-14 NOTE — H&P (Signed)
History and Physical    Charles Schmidt VOH:[id number] DOB: 14-Aug-1941 DOA: 08/14/2020  PCP: Nolene Ebbs, MD  Patient coming from: SNF   Chief Complaint: Syncope and emesis   HPI: Charles Schmidt is a 79 y.o. male with medical history significant of metastatic carcinoid tumor, BPH, hypertension who presents from a nursing facility secondary to syncopal episode and emesis.  Per EMS, patient went into get a shower at his nursing facility, stated that he did not feel well, had multiple episodes of emesis as well as stool incontinence.  Upon EMS arrival, his systolic blood pressure was in the 50s, he was given IV fluids and Zofran.  No seizure-like activity noted per EMS.  Per son, patient is confused and not acting like himself.  On my exam, patient states "give me morphine, let me die." He denies any pain, however.  When I asked patient's son regarding their previous palliative care and hospice conversations, son states that patient was initially considering hospice care but then had clinical improvement and was discharged to rehab facility.  Most recently in rehab, he was able to ambulate with assistance, eating well.  Son is surprised that patient is asking for morphine given that he is not complaining of any pain.  He is also surprised that patient is stating that he wants to die when he had been improving so well after his discharge from recent hospitalization.  ED Course: Vital signs stable, labs reveal hypokalemia, creatinine 1.33, lactic acid 3.5 trended down to 2.4.  Blood cultures obtained.  Chest x-ray showed bibasilar atelectasis, CT head without acute abnormality.  CT abdomen pelvis feels increased ascites, small bowel thickening and perienteric stranding seen on the previous study in the setting of central mesenteric mass that encases mesenteric vasculature.  Patient was started on broad-spectrum antibiotics including vancomycin, cefepime, Flagyl.  He was also given IV fluid.  Review of  Systems: As per HPI. Otherwise, all other review of systems reviewed and are negative.   Past Medical History:  Diagnosis Date  . Bladder diverticulum 06/06/2020  . BPH with obstruction/lower urinary tract symptoms   . Cataract, mature    12-28-2017  per pt right eye, legally blind  . Diarrhea concurrent with and due to carcinoid syndrome ([place])    12-28-2017 improvement since started Sandostatin monthly  . ED (erectile dysfunction)   . Elevated PSA   . Family history of cancer in son   . Family history of prostate cancer   . Foley catheter in place   . Glaucoma, left eye   . History of colon polyps   . History of urinary retention    2015; 2016; 01/ 2019  . Hypertension   . Metastatic malignant neuroendocrine tumor to liver [facility]) on[facility]oigst-  dr Benay Spice   dx 10-23-2017 by liver bx-- WHO grade 2; small bowel mesenteric mass, cirrhosis, mets left lower lobe solitary nodule ,  periaortic and paraspinal lymph nodes METS--  started monthly Sandostatin 11-20-2017  . Microcytic anemia   . RBBB (right bundle branch block with left anterior fascicular block)   . Urinary retention   . Venous reflux     Past Surgical History:  Procedure Laterality Date  . INGUINAL HERNIA REPAIR  1990s   unsure side  . IR US GUIDE BX ASP/DRAIN  10/23/2017  . QUADRICEPS TENDON REPAIR Left 01-08-2011   dr Veverly Fells  . THULIUM LASER TURP (TRANSURETHRAL RESECTION OF PROSTATE) N/A 01/05/2018   Procedure: Marcelino Duster LASER OF THE PROSTATE;  Surgeon: Festus Aloe, MD;  Location: [facility];  Service: Urology;  Laterality: N/A;  . TONSILLECTOMY  child     reports that he has never smoked. He has never used smokeless tobacco. He reports previous alcohol use of about 1.0 standard drink of alcohol per week. He reports that he does not use drugs.  No Known Allergies  Family History  Problem Relation Age of Onset  . Hypertension Mother   . Stroke Father   . Prostate cancer Father        'slow  growing'  . Kidney Stones Sister   . Cancer Son 24       neuroendocrine tumors of small intestine     Prior to Admission medications   Medication Sig Start Date End Date Taking? Authorizing Provider  acetaminophen (TYLENOL) 325 MG tablet Take 2 tablets (650 mg total) by mouth every 6 (six) hours as needed for moderate pain, fever or headache. 07/03/20  Yes Martyn Malay, MD  alum & mag hydroxide-simeth (MAALOX/MYLANTA) 200-200-20 MG/5ML suspension Take 30 mLs by mouth every 6 (six) hours as needed for indigestion or heartburn. 07/03/20  Yes Martyn Malay, MD  amLODipine (NORVASC) 5 MG tablet Take 1 tablet (5 mg total) by mouth at bedtime. 05/01/18  Yes Jacqualine Mau, NP  cholestyramine light (PREVALITE) 4 g packet Take 4 g by mouth 2 (two) times daily.   Yes [provider]  diphenoxylate-atropine (LOMOTIL) 2.5-0.025 MG tablet Take 1 tablet by mouth 4 (four) times daily. 07/03/20  Yes Martyn Malay, MD  dorzolamide-timolol (COSOPT) 22.3-6.8 MG/ML ophthalmic solution Place 1 drop into the left eye 2 (two) times daily.    Yes [provider]  finasteride (PROSCAR) 5 MG tablet Take 5 mg by mouth at bedtime.    Yes [provider]  liver oil-zinc oxide (BOUDREAUXS BUTT PASTE) 40 % ointment Apply 1 application topically 2 (two) times daily at 8 am and 10 pm.   Yes [provider]  loperamide (IMODIUM A-D) 2 MG tablet Take 2 mg by mouth every 6 (six) hours as needed for diarrhea or loose stools.   Yes [provider]  loperamide (IMODIUM) 2 MG capsule Take 2-4 mg by mouth 3 (three) times daily.    Yes [provider]  Multiple Vitamins-Minerals (CENTRUM SILVER) tablet Take 1 tablet by mouth at bedtime.   Yes [provider]  ondansetron (ZOFRAN) 4 MG tablet Take 1 tablet (4 mg total) by mouth every 6 (six) hours as needed for nausea. 07/03/20  Yes Martyn Malay, MD  potassium chloride (MICRO-K) 10 MEQ CR capsule Take 10 mEq  by mouth 2 (two) times daily.   Yes [provider]  Saw Palmetto, Serenoa repens, (SAW PALMETTO PO) Take 160 mg by mouth at bedtime.    Yes [provider]  sodium bicarbonate 650 MG tablet Take 2 tablets (1,300 mg total) by mouth 3 (three) times daily. 07/03/20  Yes Martyn Malay, MD  tamsulosin (FLOMAX) 0.4 MG CAPS capsule Take 1 capsule (0.4 mg total) by mouth daily after supper. 07/03/20  Yes Martyn Malay, MD  Travoprost, BAK Free, (TRAVATAN) 0.004 % SOLN ophthalmic solution Place 1 drop into the left eye at bedtime.    Yes [provider]  Nystatin (GERHARDT'S BUTT CREAM) CREA Apply 1 application topically 2 (two) times daily. Apply to sacrum and scrotum Patient not taking: Reported on 08/14/2020 07/03/20   Martyn Malay, MD    Physical Exam: Vitals:  08/14/20 1345 08/14/20 1434 08/14/20 1445 08/14/20 1600  BP: 131/88  (!) 143/92 132/90  Pulse: 81  85 86  Resp: 18  13 18   Temp:  (!) 96.4 F (35.8 C)    TempSrc:  Rectal    SpO2: 99%  98% 99%     Constitutional: NAD, calm, comfortable Eyes: PERRL, lids and conjunctivae normal ENMT: Mucous membranes are moist. Normal dentition.  Respiratory: Clear to auscultation bilaterally, no wheezing, no crackles. Normal respiratory effort. No accessory muscle use. No conversational dyspnea  Cardiovascular: Regular rate and rhythm, no murmurs. No extremity edema.  Abdomen: Soft, mildly distended with firmness in the epigastrium, nontender to palpation.  Musculoskeletal: No joint deformity upper and lower extremities. No contractures. Normal muscle tone.  Skin: no rashes, lesions, ulcers on exposed skin  Neurologic: Alert and oriented to person, place  Labs on Admission: I have personally reviewed following labs and imaging studies  CBC: Recent Labs  Lab 08/14/20 1020 08/14/20 1149  WBC 6.8  --   NEUTROABS 5.4  --   HGB 9.6* 10.9*  HCT 30.1* 32.0*  MCV 80.7  --   PLT 145*  --    Basic Metabolic  Panel: Recent Labs  Lab 08/14/20 1020 08/14/20 1149  NA 140 143  K 3.3* 3.2*  CL 108  --   CO2 19*  --   GLUCOSE 143*  --   BUN 16  --   CREATININE 1.33*  --   CALCIUM 8.1*  --    GFR: Estimated Creatinine Clearance: 55.3 mL/min (A) (by C-G formula based on SCr of 1.33 mg/dL (H)). Liver Function Tests: Recent Labs  Lab 08/14/20 1020  AST 48*  ALT 18  ALKPHOS 185*  BILITOT 1.4*  PROT 6.6  ALBUMIN 2.2*   Recent Labs  Lab 08/14/20 1020  LIPASE 19   Recent Labs  Lab 08/14/20 1228  AMMONIA 52*   Coagulation Profile: Recent Labs  Lab 08/14/20 1020  INR 1.4*   Cardiac Enzymes: No results for input(s): CKTOTAL, CKMB, CKMBINDEX, TROPONINI in the last 168 hours. BNP (last 3 results) No results for input(s): PROBNP in the last 8760 hours. HbA1C: No results for input(s): HGBA1C in the last 72 hours. CBG: No results for input(s): GLUCAP in the last 168 hours. Lipid Profile: No results for input(s): CHOL, HDL, LDLCALC, TRIG, CHOLHDL, LDLDIRECT in the last 72 hours. Thyroid Function Tests: No results for input(s): TSH, T4TOTAL, FREET4, T3FREE, THYROIDAB in the last 72 hours. Anemia Panel: No results for input(s): VITAMINB12, FOLATE, FERRITIN, TIBC, IRON, RETICCTPCT in the last 72 hours. Urine analysis:    Component Value Date/Time   COLORURINE YELLOW 08/14/2020 1135   APPEARANCEUR CLOUDY (A) 08/14/2020 1135   LABSPEC 1.005 08/14/2020 1135   PHURINE 6.0 08/14/2020 1135   GLUCOSEU NEGATIVE 08/14/2020 1135   HGBUR LARGE (A) 08/14/2020 1135   BILIRUBINUR NEGATIVE 08/14/2020 1135   KETONESUR NEGATIVE 08/14/2020 1135   PROTEINUR NEGATIVE 08/14/2020 1135   UROBILINOGEN 0.2 10/22/2014 2058   NITRITE NEGATIVE 08/14/2020 1135   LEUKOCYTESUR LARGE (A) 08/14/2020 1135   Sepsis Labs: !!!!!!!!!!!!!!!!!!!!!!!!!!!!!!!!!!!!!!!!!!!! @LABRCNTIP (procalcitonin:4,lacticidven:4) ) Recent Results (from the past 240 hour(s))  Respiratory Panel by RT PCR (Flu A&B, Covid) -  Nasopharyngeal Swab     Status: None   Collection Time: 08/14/20 11:58 AM   Specimen: Nasopharyngeal Swab; Nasopharyngeal(NP) swabs in vial transport medium  Result Value Ref Range Status   SARS Coronavirus 2 by RT PCR NEGATIVE NEGATIVE Final    Comment: (NOTE) SARS-CoV-2  target nucleic acids are NOT DETECTED.  The SARS-CoV-2 RNA is generally detectable in upper respiratoy specimens during the acute phase of infection. The lowest concentration of SARS-CoV-2 viral copies this assay can detect is 131 copies/mL. A negative result does not preclude SARS-Cov-2 infection and should not be used as the sole basis for treatment or other patient management decisions. A negative result may occur with  improper specimen collection/handling, submission of specimen other than nasopharyngeal swab, presence of viral mutation(s) within the areas targeted by this assay, and inadequate number of viral copies (<131 copies/mL). A negative result must be combined with clinical observations, patient history, and epidemiological information. The expected result is Negative.  Fact Sheet for Patients:  PinkCheek.be  Fact Sheet for Healthcare Providers:  GravelBags.it  This test is no t yet approved or cleared by the [place] FDA and  has been authorized for detection and/or diagnosis of SARS-CoV-2 by FDA under an Emergency Use Authorization (EUA). This EUA will remain  in effect (meaning this test can be used) for the duration of the COVID-19 declaration under Section 564(b)(1) of the Act, 21 U.S.C. section 360bbb-3(b)(1), unless the authorization is terminated or revoked sooner.     Influenza A by PCR NEGATIVE NEGATIVE Final   Influenza B by PCR NEGATIVE NEGATIVE Final    Comment: (NOTE) The Xpert Xpress SARS-CoV-2/FLU/RSV assay is intended as an aid in  the diagnosis of influenza from Nasopharyngeal swab specimens and  should not be used  as a sole basis for treatment. Nasal washings and  aspirates are unacceptable for Xpert Xpress SARS-CoV-2/FLU/RSV  testing.  Fact Sheet for Patients: PinkCheek.be  Fact Sheet for Healthcare Providers: GravelBags.it  This test is not yet approved or cleared by the [place] FDA and  has been authorized for detection and/or diagnosis of SARS-CoV-2 by  FDA under an Emergency Use Authorization (EUA). This EUA will remain  in effect (meaning this test can be used) for the duration of the  Covid-19 declaration under Section 564(b)(1) of the Act, 21  U.S.C. section 360bbb-3(b)(1), unless the authorization is  terminated or revoked. Performed at [facility], [place] [address] [place],  [address]      Radiological Exams on Admission: CT Head Wo Contrast  Result Date: 08/14/2020 CLINICAL DATA:  Delirium. EXAM: CT HEAD WITHOUT CONTRAST TECHNIQUE: Contiguous axial images were obtained from the base of the skull through the vertex without intravenous contrast. COMPARISON:  06/05/2020. FINDINGS: Brain: No evidence of acute large vascular territory infarction, hemorrhage, hydrocephalus, extra-axial collection or mass lesion/mass effect. Patchy white matter hypodensities, likely related to chronic microvascular ischemic disease. Age congruent cerebral volume loss. Vascular: Calcific atherosclerosis. Skull: No acute fracture. Sinuses/Orbits: Mild ethmoid air cell mucosal thickening. No air-fluid levels. Unremarkable orbits. Other: No mastoid effusions. IMPRESSION: 1. No evidence of acute intracranial abnormality. 2. Chronic microvascular ischemic disease. Electronically Signed   By: Margaretha Sheffield MD   On: 08/14/2020 14:49   CT Abdomen Pelvis W Contrast  Result Date: 08/14/2020 CLINICAL DATA:  Nausea vomiting, diarrhea and syncopal episode. History of neuroendocrine tumor. EXAM: CT ABDOMEN AND PELVIS WITH CONTRAST TECHNIQUE:  Multidetector CT imaging of the abdomen and pelvis was performed using the standard protocol following bolus administration of intravenous contrast. CONTRAST:  15mL OMNIPAQUE IOHEXOL 300 MG/ML  SOLN COMPARISON:  Multiple prior studies including a CT from September 142021 and June 06, 2020 FINDINGS: Lower chest: Small RIGHT-sided pleural effusion. This is increased in size slightly since the prior study and  is associated with RIGHT basilar airspace disease. Basilar atelectasis in the LEFT chest. No pericardial effusion. Distal esophageal thickening similar to prior studies. Hepatobiliary: Diffuse infiltration of the liver with tumor, areas of calcification have developed over time, in the relatively short interval since June 06, 2020 there are innumerable areas of calcification and decreased size of discrete masslike areas in the liver since the study of June 05, 2020, for instance on image 34 of series 10 a lesion with peripheral calcification measuring approximately 16 mm previously 24 mm. There is still however extensive disease throughout the liver on the current exam. The portal vein is patent into the liver. Hepatic veins are patent. Pancreas: Pancreatic atrophy. No pancreatic ductal dilation or inflammation. Spleen: Spleen normal in size. Adrenals/Urinary Tract: Adrenal glands are normal. Kidneys without hydronephrosis. Stable LEFT renal cyst. Urinary bladder decompressed secondary to Foley catheter. Stomach/Bowel: Patulous distal esophagus. No small bowel dilation in the proximal small bowel, beginning in the mid small bowel in areas subtended by the mesentery involved by the central mesenteric mass there is mural stratification, and perienteric stranding that is similar to the study of June 06, 2020. No sign of pneumatosis. Mesenteric stranding peripheral to the mass in the central mesentery is similar. RIGHT colonic thickening is similar as well. Central mesenteric mass measuring  approximately 3.5 x 2.4 cm previously as large as 3.8 cm but changes in mesenteric position could alter measurements in the axial plane, measurements in the coronal plane in the range of 5 cm are similar to the prior study. Vascular/Lymphatic: Calcified atheromatous plaque scattered about the abdominal aorta. There is no gastrohepatic or hepatoduodenal ligament lymphadenopathy. No retroperitoneal or mesenteric lymphadenopathy. In case mint of mesenteric vasculature with similar appearance. Venous collateral pathways in the central abdomen also similar. Reproductive: Heterogeneous prostate, nonspecific finding on CT. No pelvic sidewall lymphadenopathy. Other: Moderate volume ascites is slightly increased from the prior exam. Ascites was present but is increased in volume particularly in the pelvis. Musculoskeletal: No acute bone finding. No destructive bone process. IMPRESSION: 1. Increasing ascites with similar appearance of small bowel thickening and perienteric stranding seen on the previous study in the setting of central mesenteric mass that encases mesenteric vasculature. Correlation with lactate may be helpful to exclude the possibility of worsening and or developing ischemia of bowel given vascular encasement. 2. Diffuse infiltration of the liver, some of these areas actually appears smaller than on previous imaging or more confluent. Significance uncertain given the diffuse nature of disease seen on the current exam. 3. Small RIGHT-sided pleural effusion is increased in size slightly since the prior study and is associated with RIGHT basilar airspace disease. 4. Stable LEFT pleural based nodule. 5. Aortic atherosclerosis. Aortic Atherosclerosis (ICD10-I70.0). Electronically Signed   By: Zetta Bills M.D.   On: 08/14/2020 15:37   DG Chest Port 1 View  Result Date: 08/14/2020 CLINICAL DATA:  Sepsis EXAM: PORTABLE CHEST 1 VIEW COMPARISON:  06/21/2020 FINDINGS: Stable cardiomediastinal contours. Low lung  volumes with persistent elevation of the right hemidiaphragm. Mild streaky bibasilar opacities. No appreciable pleural fluid collection. No pneumothorax. Degenerative changes of the shoulders. IMPRESSION: Low lung volumes with streaky bibasilar opacities, likely atelectasis. Electronically Signed   By: Davina Poke D.O.   On: 08/14/2020 11:07    EKG: Independently reviewed.  Normal sinus rhythm with RBBB  Assessment/Plan Principal Problem:   Acute metabolic encephalopathy Active Problems:   Essential hypertension (primary)   Hypokalemia   Benign prostatic hyperplasia   Metastatic malignant neuroendocrine tumor  to liver (HCC)   Bowel wall thickening   Nausea and vomiting in adult   Goals of care, counseling/discussion   DNR (do not resuscitate)   Syncope   CKD (chronic kidney disease) stage 3, GFR 30-59 ml/min (HCC)   Acute metabolic encephalopathy -Noted by his son that patient does not seem to be acting like himself.  Patient is oriented to self and place at the time of admission  N/V, question ischemic bowel -Sepsis ruled out, did not have any signs of tachycardia, tachypnea.  WBC was normal on admission -Previous admission June 06, 2020 revealed similar CT abdomen findings.  At that time general surgery was consulted, had no surgical intervention offers, recommended palliative care  -Patient without acute abdomen on examination, he has no tenderness to palpation.  Lactic acid has improved.  Continue to monitor -IVF   Metastatic carcinoid tumor, central mesenteric mass with extensive liver mets -Follows with Dr. Benay Spice  Hypokalemia -Replace, trend  CKD stage IIIa -Baseline creatinine 1.2 -Stable  Essential hypertension -Continue Norvasc  BPH -Continue Proscar, Flomax  Goals of care -On examination, patient stated to me multiple times that he is ready to die.  Son is very surprised by this as patient had been improving in rehab most recently.  Son is not  sure if this is truly patient's wishes or not.  We discussed supportive care overnight, reevaluating patient's status in the morning and having another discussion with palliative care team.  We discussed that if patient improves enough to be discharged back to SNF, he could follow with palliative care as an outpatient.  At that time, patient could decide that he would not want to be rehospitalized in case of deterioration as an outpatient.  Alternatively, if he decompensates during this hospitalization, he could transition to full comfort care and symptom management only. -Palliative care consulted  DVT prophylaxis: Subcutaneous heparin   Code Status: DNR, confirmed with patient as well as his son at the time of admission Family Communication: Son at bedside Disposition Plan: Pending clinical improvement, suspect he will return back to his SNF setting Consults called: Palliative care medicine   Status is: Inpatient  Remains inpatient appropriate because:Altered mental status and Ongoing diagnostic testing needed not appropriate for outpatient work up   Dispo: The patient is from: SNF              Anticipated d/c is to: SNF              Anticipated d/c date is: > 3 days              Patient currently is not medically stable to d/c.   * I certify that at the point of admission it is my clinical judgment that the patient will require inpatient hospital care spanning beyond 2 midnights from the point of admission due to high intensity of service, high risk for further deterioration and high frequency of surveillance required.Dessa Phi, DO [facility] 08/14/2020, 4:29 PM   Available via Epic secure chat 7am-7pm After these hours, please refer to coverage provider listed on [email or web address]

## 2020-08-14 NOTE — ED Notes (Signed)
attempted to give report, this rn told to call back.

## 2020-08-14 NOTE — ED Provider Notes (Signed)
[facility] [facility] Provider Note   CSN: [id number] Arrival date & time: 08/14/20  1007    History Chief Complaint  Patient presents with  . Near Syncope  . Emesis  . Diarrhea    Fatih Schmidt is a 79 y.o. male with past medical history see below who presents for evaluation of syncope and emesis. Per EMS patient went to get in the shower at nursing facility.  Stated he did not feel good had multiple episodes of emesis as well as stool incontinence.  On EMS arrival patient only arousable to painful stimuli.  Had systolic blood pressure in the 50s.  Given 300 cc IV fluid as well as Zofran with persistent nausea and EMS track.  Has had gradual improvement in mentation on arrival to the ED. No seizure like activity per EMS. Apparently lowered to ground by staff. Patient aggitated on exam, confused.  Hypotensive and hypothermic with EMS. Initially hypoxic with EMS however no hypoxia here in ED.   Patient has no complaints.  Does keep repeating "IV morphine" however denies any pain. Active emesis in room, NBNB.Seems confused.  Recently dc 6 weeks ago for AMS, bacteremia, UTI,  Fall, ARF, colitis   [name] in room. States patient is confused not "acting like himself."   Level 5 Caveat- AMS  PCP- Nolene Ebbs   HPI     Past Medical History:  Diagnosis Date  . Bladder diverticulum 06/06/2020  . BPH with obstruction/lower urinary tract symptoms   . Cataract, mature    12-28-2017  per pt right eye, legally blind  . Diarrhea concurrent with and due to carcinoid syndrome ([place])    12-28-2017 improvement since started Sandostatin monthly  . ED (erectile dysfunction)   . Elevated PSA   . Family history of cancer in son   . Family history of prostate cancer   . Foley catheter in place   . Glaucoma, left eye   . History of colon polyps   . History of urinary retention    2015; 2016; 01/ 2019  . Hypertension   . Metastatic malignant  neuroendocrine tumor to liver [facility]) oncoloigst-  dr Benay Spice   dx 10-23-2017 by liver bx-- WHO grade 2; small bowel mesenteric mass, cirrhosis, mets left lower lobe solitary nodule ,  periaortic and paraspinal lymph nodes METS--  started monthly Sandostatin 11-20-2017  . Microcytic anemia   . RBBB (right bundle branch block with left anterior fascicular block)   . Urinary retention   . Venous reflux     Patient Active Problem List   Diagnosis Date Noted  . Staphylococcus epidermidis bacteremia   . Pressure injury of skin 06/12/2020  . Oliguria   . Palliative care by specialist   . Goals of care, counseling/discussion   . DNR (do not resuscitate)   . Acute renal failure ([place])   . Abdominal pain   . Elevated troponin   . Lives alone 06/06/2020  . Nonspecific elevation of levels of transaminase or lactic acid dehydrogenase (LDH) 06/06/2020  . Increased ammonia level 06/06/2020  . Direct hyperbilirubinemia 06/06/2020  . Bowel wall thickening 06/06/2020  . Vomiting, persistent, in adult 06/06/2020  . Anorexia 06/06/2020  . Colitis   . Elevated liver enzymes   . Nausea and vomiting in adult   . Dehydration   . Physical debility 06/05/2020  . Genetic testing 12/18/2017  . Metastatic malignant neuroendocrine tumor to liver ([place]) 10/28/2017  . Essential hypertension (primary) 05/04/2017  . Hypokalemia  05/04/2017  . Benign prostatic hyperplasia 05/04/2017  . Glaucoma 05/04/2017  . Benign hypertensive heart disease without heart failure 05/04/2017  . Microcytic anemia 05/04/2017  . Prediabetes 05/04/2017  . Hypocalcemia 05/04/2017  . Psoriasis 05/04/2017    Past Surgical History:  Procedure Laterality Date  . INGUINAL HERNIA REPAIR  1990s   unsure side  . IR US GUIDE BX ASP/DRAIN  10/23/2017  . QUADRICEPS TENDON REPAIR Left 01-08-2011   dr Veverly Fells  . THULIUM LASER TURP (TRANSURETHRAL RESECTION OF PROSTATE) N/A 01/05/2018   Procedure: Marcelino Duster LASER OF THE PROSTATE;  Surgeon:  Festus Aloe, MD;  Location: [facility];  Service: Urology;  Laterality: N/A;  . TONSILLECTOMY  child       Family History  Problem Relation Age of Onset  . Hypertension Mother   . Stroke Father   . Prostate cancer Father        'slow growing'  . Kidney Stones Sister   . Cancer Son 12       neuroendocrine tumors of small intestine    Social History   Tobacco Use  . Smoking status: Never Smoker  . Smokeless tobacco: Never Used  Vaping Use  . Vaping Use: Never used  Substance Use Topics  . Alcohol use: Not Currently    Alcohol/week: 1.0 standard drink    Types: 1 Standard drinks or equivalent per week    Comment: week  . Drug use: No    Home Medications Prior to Admission medications   Medication Sig Start Date End Date Taking? Authorizing Provider  acetaminophen (TYLENOL) 325 MG tablet Take 2 tablets (650 mg total) by mouth every 6 (six) hours as needed for moderate pain, fever or headache. 07/03/20  Yes Martyn Malay, MD  alum & mag hydroxide-simeth (MAALOX/MYLANTA) 200-200-20 MG/5ML suspension Take 30 mLs by mouth every 6 (six) hours as needed for indigestion or heartburn. 07/03/20  Yes Martyn Malay, MD  amLODipine (NORVASC) 5 MG tablet Take 1 tablet (5 mg total) by mouth at bedtime. 05/01/18  Yes Jacqualine Mau, NP  cholestyramine light (PREVALITE) 4 g packet Take 4 g by mouth 2 (two) times daily.   Yes [provider]  diphenoxylate-atropine (LOMOTIL) 2.5-0.025 MG tablet Take 1 tablet by mouth 4 (four) times daily. 07/03/20  Yes Martyn Malay, MD  dorzolamide-timolol (COSOPT) 22.3-6.8 MG/ML ophthalmic solution Place 1 drop into the left eye 2 (two) times daily.    Yes [provider]  finasteride (PROSCAR) 5 MG tablet Take 5 mg by mouth at bedtime.    Yes [provider]  liver oil-zinc oxide (BOUDREAUXS BUTT PASTE) 40 % ointment Apply 1 application topically 2 (two) times daily at 8 am and 10 pm.   Yes  [provider]  loperamide (IMODIUM A-D) 2 MG tablet Take 2 mg by mouth every 6 (six) hours as needed for diarrhea or loose stools.   Yes [provider]  loperamide (IMODIUM) 2 MG capsule Take 2-4 mg by mouth 3 (three) times daily.    Yes [provider]  Multiple Vitamins-Minerals (CENTRUM SILVER) tablet Take 1 tablet by mouth at bedtime.   Yes [provider]  ondansetron (ZOFRAN) 4 MG tablet Take 1 tablet (4 mg total) by mouth every 6 (six) hours as needed for nausea. 07/03/20  Yes Martyn Malay, MD  potassium chloride (MICRO-K) 10 MEQ CR capsule Take 10 mEq by mouth 2 (two) times daily.   Yes [provider]  Clarnce Flock  Palmetto, Serenoa repens, (SAW PALMETTO PO) Take 160 mg by mouth at bedtime.    Yes [provider]  sodium bicarbonate 650 MG tablet Take 2 tablets (1,300 mg total) by mouth 3 (three) times daily. 07/03/20  Yes Martyn Malay, MD  tamsulosin (FLOMAX) 0.4 MG CAPS capsule Take 1 capsule (0.4 mg total) by mouth daily after supper. 07/03/20  Yes Martyn Malay, MD  Travoprost, BAK Free, (TRAVATAN) 0.004 % SOLN ophthalmic solution Place 1 drop into the left eye at bedtime.    Yes [provider]  Nystatin (GERHARDT'S BUTT CREAM) CREA Apply 1 application topically 2 (two) times daily. Apply to sacrum and scrotum Patient not taking: Reported on 08/14/2020 07/03/20   Martyn Malay, MD    Allergies    Patient has no known allergies.  Review of Systems   Review of Systems  Unable to perform ROS: Mental status change  Respiratory: Negative.   Cardiovascular: Negative.   Gastrointestinal: Positive for diarrhea, nausea and vomiting. Negative for abdominal distention, abdominal pain, anal bleeding, blood in stool, constipation and rectal pain.  All other systems reviewed and are negative.   Physical Exam Updated Vital Signs BP (!) 143/92   Pulse 85   Temp (!) 96.4 F (35.8 C) (Rectal)   Resp 13   SpO2 98%    Physical Exam Vitals and nursing note reviewed.  Constitutional:      Appearance: He is well-developed. He is ill-appearing (Chronically ill appearing) and toxic-appearing. He is not diaphoretic.     Comments: Confused, agitated  Thin, elderly male  HENT:     Head: Normocephalic and atraumatic.  Eyes:     Pupils: Pupils are equal, round, and reactive to light.  Cardiovascular:     Rate and Rhythm: Normal rate and regular rhythm.     Pulses: Normal pulses.     Heart sounds: Normal heart sounds.  Pulmonary:     Effort: Pulmonary effort is normal. No respiratory distress.     Breath sounds: Normal breath sounds.  Abdominal:     General: Bowel sounds are normal. There is distension.     Palpations: Abdomen is soft. There is no mass.     Tenderness: There is no abdominal tenderness. There is no right CVA tenderness, left CVA tenderness, guarding or rebound.     Hernia: No hernia is present.  Genitourinary:    Comments: Foley cath in place with cloudy dark urine Musculoskeletal:        General: No swelling, tenderness, deformity or signs of injury. Normal range of motion.     Cervical back: Normal range of motion and neck supple.     Right lower leg: Edema present.     Left lower leg: Edema present.     Comments: Is all 4 extremities spontaneously.  Bilateral lower extremity 1-2+ pitting edema to knees.  Skin:    General: Skin is cool and dry.     Capillary Refill: Capillary refill takes more than 3 seconds.     Comments: Cold skin throughout  Neurological:     General: No focal deficit present.     Comments: Cn 2-12 grossly intact Alert to name, DOB however not location, year. Does not recall event earlier today.    ED Results / Procedures / Treatments   Labs (all labs ordered are listed, but only abnormal results are displayed) Labs Reviewed  LACTIC ACID, PLASMA - Abnormal; Notable for the following components:      Result Value  Lactic Acid, Venous 3.5 (*)    All  other components within normal limits  COMPREHENSIVE METABOLIC PANEL - Abnormal; Notable for the following components:   Potassium 3.3 (*)    CO2 19 (*)    Glucose, Bld 143 (*)    Creatinine, Ser 1.33 (*)    Calcium 8.1 (*)    Albumin 2.2 (*)    AST 48 (*)    Alkaline Phosphatase 185 (*)    Total Bilirubin 1.4 (*)    GFR, Estimated 54 (*)    All other components within normal limits  CBC WITH DIFFERENTIAL/PLATELET - Abnormal; Notable for the following components:   RBC 3.73 (*)    Hemoglobin 9.6 (*)    HCT 30.1 (*)    MCH 25.7 (*)    RDW 19.4 (*)    Platelets 145 (*)    All other components within normal limits  PROTIME-INR - Abnormal; Notable for the following components:   Prothrombin Time 16.7 (*)    INR 1.4 (*)    All other components within normal limits  URINALYSIS, ROUTINE W REFLEX MICROSCOPIC - Abnormal; Notable for the following components:   APPearance CLOUDY (*)    Hgb urine dipstick LARGE (*)    Leukocytes,Ua LARGE (*)    WBC, UA >50 (*)    All other components within normal limits  AMMONIA - Abnormal; Notable for the following components:   Ammonia 52 (*)    All other components within normal limits  I-STAT VENOUS BLOOD GAS, ED - Abnormal; Notable for the following components:   pCO2, Ven 34.1 (*)    pO2, Ven 93.0 (*)    Acid-base deficit 3.0 (*)    Potassium 3.2 (*)    HCT 32.0 (*)    Hemoglobin 10.9 (*)    All other components within normal limits  RESPIRATORY PANEL BY RT PCR (FLU A&B, COVID)  URINE CULTURE  CULTURE, BLOOD (ROUTINE X 2)  CULTURE, BLOOD (ROUTINE X 2)  APTT  BRAIN NATRIURETIC PEPTIDE  LIPASE, BLOOD  LACTIC ACID, PLASMA  TROPONIN I (HIGH SENSITIVITY)  TROPONIN I (HIGH SENSITIVITY)    EKG None    Radiology CT Head Wo Contrast  Result Date: 08/14/2020 CLINICAL DATA:  Delirium. EXAM: CT HEAD WITHOUT CONTRAST TECHNIQUE: Contiguous axial images were obtained from the base of the skull through the vertex without intravenous  contrast. COMPARISON:  06/05/2020. FINDINGS: Brain: No evidence of acute large vascular territory infarction, hemorrhage, hydrocephalus, extra-axial collection or mass lesion/mass effect. Patchy white matter hypodensities, likely related to chronic microvascular ischemic disease. Age congruent cerebral volume loss. Vascular: Calcific atherosclerosis. Skull: No acute fracture. Sinuses/Orbits: Mild ethmoid air cell mucosal thickening. No air-fluid levels. Unremarkable orbits. Other: No mastoid effusions. IMPRESSION: 1. No evidence of acute intracranial abnormality. 2. Chronic microvascular ischemic disease. Electronically Signed   By: Margaretha Sheffield MD   On: 08/14/2020 14:49   DG Chest Port 1 View  Result Date: 08/14/2020 CLINICAL DATA:  Sepsis EXAM: PORTABLE CHEST 1 VIEW COMPARISON:  06/21/2020 FINDINGS: Stable cardiomediastinal contours. Low lung volumes with persistent elevation of the right hemidiaphragm. Mild streaky bibasilar opacities. No appreciable pleural fluid collection. No pneumothorax. Degenerative changes of the shoulders. IMPRESSION: Low lung volumes with streaky bibasilar opacities, likely atelectasis. Electronically Signed   By: Davina Poke D.O.   On: 08/14/2020 11:07    Procedures .Critical Care Performed by: Nettie Elm, PA-C Authorized by: Nettie Elm, PA-C   Critical care provider statement:    Critical care  time (minutes):  45   Critical care was necessary to treat or prevent imminent or life-threatening deterioration of the following conditions:  Sepsis, CNS failure or compromise and hepatic failure   Critical care was time spent personally by me on the following activities:  Discussions with consultants, evaluation of patient's response to treatment, examination of patient, ordering and performing treatments and interventions, ordering and review of laboratory studies, ordering and review of radiographic studies, pulse oximetry, re-evaluation of patient's  condition, obtaining history from patient or surrogate and review of old charts   (including critical care time)  Medications Ordered in ED Medications  lactated ringers infusion ( Intravenous New Bag/Given 08/14/20 1143)  metroNIDAZOLE (FLAGYL) IVPB 500 mg (500 mg Intravenous New Bag/Given 08/14/20 1441)  ceFEPIme (MAXIPIME) 2 g in sodium chloride 0.9 % 100 mL IVPB (has no administration in time range)  vancomycin (VANCOREADY) IVPB 750 mg/150 mL (has no administration in time range)  potassium chloride 10 mEq in 100 mL IVPB (10 mEq Intravenous New Bag/Given 08/14/20 1452)  promethazine (PHENERGAN) injection 12.5 mg (12.5 mg Intravenous Given 08/14/20 1030)  ceFEPIme (MAXIPIME) 2 g in sodium chloride 0.9 % 100 mL IVPB (0 g Intravenous Stopped 08/14/20 1224)  vancomycin (VANCOREADY) IVPB 2000 mg/400 mL (0 mg Intravenous Stopped 08/14/20 1434)  ondansetron (ZOFRAN) injection 4 mg (4 mg Intravenous Given 08/14/20 1253)  sodium chloride 0.9 % bolus 1,000 mL (1,000 mLs Intravenous New Bag/Given 08/14/20 1314)  iohexol (OMNIPAQUE) 300 MG/ML solution 100 mL (100 mLs Intravenous Contrast Given 08/14/20 1414)    ED Course  I have reviewed the triage vital signs and the nursing notes.  Pertinent labs & imaging results that were available during my care of the patient were reviewed by me and considered in my medical decision making (see chart for details).  79 year old presents from [facility] rehab with known CA discharge 6 weeks ago for bacteremia presents for evaluation of altered mental status, hypotension and syncope.  Patient hypothermic and hypotensive with EMS. Provided IV fluids.  Had improvement in blood pressure.  Patient is still confused.  Facility denies patient hitting head.  He is hypothermic here to 95 rectally, placed on bear hugger.  Given recent bacteremia, hypothermia, hypotension code sepsis called. Started on broad-spectrum antibiotics and some IV fluids.  His heart and lungs are  clear.  He does have some pitting edema to his lower extremities.  His abdomen is soft and mildly distended.  Does not appear to have any focal weakness, facial droop.  We will plan on labs, imaging and reassess.  Labs and imaging personally reviewed and interpreted:  CBC without leukocytosis Hgb 9.6 similar to prior CMP hypokalemia 3.3, glucose 143, creatinine 1.33, AST 48, alk phos 185, T bili 1.4 Lipase 19 Trop 9 EKG without ischemia, RBBB chronic Lactic 3.5 DG chest with atelectasis CT head without acute changes UA with large leuks > 50 WBC, WBC clumps COVID negative Ammonia 52 VBG no acidosis INR 1.4 BC pending   Patient reassessed. Persistent emesis despite Zofran with EMS and Phenergan here in ED. Will give additional antiemetic. Patient to CT scan. Agitation has improved.  Unable to perform CT as patient with continued emesis. Brought back to room. Will get Abd imaging to ensure no obstruction as cause of his emesis.  Elevated ammonia, unable to tolerate PO intake. Will likely need lactulose enema once CT imaging of abd. Emesis improved however still with dry heaving. Recent received dose of antiemetic. Will hold on additional.   Need  admission for syncope work-up, altered mental status, intractable emesis.  Care transferred to [facility], PA-C who follow-up on imaging, remaining labs    MDM Rules/Calculators/A&P                           Final Clinical Impression(s) / ED Diagnoses Final diagnoses:  Altered mental status, unspecified altered mental status type  Hypotension, unspecified hypotension type  Increased ammonia level  Lactic acid increased  Intractable vomiting with nausea, unspecified vomiting type  Syncope, unspecified syncope type  Hypokalemia  Metastatic malignant neuroendocrine tumor to liver ([place])  Hypothermia, initial encounter    Rx / DC Orders ED Discharge Orders    None       Taija Mathias A, PA-C 08/14/20 1516    Davonna Belling, MD 08/14/20 1604

## 2020-08-14 NOTE — ED Notes (Signed)
Pt transport requested

## 2020-08-14 NOTE — ED Triage Notes (Signed)
PT from [facility] . Pt was in shower  With assistance . Pt had diarrhea  And a syncope episode. Fire responded first Pt hypotensive with BP 58/36 .Marland Kitchen Pt arrived to ED via EMS. PT shouting at staff demanding  Morphine , IV's  And Nasal O2.

## 2020-08-14 NOTE — ED Notes (Signed)
Pt a difficult stick . Anti -bx delayed.

## 2020-08-14 NOTE — Progress Notes (Signed)
Patient ID: Charles Schmidt, male   DOB: 08-Jul-1941, 79 y.o.   MRN: [id number]       Thank you for this consult. Chart reviewed.  No family at bedside.  Called [name] in efforts to arrange family meeting, no answer. Will follow up tomorrow.    Elie Confer, NP-C Palliative Medicine   Please call Palliative Medicine team phone with any questions [phone]. For individual providers please see AMION.   No charge

## 2020-08-14 NOTE — Sepsis Progress Note (Signed)
Elink following this Code Sepsis. 

## 2020-08-14 NOTE — ED Notes (Signed)
This rn attempted to give report, secretary reports this rn to get a call back.

## 2020-08-14 NOTE — Progress Notes (Signed)
Pharmacy Antibiotic Note  Charles Schmidt is a 79 y.o. male admitted on 08/14/2020 with syncope and emesis.  Pharmacy has been consulted for vancomycin and cefepime dosing for sepsis - source unknown. Scr 1.33.mate baseline. CrCl 55 ml/min. WBC wnl.   Plan: Vancomycin 2000mg  x1 loading dose Vancomycin 750mg  IV q12h Cefepime 2g x1 followed by 2g IV q12h  Monitor renal function, cultures/sensitivies, and clinical progression     Temp (24hrs), Avg:96.6 F (35.9 C), Min:95.5 F (35.3 C), Max:97.7 F (36.5 C)  Recent Labs  Lab 08/14/20 1020 08/14/20 1228  WBC 6.8  --   CREATININE 1.33*  --   LATICACIDVEN  --  3.5*    Estimated Creatinine Clearance: 55.3 mL/min (A) (by C-G formula based on SCr of 1.33 mg/dL (H)).    No Known Allergies  Antimicrobials this admission: Vancomycin 11/23 >> Cefepime 11/23 >>  Dose adjustments this admission: N/a  Microbiology results: 11/23 bcx:  11/23 ucx:   Thank you for allowing pharmacy to be a part of this patient's care.  Cristela Felt, PharmD Clinical Pharmacist  08/14/2020 1:46 PM

## 2020-08-15 DIAGNOSIS — R1110 Vomiting, unspecified: Secondary | ICD-10-CM

## 2020-08-15 DIAGNOSIS — R197 Diarrhea, unspecified: Secondary | ICD-10-CM

## 2020-08-15 DIAGNOSIS — E3400 Carcinoid syndrome, unspecified: Secondary | ICD-10-CM

## 2020-08-15 DIAGNOSIS — R112 Nausea with vomiting, unspecified: Secondary | ICD-10-CM

## 2020-08-15 LAB — COMPREHENSIVE METABOLIC PANEL
ALT: 30 U/L (ref 0–44)
AST: 616 U/L — ABNORMAL HIGH (ref 15–41)
Albumin: 1.8 g/dL — ABNORMAL LOW (ref 3.5–5.0)
Alkaline Phosphatase: 166 U/L — ABNORMAL HIGH (ref 38–126)
Anion gap: 15 (ref 5–15)
BUN: 18 mg/dL (ref 8–23)
CO2: 17 mmol/L — ABNORMAL LOW (ref 22–32)
Calcium: 8.1 mg/dL — ABNORMAL LOW (ref 8.9–10.3)
Chloride: 110 mmol/L (ref 98–111)
Creatinine, Ser: 1.44 mg/dL — ABNORMAL HIGH (ref 0.61–1.24)
GFR, Estimated: 49 mL/min — ABNORMAL LOW (ref >60)
Glucose, Bld: 98 mg/dL (ref 70–99)
Potassium: 4.4 mmol/L (ref 3.5–5.1)
Sodium: 142 mmol/L (ref 135–145)
Total Bilirubin: 1.3 mg/dL — ABNORMAL HIGH (ref 0.3–1.2)
Total Protein: 6.1 g/dL — ABNORMAL LOW (ref 6.5–8.1)

## 2020-08-15 LAB — CBC
HCT: 30.1 % — ABNORMAL LOW (ref 39.0–52.0)
Hemoglobin: 10.3 g/dL — ABNORMAL LOW (ref 13.0–17.0)
MCH: 25.4 pg — ABNORMAL LOW (ref 26.0–34.0)
MCHC: 34.2 g/dL (ref 30.0–36.0)
MCV: 74.3 fL — ABNORMAL LOW (ref 80.0–100.0)
Platelets: 111 10*3/uL — ABNORMAL LOW (ref 150–400)
RBC: 4.05 MIL/uL — ABNORMAL LOW (ref 4.22–5.81)
RDW: 18.2 % — ABNORMAL HIGH (ref 11.5–15.5)
WBC: 8.8 10*3/uL (ref 4.0–10.5)
nRBC: 0.3 % — ABNORMAL HIGH (ref 0.0–0.2)

## 2020-08-15 MED ORDER — CHOLESTYRAMINE 4 G PO PACK
4.0000 g | PACK | Freq: Two times a day (BID) | ORAL | Status: DC
  Administered 2020-08-15 – 2020-08-22 (×15): 4 g via ORAL
  Filled 2020-08-15 (×16): qty 1

## 2020-08-15 MED ORDER — DIPHENOXYLATE-ATROPINE 2.5-0.025 MG/5ML PO LIQD: 5.0000 mL | Freq: Four times a day (QID) | ORAL | Status: DC

## 2020-08-15 MED ORDER — LOPERAMIDE HCL 2 MG PO CAPS
2.0000 mg | ORAL_CAPSULE | Freq: Two times a day (BID) | ORAL | Status: DC
  Administered 2020-08-15 – 2020-08-18 (×8): 2 mg via ORAL
  Filled 2020-08-15 (×8): qty 1

## 2020-08-15 MED ORDER — BOOST / RESOURCE BREEZE PO LIQD CUSTOM
1.0000 | Freq: Three times a day (TID) | ORAL | Status: DC
  Administered 2020-08-15 – 2020-08-17 (×2): 1 via ORAL

## 2020-08-15 MED ORDER — OCTREOTIDE ACETATE 50 MCG/ML IJ SOLN
150.0000 ug | Freq: Two times a day (BID) | INTRAMUSCULAR | Status: DC
  Administered 2020-08-16 – 2020-08-20 (×10): 150 ug via SUBCUTANEOUS
  Filled 2020-08-15 (×14): qty 3

## 2020-08-15 MED ORDER — DIPHENOXYLATE-ATROPINE 2.5-0.025 MG PO TABS
1.0000 | ORAL_TABLET | Freq: Four times a day (QID) | ORAL | Status: DC
  Administered 2020-08-15 – 2020-08-22 (×26): 1 via ORAL
  Filled 2020-08-15 (×25): qty 1

## 2020-08-15 NOTE — Plan of Care (Signed)
  Problem: Clinical Measurements: Goal: Ability to maintain clinical measurements within normal limits will improve 08/15/2020 0028 by Dedra Skeens, RN Outcome: Progressing 08/14/2020 2117 by Dedra Skeens, RN Outcome: Progressing Goal: Will remain free from infection 08/15/2020 0028 by Dedra Skeens, RN Outcome: Progressing 08/14/2020 2117 by Dedra Skeens, RN Outcome: Progressing Goal: Diagnostic test results will improve 08/15/2020 0028 by Dedra Skeens, RN Outcome: Progressing 08/14/2020 2117 by Dedra Skeens, RN Outcome: Progressing Goal: Respiratory complications will improve 08/15/2020 0028 by Dedra Skeens, RN Outcome: Progressing 08/14/2020 2117 by Dedra Skeens, RN Outcome: Progressing Goal: Cardiovascular complication will be avoided 08/15/2020 0028 by Dedra Skeens, RN Outcome: Progressing 08/14/2020 2117 by Dedra Skeens, RN Outcome: Progressing   Problem: Clinical Measurements: Goal: Diagnostic test results will improve 08/15/2020 0028 by Dedra Skeens, RN Outcome: Progressing 08/14/2020 2117 by Dedra Skeens, RN Outcome: Progressing   Problem: Clinical Measurements: Goal: Respiratory complications will improve 08/15/2020 0028 by Dedra Skeens, RN Outcome: Progressing 08/14/2020 2117 by Dedra Skeens, RN Outcome: Progressing   Problem: Clinical Measurements: Goal: Cardiovascular complication will be avoided 08/15/2020 0028 by Dedra Skeens, RN Outcome: Progressing 08/14/2020 2117 by Dedra Skeens, RN Outcome: Progressing   Problem: Activity: Goal: Risk for activity intolerance will decrease 08/15/2020 0028 by Dedra Skeens, RN Outcome: Progressing 08/14/2020 2117 by Dedra Skeens, RN Outcome: Progressing   Problem: Nutrition: Goal: Adequate nutrition will be maintained 08/15/2020 0028 by Dedra Skeens, RN Outcome:  Progressing 08/14/2020 2117 by Dedra Skeens, RN Outcome: Progressing

## 2020-08-15 NOTE — Progress Notes (Addendum)
Initial Nutrition Assessment  DOCUMENTATION CODES:   Severe malnutrition in context of chronic illness  INTERVENTION:   Boost Breeze po TID, each supplement provides 250 kcal and 9 grams of protein  Encourage po intake   NUTRITION DIAGNOSIS:   Severe Malnutrition related to chronic illness (Cancer) as evidenced by severe muscle depletion, energy intake < or equal to 75% for > or equal to 1 month, edema, moderate fat depletion.   GOAL:   Patient will meet greater than or equal to 90% of their needs   MONITOR:   PO intake, Supplement acceptance, Diet advancement  REASON FOR ASSESSMENT:   Malnutrition Screening Tool    ASSESSMENT:   Pt with PMH of metastatic carcinoid tumor, BPH, HTN. Presents from rehab facility following recent discharge. Admitted for evaluation of syncope, emesis and hypotension.  Spoke with pt at bedside. Pt reports no appetite recently because he cannot hold anything down. Prior to recent episodes of emesis, his appetite was decreased. Pt could not provide a diet history. Per discussion with RN, he had breakfast but later threw it up and declined to eat anything for lunch. RN administered Zofran for nausea and provided pt with Ensure and he was able to consume about half without problem.   Pt appears to have consumed 50-90% of meals during previous admission, however pt currently  has diarrhea and is on 4 different antidiarrheal medications. Pt unable to meet greater than 75% of estimated needs at this time.  Pt reports his UBW is 250 lbs but stated it was over a year ago when he was last at this weight. Stated he has had a steady decline in weight over the past year. Per chart review, pt has experienced a 10-20 lb weight loss within the past 6 months. Noted pt was experiencing severe ascites, which could be masking further weight loss.    Palliative care consulted. Per MD, prognosis is poor so nutrition support is not recommended at this time. Will  continue to support via oral nutrition as needed.    Labs reviewed.   Medications reviewed and include: Imodium, Sandostatin, questran, lomotil.    NUTRITION - FOCUSED PHYSICAL EXAM:    Most Recent Value  Orbital Region No depletion  Upper Arm Region Moderate depletion  Thoracic and Lumbar Region Unable to assess  Buccal Region Mild depletion  Temple Region Mild depletion  Clavicle Bone Region Severe depletion  Clavicle and Acromion Bone Region Severe depletion  Scapular Bone Region Severe depletion  Dorsal Hand Severe depletion  Patellar Region Moderate depletion  Anterior Thigh Region Moderate depletion  Posterior Calf Region Moderate depletion  Edema (RD Assessment) Mild  Hair Unable to assess  Eyes Reviewed  Mouth Reviewed  Skin Reviewed  [dry]  Nails Reviewed       Diet Order:   Diet Order            Diet clear liquid Room service appropriate? Yes; Fluid consistency: Thin  Diet effective now                 EDUCATION NEEDS:   No education needs have been identified at this time  Skin:  Skin Assessment: Reviewed RN Assessment  Last BM:  11/24  Height:   Ht Readings from Last 1 Encounters:  08/06/20 6\' 4"  (1.93 m)    Weight:   Wt Readings from Last 1 Encounters:  08/15/20 91.6 kg    Ideal Body Weight:  92 kg  BMI:  Body mass index is 24.58  kg/m.  Estimated Nutritional Needs:   Kcal:  2400-2600  Protein:  115-130  Fluid:  >2 L    Ronnald Nian, Dietetic Intern Pager: [phone] If unavailable: [phone]

## 2020-08-15 NOTE — Progress Notes (Signed)
PROGRESS NOTE    Charles Schmidt  ZCH:[id number] DOB: 08-Jun-1941 DOA: 08/14/2020 PCP: Nolene Ebbs, MD  Brief Narrative: 79 year old male with widely metastatic carcinoid tumor, BPH, hypertension had a long hospital stay recently notable for bacteremia, diarrhea, failure to thrive, hospice was considered at the time however given clinical improvement he was discharged to rehab. -Sent to the ED from rehab with syncope, vomiting and hypotension.  When EMS arrived systolic blood pressure was in the 50s, he was given IV fluids. -On discussion patient requests hospice and wants [place] to let him pass away. -Vital signs was subsequently stable, labs noted hypokalemia, mild AKI, mildly elevated lactic acidosis, CT abdomen pelvis noted increase ascites, small bowel thickening and perinephric enteric stranding   Assessment & Plan:   Hypotension Hypovolemia -Likely secondary to ongoing GI losses, chronic diarrhea and intermittent vomiting -Hydrated with saline, blood pressure improved, lactic acidosis has improved as well -Briefly given antibiotics in the ED and then discontinued  Nausea/Vomiting/Diarrhea -Sepsis ruled out, without fever or leukocytosis tachypnea or tachycardia -CT abdomen pelvis noted small bowel thickening and could not rule out mesenteric ischemia, this is somewhat unchanged from prior CT back in September, seen by general surgery then as well and recommended palliative care -Supportive care-restart Questran, Lomotil, Imodium and octreotide -overall prognosis is very poor  Widely metastatic carcinoid tumor with central mesenteric mass, extensive liver metastasis, worsening malignant ascites -Will reach out to oncology, his albumin is 1.8 with third spacing -In my opinion his prognosis is very poor -Palliative care following -Restart Lomotil, Imodium, Questran and octreotide  Chronic kidney disease stage IIIa -Baseline creatinine around 1.2, relatively stable will  monitor  Severe protein calorie malnutrition 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  BPH  -continue Proscar and Flomax  Goals of care -Patient is debilitated by advanced metastatic carcinoid tumor with extensive liver mets, worsening malignant ascites, ongoing failure to thrive, persistent diarrhea despite maximal medical therapy -Palliative consulted for goals of care -In my opinion he would be most appropriate for hospice -Requested oncology input as well  DVT prophylaxis: Subcu heparin Code Status: DNR Family Communication: Discussed with patient in detail, no family at bedside, will attempt to contact family later today Disposition Plan:  Status is: Inpatient  Remains inpatient appropriate because:Inpatient level of care appropriate due to severity of illness   Dispo: The patient is from: SNF              Anticipated d/c is to: SNF              Anticipated d/c date is: 3 days              Patient currently is not medically stable to d/c.  Consultants: Palliative medicine, oncology   Procedures:   Antimicrobials:    Subjective: Reports feeling weak, vomiting overnight, Talking about hospice  Objective: Vitals:   08/14/20 2118 08/15/20 0132 08/15/20 0531 08/15/20 0937  BP: (!) 138/94 130/86 124/89 124/82  Pulse: 96 98 (!) 103 96  Resp: 18 16  18   Temp: 98.3 F (36.8 C) 98.9 F (37.2 C) 98.7 F (37.1 C) 97.8 F (36.6 C)  TempSrc: Oral Oral Oral Oral  SpO2: 100% 100% 98% 95%  Weight:  91.6 kg      Intake/Output Summary (Last 24 hours) at 08/15/2020 1413 Last data filed at 08/15/2020 1300 Gross per 24 hour  Intake 600 ml  Output 1085 ml  Net -485 ml   Filed Weights   08/15/20 0132  Weight: 91.6 kg  Examination:  General exam: Elderly chronically ill male, laying in bed, awake alert oriented to self and partly to place, cognitive deficits noted CVS: S1-S2, regular rate rhythm Lungs: Decreased breath sounds at both bases Abdomen: Soft, distended, positive fluid  thrill, bowel sounds present Extremities: 1-2+ edema  Psychiatry: Poor insight    Data Reviewed:   CBC: Recent Labs  Lab 08/14/20 1020 08/14/20 1149 08/15/20 0329  WBC 6.8  --  8.8  NEUTROABS 5.4  --   --   HGB 9.6* 10.9* 10.3*  HCT 30.1* 32.0* 30.1*  MCV 80.7  --  74.3*  PLT 145*  --  932*   Basic Metabolic Panel: Recent Labs  Lab 08/14/20 1020 08/14/20 1149 08/15/20 0329  NA 140 143 142  K 3.3* 3.2* 4.4  CL 108  --  110  CO2 19*  --  17*  GLUCOSE 143*  --  98  BUN 16  --  18  CREATININE 1.33*  --  1.44*  CALCIUM 8.1*  --  8.1*   GFR: Estimated Creatinine Clearance: 51.1 mL/min (A) (by C-G formula based on SCr of 1.44 mg/dL (H)). Liver Function Tests: Recent Labs  Lab 08/14/20 1020 08/15/20 0329  AST 48* 616*  ALT 18 30  ALKPHOS 185* 166*  BILITOT 1.4* 1.3*  PROT 6.6 6.1*  ALBUMIN 2.2* 1.8*   Recent Labs  Lab 08/14/20 1020  LIPASE 19   Recent Labs  Lab 08/14/20 1228  AMMONIA 52*   Coagulation Profile: Recent Labs  Lab 08/14/20 1020  INR 1.4*   Cardiac Enzymes: No results for input(s): CKTOTAL, CKMB, CKMBINDEX, TROPONINI in the last 168 hours. BNP (last 3 results) No results for input(s): PROBNP in the last 8760 hours. HbA1C: No results for input(s): HGBA1C in the last 72 hours. CBG: Recent Labs  Lab 08/14/20 2110  GLUCAP 98   Lipid Profile: No results for input(s): CHOL, HDL, LDLCALC, TRIG, CHOLHDL, LDLDIRECT in the last 72 hours. Thyroid Function Tests: No results for input(s): TSH, T4TOTAL, FREET4, T3FREE, THYROIDAB in the last 72 hours. Anemia Panel: No results for input(s): VITAMINB12, FOLATE, FERRITIN, TIBC, IRON, RETICCTPCT in the last 72 hours. Urine analysis:    Component Value Date/Time   COLORURINE YELLOW 08/14/2020 1135   APPEARANCEUR CLOUDY (A) 08/14/2020 1135   LABSPEC 1.005 08/14/2020 1135   PHURINE 6.0 08/14/2020 1135   GLUCOSEU NEGATIVE 08/14/2020 1135   HGBUR LARGE (A) 08/14/2020 1135   BILIRUBINUR NEGATIVE  08/14/2020 1135   KETONESUR NEGATIVE 08/14/2020 1135   PROTEINUR NEGATIVE 08/14/2020 1135   UROBILINOGEN 0.2 10/22/2014 2058   NITRITE NEGATIVE 08/14/2020 1135   LEUKOCYTESUR LARGE (A) 08/14/2020 1135   Sepsis Labs: @LABRCNTIP (procalcitonin:4,lacticidven:4)  ) Recent Results (from the past 240 hour(s))  Urine culture     Status: None (Preliminary result)   Collection Time: 08/14/20 10:30 AM   Specimen: In/Out Cath Urine  Result Value Ref Range Status   Specimen Description IN/OUT CATH URINE  Final   Special Requests NONE  Final   Culture   Final    CULTURE REINCUBATED FOR BETTER GROWTH Performed at [facility], [place] [address], [place], [place] [address]    Report Status PENDING  Incomplete  Blood culture (routine x 2)     Status: None (Preliminary result)   Collection Time: 08/14/20 11:03 AM   Specimen: BLOOD  Result Value Ref Range Status   Specimen Description BLOOD SITE NOT SPECIFIED  Final   Special Requests   Final  BOTTLES DRAWN AEROBIC AND ANAEROBIC Blood Culture results may not be optimal due to an inadequate volume of blood received in culture bottles   Culture   Final    NO GROWTH < 24 HOURS Performed at [facility] [address], [place], [place] [address]    Report Status PENDING  Incomplete  Respiratory Panel by RT PCR (Flu A&B, Covid) - Nasopharyngeal Swab     Status: None   Collection Time: 08/14/20 11:58 AM   Specimen: Nasopharyngeal Swab; Nasopharyngeal(NP) swabs in vial transport medium  Result Value Ref Range Status   SARS Coronavirus 2 by RT PCR NEGATIVE NEGATIVE Final    Comment: (NOTE) SARS-CoV-2 target nucleic acids are NOT DETECTED.  The SARS-CoV-2 RNA is generally detectable in upper respiratoy specimens during the acute phase of infection. The lowest concentration of SARS-CoV-2 viral copies this assay can detect is 131 copies/mL. A negative result does not preclude SARS-Cov-2 infection and should not be used as the sole basis  for treatment or other patient management decisions. A negative result may occur with  improper specimen collection/handling, submission of specimen other than nasopharyngeal swab, presence of viral mutation(s) within the areas targeted by this assay, and inadequate number of viral copies (<131 copies/mL). A negative result must be combined with clinical observations, patient history, and epidemiological information. The expected result is Negative.  Fact Sheet for Patients:  PinkCheek.be  Fact Sheet for Healthcare Providers:  GravelBags.it  This test is no t yet approved or cleared by the [place] FDA and  has been authorized for detection and/or diagnosis of SARS-CoV-2 by FDA under an Emergency Use Authorization (EUA). This EUA will remain  in effect (meaning this test can be used) for the duration of the COVID-19 declaration under Section 564(b)(1) of the Act, 21 U.S.C. section 360bbb-3(b)(1), unless the authorization is terminated or revoked sooner.     Influenza A by PCR NEGATIVE NEGATIVE Final   Influenza B by PCR NEGATIVE NEGATIVE Final    Comment: (NOTE) The Xpert Xpress SARS-CoV-2/FLU/RSV assay is intended as an aid in  the diagnosis of influenza from Nasopharyngeal swab specimens and  should not be used as a sole basis for treatment. Nasal washings and  aspirates are unacceptable for Xpert Xpress SARS-CoV-2/FLU/RSV  testing.  Fact Sheet for Patients: PinkCheek.be  Fact Sheet for Healthcare Providers: GravelBags.it  This test is not yet approved or cleared by the [place] FDA and  has been authorized for detection and/or diagnosis of SARS-CoV-2 by  FDA under an Emergency Use Authorization (EUA). This EUA will remain  in effect (meaning this test can be used) for the duration of the  Covid-19 declaration under Section 564(b)(1) of the Act, 21    U.S.C. section 360bbb-3(b)(1), unless the authorization is  terminated or revoked. Performed at [facility], [place] [address], [place], [place] [address]   Blood culture (routine x 2)     Status: None (Preliminary result)   Collection Time: 08/14/20  9:34 PM   Specimen: BLOOD RIGHT HAND  Result Value Ref Range Status   Specimen Description BLOOD RIGHT HAND  Final   Special Requests   Final    BOTTLES DRAWN AEROBIC AND ANAEROBIC Blood Culture adequate volume   Culture   Final    NO GROWTH < 12 HOURS Performed at [facility], [place] [address] [place], [place] [address]    Report Status PENDING  Incomplete         Radiology  Studies: CT Head Wo Contrast  Result Date: 08/14/2020 CLINICAL DATA:  Delirium. EXAM: CT HEAD WITHOUT CONTRAST TECHNIQUE: Contiguous axial images were obtained from the base of the skull through the vertex without intravenous contrast. COMPARISON:  06/05/2020. FINDINGS: Brain: No evidence of acute large vascular territory infarction, hemorrhage, hydrocephalus, extra-axial collection or mass lesion/mass effect. Patchy white matter hypodensities, likely related to chronic microvascular ischemic disease. Age congruent cerebral volume loss. Vascular: Calcific atherosclerosis. Skull: No acute fracture. Sinuses/Orbits: Mild ethmoid air cell mucosal thickening. No air-fluid levels. Unremarkable orbits. Other: No mastoid effusions. IMPRESSION: 1. No evidence of acute intracranial abnormality. 2. Chronic microvascular ischemic disease. Electronically Signed   By: Margaretha Sheffield MD   On: 08/14/2020 14:49   CT Abdomen Pelvis W Contrast  Result Date: 08/14/2020 CLINICAL DATA:  Nausea vomiting, diarrhea and syncopal episode. History of neuroendocrine tumor. EXAM: CT ABDOMEN AND PELVIS WITH CONTRAST TECHNIQUE: Multidetector CT imaging of the abdomen and pelvis was performed using the standard protocol following bolus administration of intravenous contrast. CONTRAST:   152mL OMNIPAQUE IOHEXOL 300 MG/ML  SOLN COMPARISON:  Multiple prior studies including a CT from September 142021 and June 06, 2020 FINDINGS: Lower chest: Small RIGHT-sided pleural effusion. This is increased in size slightly since the prior study and is associated with RIGHT basilar airspace disease. Basilar atelectasis in the LEFT chest. No pericardial effusion. Distal esophageal thickening similar to prior studies. Hepatobiliary: Diffuse infiltration of the liver with tumor, areas of calcification have developed over time, in the relatively short interval since June 06, 2020 there are innumerable areas of calcification and decreased size of discrete masslike areas in the liver since the study of June 05, 2020, for instance on image 34 of series 10 a lesion with peripheral calcification measuring approximately 16 mm previously 24 mm. There is still however extensive disease throughout the liver on the current exam. The portal vein is patent into the liver. Hepatic veins are patent. Pancreas: Pancreatic atrophy. No pancreatic ductal dilation or inflammation. Spleen: Spleen normal in size. Adrenals/Urinary Tract: Adrenal glands are normal. Kidneys without hydronephrosis. Stable LEFT renal cyst. Urinary bladder decompressed secondary to Foley catheter. Stomach/Bowel: Patulous distal esophagus. No small bowel dilation in the proximal small bowel, beginning in the mid small bowel in areas subtended by the mesentery involved by the central mesenteric mass there is mural stratification, and perienteric stranding that is similar to the study of June 06, 2020. No sign of pneumatosis. Mesenteric stranding peripheral to the mass in the central mesentery is similar. RIGHT colonic thickening is similar as well. Central mesenteric mass measuring approximately 3.5 x 2.4 cm previously as large as 3.8 cm but changes in mesenteric position could alter measurements in the axial plane, measurements in the coronal  plane in the range of 5 cm are similar to the prior study. Vascular/Lymphatic: Calcified atheromatous plaque scattered about the abdominal aorta. There is no gastrohepatic or hepatoduodenal ligament lymphadenopathy. No retroperitoneal or mesenteric lymphadenopathy. In case mint of mesenteric vasculature with similar appearance. Venous collateral pathways in the central abdomen also similar. Reproductive: Heterogeneous prostate, nonspecific finding on CT. No pelvic sidewall lymphadenopathy. Other: Moderate volume ascites is slightly increased from the prior exam. Ascites was present but is increased in volume particularly in the pelvis. Musculoskeletal: No acute bone finding. No destructive bone process. IMPRESSION: 1. Increasing ascites with similar appearance of small bowel thickening and perienteric stranding seen on the previous study in the setting of central mesenteric mass that encases mesenteric vasculature. Correlation with lactate may  be helpful to exclude the possibility of worsening and or developing ischemia of bowel given vascular encasement. 2. Diffuse infiltration of the liver, some of these areas actually appears smaller than on previous imaging or more confluent. Significance uncertain given the diffuse nature of disease seen on the current exam. 3. Small RIGHT-sided pleural effusion is increased in size slightly since the prior study and is associated with RIGHT basilar airspace disease. 4. Stable LEFT pleural based nodule. 5. Aortic atherosclerosis. Aortic Atherosclerosis (ICD10-I70.0). Electronically Signed   By: Zetta Bills M.D.   On: 08/14/2020 15:37   DG Chest Port 1 View  Result Date: 08/14/2020 CLINICAL DATA:  Sepsis EXAM: PORTABLE CHEST 1 VIEW COMPARISON:  06/21/2020 FINDINGS: Stable cardiomediastinal contours. Low lung volumes with persistent elevation of the right hemidiaphragm. Mild streaky bibasilar opacities. No appreciable pleural fluid collection. No pneumothorax.  Degenerative changes of the shoulders. IMPRESSION: Low lung volumes with streaky bibasilar opacities, likely atelectasis. Electronically Signed   By: Davina Poke D.O.   On: 08/14/2020 11:07        Scheduled Meds: . amLODipine  5 mg Oral QHS  . Chlorhexidine Gluconate Cloth  6 each Topical Daily  . dorzolamide-timolol  1 drop Left Eye BID  . finasteride  5 mg Oral QHS  . heparin  5,000 Units Subcutaneous Q8H  . latanoprost  1 drop Left Eye QHS  . sodium chloride flush  3 mL Intravenous Q12H  . tamsulosin  0.4 mg Oral QPC supper   Continuous Infusions: . lactated ringers 100 mL/hr at 08/15/20 1159     LOS: 1 day    Time spent: 68min  Domenic Polite, MD [facility] 08/15/2020, 2:13 PM

## 2020-08-15 NOTE — Consult Note (Signed)
Consultation Note Date: 08/15/2020   Patient Name: Charles Schmidt  DOB: April 10, 1941  MRN: [id number]  Age / Sex: 79 y.o., male  PCP: Nolene Ebbs, MD Referring Physician: Domenic Polite, MD  Reason for Consultation: Establishing goals of care  HPI/Patient Profile: 79 y.o. male  with past medical history of metastatic carcinoid tumor, BPH s/p laser vaporization, and HTN presenting to the emergency department on 08/14/2020 with syncopal episode and vomiting. Upon EMS arrival, his systolic BP was in the 98'Y, he was given IV fluid and zofran.  In the ED, patient states to the admitting physician "give me morphine, let me die". Labs reveal hypokalemia, creatinine 1.33, lactic acid 3.5 down to 2.4. Chest x-ray showed bibasilar atelectasis. CT head without acute abnormality. CT abdomen shows increased ascites, small bowel thickening and perienteric stranding seen on previous study in the setting of central mesenteric mass that encases mesenteric vasculature.   Patient is followed by outpatient oncology - Dr. Benay Spice.   Palliative was consulted to assist with goals of care discussions in the setting of advanced cancer and declining health state. Patient is known to PMT from his recent hospitalization 06/05/20 to 07/05/20.   Clinical Assessment and Goals of Care: I have reviewed medical records including EPIC notes, labs and imaging, and met at bedside with patient and daughter Charles Schmidt)  to discuss diagnosis, prognosis, GOC, EOL wishes, disposition, and options.  I introduced Palliative Medicine as specialized medical care for people living with serious illness. It focuses on providing relief from the symptoms and stress of a serious illness.   Patient states "give me morphine and let me die". As we discussed this further, patient reports he is having frequent and uncontrolled diarrhea that is making him  "miserable".  Patient has 3 children: [name] who lives in [place], [place] who lives in [place], and [place] who lives in [place]. 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   Patient has been at [facility] for rehab since his last hospitalization. Charles Schmidt states he seemed to be improving and doing well with therapy.    We discussed his current illness and what it means in the larger context of his ongoing co-morbidities.  Natural disease trajectory of advanced cancer was discussed.  I attempted to elicit values and goals of care important to the patient. His only goal is to manage the symptoms of diarrhea. I expressed concern that the diarrhea is due to his advanced illness and has continued despite treatment with lanreotide and several anti-diarrheal medications.   The difference between aggressive medical intervention and comfort care was considered in light of the patient's goals of care.  I introduced the concept of a comfort path to patient and daughter and encouraged them to think about at what point they would want to focus on quality of life rather than prolonging life.   Charles Schmidt asks her dad if he "wants to keep fighting" or wants focus on comfort. Patient states "comfort", then falls asleep shortly after. Charles Schmidt states she will respect his wish  if that is what he truly desires - but wants to speak with her siblings before any decisions are made. She also requests any treatment possible to help with management of the diarrhea. I provided reassurance I would discuss with the medical team.   We also discussed hospice philosophy - I provided information on home/facility versus residential hospice services - answered all questions. Daughter seems open to [facility] if they pursue a comfort path.   Patient has fallen asleep during the visit. Questions and concerns were addressed. Daughter was given my contact info encouraged to call with questions or concerns.    Primary decision maker: patient, with support from his children  SUMMARY OF RECOMMENDATIONS   - DNR/DNI as previously documented - patient stating he wants to die, because the diarrhea is making him "miserable" - discussed that the diarrhea is caused by advanced disease and has been refractory to treatment so far - recommend re-starting anti-diarrheal medications (Imodium, Lomotil, Questran) - recommend starting octreotide for comfort - patient has stated he wants comfort care, however family seems to be optimistic for improvement - PMT will continue to follow  Code Status/Advance Care Planning:  DNR  Symptom Management:   Octreotide (IV or SQ)  Palliative Prophylaxis:   Frequent Pain Assessment and Turn Reposition  Psycho-social/Spiritual:   Created space and opportunity for patient and family to express thoughts and feelings regarding patient's current medical situation.   Emotional support provided  Prognosis:   poor  Discharge Planning: To Be Determined      Primary Diagnoses: Present on Admission: . Syncope . Benign prostatic hyperplasia . Bowel wall thickening . DNR (do not resuscitate) . Essential hypertension (primary) . Metastatic malignant neuroendocrine tumor to liver ([place]) . Nausea and vomiting in adult . Hypokalemia   I have reviewed the medical record, interviewed the patient and family, and examined the patient. The following aspects are pertinent.  Past Medical History:  Diagnosis Date  . Bladder diverticulum 06/06/2020  . BPH with obstruction/lower urinary tract symptoms   . Cataract, mature    12-28-2017  per pt right eye, legally blind  . Diarrhea concurrent with and due to carcinoid syndrome ([place])    12-28-2017 improvement since started Sandostatin monthly  . ED (erectile dysfunction)   . Elevated PSA   . Family history of cancer in son   . Family history of prostate cancer   . Foley catheter in place   . Glaucoma, left eye   .  History of colon polyps   . History of urinary retention    2015; 2016; 01/ 2019  . Hypertension   . Metastatic malignant neuroendocrine tumor to liver [facility]) oncoloigst-  dr Benay Spice   dx 10-23-2017 by liver bx-- WHO grade 2; small bowel mesenteric mass, cirrhosis, mets left lower lobe solitary nodule ,  periaortic and paraspinal lymph nodes METS--  started monthly Sandostatin 11-20-2017  . Microcytic anemia   . RBBB (right bundle branch block with left anterior fascicular block)   . Urinary retention   . Venous reflux      Family History  Problem Relation Age of Onset  . Hypertension Mother   . Stroke Father   . Prostate cancer Father        'slow growing'  . Kidney Stones Sister   . Cancer Son 90       neuroendocrine tumors of small intestine   Scheduled Meds: . amLODipine  5 mg Oral QHS  . Chlorhexidine Gluconate Cloth  6 each  Topical Daily  . dorzolamide-timolol  1 drop Left Eye BID  . finasteride  5 mg Oral QHS  . heparin  5,000 Units Subcutaneous Q8H  . latanoprost  1 drop Left Eye QHS  . sodium chloride flush  3 mL Intravenous Q12H  . tamsulosin  0.4 mg Oral QPC supper   Continuous Infusions: . lactated ringers 100 mL/hr at 08/15/20 1159   PRN Meds:.fentaNYL (SUBLIMAZE) injection, metoprolol tartrate, ondansetron **OR** ondansetron (ZOFRAN) IV, oxyCODONE, polyethylene glycol, traMADol Medications Prior to Admission:  Prior to Admission medications   Medication Sig Start Date End Date Taking? Authorizing Provider  acetaminophen (TYLENOL) 325 MG tablet Take 2 tablets (650 mg total) by mouth every 6 (six) hours as needed for moderate pain, fever or headache. 07/03/20  Yes Martyn Malay, MD  alum & mag hydroxide-simeth (MAALOX/MYLANTA) 200-200-20 MG/5ML suspension Take 30 mLs by mouth every 6 (six) hours as needed for indigestion or heartburn. 07/03/20  Yes Martyn Malay, MD  amLODipine (NORVASC) 5 MG tablet Take 1 tablet (5 mg total) by mouth at bedtime. 05/01/18  Yes  Jacqualine Mau, NP  cholestyramine light (PREVALITE) 4 g packet Take 4 g by mouth 2 (two) times daily.   Yes [provider]  diphenoxylate-atropine (LOMOTIL) 2.5-0.025 MG tablet Take 1 tablet by mouth 4 (four) times daily. 07/03/20  Yes Martyn Malay, MD  dorzolamide-timolol (COSOPT) 22.3-6.8 MG/ML ophthalmic solution Place 1 drop into the left eye 2 (two) times daily.    Yes [provider]  finasteride (PROSCAR) 5 MG tablet Take 5 mg by mouth at bedtime.    Yes [provider]  liver oil-zinc oxide (BOUDREAUXS BUTT PASTE) 40 % ointment Apply 1 application topically 2 (two) times daily at 8 am and 10 pm.   Yes [provider]  loperamide (IMODIUM A-D) 2 MG tablet Take 2 mg by mouth every 6 (six) hours as needed for diarrhea or loose stools.   Yes [provider]  loperamide (IMODIUM) 2 MG capsule Take 2-4 mg by mouth 3 (three) times daily.    Yes [provider]  Multiple Vitamins-Minerals (CENTRUM SILVER) tablet Take 1 tablet by mouth at bedtime.   Yes [provider]  ondansetron (ZOFRAN) 4 MG tablet Take 1 tablet (4 mg total) by mouth every 6 (six) hours as needed for nausea. 07/03/20  Yes Martyn Malay, MD  potassium chloride (MICRO-K) 10 MEQ CR capsule Take 10 mEq by mouth 2 (two) times daily.   Yes [provider]  Saw Palmetto, Serenoa repens, (SAW PALMETTO PO) Take 160 mg by mouth at bedtime.    Yes [provider]  sodium bicarbonate 650 MG tablet Take 2 tablets (1,300 mg total) by mouth 3 (three) times daily. 07/03/20  Yes Martyn Malay, MD  tamsulosin (FLOMAX) 0.4 MG CAPS capsule Take 1 capsule (0.4 mg total) by mouth daily after supper. 07/03/20  Yes Martyn Malay, MD  Travoprost, BAK Free, (TRAVATAN) 0.004 % SOLN ophthalmic solution Place 1 drop into the left eye at bedtime.    Yes [provider]  Nystatin (GERHARDT'S BUTT CREAM) CREA Apply 1 application topically 2 (two) times  daily. Apply to sacrum and scrotum Patient not taking: Reported on 08/14/2020 07/03/20   Martyn Malay, MD   No Known Allergies Review of Systems  Gastrointestinal: Positive for diarrhea.  Neurological: Positive for weakness.    Physical Exam Constitutional:      General: He is not in acute distress.  Appearance: He is ill-appearing.  Cardiovascular:     Rate and Rhythm: Normal rate.  Pulmonary:     Effort: Pulmonary effort is normal.  Abdominal:     General: There is distension.  Neurological:     Mental Status: He is alert and oriented to person, place, and time.     Vital Signs: BP 124/82 (BP Location: Left Arm)   Pulse 96   Temp 97.8 F (36.6 C) (Oral)   Resp 18   Wt 91.6 kg   SpO2 95%   BMI 24.58 kg/m  Pain Scale: 0-10   Pain Score: 0-No pain   SpO2: SpO2: 95 % O2 Device:SpO2: 95 % O2 Flow Rate: .   IO: Intake/output summary:   Intake/Output Summary (Last 24 hours) at 08/15/2020 1257 Last data filed at 08/15/2020 0830 Gross per 24 hour  Intake 240 ml  Output 1085 ml  Net -845 ml    LBM: Last BM Date: 08/15/20 Baseline Weight: Weight: 91.6 kg Most recent weight: Weight: 91.6 kg      Palliative Assessment/Data: PPS 30%     Time In: 13:00 Time Out: 14:14 Time Total: 74 minutes Greater than 50%  of this time was spent counseling and coordinating care related to the above assessment and plan.  Signed by: Lavena Bullion, NP   Please contact Palliative Medicine Team phone at [phone] for questions and concerns.  For individual provider: See Shea Evans

## 2020-08-16 DIAGNOSIS — E43 Unspecified severe protein-calorie malnutrition: Secondary | ICD-10-CM | POA: Insufficient documentation

## 2020-08-16 DIAGNOSIS — I959 Hypotension, unspecified: Secondary | ICD-10-CM

## 2020-08-16 LAB — CBC
HCT: 26.3 % — ABNORMAL LOW (ref 39.0–52.0)
Hemoglobin: 8.9 g/dL — ABNORMAL LOW (ref 13.0–17.0)
MCH: 25.2 pg — ABNORMAL LOW (ref 26.0–34.0)
MCHC: 33.8 g/dL (ref 30.0–36.0)
MCV: 74.5 fL — ABNORMAL LOW (ref 80.0–100.0)
Platelets: 110 10*3/uL — ABNORMAL LOW (ref 150–400)
RBC: 3.53 MIL/uL — ABNORMAL LOW (ref 4.22–5.81)
RDW: 18.6 % — ABNORMAL HIGH (ref 11.5–15.5)
WBC: 6.6 10*3/uL (ref 4.0–10.5)
nRBC: 0 % (ref 0.0–0.2)

## 2020-08-16 LAB — BASIC METABOLIC PANEL
Anion gap: 11 (ref 5–15)
BUN: 23 mg/dL (ref 8–23)
CO2: 21 mmol/L — ABNORMAL LOW (ref 22–32)
Calcium: 7.8 mg/dL — ABNORMAL LOW (ref 8.9–10.3)
Chloride: 110 mmol/L (ref 98–111)
Creatinine, Ser: 1.57 mg/dL — ABNORMAL HIGH (ref 0.61–1.24)
GFR, Estimated: 45 mL/min — ABNORMAL LOW (ref >60)
Glucose, Bld: 119 mg/dL — ABNORMAL HIGH (ref 70–99)
Potassium: 3.9 mmol/L (ref 3.5–5.1)
Sodium: 142 mmol/L (ref 135–145)

## 2020-08-16 NOTE — Progress Notes (Signed)
Daily Progress Note   Patient Name: Charles Schmidt       Date: 08/16/2020 DOB: [birth year]  Age: 79 y.o. MRN#: [id number] Attending Physician: Domenic Polite, MD Primary Care Physician: Nolene Ebbs, MD Admit Date: 08/14/2020  Reason for Follow-up: continued GOC discussion  Subjective: Received report from primary RN - she reports patient has not had any episodes of diarrhea so far today.  Patient is alert, willing to engage in conversation, but gets easily frustrated. Son [name] is at bedside. Patient is telling [name] to "let him go".  [name] shares this situation has been very difficult for him. He also shares that he has perhaps impeded the patient's wishes in the past, pushing his father to keep going and to progress. He states he intends not to do that moving forward, and wants to honor his father's wishes.   We discuss the patient's diagnosis, prognosis, and previous hospitalization. [name] expresses his opinion that his father is in better shape than he was during the previous hospitalization. [name] feels that his father was improving in rehab.  I provided counseling on the illness trajectory of advanced cancer and discussed that there can be intermittent periods of improvement, but often in the setting of overall decline.   I introduced the concept of a comfort path to [facility] and encouraged him to think about at what point they would want to shift the focus to comfort and dignity rather than prolonging life.   Introduced hospice philosophy and information on home vs residential hospice services - answered all questions.  [name] expresses his concern that prognosis is unclear. He does not agree that prognosis is limited to a few weeks. He seems open to [facility], but feels his father has a  limited prognosis that would be eligible for residential hospice care.     Length of Stay: 2  Current Medications: Scheduled Meds:  . amLODipine  5 mg Oral QHS  . Chlorhexidine Gluconate Cloth  6 each Topical Daily  . cholestyramine  4 g Oral BID  . diphenoxylate-atropine  1 tablet Oral QID  . dorzolamide-timolol  1 drop Left Eye BID  . feeding supplement  1 Container Oral TID BM  . finasteride  5 mg Oral QHS  . heparin  5,000 Units Subcutaneous Q8H  . latanoprost  1 drop Left Eye QHS  .  loperamide  2 mg Oral BID  . octreotide  150 mcg Subcutaneous Q12H  . sodium chloride flush  3 mL Intravenous Q12H  . tamsulosin  0.4 mg Oral QPC supper    Continuous Infusions:   PRN Meds: fentaNYL (SUBLIMAZE) injection, metoprolol tartrate, ondansetron **OR** ondansetron (ZOFRAN) IV, oxyCODONE, polyethylene glycol, traMADol  Physical Exam Vitals reviewed.  Constitutional:      General: He is not in acute distress.    Appearance: He is ill-appearing.  Cardiovascular:     Rate and Rhythm: Normal rate.  Pulmonary:     Effort: Pulmonary effort is normal.  Abdominal:     General: There is distension.  Neurological:     Mental Status: He is alert.     Comments: Oriented to person place situation             Vital Signs: BP 121/78 (BP Location: Right Arm)   Pulse 94   Temp 98 F (36.7 C) (Oral)   Resp 18   Ht 6\' 4"  (1.93 m)   Wt 91.6 kg   SpO2 98%   BMI 24.58 kg/m  SpO2: SpO2: 98 % O2 Device: O2 Device: Room Air O2 Flow Rate:    Intake/output summary:   Intake/Output Summary (Last 24 hours) at 08/16/2020 1309 Last data filed at 08/16/2020 0900 Gross per 24 hour  Intake 660 ml  Output 500 ml  Net 160 ml   LBM: Last BM Date: 08/15/20 (per report ) Baseline Weight: Weight: 91.6 kg Most recent weight: Weight: 91.6 kg       Palliative Assessment/Data:      Patient Active Problem List   Diagnosis Date Noted  . Protein-calorie malnutrition, severe 08/16/2020  .  Intractable vomiting   . Diarrhea   . Carcinoid syndrome ([place])   . Syncope 08/14/2020  . Acute metabolic encephalopathy 25/95/6387  . CKD (chronic kidney disease) stage 3, GFR 30-59 ml/min (HCC) 08/14/2020  . Staphylococcus epidermidis bacteremia   . Pressure injury of skin 06/12/2020  . Oliguria   . Palliative care by specialist   . Goals of care, counseling/discussion   . DNR (do not resuscitate)   . Acute renal failure ([place])   . Abdominal pain   . Elevated troponin   . Lives alone 06/06/2020  . Nonspecific elevation of levels of transaminase or lactic acid dehydrogenase (LDH) 06/06/2020  . Increased ammonia level 06/06/2020  . Direct hyperbilirubinemia 06/06/2020  . Bowel wall thickening 06/06/2020  . Vomiting, persistent, in adult 06/06/2020  . Anorexia 06/06/2020  . Colitis   . Elevated liver enzymes   . Nausea and vomiting in adult   . Dehydration   . Physical debility 06/05/2020  . Genetic testing 12/18/2017  . Metastatic malignant neuroendocrine tumor to liver ([place]) 10/28/2017  . Essential hypertension (primary) 05/04/2017  . Hypokalemia 05/04/2017  . Benign prostatic hyperplasia 05/04/2017  . Glaucoma 05/04/2017  . Benign hypertensive heart disease without heart failure 05/04/2017  . Microcytic anemia 05/04/2017  . Prediabetes 05/04/2017  . Hypocalcemia 05/04/2017  . Psoriasis 05/04/2017    Palliative Care Assessment & Plan   HPI/Patient Profile: 79 y.o. male  with past medical history of metastatic carcinoid tumor, BPH s/p laser vaporization, and HTN presenting to the emergency department on 08/14/2020 with syncopal episode and vomiting. Upon EMS arrival, his systolic BP was in the 56'E, he was given IV fluid and zofran.  In the ED, patient states to the admitting physician "give me morphine, let me die". Labs reveal hypokalemia,  creatinine 1.33, lactic acid 3.5 down to 2.4. Chest x-ray showed bibasilar atelectasis. CT head without acute abnormality. CT abdomen  shows increased ascites, small bowel thickening and perienteric stranding seen on previous study in the setting of central mesenteric mass that encases mesenteric vasculature.   Patient is followed by outpatient oncology - Dr. Benay Spice.   Palliative was consulted to assist with goals of care discussions in the setting of advanced cancer and declining health state. Patient is known to PMT from his recent hospitalization 06/05/20 to 07/05/20.   Assessment: - metastatic carcinoid tumor with central mesenteric mass, extensive liver metastasis, worsening malignant ascites - hypotension/hypovolemia - nausea/vomiting/diarrhea - CKD stage 3 - severe protein calorie malnutrition  Recommendations/Plan: - DNR/DNI - diarrhea is improved - awaiting input from oncology, Dr. Ammie Dalton - do not think patient will benefit from rehab - recommend hospice, home versus residential (not sure he is eligible for residential)  Goals of Care and Additional Recommendations:  Continue supportive care  Code Status: DNR/DNI  Prognosis:  poor  Discharge Planning:  To be determined  Care plan was discussed with Dr. Broadus John and primary RN  Thank you for allowing the Palliative Medicine Team to assist in the care of this patient.   Total Time 35 minutes Prolonged Time Billed  no      Greater than 50%  of this time was spent counseling and coordinating care related to the above assessment and plan.  Lavena Bullion, NP  Please contact Palliative Medicine Team phone at [phone] for questions and concerns.

## 2020-08-16 NOTE — Progress Notes (Signed)
PROGRESS NOTE    Charles Schmidt  TKP:[id number] DOB: Dec 22, 1940 DOA: 08/14/2020 PCP: Nolene Ebbs, MD  Brief Narrative: 79/M with widely metastatic carcinoid tumor, BPH, hypertension had a long hospital stay recently notable for bacteremia, diarrhea, failure to thrive, hospice was considered at the time however given clinical improvement he was discharged to rehab. -Sent to the ED from rehab with syncope, vomiting and hypotension.  When EMS arrived systolic blood pressure was in the 50s, he was given IV fluids. -On discussion patient requested hospice and wanted the pneumonia more more arousable of getting to the [place] to let him pass away. -Vital signs was subsequently stable, labs noted hypokalemia, mild AKI, mildly elevated lactic acidosis, CT abdomen pelvis noted increase ascites, small bowel thickening and perinephric stranding.  Assessment & Plan:   Hypotension Hypovolemia -Likely secondary to ongoing GI losses, chronic diarrhea and intermittent vomiting -Hydrated with saline, blood pressure improved, lactic acidosis has improved as well -stable, po intake remains poor  Nausea/Vomiting/Diarrhea -Sepsis ruled out, without fever or leukocytosis tachypnea or tachycardia -CT abdomen pelvis noted small bowel thickening and could not rule out mesenteric ischemia, this is somewhat unchanged from prior CT back in September, seen by general surgery then as well and recommended palliative care -Supportive care-restart Questran, Lomotil, Imodium and octreotide -diarrhea improving -overall prognosis is very poor  Widely metastatic carcinoid tumor with central mesenteric mass, extensive liver metastasis, worsening malignant ascites -CT with disease progression, his albumin is 1.8 with third spacing -In my opinion his prognosis is very poor, called and updated patients daughter regarding this and that hospice is most appropriate -Palliative care following -Restarted Lomotil, Imodium, Questran  and octreotide -await input from Dr.Sherril, to discuss prognosis, I think rehab will have very little to offer him at this stage of his illness  Chronic kidney disease stage IIIa -Baseline creatinine around 1.2,  -slight worsening, monitor  Severe protein calorie malnutrition 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  BPH  -continue Proscar and Flomax  Goals of care -Patient is debilitated by advanced metastatic carcinoid tumor with extensive liver mets, worsening malignant ascites, ongoing failure to thrive, persistent diarrhea despite maximal medical therapy -Palliative consulted for goals of care, appreciate input, I recommended hospice to patients daughter and symptom focused care -In my opinion he would be most appropriate for hospice -Requested oncology input as well  DVT prophylaxis: Subcu heparin Code Status: DNR Family Communication: updated daughter 11/24 Disposition Plan:  Status is: Inpatient  Remains inpatient appropriate because:Inpatient level of care appropriate due to severity of illness   Dispo: The patient is from: SNF              Anticipated d/c is to: to be determined              Anticipated d/c date is: TBD              Patient currently is not medically stable to d/c.  Consultants: Palliative medicine, oncology   Procedures:   Antimicrobials:    Subjective: -Reports mild improvement in diarrhea, very poor p.o. intake, drinking a few sips of water  Objective: Vitals:   08/15/20 2038 08/16/20 0257 08/16/20 0428 08/16/20 0938  BP: 119/78  114/76 121/78  Pulse: 99  92 94  Resp: 16  18 18   Temp: 99.4 F (37.4 C)  97.7 F (36.5 C) 98 F (36.7 C)  TempSrc: Oral  Oral Oral  SpO2: 96%  96% 98%  Weight:  91.6 kg    Height:  6\' 4"  (1.93  m)      Intake/Output Summary (Last 24 hours) at 08/16/2020 1252 Last data filed at 08/16/2020 0900 Gross per 24 hour  Intake 1020 ml  Output 500 ml  Net 520 ml   Filed Weights   08/15/20 0132 08/16/20 0257  Weight: 91.6  kg 91.6 kg    Examination:  General exam: Elderly chronically ill male, laying in bed, awake alert oriented to self and partly to place, cognitive deficits noted CVS: S1-S2, regular rate rhythm Lungs: Decreased breath sounds the bases Abdomen: Soft, nontender, mildly distended, bowel sounds present Extremities, 2+ edema  Psychiatry: Poor insight    Data Reviewed:   CBC: Recent Labs  Lab 08/14/20 1020 08/14/20 1149 08/15/20 0329 08/16/20 0158  WBC 6.8  --  8.8 6.6  NEUTROABS 5.4  --   --   --   HGB 9.6* 10.9* 10.3* 8.9*  HCT 30.1* 32.0* 30.1* 26.3*  MCV 80.7  --  74.3* 74.5*  PLT 145*  --  111* 277*   Basic Metabolic Panel: Recent Labs  Lab 08/14/20 1020 08/14/20 1149 08/15/20 0329 08/16/20 0158  NA 140 143 142 142  K 3.3* 3.2* 4.4 3.9  CL 108  --  110 110  CO2 19*  --  17* 21*  GLUCOSE 143*  --  98 119*  BUN 16  --  18 23  CREATININE 1.33*  --  1.44* 1.57*  CALCIUM 8.1*  --  8.1* 7.8*   GFR: Estimated Creatinine Clearance: 46.8 mL/min (A) (by C-G formula based on SCr of 1.57 mg/dL (H)). Liver Function Tests: Recent Labs  Lab 08/14/20 1020 08/15/20 0329  AST 48* 616*  ALT 18 30  ALKPHOS 185* 166*  BILITOT 1.4* 1.3*  PROT 6.6 6.1*  ALBUMIN 2.2* 1.8*   Recent Labs  Lab 08/14/20 1020  LIPASE 19   Recent Labs  Lab 08/14/20 1228  AMMONIA 52*   Coagulation Profile: Recent Labs  Lab 08/14/20 1020  INR 1.4*   Cardiac Enzymes: No results for input(s): CKTOTAL, CKMB, CKMBINDEX, TROPONINI in the last 168 hours. BNP (last 3 results) No results for input(s): PROBNP in the last 8760 hours. HbA1C: No results for input(s): HGBA1C in the last 72 hours. CBG: Recent Labs  Lab 08/14/20 2110  GLUCAP 98   Lipid Profile: No results for input(s): CHOL, HDL, LDLCALC, TRIG, CHOLHDL, LDLDIRECT in the last 72 hours. Thyroid Function Tests: No results for input(s): TSH, T4TOTAL, FREET4, T3FREE, THYROIDAB in the last 72 hours. Anemia Panel: No results for  input(s): VITAMINB12, FOLATE, FERRITIN, TIBC, IRON, RETICCTPCT in the last 72 hours. Urine analysis:    Component Value Date/Time   COLORURINE YELLOW 08/14/2020 1135   APPEARANCEUR CLOUDY (A) 08/14/2020 1135   LABSPEC 1.005 08/14/2020 1135   PHURINE 6.0 08/14/2020 1135   GLUCOSEU NEGATIVE 08/14/2020 1135   HGBUR LARGE (A) 08/14/2020 1135   BILIRUBINUR NEGATIVE 08/14/2020 1135   KETONESUR NEGATIVE 08/14/2020 1135   PROTEINUR NEGATIVE 08/14/2020 1135   UROBILINOGEN 0.2 10/22/2014 2058   NITRITE NEGATIVE 08/14/2020 1135   LEUKOCYTESUR LARGE (A) 08/14/2020 1135   Sepsis Labs: @LABRCNTIP (procalcitonin:4,lacticidven:4)  ) Recent Results (from the past 240 hour(s))  Urine culture     Status: Abnormal (Preliminary result)   Collection Time: 08/14/20 10:30 AM   Specimen: In/Out Cath Urine  Result Value Ref Range Status   Specimen Description IN/OUT CATH URINE  Final   Special Requests NONE  Final   Culture (A)  Final    >=100,000 COLONIES/mL PSEUDOMONAS  AERUGINOSA SUSCEPTIBILITIES TO FOLLOW Performed at [facility], [place] [address] [place], [place] [address]    Report Status PENDING  Incomplete  Blood culture (routine x 2)     Status: None (Preliminary result)   Collection Time: 08/14/20 11:03 AM   Specimen: BLOOD  Result Value Ref Range Status   Specimen Description BLOOD SITE NOT SPECIFIED  Final   Special Requests   Final    BOTTLES DRAWN AEROBIC AND ANAEROBIC Blood Culture results may not be optimal due to an inadequate volume of blood received in culture bottles   Culture   Final    NO GROWTH 2 DAYS Performed at [facility], [place] [address], [place], [place] [address]    Report Status PENDING  Incomplete  Respiratory Panel by RT PCR (Flu A&B, Covid) - Nasopharyngeal Swab     Status: None   Collection Time: 08/14/20 11:58 AM   Specimen: Nasopharyngeal Swab; Nasopharyngeal(NP) swabs in vial transport medium  Result Value Ref Range Status   SARS Coronavirus 2  by RT PCR NEGATIVE NEGATIVE Final    Comment: (NOTE) SARS-CoV-2 target nucleic acids are NOT DETECTED.  The SARS-CoV-2 RNA is generally detectable in upper respiratoy specimens during the acute phase of infection. The lowest concentration of SARS-CoV-2 viral copies this assay can detect is 131 copies/mL. A negative result does not preclude SARS-Cov-2 infection and should not be used as the sole basis for treatment or other patient management decisions. A negative result may occur with  improper specimen collection/handling, submission of specimen other than nasopharyngeal swab, presence of viral mutation(s) within the areas targeted by this assay, and inadequate number of viral copies (<131 copies/mL). A negative result must be combined with clinical observations, patient history, and epidemiological information. The expected result is Negative.  Fact Sheet for Patients:  PinkCheek.be  Fact Sheet for Healthcare Providers:  GravelBags.it  This test is no t yet approved or cleared by the [place] FDA and  has been authorized for detection and/or diagnosis of SARS-CoV-2 by FDA under an Emergency Use Authorization (EUA). This EUA will remain  in effect (meaning this test can be used) for the duration of the COVID-19 declaration under Section 564(b)(1) of the Act, 21 U.S.C. section 360bbb-3(b)(1), unless the authorization is terminated or revoked sooner.     Influenza A by PCR NEGATIVE NEGATIVE Final   Influenza B by PCR NEGATIVE NEGATIVE Final    Comment: (NOTE) The Xpert Xpress SARS-CoV-2/FLU/RSV assay is intended as an aid in  the diagnosis of influenza from Nasopharyngeal swab specimens and  should not be used as a sole basis for treatment. Nasal washings and  aspirates are unacceptable for Xpert Xpress SARS-CoV-2/FLU/RSV  testing.  Fact Sheet for Patients: PinkCheek.be  Fact Sheet  for Healthcare Providers: GravelBags.it  This test is not yet approved or cleared by the [place] FDA and  has been authorized for detection and/or diagnosis of SARS-CoV-2 by  FDA under an Emergency Use Authorization (EUA). This EUA will remain  in effect (meaning this test can be used) for the duration of the  Covid-19 declaration under Section 564(b)(1) of the Act, 21  U.S.C. section 360bbb-3(b)(1), unless the authorization is  terminated or revoked. Performed at [facility], [place] [address], [place], [place] [address]   Blood culture (routine x 2)     Status: None (Preliminary result)   Collection Time: 08/14/20  9:34 PM   Specimen: BLOOD RIGHT HAND  Result Value Ref  Range Status   Specimen Description BLOOD RIGHT HAND  Final   Special Requests   Final    BOTTLES DRAWN AEROBIC AND ANAEROBIC Blood Culture adequate volume   Culture   Final    NO GROWTH 2 DAYS Performed at [facility], [address] [place], [place] [address]    Report Status PENDING  Incomplete         Radiology Studies: CT Head Wo Contrast  Result Date: 08/14/2020 CLINICAL DATA:  Delirium. EXAM: CT HEAD WITHOUT CONTRAST TECHNIQUE: Contiguous axial images were obtained from the base of the skull through the vertex without intravenous contrast. COMPARISON:  06/05/2020. FINDINGS: Brain: No evidence of acute large vascular territory infarction, hemorrhage, hydrocephalus, extra-axial collection or mass lesion/mass effect. Patchy white matter hypodensities, likely related to chronic microvascular ischemic disease. Age congruent cerebral volume loss. Vascular: Calcific atherosclerosis. Skull: No acute fracture. Sinuses/Orbits: Mild ethmoid air cell mucosal thickening. No air-fluid levels. Unremarkable orbits. Other: No mastoid effusions. IMPRESSION: 1. No evidence of acute intracranial abnormality. 2. Chronic microvascular ischemic disease. Electronically Signed   By:  Margaretha Sheffield MD   On: 08/14/2020 14:49   CT Abdomen Pelvis W Contrast  Result Date: 08/14/2020 CLINICAL DATA:  Nausea vomiting, diarrhea and syncopal episode. History of neuroendocrine tumor. EXAM: CT ABDOMEN AND PELVIS WITH CONTRAST TECHNIQUE: Multidetector CT imaging of the abdomen and pelvis was performed using the standard protocol following bolus administration of intravenous contrast. CONTRAST:  187mL OMNIPAQUE IOHEXOL 300 MG/ML  SOLN COMPARISON:  Multiple prior studies including a CT from September 142021 and June 06, 2020 FINDINGS: Lower chest: Small RIGHT-sided pleural effusion. This is increased in size slightly since the prior study and is associated with RIGHT basilar airspace disease. Basilar atelectasis in the LEFT chest. No pericardial effusion. Distal esophageal thickening similar to prior studies. Hepatobiliary: Diffuse infiltration of the liver with tumor, areas of calcification have developed over time, in the relatively short interval since June 06, 2020 there are innumerable areas of calcification and decreased size of discrete masslike areas in the liver since the study of June 05, 2020, for instance on image 34 of series 10 a lesion with peripheral calcification measuring approximately 16 mm previously 24 mm. There is still however extensive disease throughout the liver on the current exam. The portal vein is patent into the liver. Hepatic veins are patent. Pancreas: Pancreatic atrophy. No pancreatic ductal dilation or inflammation. Spleen: Spleen normal in size. Adrenals/Urinary Tract: Adrenal glands are normal. Kidneys without hydronephrosis. Stable LEFT renal cyst. Urinary bladder decompressed secondary to Foley catheter. Stomach/Bowel: Patulous distal esophagus. No small bowel dilation in the proximal small bowel, beginning in the mid small bowel in areas subtended by the mesentery involved by the central mesenteric mass there is mural stratification, and  perienteric stranding that is similar to the study of June 06, 2020. No sign of pneumatosis. Mesenteric stranding peripheral to the mass in the central mesentery is similar. RIGHT colonic thickening is similar as well. Central mesenteric mass measuring approximately 3.5 x 2.4 cm previously as large as 3.8 cm but changes in mesenteric position could alter measurements in the axial plane, measurements in the coronal plane in the range of 5 cm are similar to the prior study. Vascular/Lymphatic: Calcified atheromatous plaque scattered about the abdominal aorta. There is no gastrohepatic or hepatoduodenal ligament lymphadenopathy. No retroperitoneal or mesenteric lymphadenopathy. In case mint of mesenteric vasculature with similar appearance. Venous collateral pathways in the central abdomen also similar. Reproductive: Heterogeneous prostate, nonspecific  finding on CT. No pelvic sidewall lymphadenopathy. Other: Moderate volume ascites is slightly increased from the prior exam. Ascites was present but is increased in volume particularly in the pelvis. Musculoskeletal: No acute bone finding. No destructive bone process. IMPRESSION: 1. Increasing ascites with similar appearance of small bowel thickening and perienteric stranding seen on the previous study in the setting of central mesenteric mass that encases mesenteric vasculature. Correlation with lactate may be helpful to exclude the possibility of worsening and or developing ischemia of bowel given vascular encasement. 2. Diffuse infiltration of the liver, some of these areas actually appears smaller than on previous imaging or more confluent. Significance uncertain given the diffuse nature of disease seen on the current exam. 3. Small RIGHT-sided pleural effusion is increased in size slightly since the prior study and is associated with RIGHT basilar airspace disease. 4. Stable LEFT pleural based nodule. 5. Aortic atherosclerosis. Aortic Atherosclerosis  (ICD10-I70.0). Electronically Signed   By: Zetta Bills M.D.   On: 08/14/2020 15:37   Scheduled Meds: . amLODipine  5 mg Oral QHS  . Chlorhexidine Gluconate Cloth  6 each Topical Daily  . cholestyramine  4 g Oral BID  . diphenoxylate-atropine  1 tablet Oral QID  . dorzolamide-timolol  1 drop Left Eye BID  . feeding supplement  1 Container Oral TID BM  . finasteride  5 mg Oral QHS  . heparin  5,000 Units Subcutaneous Q8H  . latanoprost  1 drop Left Eye QHS  . loperamide  2 mg Oral BID  . octreotide  150 mcg Subcutaneous Q12H  . sodium chloride flush  3 mL Intravenous Q12H  . tamsulosin  0.4 mg Oral QPC supper   Continuous Infusions:    LOS: 2 days   Time spent: 51min  Domenic Polite, MD [facility] 08/16/2020, 12:52 PM

## 2020-08-17 ENCOUNTER — Telehealth: Payer: Self-pay | Admitting: *Deleted

## 2020-08-17 DIAGNOSIS — G9341 Metabolic encephalopathy: Secondary | ICD-10-CM

## 2020-08-17 LAB — URINE CULTURE: Culture: >=100000 — AB

## 2020-08-17 LAB — CBC
HCT: 25.3 % — ABNORMAL LOW (ref 39.0–52.0)
Hemoglobin: 8.5 g/dL — ABNORMAL LOW (ref 13.0–17.0)
MCH: 25.3 pg — ABNORMAL LOW (ref 26.0–34.0)
MCHC: 33.6 g/dL (ref 30.0–36.0)
MCV: 75.3 fL — ABNORMAL LOW (ref 80.0–100.0)
Platelets: 101 10*3/uL — ABNORMAL LOW (ref 150–400)
RBC: 3.36 MIL/uL — ABNORMAL LOW (ref 4.22–5.81)
RDW: 18.9 % — ABNORMAL HIGH (ref 11.5–15.5)
WBC: 6.9 10*3/uL (ref 4.0–10.5)
nRBC: 0 % (ref 0.0–0.2)

## 2020-08-17 LAB — BASIC METABOLIC PANEL
Anion gap: 10 (ref 5–15)
BUN: 23 mg/dL (ref 8–23)
CO2: 20 mmol/L — ABNORMAL LOW (ref 22–32)
Calcium: 7.6 mg/dL — ABNORMAL LOW (ref 8.9–10.3)
Chloride: 107 mmol/L (ref 98–111)
Creatinine, Ser: 1.41 mg/dL — ABNORMAL HIGH (ref 0.61–1.24)
GFR, Estimated: 51 mL/min — ABNORMAL LOW (ref >60)
Glucose, Bld: 119 mg/dL — ABNORMAL HIGH (ref 70–99)
Potassium: 3.7 mmol/L (ref 3.5–5.1)
Sodium: 137 mmol/L (ref 135–145)

## 2020-08-17 MED ORDER — SODIUM CHLORIDE 0.9 % IV SOLN
2.0000 g | Freq: Two times a day (BID) | INTRAVENOUS | Status: DC
  Administered 2020-08-17 – 2020-08-20 (×6): 2 g via INTRAVENOUS
  Filled 2020-08-17 (×7): qty 2

## 2020-08-17 MED ORDER — BACLOFEN 10 MG PO TABS
5.0000 mg | ORAL_TABLET | Freq: Three times a day (TID) | ORAL | Status: DC
  Administered 2020-08-17 – 2020-08-18 (×5): 5 mg via ORAL
  Filled 2020-08-17 (×5): qty 1

## 2020-08-17 MED ORDER — ENSURE ENLIVE PO LIQD
237.0000 mL | Freq: Three times a day (TID) | ORAL | Status: DC
  Administered 2020-08-17 – 2020-08-22 (×11): 237 mL via ORAL

## 2020-08-17 NOTE — Plan of Care (Signed)
  Problem: Clinical Measurements: Goal: Respiratory complications will improve Outcome: Progressing   Problem: Activity: Goal: Risk for activity intolerance will decrease Outcome: Progressing   Problem: Nutrition: Goal: Adequate nutrition will be maintained Outcome: Not Progressing

## 2020-08-17 NOTE — Telephone Encounter (Signed)
Daughter left VM asking if liver transplant is an option for him and if the cancer has spread to other sites in his body?

## 2020-08-17 NOTE — Progress Notes (Signed)
Brief Nutrition Note  Pt's diet has been advanced to soft. Will d/c Boost Breeze and order Ensure Enlive po TID, each supplement provides 350 kcal and 20 grams of protein to provide pt with additional kcals/protein.  Larkin Ina, MS, RD, LDN RD pager number and weekend/on-call pager number located in [place].

## 2020-08-17 NOTE — TOC Initial Note (Signed)
Transition of Care [facility]) - Initial/Assessment Note    Patient Details  Name: Charles Schmidt MRN: [id number] Date of Birth: 12-21-1940  [facility] CM/SW Contact:    [address], Olivia Lopez de Gutierrez Phone Number: 08/17/2020, 2:04 PM  Clinical Narrative:                 Pt from [facility], completing short term rehab from previous hospital admission. CSW reached out to SNF, pt has been there for about 40 days and is in copay status, however pt has [employer] so this benefit takes care of copay. Pt is welcome to come back per admissions director. CSW will continue to follow.         Patient Goals and CMS Choice        Expected Discharge Plan and Services                                                Prior Living Arrangements/Services                       Activities of Daily Living Home Assistive Devices/Equipment: Gilford Rile (specify type) ADL Screening (condition at time of admission) Patient's cognitive ability adequate to safely complete daily activities?: Yes Is the patient deaf or have difficulty hearing?: No Does the patient have difficulty seeing, even when wearing glasses/contacts?: No Does the patient have difficulty concentrating, remembering, or making decisions?: No Patient able to express need for assistance with ADLs?: Yes Does the patient have difficulty dressing or bathing?: Yes Independently performs ADLs?: Yes (appropriate for developmental age) Does the patient have difficulty walking or climbing stairs?: Yes Weakness of Legs: Both Weakness of Arms/Hands: None  Permission Sought/Granted                  Emotional Assessment              Admission diagnosis:  Hypokalemia [E87.6] Lactic acid increased [E87.2] Syncope [R55] Metastatic malignant neuroendocrine tumor to liver ([place]) [C7B.8] Increased ammonia level [R79.89] Hypothermia, initial encounter [T68.XXXA] Hypotension, unspecified hypotension type  [I95.9] Altered mental status, unspecified altered mental status type [R41.82] Syncope, unspecified syncope type [R55] Intractable vomiting with nausea, unspecified vomiting type [R11.2] Patient Active Problem List   Diagnosis Date Noted  . Protein-calorie malnutrition, severe 08/16/2020  . Intractable vomiting   . Diarrhea   . Carcinoid syndrome ([place])   . Syncope 08/14/2020  . Acute metabolic encephalopathy 28/31/5176  . CKD (chronic kidney disease) stage 3, GFR 30-59 ml/min (HCC) 08/14/2020  . Staphylococcus epidermidis bacteremia   . Pressure injury of skin 06/12/2020  . Oliguria   . Palliative care by specialist   . Goals of care, counseling/discussion   . DNR (do not resuscitate)   . Acute renal failure ([place])   . Abdominal pain   . Elevated troponin   . Lives alone 06/06/2020  . Nonspecific elevation of levels of transaminase or lactic acid dehydrogenase (LDH) 06/06/2020  . Increased ammonia level 06/06/2020  . Direct hyperbilirubinemia 06/06/2020  . Bowel wall thickening 06/06/2020  . Vomiting, persistent, in adult 06/06/2020  . Anorexia 06/06/2020  . Colitis   . Elevated liver enzymes   . Nausea and vomiting in adult   . Dehydration   . Physical debility 06/05/2020  . Genetic testing 12/18/2017  . Metastatic malignant neuroendocrine tumor to  liver ([place]) 10/28/2017  . Essential hypertension (primary) 05/04/2017  . Hypokalemia 05/04/2017  . Benign prostatic hyperplasia 05/04/2017  . Glaucoma 05/04/2017  . Benign hypertensive heart disease without heart failure 05/04/2017  . Microcytic anemia 05/04/2017  . Prediabetes 05/04/2017  . Hypocalcemia 05/04/2017  . Psoriasis 05/04/2017   PCP:  Nolene Ebbs, MD Pharmacy:   [facility] [address] [place] [address] Phone: [phone] Fax: [facility], [place] [address] Phone: [phone] Fax: [phone]     Social Determinants of Health (SDOH) Interventions    Readmission Risk Interventions No flowsheet data found.

## 2020-08-17 NOTE — Progress Notes (Signed)
Pharmacy Antibiotic Note  Charles Schmidt is a 79 y.o. male admitted on 08/14/2020 with syncope and emesis.  Pharmacy has been consulted for  cefepime dosing for sepsis secondary to UTI.  Urine culture grew pseudomonas aeruginosa.  Scr 1.33 baseline. SCr 1.57>1.41  CrCl 42ml/min. W WBC within normal limits. Afebrile, TM 99.2  l.   Plan: Cefepime 2g IV q12h  Monitor renal function, cultures/sensitivies, and clinical progression  Height: 6\' 4"  (193 cm) Weight: 91.6 kg (201 lb 15.1 oz) IBW/kg (Calculated) : 86.8  Temp (24hrs), Avg:98.6 F (37 C), Min:98 F (36.7 C), Max:99.2 F (37.3 C)  Recent Labs  Lab 08/14/20 1020 08/14/20 1228 08/14/20 1520 08/15/20 0329 08/16/20 0158 08/17/20 0303  WBC 6.8  --   --  8.8 6.6 6.9  CREATININE 1.33*  --   --  1.44* 1.57* 1.41*  LATICACIDVEN  --  3.5* 2.4*  --   --   --     Estimated Creatinine Clearance: 52.2 mL/min (A) (by C-G formula based on SCr of 1.41 mg/dL (H)).    No Known Allergies  Antimicrobials this admission: Vancomycin 11/23 >>11/23 Cefepime 11/23 >>11/23;  Restart 11/26>>  Dose adjustments this admission: N/a  Microbiology results: 11/23 bcx:  11/23 ucx:   Thank you for allowing pharmacy to be a part of this patient's care.  Nicole Cella, RPh Clinical Pharmacist [phone] Please check AMION for all [place] phone numbers After 10:00 PM, call [place] [phone]  08/17/2020 12:02 PM

## 2020-08-17 NOTE — Progress Notes (Signed)
IP PROGRESS NOTE  Subjective:   Charles Schmidt is well-known to me with a history of metastatic carcinoid tumor.  He was last treated with lanreotide on 08/06/2020.  He was admitted on 08/14/2020 with hypotension, altered mental status, nausea, and diarrhea.  Charles Schmidt reports improvement in diarrhea today.  Pain.  He has hiccups. Objective: Vital signs in last 24 hours: Blood pressure 127/80, pulse 91, temperature 98 F (36.7 C), temperature source Oral, resp. rate 18, height _0  (1.93 m), weight 201 lb 15.1 oz (91.6 kg), SpO2 99 %.  Intake/Output from previous day: 11/25 0701 - 11/26 0700 In: 720 [P.O.:720] Out: 1550 [Urine:1550]  Physical Exam:  HEENT: Mild thrush at the posterior right buccal mucosa? Lungs: Decreased breath sounds at the right lower posterior chest, no respiratory distress Cardiac: Regular rate and rhythm Abdomen: The liver is palpable throughout the upper abdomen, mildly distended Extremities: Trace pitting edema at the legs and feet bilaterally Neurologic: Alert and oriented, follows commands   Lab Results: Recent Labs    08/16/20 0158 08/17/20 0303  WBC 6.6 6.9  HGB 8.9* 8.5*  HCT 26.3* 25.3*  PLT 110* 101*    BMET Recent Labs    08/16/20 0158 08/17/20 0303  NA 142 137  K 3.9 3.7  CL 110 107  CO2 21* 20*  GLUCOSE 119* 119*  BUN 23 23  CREATININE 1.57* 1.41*  CALCIUM 7.8* 7.6*     Medications: I have reviewed the patient's current medications.  Assessment/Plan: 1.Metastatic carcinoid tumor, biopsy of a liver lesion 10/23/2017 consistent with a well-differentiated neuroendocrine neoplasm, WHO grade 2,[id number]%  CT abdomen/pelvis 10/13/2017-extensive liver metastases, cirrhosis, soft tissue mass in the small bowel mesentery versus a primary small bowel tumor, tiny lucent lesions in the pelvic bones  Elevated chromogranin A and 24-hour urine 5-HIAA  CT chest 11/24/2017-subpleural nodule in the left lower lobe with associated  radiotracer activity on comparison DOTATATEPET scan. No additional evidence of thoracic metastasis.  DOTATATE PET scan3/01/2018-intense radiotracer accumulation with innumerable confluent hepatic metastasis; intense radiotracer activity within central mesenteric mass; 2 foci of uptake associated with the small bowel; intense radiotracer activity associated periaortic and paraspinal lymph nodes; more distant solitary small metastasis within the pleural space left lower lobe.  Monthly Sandostatin initiated 11/26/2017, dose increased to 30 mg 01/21/2018  Cycle 1 Lutathera 03/03/2018, monthly Sandostatin continued  Cycle 2 Lutathera 04/27/2018, monthly Sandostatin continued  Cycle 3 Lutathera 06/23/2018  Cycle 4 Lutathera 08/18/2018  Restaging dotatate PET scan 09/29/2018-multiple lesions again demonstrated within the liver which accumulate the radiotracer. The number of lesions which accumulate the tracer has decreased visually compared to the prior exam. Some lesions are no longer evident along the right hepatic margin. Remaining measurable lesions have radiotracer activity similar to prior. No new lesions present. Central mesenteric mass unchanged and remains avid for radiotracer. Adjacent lesion within the small bowel SUV max equal 10.5 compared to SUV max equal 15.8. Smaller lesion previously identified in the upper pelvis small bowel appears decreased in activity. No new metastatic lesions. Lesion position between the right psoas muscle and spine SUV max equal to 21.8 compared with SUV max equal 26.2.  Dotatate PET 10/19/2019-no evidence of disease progression, stable multifocal hepatic metastases, unchanged from post therapy scan 03/30/2019 and improved from pretherapy scan5/01/2018. Stable mesenteric mass, improved retroperitoneal lymph node adjacent to the celiac trunk, solitary small bowel lesion unchanged  Monthly Sandostatin continued  01/26/2020 chromogranin A level stable elevation  CT  abdomen/pelvis 06/05/2020-slight enlargement of central mesenteric  mass, extensive hepatic metastases-unchanged, bowel wall thickening in central small bowel loops and right colon  Lanreotide 07/09/2020  2.Diarrhea-secondary to carcinoid syndrome 3.History of anorexia/weight loss 4.Prostatic hypertrophy;status post laser vaporization 01/05/2018 5. Mild pancytopenia following Lutathera treatment 6.Thoracic dermatomal zoster rash 04/02/2019 treated with Valtrex 7. Admission 06/05/2020 after a fall with failure to thrive, increased diarrhea, nausea, and abdominal pain 8. Markedly elevated AST and CPK on admission September 2021 -likely rhabdomyolysis 9. Renal insufficiency 10. Staph epidermidis bacteremia and E. coli UTI 06/22/2020 -completed course of IV antibiotics 11.  Admission with hypotension, nausea, and diarrhea 08/14/2020 12.  Anemia-likely secondary to chronic disease and renal insufficiency, chronic Red cell microcytosis likely related to a thalassemia variant or chronic disease 13.  Urinary tract infection-culture from 08/14/2020 + for Pseudomonas aeruginosa  Charles Schmidt has advanced stage metastatic carcinoid tumor.  He has persistent diarrhea despite lanreotide, Imodium, and Lomotil.  I reviewed the CT images from hospital admission.  There is extensive tumor involving the liver and a mesenteric mass, essentially unchanged.  I suspect the acute clinical presentation is related to the Pseudomonas urinary tract infection.  Charles Schmidt has a poor prognosis given the advanced stage carcinoid disease and multiple comorbid conditions.  However patients with advanced carcinoid disease can live for many years if the carcinoid symptoms are controlled.  He would be a candidate for repeat treatment with Lutathera if he is eating/drinking and ambulatory.  He stated that he does not wish to consider hospice care at present.  Recommendations: 1.  Continue octreotide, Imodium, and  Lomotil 2.  Begin antibiotics for the Pseudomonas urinary tract infection 3.  Increase diet and out of bed as tolerated 4.  Please call Oncology as needed over the weekend, I will check on him 08/20/2020      LOS: 3 days   Betsy Coder, MD   08/17/2020, 6:46 AM

## 2020-08-17 NOTE — Progress Notes (Signed)
PROGRESS NOTE    Jasper Hanf  IRJ:[id number] DOB: 07/18/1941 DOA: 08/14/2020 PCP: Nolene Ebbs, MD  Brief Narrative: 79/M with widely metastatic carcinoid tumor, BPH, hypertension had a long hospital stay recently notable for bacteremia, diarrhea, failure to thrive, hospice was considered at the time however given clinical improvement he was discharged to rehab. -Sent to the ED from rehab with syncope, vomiting and hypotension.  When EMS arrived systolic blood pressure was in the 50s, he was given IV fluids. -On discussion patient requested hospice and wanted the pneumonia more more arousable of getting to the [place] to let him pass away. -Vital signs was subsequently stable, labs noted hypokalemia, mild AKI, mildly elevated lactic acidosis, CT abdomen pelvis noted increase ascites, small bowel thickening and perinephric stranding.  Assessment & Plan:   Hypotension Hypovolemia -Likely secondary to ongoing GI losses, chronic diarrhea and intermittent vomiting -Hydrated with saline, blood pressure improved, lactic acidosis has improved as well -stable, po intake remains poor, fluids discontinued on account of third spacing  Nausea/Vomiting/Diarrhea -Sepsis ruled out, without fever or leukocytosis tachypnea or tachycardia -CT abdomen pelvis noted small bowel thickening and could not rule out mesenteric ischemia, this is somewhat unchanged from prior CT back in September, seen by general surgery then as well and recommended palliative care -Supportive care-restart Questran, Lomotil, Imodium and octreotide -diarrhea improving -overall prognosis is very poor  Widely metastatic carcinoid tumor with central mesenteric mass, extensive liver metastasis, worsening malignant ascites -CT with disease progression, his albumin is 1.8 with third spacing -In my opinion his prognosis is very poor, called and updated patients daughter regarding this and that hospice is most appropriate -Palliative care  following -Restarted Lomotil, Imodium, Questran and octreotide -Appreciate Dr. Gearldine Shown input, found to have poor prognosis given advanced age carcinoid and multiple comorbidities however could potentially live longer if symptoms are controlled  Pseudomonas UTI versus colonization Urinary retention/BPH with chronic Foley -Foley catheter placed during previous hospitalization in October  -oncology concerned Pseudomonas in urine culture is a true infection, recommends antibiotics will treat with cefepime for 3 days, change Foley catheter  Chronic kidney disease stage IIIa -Baseline creatinine around 1.2,  -slight worsening, monitor  Severe protein calorie malnutrition 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 Benay Spice   DVT prophylaxis: Subcu heparin Code Status: DNR Family Communication: No family at bedside, updated patient's daughter 11/24 Disposition Plan:  Status is: Inpatient  Remains inpatient appropriate because:Inpatient level of care appropriate due to severity of illness   Dispo: The patient is from: SNF              Anticipated d/c is to: to be determined              Anticipated d/c date is: TBD              Patient currently is not medically stable to d/c.  Consultants: Palliative medicine, oncology   Procedures:   Antimicrobials:    Subjective: -Continues to have poor oral intake but it is marginally better from yesterday, denies any diarrhea yesterday  Objective: Vitals:   08/16/20 1753 08/16/20 2150 08/17/20 0500 08/17/20 1002  BP: 125/83 124/87 127/80 (!) 121/94  Pulse: 91 89 91 91  Resp:  18 18 18  18   Temp: 99.2 F (37.3 C) 98.8 F (37.1 C) 98 F (36.7 C) 98.5 F (36.9 C)  TempSrc: Oral Oral Oral   SpO2: 96% 99% 99% 99%  Weight:      Height:        Intake/Output Summary (Last 24 hours) at 08/17/2020 1350 Last data filed at 08/17/2020 1000 Gross per 24 hour  Intake 360 ml  Output 1375 ml  Net -1015 ml   Filed Weights   08/15/20 0132 08/16/20 0257  Weight: 91.6 kg 91.6 kg    Examination:  General exam: Elderly chronically ill male, laying in bed, awake alert oriented to self and partly to place, cognitive deficits noted CVS: S1-S2, regular rate rhythm Lungs: Decreased breath sounds at the bases Abdomen: Soft, nontender, mildly distended, bowel sounds present Extremities: 1+ edema  Psychiatry: Poor insight    Data Reviewed:   CBC: Recent Labs  Lab 08/14/20 1020 08/14/20 1149 08/15/20 0329 08/16/20 0158 08/17/20 0303  WBC 6.8  --  8.8 6.6 6.9  NEUTROABS 5.4  --   --   --   --   HGB 9.6* 10.9* 10.3* 8.9* 8.5*  HCT 30.1* 32.0* 30.1* 26.3* 25.3*  MCV 80.7  --  74.3* 74.5* 75.3*  PLT 145*  --  111* 110* 818*   Basic Metabolic Panel: Recent Labs  Lab 08/14/20 1020 08/14/20 1149 08/15/20 0329 08/16/20 0158 08/17/20 0303  NA 140 143 142 142 137  K 3.3* 3.2* 4.4 3.9 3.7  CL 108  --  110 110 107  CO2 19*  --  17* 21* 20*  GLUCOSE 143*  --  98 119* 119*  BUN 16  --  18 23 23   CREATININE 1.33*  --  1.44* 1.57* 1.41*  CALCIUM 8.1*  --  8.1* 7.8* 7.6*   GFR: Estimated Creatinine Clearance: 52.2 mL/min (A) (by C-G formula based on SCr of 1.41 mg/dL (H)). Liver Function Tests: Recent Labs  Lab 08/14/20 1020 08/15/20 0329  AST 48* 616*  ALT 18 30  ALKPHOS 185* 166*  BILITOT 1.4* 1.3*  PROT 6.6 6.1*  ALBUMIN 2.2* 1.8*   Recent Labs  Lab 08/14/20 1020  LIPASE 19   Recent Labs  Lab 08/14/20 1228  AMMONIA 52*   Coagulation Profile: Recent Labs  Lab 08/14/20 1020  INR 1.4*   Cardiac Enzymes: No results for input(s): CKTOTAL, CKMB,  CKMBINDEX, TROPONINI in the last 168 hours. BNP (last 3 results) No results for input(s): PROBNP in the last 8760 hours. HbA1C: No results for input(s): HGBA1C in the last 72 hours. CBG: Recent Labs  Lab 08/14/20 2110  GLUCAP 98   Lipid Profile: No results for input(s): CHOL, HDL, LDLCALC, TRIG, CHOLHDL, LDLDIRECT in the last 72 hours. Thyroid Function Tests: No results for input(s): TSH, T4TOTAL, FREET4, T3FREE, THYROIDAB in the last 72 hours. Anemia Panel: No results for input(s): VITAMINB12, FOLATE, FERRITIN, TIBC, IRON, RETICCTPCT in the last 72 hours. Urine analysis:    Component Value Date/Time   COLORURINE YELLOW 08/14/2020 1135   APPEARANCEUR CLOUDY (A) 08/14/2020 1135   LABSPEC 1.005 08/14/2020 1135   PHURINE 6.0 08/14/2020 1135   GLUCOSEU NEGATIVE 08/14/2020 1135   HGBUR LARGE (A) 08/14/2020 1135   BILIRUBINUR NEGATIVE 08/14/2020 1135   KETONESUR NEGATIVE 08/14/2020 1135   PROTEINUR NEGATIVE 08/14/2020 1135   UROBILINOGEN 0.2 10/22/2014 2058   NITRITE NEGATIVE 08/14/2020 1135   LEUKOCYTESUR LARGE (A) 08/14/2020 1135   Sepsis Labs: @LABRCNTIP (procalcitonin:4,lacticidven:4)  ) Recent  Results (from the past 240 hour(s))  Urine culture     Status: Abnormal   Collection Time: 08/14/20 10:30 AM   Specimen: In/Out Cath Urine  Result Value Ref Range Status   Specimen Description IN/OUT CATH URINE  Final   Special Requests   Final    NONE Performed at [facility], [address], [place], [place] [address]    Culture >=100,000 COLONIES/mL PSEUDOMONAS AERUGINOSA (A)  Final   Report Status 08/17/2020 FINAL  Final   Organism ID, Bacteria PSEUDOMONAS AERUGINOSA (A)  Final      Susceptibility   Pseudomonas aeruginosa - MIC*    CEFTAZIDIME <=1 SENSITIVE Sensitive     CIPROFLOXACIN <=0.25 SENSITIVE Sensitive     GENTAMICIN <=1 SENSITIVE Sensitive     IMIPENEM 2 SENSITIVE Sensitive     PIP/TAZO <=4 SENSITIVE Sensitive     CEFEPIME 2 SENSITIVE Sensitive     *  >=100,000 COLONIES/mL PSEUDOMONAS AERUGINOSA  Blood culture (routine x 2)     Status: None (Preliminary result)   Collection Time: 08/14/20 11:03 AM   Specimen: BLOOD  Result Value Ref Range Status   Specimen Description BLOOD SITE NOT SPECIFIED  Final   Special Requests   Final    BOTTLES DRAWN AEROBIC AND ANAEROBIC Blood Culture results may not be optimal due to an inadequate volume of blood received in culture bottles   Culture   Final    NO GROWTH 2 DAYS Performed at [facility], [place] [address], [place], [place] [address]    Report Status PENDING  Incomplete  Respiratory Panel by RT PCR (Flu A&B, Covid) - Nasopharyngeal Swab     Status: None   Collection Time: 08/14/20 11:58 AM   Specimen: Nasopharyngeal Swab; Nasopharyngeal(NP) swabs in vial transport medium  Result Value Ref Range Status   SARS Coronavirus 2 by RT PCR NEGATIVE NEGATIVE Final    Comment: (NOTE) SARS-CoV-2 target nucleic acids are NOT DETECTED.  The SARS-CoV-2 RNA is generally detectable in upper respiratoy specimens during the acute phase of infection. The lowest concentration of SARS-CoV-2 viral copies this assay can detect is 131 copies/mL. A negative result does not preclude SARS-Cov-2 infection and should not be used as the sole basis for treatment or other patient management decisions. A negative result may occur with  improper specimen collection/handling, submission of specimen other than nasopharyngeal swab, presence of viral mutation(s) within the areas targeted by this assay, and inadequate number of viral copies (<131 copies/mL). A negative result must be combined with clinical observations, patient history, and epidemiological information. The expected result is Negative.  Fact Sheet for Patients:  PinkCheek.be  Fact Sheet for Healthcare Providers:  GravelBags.it  This test is no t yet approved or cleared by the [place] FDA  and  has been authorized for detection and/or diagnosis of SARS-CoV-2 by FDA under an Emergency Use Authorization (EUA). This EUA will remain  in effect (meaning this test can be used) for the duration of the COVID-19 declaration under Section 564(b)(1) of the Act, 21 U.S.C. section 360bbb-3(b)(1), unless the authorization is terminated or revoked sooner.     Influenza A by PCR NEGATIVE NEGATIVE Final   Influenza B by PCR NEGATIVE NEGATIVE Final    Comment: (NOTE) The Xpert Xpress SARS-CoV-2/FLU/RSV assay is intended as an aid in  the diagnosis of influenza from Nasopharyngeal swab specimens and  should not be used as a sole basis for treatment. Nasal washings and  aspirates are unacceptable  for Xpert Xpress SARS-CoV-2/FLU/RSV  testing.  Fact Sheet for Patients: PinkCheek.be  Fact Sheet for Healthcare Providers: GravelBags.it  This test is not yet approved or cleared by the [place] FDA and  has been authorized for detection and/or diagnosis of SARS-CoV-2 by  FDA under an Emergency Use Authorization (EUA). This EUA will remain  in effect (meaning this test can be used) for the duration of the  Covid-19 declaration under Section 564(b)(1) of the Act, 21  U.S.C. section 360bbb-3(b)(1), unless the authorization is  terminated or revoked. Performed at [facility], [place] [address], [place], [place] [address]   Blood culture (routine x 2)     Status: None (Preliminary result)   Collection Time: 08/14/20  9:34 PM   Specimen: BLOOD RIGHT HAND  Result Value Ref Range Status   Specimen Description BLOOD RIGHT HAND  Final   Special Requests   Final    BOTTLES DRAWN AEROBIC AND ANAEROBIC Blood Culture adequate volume   Culture   Final    NO GROWTH 2 DAYS Performed at [facility], [place] [address], [place], [place] [address]    Report Status PENDING  Incomplete         Radiology Studies: No results  found. Scheduled Meds: . baclofen  5 mg Oral TID  . Chlorhexidine Gluconate Cloth  6 each Topical Daily  . cholestyramine  4 g Oral BID  . diphenoxylate-atropine  1 tablet Oral QID  . dorzolamide-timolol  1 drop Left Eye BID  . feeding supplement  1 Container Oral TID BM  . finasteride  5 mg Oral QHS  . heparin  5,000 Units Subcutaneous Q8H  . latanoprost  1 drop Left Eye QHS  . loperamide  2 mg Oral BID  . octreotide  150 mcg Subcutaneous Q12H  . sodium chloride flush  3 mL Intravenous Q12H  . tamsulosin  0.4 mg Oral QPC supper   Continuous Infusions: . ceFEPime (MAXIPIME) IV       LOS: 3 days   Time spent: 59min  Domenic Polite, MD [facility] 08/17/2020, 1:50 PM

## 2020-08-17 NOTE — Progress Notes (Signed)
Daily Progress Note   Patient Name: Charles Schmidt       Date: 08/17/2020 DOB: [birth year]  Age: 79 y.o. MRN#: [id number] Attending Physician: Domenic Polite, MD Primary Care Physician: Nolene Ebbs, MD Admit Date: 08/14/2020  Reason for Consultation/Follow-up: Establishing goals of care  Subjective: Chart review performed. Received report from primary RN - no acute concerns. RN reports patient has not had any diarrhea episodes today and is eating/drinking well.  Went to visit patient at bedside - [name] was present. Patient was lying in bed awake, alert, oriented, and able to participate in conversation. Patient states the current medication regimen for his diarrhea is working and he is Patent attorney. He seems anxious, however, about how to get up to the bathroom when he needs to have a bowel movement. Discussed utilizing BSC for safety. Reviewed information from Dr. Carin Hock note with patient and daughter. Reviewed options/goals of a comfort path vs continuing aggressive interventions in detail. The patient stated that he doesn't feel he can make that decision right now - validation provided that it is a hard decision and it's understandable if he wants to think about it. The patient did state that his decision would greatly depend on his diarrhea symptom management. He did tell me that he was not really interested in doing rehab again, but if his diarrhea was better managed he would consider it. Discussed that rehab might not provide benefit in context of his malnutrition - the patient understands his oral intake/nutrition must improve and he is motivated to eat/drink his nutritional supplement. The patient was motivated to work with PT in house for evaluation - he is very focused on how  he can get to the bathroom if needed as he does not want to have an incontinence episode in the bed.    Patient asked questions around Boost supplement - answered all questions.   Patient is open to receiving antibiotics for UTI.   Patient had hiccups for the duration of visit - patient's daughter states they are constant and his only relief are when he's sleeping; they get worse when the patient is in the hospital. Patient is agreeable to try medication for management.   All questions and concerns addressed. Encouraged to call with questions and/or concerns. PMT card provided.   Length of Stay: 3  Current Medications: Scheduled  Meds:  . Chlorhexidine Gluconate Cloth  6 each Topical Daily  . cholestyramine  4 g Oral BID  . diphenoxylate-atropine  1 tablet Oral QID  . dorzolamide-timolol  1 drop Left Eye BID  . feeding supplement  1 Container Oral TID BM  . finasteride  5 mg Oral QHS  . heparin  5,000 Units Subcutaneous Q8H  . latanoprost  1 drop Left Eye QHS  . loperamide  2 mg Oral BID  . octreotide  150 mcg Subcutaneous Q12H  . sodium chloride flush  3 mL Intravenous Q12H  . tamsulosin  0.4 mg Oral QPC supper    Continuous Infusions: . ceFEPime (MAXIPIME) IV      PRN Meds: fentaNYL (SUBLIMAZE) injection, metoprolol tartrate, ondansetron **OR** ondansetron (ZOFRAN) IV, oxyCODONE, polyethylene glycol, traMADol  Physical Exam Vitals and nursing note reviewed.  Constitutional:      General: He is not in acute distress. Pulmonary:     Effort: No respiratory distress.  Skin:    General: Skin is warm and dry.  Neurological:     Mental Status: He is alert and oriented to person, place, and time.     Motor: Weakness present.  Psychiatric:        Attention and Perception: Attention normal.        Behavior: Behavior is cooperative.        Cognition and Memory: Cognition and memory normal.             Vital Signs: BP (!) 121/94 (BP Location: Right Arm)   Pulse 91   Temp  98.5 F (36.9 C)   Resp 18   Ht 6\' 4"  (1.93 m)   Wt 91.6 kg   SpO2 99%   BMI 24.58 kg/m  SpO2: SpO2: 99 % O2 Device: O2 Device: Room Air O2 Flow Rate:    Intake/output summary:   Intake/Output Summary (Last 24 hours) at 08/17/2020 1312 Last data filed at 08/17/2020 1000 Gross per 24 hour  Intake 600 ml  Output 1600 ml  Net -1000 ml   LBM: Last BM Date: 08/15/20 Baseline Weight: Weight: 91.6 kg Most recent weight: Weight: 91.6 kg       Palliative Assessment/Data: PPS 50%      Patient Active Problem List   Diagnosis Date Noted  . Protein-calorie malnutrition, severe 08/16/2020  . Intractable vomiting   . Diarrhea   . Carcinoid syndrome ([place])   . Syncope 08/14/2020  . Acute metabolic encephalopathy 83/41/9622  . CKD (chronic kidney disease) stage 3, GFR 30-59 ml/min (HCC) 08/14/2020  . Staphylococcus epidermidis bacteremia   . Pressure injury of skin 06/12/2020  . Oliguria   . Palliative care by specialist   . Goals of care, counseling/discussion   . DNR (do not resuscitate)   . Acute renal failure ([place])   . Abdominal pain   . Elevated troponin   . Lives alone 06/06/2020  . Nonspecific elevation of levels of transaminase or lactic acid dehydrogenase (LDH) 06/06/2020  . Increased ammonia level 06/06/2020  . Direct hyperbilirubinemia 06/06/2020  . Bowel wall thickening 06/06/2020  . Vomiting, persistent, in adult 06/06/2020  . Anorexia 06/06/2020  . Colitis   . Elevated liver enzymes   . Nausea and vomiting in adult   . Dehydration   . Physical debility 06/05/2020  . Genetic testing 12/18/2017  . Metastatic malignant neuroendocrine tumor to liver () 10/28/2017  . Essential hypertension (primary) 05/04/2017  . Hypokalemia 05/04/2017  . Benign prostatic hyperplasia 05/04/2017  . Glaucoma  05/04/2017  . Benign hypertensive heart disease without heart failure 05/04/2017  . Microcytic anemia 05/04/2017  . Prediabetes 05/04/2017  . Hypocalcemia 05/04/2017   . Psoriasis 05/04/2017    Palliative Care Assessment & Plan   Patient Profile: 79 y.o.malewith past medical history of metastatic carcinoid tumor, BPH s/p laser vaporization, and HTN presenting to the emergency departmenton11/23/2021with syncopal episode and vomiting.Upon EMS arrival, his systolic BP was in the 58'I, he was given IV fluid and zofran.  In the ED, patient states to the admitting physician "give me morphine, let me die". Labs reveal hypokalemia, creatinine 1.33, lactic acid 3.5 down to 2.4. Chest x-ray showed bibasilar atelectasis. CT head without acute abnormality. CT abdomen shows increased ascites, small bowel thickening and perienteric stranding seen on previous study in the setting of central mesenteric mass that encases mesenteric vasculature.  Patient is followed by outpatient oncology - Dr. Benay Spice.   Palliative was consulted to assist with goals of care discussions in the setting of advanced cancer and declining health state. Patient is known to PMT from his recent hospitalization 06/05/20 to 07/05/20.  Assessment: Metastatic carcinoid tumor with central mesenteric mass, extensive liver metastasis, worsening malignant ascites Hypotension/hypovolemia Nausea/vomiting/diarrhea CKD stage 3 Severe protein calorie malnutrition Pseudomonas UTI  Recommendations/Plan:  Continue current medical treatment   Continue DNR/DNI as previously documented  Patient is considering options of comfort care vs aggressive interventions - his decisions greatly depend on how well his diarrhea can be managed; he wants watchful waiting over weekend  Patient is considering SNF on discharge - PT consulted  Baclofen 5mg  PO TID for hiccups  PMT will continue to follow holistically  Goals of Care and Additional Recommendations:  Limitations on Scope of Treatment: Full Scope Treatment  Code Status:    Code Status Orders  (From admission, onward)         Start      Ordered   08/14/20 2106  Do not attempt resuscitation (DNR)  Continuous       Question Answer Comment  In the event of cardiac or respiratory ARREST Do not call a "code blue"   In the event of cardiac or respiratory ARREST Do not perform Intubation, CPR, defibrillation or ACLS   In the event of cardiac or respiratory ARREST Use medication by any route, position, wound care, and other measures to relive pain and suffering. May use oxygen, suction and manual treatment of airway obstruction as needed for comfort.      08/14/20 2105        Code Status History    Date Active Date Inactive Code Status Order ID Comments User Context   06/08/2020 0907 07/05/2020 1955 DNR [id number]  Rosezella Rumpf, NP Inpatient   06/05/2020 1738 06/08/2020 0906 Full Code [id number]  Danna Hefty, DO ED   Advance Care Planning Activity       Prognosis:   Poor prognosis considering patient's metastatic disease with multiple comorbidities  Discharge Planning:  To Be Determined  Care plan was discussed with patient, [name], primary RN, Dr. Leandro Reasoner, [facility]  Thank you for allowing the Palliative Medicine Team to assist in the care of this patient.   Total Time 30 minutes Prolonged Time Billed  no       Greater than 50%  of this time was spent counseling and coordinating care related to the above assessment and plan.  Lin Landsman, NP  Please contact Palliative Medicine Team phone at [phone] for questions and concerns.

## 2020-08-18 DIAGNOSIS — C7B8 Other secondary neuroendocrine tumors: Secondary | ICD-10-CM

## 2020-08-18 DIAGNOSIS — Z7189 Other specified counseling: Secondary | ICD-10-CM

## 2020-08-18 DIAGNOSIS — E43 Unspecified severe protein-calorie malnutrition: Secondary | ICD-10-CM

## 2020-08-18 DIAGNOSIS — Z66 Do not resuscitate: Secondary | ICD-10-CM

## 2020-08-18 DIAGNOSIS — N183 Chronic kidney disease, stage 3 unspecified: Secondary | ICD-10-CM

## 2020-08-18 DIAGNOSIS — E340 Carcinoid syndrome: Secondary | ICD-10-CM

## 2020-08-18 DIAGNOSIS — Z789 Other specified health status: Secondary | ICD-10-CM

## 2020-08-18 DIAGNOSIS — Z515 Encounter for palliative care: Secondary | ICD-10-CM

## 2020-08-18 DIAGNOSIS — R197 Diarrhea, unspecified: Secondary | ICD-10-CM

## 2020-08-18 DIAGNOSIS — R066 Hiccough: Secondary | ICD-10-CM

## 2020-08-18 DIAGNOSIS — R55 Syncope and collapse: Secondary | ICD-10-CM

## 2020-08-18 NOTE — Evaluation (Signed)
Physical Therapy Evaluation Patient Details Name: Charles Schmidt MRN: [id number] DOB: May 21, 1941 Today's Date: 08/18/2020   History of Present Illness  The pt is a 79 yo male presenting from SNF due to syncopal episode with diarrhea in the shower. PMH includes: HTN, carcinoid tumor with multiple metastases to the liver.  Clinical Impression  Pt in bed upon arrival of PT, agreeable to evaluation at this time. Prior to admission the pt was working on gait with RW at rehab, but receiving assist for ADLs and mobilizing predominantly in [facility]. The pt now presents with limitations in functional mobility, activity tolerance, strength, stability, and power due to above dx, and will continue to benefit from skilled PT to address these deficits. The pt was able to initiate bed mobility and perform initial transfers with minA/modA and use of RW, but was unable to generate enough power or stability at this time to complete without assist. He is further limited by incontinence of bowel in standing, and declined further gait at this time. The pt will continue to benefit from skilled PT to meet his goal of ambulating to bathroom acutely and following return to SNF.      Follow Up Recommendations SNF;Supervision/Assistance - 24 hour    Equipment Recommendations  None recommended by PT    Recommendations for Other Services       Precautions / Restrictions Precautions Precautions: Fall Restrictions Weight Bearing Restrictions: No      Mobility  Bed Mobility Overal bed mobility: Needs Assistance Bed Mobility: Supine to Sit;Sit to Supine   Sidelying to sit: Min assist Supine to sit: Min assist Sit to supine: Min assist   General bed mobility comments: minA to complete with use of bed rail and increased time    Transfers Overall transfer level: Needs assistance Equipment used: Rolling walker (2 wheeled) Transfers: Sit to/from Stand Sit to Stand: Mod assist         General transfer  comment: modA from low surface (pt is tall), to power up, then pt able to steady with Rw no additional assist  Ambulation/Gait             General Gait Details: pt declined due to BM      Balance Overall balance assessment: Needs assistance Sitting-balance support: Single extremity supported Sitting balance-Leahy Scale: Fair Sitting balance - Comments: post lean with dynamic reaching for LE sitting EOb Postural control: Posterior lean Standing balance support: Bilateral upper extremity supported Standing balance-Leahy Scale: Poor                               Pertinent Vitals/Pain Pain Assessment: Faces Faces Pain Scale: Hurts a little bit Pain Location: general Pain Descriptors / Indicators: Discomfort;Grimacing Pain Intervention(s): Limited activity within patient's tolerance;Monitored during session;Repositioned    Home Living Family/patient expects to be discharged to:: Skilled nursing facility                 Additional Comments: pt and family happy to return to same facility    Prior Function Level of Independence: Needs assistance   Gait / Transfers Assistance Needed: working on gait with rehab (few steps with RW and minA). assist to transfer from [facility] to deviceusing WC for mobility around facility  ADL's / Homemaking Assistance Needed: assist for all ADLs and IADLs  Comments: family has seen significant improvements while pt at rehab     Hand Dominance   Dominant Hand: Right  Extremity/Trunk Assessment   Upper Extremity Assessment Upper Extremity Assessment: Generalized weakness    Lower Extremity Assessment Lower Extremity Assessment: Generalized weakness    Cervical / Trunk Assessment Cervical / Trunk Assessment: Normal  Communication   Communication: No difficulties  Cognition Arousal/Alertness: Awake/alert Behavior During Therapy: Flat affect Overall Cognitive Status: Within Functional Limits for tasks assessed                                  General Comments: flat affect, able to verbalize needs, followed all commands      General Comments General comments (skin integrity, edema, etc.): VSS on RA, daughter present and supportive    Exercises     Assessment/Plan    PT Assessment Patient needs continued PT services  PT Problem List Decreased strength;Decreased activity tolerance;Decreased balance;Decreased mobility;Decreased coordination       PT Treatment Interventions DME instruction;Gait training;Stair training;Functional mobility training;Therapeutic activities;Therapeutic exercise;Balance training;Patient/family education    PT Goals (Current goals can be found in the Care Plan section)  Acute Rehab PT Goals Patient Stated Goal: to go to bathroom PT Goal Formulation: With patient Time For Goal Achievement: 09/01/20 Potential to Achieve Goals: Good    Frequency Min 2X/week    AM-PAC PT "6 Clicks" Mobility  Outcome Measure Help needed turning from your back to your side while in a flat bed without using bedrails?: A Little Help needed moving from lying on your back to sitting on the side of a flat bed without using bedrails?: A Little Help needed moving to and from a bed to a chair (including a wheelchair)?: A Lot Help needed standing up from a chair using your arms (e.g., wheelchair or bedside chair)?: A Little Help needed to walk in hospital room?: A Lot Help needed climbing 3-5 steps with a railing? : Total 6 Click Score: 14    End of Session Equipment Utilized During Treatment: Gait belt Activity Tolerance: Patient tolerated treatment well;Patient limited by fatigue Patient left: in bed;with call bell/phone within reach;with family/visitor present Nurse Communication: Mobility status PT Visit Diagnosis: Other abnormalities of gait and mobility (R26.89);Muscle weakness (generalized) (M62.81)    Time: 7741-4239 PT Time Calculation (min) (ACUTE ONLY): 29  min   Charges:   PT Evaluation $PT Eval Low Complexity: 1 Low PT Treatments $Gait Training: 8-22 mins        Karma Ganja, PT, DPT   Acute Rehabilitation Department Pager #: [phone]  Otho Bellows 08/18/2020, 4:54 PM

## 2020-08-18 NOTE — Progress Notes (Signed)
PROGRESS NOTE    Charles Schmidt  XTK:[id number] DOB: 1941-06-18 DOA: 08/14/2020 PCP: Nolene Ebbs, MD  Brief Narrative: 79/M with widely metastatic carcinoid tumor, BPH, hypertension had a long hospital stay recently notable for bacteremia, diarrhea, failure to thrive, hospice was considered at the time however given clinical improvement he was discharged to rehab. -Sent to the ED from rehab with syncope, vomiting and hypotension.  When EMS arrived systolic blood pressure was in the 50s, he was given IV fluids. -On discussion patient requested hospice and wanted the pneumonia more more arousable of getting to the [place] to let him pass away. -Vital signs was subsequently stable, labs noted hypokalemia, mild AKI, mildly elevated lactic acidosis, CT abdomen pelvis noted increase ascites, small bowel thickening and perinephric stranding.  Assessment & Plan:   Hypotension Hypovolemia Chronic diarrhea -Sepsis ruled out, no fever, leukocytosis tachycardia or tachypnea -secondary to ongoing GI losses, chronic diarrhea and intermittent vomiting -Diarrhea has improved, continue Questran Lomotil Imodium and octreotide -Hydrated with saline, blood pressure improved, lactic acidosis has improved as well -Blood pressure stable now, fluids discontinued  Widely metastatic carcinoid tumor with central mesenteric mass, extensive liver metastasis, worsening malignant ascites -CT with disease progression, his albumin is 1.8 with third spacing -In my opinion his prognosis is very poor, called and updated patients daughter regarding this and that hospice is most appropriate -Palliative care following -Restarted Lomotil, Imodium, Questran and octreotide -Appreciate Dr. 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 Benay Spice -Patient declines hospice at this time, most appropriate for home with hospice services however if continues to remain stable will plan SNF with palliative care   DVT prophylaxis: Subcu heparin Code Status: DNR Family Communication: No family at bedside, updated patient's daughter few days ago Disposition Plan:  Status is: Inpatient  Remains inpatient appropriate because:Inpatient level of care appropriate due to severity of illness   Dispo: The patient is from: SNF              Anticipated d/c is to: to be determined              Anticipated d/c date is: Likely Monday              Patient currently is not medically stable to d/c.  Consultants: Palliative medicine, oncology   Procedures:   Antimicrobials:    Subjective: -Feels okay overall, without much diarrhea, one soft stool today, oral intake is improving  Objective: Vitals:   08/17/20 2015 08/17/20 2018 08/18/20 0537 08/18/20 1059  BP: 133/86  132/90 138/89  Pulse: 86  83 85  Resp: 18  14 16   Temp: 98.5 F (36.9 C)  98.2 F (36.8 C) 97.9 F (36.6 C)  TempSrc: Oral  Oral Oral  SpO2: 97%  97% 99%  Weight:  93.4 kg 94.8 kg   Height:        Intake/Output Summary (Last  24 hours) at 08/18/2020 1343 Last data filed at 08/18/2020 1000 Gross per 24 hour  Intake 686.06 ml  Output 1000 ml  Net -313.94 ml   Filed Weights   08/16/20 0257 08/17/20 2018 08/18/20 0537  Weight: 91.6 kg 93.4 kg 94.8 kg    Examination:  General exam: Elderly chronically ill male laying in bed, awake alert oriented to his self, partly to place, cognitive deficits noted CVS: S1-S2, regular rate rhythm Lungs: Decreased breath sounds the bases Abdomen: Soft, nontender, mildly distended, bowel sounds present Extremities: 1+ edema  Psychiatry: Poor insight    Data Reviewed:   CBC: Recent Labs  Lab 08/14/20 1020 08/14/20 1149 08/15/20 0329 08/16/20 0158 08/17/20 0303  WBC 6.8  --  8.8 6.6 6.9  NEUTROABS 5.4  --   --   --   --   HGB 9.6* 10.9* 10.3* 8.9* 8.5*  HCT 30.1* 32.0* 30.1* 26.3* 25.3*  MCV 80.7  --  74.3* 74.5* 75.3*  PLT 145*  --  111* 110* 417*   Basic Metabolic Panel: Recent Labs  Lab 08/14/20 1020 08/14/20 1149 08/15/20 0329 08/16/20 0158 08/17/20 0303  NA 140 143 142 142 137  K 3.3* 3.2* 4.4 3.9 3.7  CL 108  --  110 110 107  CO2 19*  --  17* 21* 20*  GLUCOSE 143*  --  98 119* 119*  BUN 16  --  18 23 23   CREATININE 1.33*  --  1.44* 1.57* 1.41*  CALCIUM 8.1*  --  8.1* 7.8* 7.6*   GFR: Estimated Creatinine Clearance: 52.2 mL/min (A) (by C-G formula based on SCr of 1.41 mg/dL (H)). Liver Function Tests: Recent Labs  Lab 08/14/20 1020 08/15/20 0329  AST 48* 616*  ALT 18 30  ALKPHOS 185* 166*  BILITOT 1.4* 1.3*  PROT 6.6 6.1*  ALBUMIN 2.2* 1.8*   Recent Labs  Lab 08/14/20 1020  LIPASE 19   Recent Labs  Lab 08/14/20 1228  AMMONIA 52*   Coagulation Profile: Recent Labs  Lab 08/14/20 1020  INR 1.4*   Cardiac Enzymes: No results for input(s): CKTOTAL, CKMB, CKMBINDEX, TROPONINI in the last 168 hours. BNP (last 3 results) No results for input(s): PROBNP in the last 8760 hours. HbA1C: No results for input(s): HGBA1C in the  last 72 hours. CBG: Recent Labs  Lab 08/14/20 2110  GLUCAP 98   Lipid Profile: No results for input(s): CHOL, HDL, LDLCALC, TRIG, CHOLHDL, LDLDIRECT in the last 72 hours. Thyroid Function Tests: No results for input(s): TSH, T4TOTAL, FREET4, T3FREE, THYROIDAB in the last 72 hours. Anemia Panel: No results for input(s): VITAMINB12, FOLATE, FERRITIN, TIBC, IRON, RETICCTPCT in the last 72 hours. Urine analysis:    Component Value Date/Time   COLORURINE YELLOW 08/14/2020 1135   APPEARANCEUR CLOUDY (A) 08/14/2020 1135   LABSPEC 1.005 08/14/2020 1135   PHURINE 6.0 08/14/2020 1135   GLUCOSEU NEGATIVE 08/14/2020 1135   HGBUR LARGE (A) 08/14/2020 1135   BILIRUBINUR NEGATIVE 08/14/2020 1135   KETONESUR NEGATIVE 08/14/2020 1135   PROTEINUR NEGATIVE 08/14/2020 1135   UROBILINOGEN 0.2 10/22/2014 2058   NITRITE NEGATIVE 08/14/2020 1135   LEUKOCYTESUR LARGE (A) 08/14/2020 1135   Sepsis Labs: @LABRCNTIP (procalcitonin:4,lacticidven:4)  ) Recent Results (from the past 240 hour(s))  Urine culture     Status: Abnormal   Collection Time: 08/14/20 10:30 AM  Specimen: In/Out Cath Urine  Result Value Ref Range Status   Specimen Description IN/OUT CATH URINE  Final   Special Requests   Final    NONE Performed at [facility], [address], [place], [place] [address]    Culture >=100,000 COLONIES/mL PSEUDOMONAS AERUGINOSA (A)  Final   Report Status 08/17/2020 FINAL  Final   Organism ID, Bacteria PSEUDOMONAS AERUGINOSA (A)  Final      Susceptibility   Pseudomonas aeruginosa - MIC*    CEFTAZIDIME <=1 SENSITIVE Sensitive     CIPROFLOXACIN <=0.25 SENSITIVE Sensitive     GENTAMICIN <=1 SENSITIVE Sensitive     IMIPENEM 2 SENSITIVE Sensitive     PIP/TAZO <=4 SENSITIVE Sensitive     CEFEPIME 2 SENSITIVE Sensitive     * >=100,000 COLONIES/mL PSEUDOMONAS AERUGINOSA  Blood culture (routine x 2)     Status: None (Preliminary result)   Collection Time: 08/14/20 11:03 AM   Specimen: BLOOD   Result Value Ref Range Status   Specimen Description BLOOD SITE NOT SPECIFIED  Final   Special Requests   Final    BOTTLES DRAWN AEROBIC AND ANAEROBIC Blood Culture results may not be optimal due to an inadequate volume of blood received in culture bottles   Culture   Final    NO GROWTH 4 DAYS Performed at [facility], [place] [address] [place], [place] [address]    Report Status PENDING  Incomplete  Respiratory Panel by RT PCR (Flu A&B, Covid) - Nasopharyngeal Swab     Status: None   Collection Time: 08/14/20 11:58 AM   Specimen: Nasopharyngeal Swab; Nasopharyngeal(NP) swabs in vial transport medium  Result Value Ref Range Status   SARS Coronavirus 2 by RT PCR NEGATIVE NEGATIVE Final    Comment: (NOTE) SARS-CoV-2 target nucleic acids are NOT DETECTED.  The SARS-CoV-2 RNA is generally detectable in upper respiratoy specimens during the acute phase of infection. The lowest concentration of SARS-CoV-2 viral copies this assay can detect is 131 copies/mL. A negative result does not preclude SARS-Cov-2 infection and should not be used as the sole basis for treatment or other patient management decisions. A negative result may occur with  improper specimen collection/handling, submission of specimen other than nasopharyngeal swab, presence of viral mutation(s) within the areas targeted by this assay, and inadequate number of viral copies (<131 copies/mL). A negative result must be combined with clinical observations, patient history, and epidemiological information. The expected result is Negative.  Fact Sheet for Patients:  PinkCheek.be  Fact Sheet for Healthcare Providers:  GravelBags.it  This test is no t yet approved or cleared by the [place] FDA and  has been authorized for detection and/or diagnosis of SARS-CoV-2 by FDA under an Emergency Use Authorization (EUA). This EUA will remain  in effect (meaning this  test can be used) for the duration of the COVID-19 declaration under Section 564(b)(1) of the Act, 21 U.S.C. section 360bbb-3(b)(1), unless the authorization is terminated or revoked sooner.     Influenza A by PCR NEGATIVE NEGATIVE Final   Influenza B by PCR NEGATIVE NEGATIVE Final    Comment: (NOTE) The Xpert Xpress SARS-CoV-2/FLU/RSV assay is intended as an aid in  the diagnosis of influenza from Nasopharyngeal swab specimens and  should not be used as a sole basis for treatment. Nasal washings and  aspirates are unacceptable for Xpert Xpress SARS-CoV-2/FLU/RSV  testing.  Fact Sheet for Patients: PinkCheek.be  Fact Sheet for Healthcare Providers: GravelBags.it  This test is not  yet approved or cleared by the [place] and  has been authorized for detection and/or diagnosis of SARS-CoV-2 by  FDA under an Emergency Use Authorization (EUA). This EUA will remain  in effect (meaning this test can be used) for the duration of the  Covid-19 declaration under Section 564(b)(1) of the Act, 21  U.S.C. section 360bbb-3(b)(1), unless the authorization is  terminated or revoked. Performed at [facility], [place] [address], [place], [place] [address]   Blood culture (routine x 2)     Status: None (Preliminary result)   Collection Time: 08/14/20  9:34 PM   Specimen: BLOOD RIGHT HAND  Result Value Ref Range Status   Specimen Description BLOOD RIGHT HAND  Final   Special Requests   Final    BOTTLES DRAWN AEROBIC AND ANAEROBIC Blood Culture adequate volume   Culture   Final    NO GROWTH 4 DAYS Performed at [facility], [place] [address], [place], [place] [address]    Report Status PENDING  Incomplete         Radiology Studies: No results found. Scheduled Meds: . baclofen  5 mg Oral TID  . Chlorhexidine Gluconate Cloth  6 each Topical Daily  . cholestyramine  4 g Oral BID  . diphenoxylate-atropine  1 tablet  Oral QID  . dorzolamide-timolol  1 drop Left Eye BID  . feeding supplement  237 mL Oral TID BM  . finasteride  5 mg Oral QHS  . heparin  5,000 Units Subcutaneous Q8H  . latanoprost  1 drop Left Eye QHS  . loperamide  2 mg Oral BID  . octreotide  150 mcg Subcutaneous Q12H  . sodium chloride flush  3 mL Intravenous Q12H  . tamsulosin  0.4 mg Oral QPC supper   Continuous Infusions: . ceFEPime (MAXIPIME) IV 2 g (08/18/20 1334)     LOS: 4 days   Time spent: 42min  Domenic Polite, MD [facility] 08/18/2020, 1:43 PM

## 2020-08-19 DIAGNOSIS — R4182 Altered mental status, unspecified: Secondary | ICD-10-CM

## 2020-08-19 LAB — CULTURE, BLOOD (ROUTINE X 2)
Culture: NO GROWTH
Culture: NO GROWTH
Special Requests: ADEQUATE

## 2020-08-19 MED ORDER — BACLOFEN 10 MG PO TABS
5.0000 mg | ORAL_TABLET | Freq: Three times a day (TID) | ORAL | Status: DC | PRN
  Administered 2020-08-19: 5 mg via ORAL
  Filled 2020-08-19: qty 1

## 2020-08-19 MED ORDER — BACLOFEN 10 MG PO TABS: 5.0000 mg | ORAL_TABLET | Freq: Three times a day (TID) | ORAL | Status: DC | PRN

## 2020-08-19 MED ORDER — LOPERAMIDE HCL 2 MG PO CAPS
2.0000 mg | ORAL_CAPSULE | Freq: Three times a day (TID) | ORAL | Status: DC
  Administered 2020-08-19 – 2020-08-22 (×11): 2 mg via ORAL
  Filled 2020-08-19 (×11): qty 1

## 2020-08-19 NOTE — Progress Notes (Signed)
Daily Progress Note   Patient Name: Charles Schmidt       Date: 08/19/2020 DOB: 1941-08-14  Age: 79 y.o. MRN#: [id number] Attending Physician: Domenic Polite, MD Primary Care Physician: Nolene Ebbs, MD Admit Date: 08/14/2020  Reason for Consultation/Follow-up: Establishing goals of care  Subjective: Chart review performed. Received report from primary RN - no acute concerns. RN states diarrhea is well managed, he is eating and drinking well, and his hiccups are better managed.  Went to visit patient at bedside - son/Charles Schmidt was present. Patient was lying in bed awake, alert, oriented, and able to participate in conversation. No signs or non-verbal gestures of pain or discomfort noted. No respiratory distress, increased work of breathing, or secretions noted. Patient denies pain or shortness of breath; does feel weak. Patient is drinking nutritional supplement during visit.  Patient tells me that he and his son have been talking about disposition. The patient tells me that he ultimately has decided to discharge to SNF rehab to try and gain some of his strength with the long term goal of home hospice after rehab. Offered and explained outpatient Palliative Care - the patient is agreeable for them to follow. [name] explains that he would prefer discharge to [facility]; however, he was told they could not administer his octreotide BID. He wants to go to a facility that can administer this medication.   Introduced, reviewed, and completed MOST form as outlined in Recommendation section below. Patient originally stated he wanted to continue DNR/DNI; however, after discussion with his son decided to change code status to full code with intubation if needed. The patient stated that if he is  "brought back" just give me morphine and "let me go." In depth education and discussion was had around code status. Encouraged patient/family to consider DNR/DNI status understanding evidenced based poor outcomes in similar hospitalized patient, as the cause of arrest is likely associated with advanced chronic/terminal illness rather than an easily reversible acute cardio-pulmonary event. Patient/Family were not agreeable to DNR/DNI with understanding that he would receive CPR, defibrillation, ACLS medications, and intubation. Reviewed hospice philosophy - son stated "we are not in hospice right now." Gently explained that I wanted to review all information since the patient's ultimate goal is home hospice, and wanted to make sure they didn't have any questions/could use  information given to them to make the most informed decisions.   All questions and concerns addressed. Encouraged to call with questions and/or concerns. PMT card provided.  Returned to patient's room to discuss shredding his current DNR form - son was not present when I arrived. Patient stated "get rid of this" (grabbing durable DNR) and do what you can for me. Outlined again this meant CPR, defibrillation, ACLS medications, and intubation - patient was agreeable. Durable DNR from shadow chart was put in shredder per patient's wishes.  Length of Stay: 5  Current Medications: Scheduled Meds:  . Chlorhexidine Gluconate Cloth  6 each Topical Daily  . cholestyramine  4 g Oral BID  . diphenoxylate-atropine  1 tablet Oral QID  . dorzolamide-timolol  1 drop Left Eye BID  . feeding supplement  237 mL Oral TID BM  . finasteride  5 mg Oral QHS  . heparin  5,000 Units Subcutaneous Q8H  . latanoprost  1 drop Left Eye QHS  . loperamide  2 mg Oral TID  . octreotide  150 mcg Subcutaneous Q12H  . sodium chloride flush  3 mL Intravenous Q12H  . tamsulosin  0.4 mg Oral QPC supper    Continuous Infusions: . ceFEPime (MAXIPIME) IV 2 g  (08/19/20 0156)    PRN Meds: baclofen, fentaNYL (SUBLIMAZE) injection, metoprolol tartrate, ondansetron **OR** ondansetron (ZOFRAN) IV, oxyCODONE, polyethylene glycol, traMADol  Physical Exam Vitals and nursing note reviewed.  Constitutional:      General: He is not in acute distress.    Appearance: He is ill-appearing.     Comments: Frail appearing  Pulmonary:     Effort: No respiratory distress.  Skin:    General: Skin is warm and dry.  Neurological:     Mental Status: He is alert and oriented to person, place, and time.     Motor: Weakness present.  Psychiatric:        Attention and Perception: Attention normal.        Behavior: Behavior is cooperative.        Cognition and Memory: Cognition and memory normal.             Vital Signs: BP 125/60 (BP Location: Left Arm)   Pulse 70   Temp 98.1 F (36.7 C) (Oral)   Resp 20   Ht 6\' 4"  (1.93 m)   Wt 94.8 kg   SpO2 98%   BMI 25.44 kg/m  SpO2: SpO2: 98 % O2 Device: O2 Device: Room Air O2 Flow Rate:    Intake/output summary:   Intake/Output Summary (Last 24 hours) at 08/19/2020 1106 Last data filed at 08/19/2020 0900 Gross per 24 hour  Intake 587.81 ml  Output 625 ml  Net -37.19 ml   LBM: Last BM Date: 08/18/20 Baseline Weight: Weight: 91.6 kg Most recent weight: Weight: 94.8 kg       Palliative Assessment/Data: PPS 40%      Patient Active Problem List   Diagnosis Date Noted  . Protein-calorie malnutrition, severe 08/16/2020  . Intractable vomiting   . Diarrhea   . Carcinoid syndrome ([place])   . Syncope 08/14/2020  . Acute metabolic encephalopathy 89/21/1941  . CKD (chronic kidney disease) stage 3, GFR 30-59 ml/min (HCC) 08/14/2020  . Staphylococcus epidermidis bacteremia   . Pressure injury of skin 06/12/2020  . Oliguria   . Palliative care by specialist   . Goals of care, counseling/discussion   . DNR (do not resuscitate)   . Acute renal failure ([place])   .  Abdominal pain   . Elevated troponin   .  Lives alone 06/06/2020  . Nonspecific elevation of levels of transaminase or lactic acid dehydrogenase (LDH) 06/06/2020  . Increased ammonia level 06/06/2020  . Direct hyperbilirubinemia 06/06/2020  . Bowel wall thickening 06/06/2020  . Vomiting, persistent, in adult 06/06/2020  . Anorexia 06/06/2020  . Colitis   . Elevated liver enzymes   . Nausea and vomiting in adult   . Dehydration   . Physical debility 06/05/2020  . Genetic testing 12/18/2017  . Metastatic malignant neuroendocrine tumor to liver ([place]) 10/28/2017  . Essential hypertension (primary) 05/04/2017  . Hypokalemia 05/04/2017  . Benign prostatic hyperplasia 05/04/2017  . Glaucoma 05/04/2017  . Benign hypertensive heart disease without heart failure 05/04/2017  . Microcytic anemia 05/04/2017  . Prediabetes 05/04/2017  . Hypocalcemia 05/04/2017  . Psoriasis 05/04/2017    Palliative Care Assessment & Plan   Patient Profile: 79 y.o.malewith past medical history of metastatic carcinoid tumor, BPH s/p laser vaporization, and HTN presenting to the emergency departmenton11/23/2021with syncopal episode and vomiting.Upon EMS arrival, his systolic BP was in the 33'A, he was given IV fluid and zofran.  In the ED, patient states to the admitting physician "give me morphine, let me die". Labs reveal hypokalemia, creatinine 1.33, lactic acid 3.5 down to 2.4. Chest x-ray showed bibasilar atelectasis. CT head without acute abnormality. CT abdomen shows increased ascites, small bowel thickening and perienteric stranding seen on previous study in the setting of central mesenteric mass that encases mesenteric vasculature.  Patient is followed by outpatient oncology - Dr. Benay Spice.   Palliative was consulted to assist with goals of care discussions in the setting of advanced cancer and declining health state. Patient is known to PMT from his recent hospitalization 06/05/20 to 07/05/20.  Assessment: Metastatic carcinoid tumor  with central mesenteric mass, extensive liver metastasis, worsening malignant ascites Hypotension/hypovolemia Nausea/vomiting/diarrhea CKD stage 3 Severe protein calorie malnutrition Pseudomonas UTI  Recommendations/Plan:  Continue current medical treatment  Initiated full code status per patient's wishes  Patient wants discharge to SNF rehab with outpatient Palliative Care; wants to try and gain enough strength to be able to go home with home hospice  Patient wanted to discharge to [facility] but remembered they could not administer his octreotide; wants to go to a facility that can administer this medication  TOC consulted for outpatient Palliative Care referral  MOST form completed as follows: Attempt Resuscitation, Full Scope of Treatment, Determine use or limitation of antibiotics when infection occurs, IV fluids for a defined trial period, Feeding tube for a defined trial period. Original MOST form placed in shadow chart; copy was made and will be scanned into Vynca.  Continue baclofen 25mg  PO TID PRN for hiccups  Continue current medication regimen for diarrhea management  PMT will continue to follow holistically  Goals of Care and Additional Recommendations:  Limitations on Scope of Treatment: Full Scope Treatment  Code Status:    Code Status Orders  (From admission, onward)         Start     Ordered   08/14/20 2106  Do not attempt resuscitation (DNR)  Continuous       Question Answer Comment  In the event of cardiac or respiratory ARREST Do not call a "code blue"   In the event of cardiac or respiratory ARREST Do not perform Intubation, CPR, defibrillation or ACLS   In the event of cardiac or respiratory ARREST Use medication by any route, position, wound care, and  other measures to relive pain and suffering. May use oxygen, suction and manual treatment of airway obstruction as needed for comfort.      08/14/20 2105        Code Status History     Date Active Date Inactive Code Status Order ID Comments User Context   06/08/2020 0907 07/05/2020 1955 DNR [id number]  Rosezella Rumpf, NP Inpatient   06/05/2020 1738 06/08/2020 0906 Full Code [id number]  Danna Hefty, DO ED   Advance Care Planning Activity       Prognosis:  Poor prognosis considering his advanced disease, malnutrition, and multiple comorbidities  Discharge Planning:  [facility] for rehab with Palliative care service follow-up  Care plan was discussed with patient, son/Charles Schmidt, Dr. Broadus John, [facility], primary RN  Thank you for allowing the Palliative Medicine Team to assist in the care of this patient.   Total Time 40 minutes Prolonged Time Billed  no       Greater than 50%  of this time was spent counseling and coordinating care related to the above assessment and plan.  Lin Landsman, NP  Please contact Palliative Medicine Team phone at [phone] for questions and concerns.

## 2020-08-19 NOTE — Progress Notes (Signed)
PROGRESS NOTE    Charles Schmidt  GEZ:[id number] DOB: 07/14/1941 DOA: 08/14/2020 PCP: Nolene Ebbs, MD  Brief Narrative: 79/M with widely metastatic carcinoid tumor, BPH, hypertension had a long hospital stay recently notable for bacteremia, diarrhea, failure to thrive, hospice was considered at the time however given clinical improvement he was discharged to rehab. -Sent to the ED from rehab with syncope, vomiting and hypotension.  When EMS arrived systolic blood pressure was in the 50s, he was given IV fluids. -On discussion patient requested hospice and wanted the pneumonia more more arousable of getting to the [place] to let him pass away. -Vital signs was subsequently stable, labs noted hypokalemia, mild AKI, mildly elevated lactic acidosis, CT abdomen pelvis noted increase ascites, small bowel thickening and perinephric stranding.  Assessment & Plan:   Hypotension Hypovolemia Chronic diarrhea -Sepsis ruled out, no fever, leukocytosis tachycardia or tachypnea -secondary to ongoing GI losses, chronic diarrhea and intermittent vomiting -Diarrhea has improved, continue Questran Lomotil Imodium and octreotide -Hydrated with saline, blood pressure improved, lactic acidosis has improved as well -Blood pressure stable now, fluids discontinued  Widely metastatic carcinoid tumor with central mesenteric mass, extensive liver metastasis, worsening malignant ascites -CT with disease progression, his albumin is 1.8 with third spacing -In my opinion his prognosis is very poor, called and updated patients daughter regarding this and that hospice is most appropriate -Palliative care following -Restarted Lomotil, Imodium, Questran and octreotide -Appreciate Dr. Gearldine Shown input, found to have poor prognosis given advanced age carcinoid and multiple comorbidities however could potentially live longer if symptoms are controlled  Pseudomonas UTI versus colonization Urinary retention/BPH with chronic  Foley -Foley catheter placed during previous hospitalization in October  -oncology concerned Pseudomonas in urine culture is a true infection, recommends antibiotics will treat with cefepime , day 3 now, change Foley catheter, DC Abx after catheter changed  Chronic kidney disease stage IIIa -Baseline creatinine around 1.2,  -Stable  Severe protein calorie malnutrition 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 Benay Spice -Patient declines hospice at this time, most appropriate for home with hospice services however if continues to remain stable will plan SNF with palliative care   DVT prophylaxis: Subcu heparin Code Status: DNR Family Communication: No family at bedside, updated patient's daughter few days ago Disposition Plan:  Status is: Inpatient  Remains inpatient appropriate because:Inpatient level of care appropriate due to severity of illness   Dispo: The patient is from: SNF              Anticipated d/c is to: SNF              Anticipated d/c date is: Likely Monday              Patient currently is not medically stable to d/c.  Consultants: Palliative medicine, oncology   Procedures:   Antimicrobials:    Subjective: -Feels better overall, diarrhea is less frequent  Objective: Vitals:   08/18/20 1700 08/18/20 2053 08/19/20 0457 08/19/20 0903  BP: 137/83 (!) 139/91 126/79 125/60  Pulse: 83 80 83 70  Resp: 18 18 18 20   Temp: 97.7 F (36.5 C) 98.7 F (37.1 C) 98.7 F (37.1 C) 98.1 F (36.7 C)  TempSrc: Oral Oral  Oral  SpO2: 98% 98% 97% 98%  Weight:      Height:        Intake/Output Summary (Last 24 hours)  at 08/19/2020 1254 Last data filed at 08/19/2020 0900 Gross per 24 hour  Intake 587.81 ml  Output 625 ml  Net -37.19 ml   Filed Weights   08/16/20 0257 08/17/20 2018 08/18/20 0537  Weight: 91.6 kg 93.4 kg 94.8 kg    Examination:  General exam: Elderly chronically ill male, laying in bed, awake alert oriented to self and place, cognitive deficits noted CVS: S1-S2, regular rate rhythm Lungs: Decreased breath sounds at both bases Abdomen: Soft, distended, nontender, bowel sounds present Extremities: 1-2+ edema Psych: Poor insight and judgment    Data Reviewed:   CBC: Recent Labs  Lab 08/14/20 1020 08/14/20 1149 08/15/20 0329 08/16/20 0158 08/17/20 0303  WBC 6.8  --  8.8 6.6 6.9  NEUTROABS 5.4  --   --   --   --   HGB 9.6* 10.9* 10.3* 8.9* 8.5*  HCT 30.1* 32.0* 30.1* 26.3* 25.3*  MCV 80.7  --  74.3* 74.5* 75.3*  PLT 145*  --  111* 110* 948*   Basic Metabolic Panel: Recent Labs  Lab 08/14/20 1020 08/14/20 1149 08/15/20 0329 08/16/20 0158 08/17/20 0303  NA 140 143 142 142 137  K 3.3* 3.2* 4.4 3.9 3.7  CL 108  --  110 110 107  CO2 19*  --  17* 21* 20*  GLUCOSE 143*  --  98 119* 119*  BUN 16  --  18 23 23   CREATININE 1.33*  --  1.44* 1.57* 1.41*  CALCIUM 8.1*  --  8.1* 7.8* 7.6*   GFR: Estimated Creatinine Clearance: 52.2 mL/min (A) (by C-G formula based on SCr of 1.41 mg/dL (H)). Liver Function Tests: Recent Labs  Lab 08/14/20 1020 08/15/20 0329  AST 48* 616*  ALT 18 30  ALKPHOS 185* 166*  BILITOT 1.4* 1.3*  PROT 6.6 6.1*  ALBUMIN 2.2* 1.8*   Recent Labs  Lab 08/14/20 1020  LIPASE 19   Recent Labs  Lab 08/14/20 1228  AMMONIA 52*   Coagulation Profile: Recent Labs  Lab 08/14/20 1020  INR 1.4*   Cardiac Enzymes: No results for input(s): CKTOTAL, CKMB, CKMBINDEX, TROPONINI in the last 168 hours. BNP (last 3 results) No results for input(s): PROBNP in the last 8760 hours. HbA1C: No results for input(s): HGBA1C in the last 72  hours. CBG: Recent Labs  Lab 08/14/20 2110  GLUCAP 98   Lipid Profile: No results for input(s): CHOL, HDL, LDLCALC, TRIG, CHOLHDL, LDLDIRECT in the last 72 hours. Thyroid Function Tests: No results for input(s): TSH, T4TOTAL, FREET4, T3FREE, THYROIDAB in the last 72 hours. Anemia Panel: No results for input(s): VITAMINB12, FOLATE, FERRITIN, TIBC, IRON, RETICCTPCT in the last 72 hours. Urine analysis:    Component Value Date/Time   COLORURINE YELLOW 08/14/2020 1135   APPEARANCEUR CLOUDY (A) 08/14/2020 1135   LABSPEC 1.005 08/14/2020 1135   PHURINE 6.0 08/14/2020 1135   GLUCOSEU NEGATIVE 08/14/2020 1135   HGBUR LARGE (A) 08/14/2020 1135   BILIRUBINUR NEGATIVE 08/14/2020 1135   KETONESUR NEGATIVE 08/14/2020 1135   PROTEINUR NEGATIVE 08/14/2020 1135   UROBILINOGEN 0.2 10/22/2014 2058   NITRITE NEGATIVE 08/14/2020 1135   LEUKOCYTESUR LARGE (A) 08/14/2020 1135   Sepsis Labs: @LABRCNTIP (procalcitonin:4,lacticidven:4)  ) Recent Results (from the past 240 hour(s))  Urine culture     Status: Abnormal   Collection Time: 08/14/20 10:30 AM   Specimen: In/Out Cath  Urine  Result Value Ref Range Status   Specimen Description IN/OUT CATH URINE  Final   Special Requests   Final    NONE Performed at [facility], [address], [place], [place] [address]    Culture >=100,000 COLONIES/mL PSEUDOMONAS AERUGINOSA (A)  Final   Report Status 08/17/2020 FINAL  Final   Organism ID, Bacteria PSEUDOMONAS AERUGINOSA (A)  Final      Susceptibility   Pseudomonas aeruginosa - MIC*    CEFTAZIDIME <=1 SENSITIVE Sensitive     CIPROFLOXACIN <=0.25 SENSITIVE Sensitive     GENTAMICIN <=1 SENSITIVE Sensitive     IMIPENEM 2 SENSITIVE Sensitive     PIP/TAZO <=4 SENSITIVE Sensitive     CEFEPIME 2 SENSITIVE Sensitive     * >=100,000 COLONIES/mL PSEUDOMONAS AERUGINOSA  Blood culture (routine x 2)     Status: None   Collection Time: 08/14/20 11:03 AM   Specimen: BLOOD  Result Value Ref Range  Status   Specimen Description BLOOD SITE NOT SPECIFIED  Final   Special Requests   Final    BOTTLES DRAWN AEROBIC AND ANAEROBIC Blood Culture results may not be optimal due to an inadequate volume of blood received in culture bottles   Culture   Final    NO GROWTH 5 DAYS Performed at [facility], [place] [address], [place], [place] [address]    Report Status 08/19/2020 FINAL  Final  Respiratory Panel by RT PCR (Flu A&B, Covid) - Nasopharyngeal Swab     Status: None   Collection Time: 08/14/20 11:58 AM   Specimen: Nasopharyngeal Swab; Nasopharyngeal(NP) swabs in vial transport medium  Result Value Ref Range Status   SARS Coronavirus 2 by RT PCR NEGATIVE NEGATIVE Final    Comment: (NOTE) SARS-CoV-2 target nucleic acids are NOT DETECTED.  The SARS-CoV-2 RNA is generally detectable in upper respiratoy specimens during the acute phase of infection. The lowest concentration of SARS-CoV-2 viral copies this assay can detect is 131 copies/mL. A negative result does not preclude SARS-Cov-2 infection and should not be used as the sole basis for treatment or other patient management decisions. A negative result may occur with  improper specimen collection/handling, submission of specimen other than nasopharyngeal swab, presence of viral mutation(s) within the areas targeted by this assay, and inadequate number of viral copies (<131 copies/mL). A negative result must be combined with clinical observations, patient history, and epidemiological information. The expected result is Negative.  Fact Sheet for Patients:  PinkCheek.be  Fact Sheet for Healthcare Providers:  GravelBags.it  This test is no t yet approved or cleared by the [place] FDA and  has been authorized for detection and/or diagnosis of SARS-CoV-2 by FDA under an Emergency Use Authorization (EUA). This EUA will remain  in effect (meaning this test can be used) for  the duration of the COVID-19 declaration under Section 564(b)(1) of the Act, 21 U.S.C. section 360bbb-3(b)(1), unless the authorization is terminated or revoked sooner.     Influenza A by PCR NEGATIVE NEGATIVE Final   Influenza B by PCR NEGATIVE NEGATIVE Final    Comment: (NOTE) The Xpert Xpress SARS-CoV-2/FLU/RSV assay is intended as an aid in  the diagnosis of influenza from Nasopharyngeal swab specimens and  should not be used as a sole basis for treatment. Nasal washings and  aspirates are unacceptable for Xpert Xpress SARS-CoV-2/FLU/RSV  testing.  Fact Sheet for Patients: PinkCheek.be  Fact Sheet for Healthcare Providers: GravelBags.it  This test is not yet approved or cleared  by the [place] and  has been authorized for detection and/or diagnosis of SARS-CoV-2 by  FDA under an Emergency Use Authorization (EUA). This EUA will remain  in effect (meaning this test can be used) for the duration of the  Covid-19 declaration under Section 564(b)(1) of the Act, 21  U.S.C. section 360bbb-3(b)(1), unless the authorization is  terminated or revoked. Performed at [facility], [place] [address] [place], [place] [address]   Blood culture (routine x 2)     Status: None   Collection Time: 08/14/20  9:34 PM   Specimen: BLOOD RIGHT HAND  Result Value Ref Range Status   Specimen Description BLOOD RIGHT HAND  Final   Special Requests   Final    BOTTLES DRAWN AEROBIC AND ANAEROBIC Blood Culture adequate volume   Culture   Final    NO GROWTH 5 DAYS Performed at [facility], [place] [address] [place], [place] [address]    Report Status 08/19/2020 FINAL  Final         Radiology Studies: No results found. Scheduled Meds: . Chlorhexidine Gluconate Cloth  6 each Topical Daily  . cholestyramine  4 g Oral BID  . diphenoxylate-atropine  1 tablet Oral QID  . dorzolamide-timolol  1 drop Left Eye BID  . feeding  supplement  237 mL Oral TID BM  . finasteride  5 mg Oral QHS  . heparin  5,000 Units Subcutaneous Q8H  . latanoprost  1 drop Left Eye QHS  . loperamide  2 mg Oral TID  . octreotide  150 mcg Subcutaneous Q12H  . sodium chloride flush  3 mL Intravenous Q12H  . tamsulosin  0.4 mg Oral QPC supper   Continuous Infusions: . ceFEPime (MAXIPIME) IV 2 g (08/19/20 0156)     LOS: 5 days   Time spent: 57min  Domenic Polite, MD [facility] 08/19/2020, 12:54 PM

## 2020-08-20 DIAGNOSIS — G9341 Metabolic encephalopathy: Secondary | ICD-10-CM | POA: Diagnosis not present

## 2020-08-20 LAB — BASIC METABOLIC PANEL
Anion gap: 11 (ref 5–15)
BUN: 15 mg/dL (ref 8–23)
CO2: 23 mmol/L (ref 22–32)
Calcium: 8 mg/dL — ABNORMAL LOW (ref 8.9–10.3)
Chloride: 104 mmol/L (ref 98–111)
Creatinine, Ser: 1.18 mg/dL (ref 0.61–1.24)
GFR, Estimated: >60 mL/min (ref >60)
Glucose, Bld: 121 mg/dL — ABNORMAL HIGH (ref 70–99)
Potassium: 3.8 mmol/L (ref 3.5–5.1)
Sodium: 138 mmol/L (ref 135–145)

## 2020-08-20 LAB — CBC
HCT: 27.4 % — ABNORMAL LOW (ref 39.0–52.0)
Hemoglobin: 9.1 g/dL — ABNORMAL LOW (ref 13.0–17.0)
MCH: 25 pg — ABNORMAL LOW (ref 26.0–34.0)
MCHC: 33.2 g/dL (ref 30.0–36.0)
MCV: 75.3 fL — ABNORMAL LOW (ref 80.0–100.0)
Platelets: 113 10*3/uL — ABNORMAL LOW (ref 150–400)
RBC: 3.64 MIL/uL — ABNORMAL LOW (ref 4.22–5.81)
RDW: 18.4 % — ABNORMAL HIGH (ref 11.5–15.5)
WBC: 3.7 10*3/uL — ABNORMAL LOW (ref 4.0–10.5)
nRBC: 0 % (ref 0.0–0.2)

## 2020-08-20 NOTE — Care Management Important Message (Signed)
Important Message  Patient Details  Name: Charles Schmidt MRN: [id number] Date of Birth: October 14, 1940   Medicare Important Message Given:  Yes     Ali Mohl P Jarales 08/20/2020, 3:30 PM

## 2020-08-20 NOTE — Progress Notes (Addendum)
IP PROGRESS NOTE  Subjective: Patient reports that he is feeling better today.  Diarrhea significantly improved.  Daughter is at the bedside and reports that he vomited one time earlier today.  Objective: Vital signs in last 24 hours: Blood pressure (!) 141/93, pulse 86, temperature 97.9 F (36.6 C), temperature source Oral, resp. rate 17, height 6' 4"  (1.93 m), weight 94.8 kg, SpO2 98 %.  Intake/Output from previous day: 11/28 0701 - 11/29 0700 In: 1043 [P.O.:840; I.V.:3; IV Piggyback:200] Out: 1750 [Urine:1750]  Physical Exam:  Lungs: Decreased breath sounds at the right lower posterior chest, no respiratory distress Cardiac: Regular rate and rhythm Abdomen: The liver is palpable throughout the upper abdomen, mildly distended Extremities: Trace pitting edema at the legs and feet bilaterally Neurologic: Alert and oriented, follows commands   Lab Results: No results for input(s): WBC, HGB, HCT, PLT in the last 72 hours.  BMET No results for input(s): NA, K, CL, CO2, GLUCOSE, BUN, CREATININE, CALCIUM in the last 72 hours.   Medications: I have reviewed the patient's current medications.  Assessment/Plan: 1.Metastatic carcinoid tumor, biopsy of a liver lesion 10/23/2017 consistent with a well-differentiated neuroendocrine neoplasm, WHO grade 2,[id number]%  CT abdomen/pelvis 10/13/2017-extensive liver metastases, cirrhosis, soft tissue mass in the small bowel mesentery versus a primary small bowel tumor, tiny lucent lesions in the pelvic bones  Elevated chromogranin A and 24-hour urine 5-HIAA  CT chest 11/24/2017-subpleural nodule in the left lower lobe with associated radiotracer activity on comparison DOTATATEPET scan. No additional evidence of thoracic metastasis.  DOTATATE PET scan3/01/2018-intense radiotracer accumulation with innumerable confluent hepatic metastasis; intense radiotracer activity within central mesenteric mass; 2 foci of uptake associated with the small bowel;  intense radiotracer activity associated periaortic and paraspinal lymph nodes; more distant solitary small metastasis within the pleural space left lower lobe.  Monthly Sandostatin initiated 11/26/2017, dose increased to 30 mg 01/21/2018  Cycle 1 Lutathera 03/03/2018, monthly Sandostatin continued  Cycle 2 Lutathera 04/27/2018, monthly Sandostatin continued  Cycle 3 Lutathera 06/23/2018  Cycle 4 Lutathera 08/18/2018  Restaging dotatate PET scan 09/29/2018-multiple lesions again demonstrated within the liver which accumulate the radiotracer. The number of lesions which accumulate the tracer has decreased visually compared to the prior exam. Some lesions are no longer evident along the right hepatic margin. Remaining measurable lesions have radiotracer activity similar to prior. No new lesions present. Central mesenteric mass unchanged and remains avid for radiotracer. Adjacent lesion within the small bowel SUV max equal 10.5 compared to SUV max equal 15.8. Smaller lesion previously identified in the upper pelvis small bowel appears decreased in activity. No new metastatic lesions. Lesion position between the right psoas muscle and spine SUV max equal to 21.8 compared with SUV max equal 26.2.  Dotatate PET 10/19/2019-no evidence of disease progression, stable multifocal hepatic metastases, unchanged from post therapy scan 03/30/2019 and improved from pretherapy scan5/01/2018. Stable mesenteric mass, improved retroperitoneal lymph node adjacent to the celiac trunk, solitary small bowel lesion unchanged  Monthly Sandostatin continued  01/26/2020 chromogranin A level stable elevation  CT abdomen/pelvis 06/05/2020-slight enlargement of central mesenteric mass, extensive hepatic metastases-unchanged, bowel wall thickening in central small bowel loops and right colon  Lanreotide 07/09/2020  2.Diarrhea-secondary to carcinoid syndrome 3.History of anorexia/weight loss 4.Prostatic  hypertrophy;status post laser vaporization 01/05/2018 5. Mild pancytopenia following Lutathera treatment 6.Thoracic dermatomal zoster rash 04/02/2019 treated with Valtrex 7. Admission 06/05/2020 after a fall with failure to thrive, increased diarrhea, nausea, and abdominal pain 8. Markedly elevated AST and CPK on admission September 2021 -  likely rhabdomyolysis 9. Renal insufficiency 10. Staph epidermidis bacteremia and E. coli UTI 06/22/2020 -completed course of IV antibiotics 11.  Admission with hypotension, nausea, and diarrhea 08/14/2020 12.  Anemia-likely secondary to chronic disease and renal insufficiency, chronic Red cell microcytosis likely related to a thalassemia variant or chronic disease 13.  Urinary tract infection-culture from 08/14/2020 + for Pseudomonas aeruginosa  Charles Schmidt has advanced stage metastatic carcinoid tumor.  CT images from hospital admission show extensive tumor involving the liver and a mesenteric mass, essentially unchanged.  Diarrhea is much better controlled at this time with cholestyramine, Lomotil, Imodium, and octreotide 150 mg subcu every 12 hours.  The patient had a Pseudomonas urinary tract infection.  Completed cefepime on 08/20/2020.  He has overall improvement in his clinical status.  The patient is awaiting SNF placement and he would like to participate in rehabilitation.  We will arrange for outpatient follow-up with cancer center.  Recommendations: 1.  Continue cholestyramine, Imodium, and Lomotil 2.  Increase out of bed as tolerated. 3.  Will arrange for outpatient follow-up at the cancer center. 4.  Discontinue octreotide injections, it is unclear whether he is benefiting from this at present   LOS: 6 days   [name], NP   08/20/2020, 9:19 AM Charles Schmidt was interviewed and examined.  His son was at the bedside when I saw him this morning.  His clinical status has improved since hospital admission.  Diarrhea is significantly  improved.  Charles Schmidt does not wish to enter hospice care at present.  I will check on him 08/21/2020.  Outpatient follow-up will be scheduled at the Cancer center.

## 2020-08-20 NOTE — Telephone Encounter (Signed)
Called daughter and made her aware that per Dr. Benay Spice; He is not a transplant candidate and the cancer has not spread to anywhere else in his body.

## 2020-08-20 NOTE — Progress Notes (Signed)
PROGRESS NOTE    Charles Schmidt  KDT:[id number] DOB: Jan 06, 1941 DOA: 08/14/2020 PCP: Nolene Ebbs, MD  Brief Narrative: 79/M with widely metastatic carcinoid tumor, BPH, hypertension had a long hospital stay recently notable for bacteremia, diarrhea, failure to thrive, hospice was considered at the time however given clinical improvement he was discharged to rehab. -Sent to the ED from rehab with syncope, vomiting and hypotension.  When EMS arrived systolic blood pressure was in the 50s, he was given IV fluids. -On discussion patient requested hospice and wanted the pneumonia more more arousable of getting to the [place] to let him pass away. -Vital signs was subsequently stable, labs noted hypokalemia, mild AKI, mildly elevated lactic acidosis, CT abdomen pelvis noted increase ascites, small bowel thickening and perinephric stranding.  Assessment & Plan:   Hypotension Hypovolemia Chronic diarrhea -Sepsis ruled out, no fever, leukocytosis tachycardia or tachypnea -secondary to ongoing GI losses, chronic diarrhea and intermittent vomiting -Hydrated with saline this admission, blood pressure improved and stable now, lactic acidosis has improved -Diarrhea has improved, continue Questran Lomotil Imodium and octreotide -Blood pressure stable now, fluids discontinued  Widely metastatic carcinoid tumor with central mesenteric mass, extensive liver metastasis, worsening malignant ascites Goals of care -Patient is debilitated by advanced metastatic carcinoid tumor with extensive liver mets, worsening malignant ascites, albumin of 1.8 with third spacing, ongoing failure to thrive, and chronic diarrhea  -In my opinion his prognosis is quite poor, called and updated patients daughter regarding this and that hospice maybe most appropriate -Palliative care consulted and following -Restarted Lomotil, Imodium, Questran and octreotide -Appreciate Dr. Gearldine Shown input, felt to have poor prognosis given  advanced age carcinoid and multiple comorbidities however could potentially live longer if symptoms are controlled -Patient at this time has reversed his CODE STATUS from DNR to full code, plan for SNF at discharge with palliative care follow-up, and possibly transition to home hospice services after rehab stay -Will need ongoing palliative care discussions with patient and his son [name]  Pseudomonas UTI versus colonization Urinary retention/BPH with chronic Foley -Foley catheter placed during previous hospitalization in October  -oncology concerned Pseudomonas in urine culture is a true infection, recommends antibiotics will treat with cefepime , changed Foley catheter 11/28, completed 4 days of cefepime, will discontinue antibiotics today  Chronic kidney disease stage IIIa -Baseline creatinine around 1.2,  -Stable  Severe protein calorie malnutrition 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  Consultants: Palliative medicine, oncology   Procedures:   Antimicrobials:    Subjective: -Feels better overall, diarrhea is less frequent  Objective: Vitals:   08/19/20 1628 08/19/20 2018 08/20/20 0531 08/20/20 0900  BP: (!) 140/92 (!) 137/93 (!) 149/97 (!) 141/93  Pulse: 88 88 88 86  Resp: 18 18 18 17   Temp: 98 F (36.7 C) 99.7 F (37.6 C) 98 F (36.7 C)  97.9 F (36.6 C)  TempSrc:  Oral  Oral  SpO2: 97% 98% 100% 98%  Weight:      Height:         Intake/Output Summary (Last 24 hours) at 08/20/2020 1313 Last data filed at 08/20/2020 0800 Gross per 24 hour  Intake 803 ml  Output 1450 ml  Net -647 ml   Filed Weights   08/16/20 0257 08/17/20 2018 08/18/20 0537  Weight: 91.6 kg 93.4 kg 94.8 kg    Examination:  General exam: Elderly chronically ill male, laying in bed, awake alert oriented to self and place, cognitive deficits noted CVS: S1-S2, regular rate rhythm Lungs: Decreased breath sounds at both bases Abdomen: Soft, mildly distended, nontender, bowel sounds present  extremities: 1-2+ edema Psych: Poor insight and judgment    Data Reviewed:   CBC: Recent Labs  Lab 08/14/20 1020 08/14/20 1020 08/14/20 1149 08/15/20 0329 08/16/20 0158 08/17/20 0303 08/20/20 0850  WBC 6.8  --   --  8.8 6.6 6.9 3.7*  NEUTROABS 5.4  --   --   --   --   --   --   HGB 9.6*   < > 10.9* 10.3* 8.9* 8.5* 9.1*  HCT 30.1*   < > 32.0* 30.1* 26.3* 25.3* 27.4*  MCV 80.7  --   --  74.3* 74.5* 75.3* 75.3*  PLT 145*  --   --  111* 110* 101* 113*   < > = values in this interval not displayed.   Basic Metabolic Panel: Recent Labs  Lab 08/14/20 1020 08/14/20 1020 08/14/20 1149 08/15/20 0329 08/16/20 0158 08/17/20 0303 08/20/20 0850  NA 140   < > 143 142 142 137 138  K 3.3*   < > 3.2* 4.4 3.9 3.7 3.8  CL 108  --   --  110 110 107 104  CO2 19*  --   --  17* 21* 20* 23  GLUCOSE 143*  --   --  98 119* 119* 121*  BUN 16  --   --  18 23 23 15   CREATININE 1.33*  --   --  1.44* 1.57* 1.41* 1.18  CALCIUM 8.1*  --   --  8.1* 7.8* 7.6* 8.0*   < > = values in this interval not displayed.   GFR: Estimated Creatinine Clearance: 62.3 mL/min (by C-G formula based on SCr of 1.18 mg/dL). Liver Function Tests: Recent Labs  Lab 08/14/20 1020 08/15/20 0329  AST 48* 616*  ALT 18 30  ALKPHOS 185* 166*  BILITOT 1.4* 1.3*  PROT 6.6 6.1*  ALBUMIN 2.2* 1.8*   Recent Labs  Lab 08/14/20 1020  LIPASE 19   Recent Labs  Lab 08/14/20 1228   AMMONIA 52*   Coagulation Profile: Recent Labs  Lab 08/14/20 1020  INR 1.4*   Cardiac Enzymes: No results for input(s): CKTOTAL, CKMB, CKMBINDEX, TROPONINI in the last 168 hours. BNP (last 3 results) No results for input(s): PROBNP in the last 8760 hours. HbA1C: No results for input(s): HGBA1C in the last 72 hours. CBG: Recent Labs  Lab 08/14/20 2110  GLUCAP 98   Lipid Profile: No results for input(s): CHOL, HDL, LDLCALC, TRIG, CHOLHDL, LDLDIRECT in the last 72 hours. Thyroid Function Tests: No results for input(s): TSH, T4TOTAL, FREET4, T3FREE, THYROIDAB in the last 72 hours. Anemia Panel: No results for input(s): VITAMINB12, FOLATE, FERRITIN, TIBC, IRON, RETICCTPCT in the last 72 hours. Urine analysis:    Component Value Date/Time   COLORURINE YELLOW 08/14/2020 1135  APPEARANCEUR CLOUDY (A) 08/14/2020 1135   LABSPEC 1.005 08/14/2020 1135   PHURINE 6.0 08/14/2020 1135   GLUCOSEU NEGATIVE 08/14/2020 1135   HGBUR LARGE (A) 08/14/2020 1135   BILIRUBINUR NEGATIVE 08/14/2020 1135   KETONESUR NEGATIVE 08/14/2020 1135   PROTEINUR NEGATIVE 08/14/2020 1135   UROBILINOGEN 0.2 10/22/2014 2058   NITRITE NEGATIVE 08/14/2020 1135   LEUKOCYTESUR LARGE (A) 08/14/2020 1135   Sepsis Labs: @LABRCNTIP (procalcitonin:4,lacticidven:4)  ) Recent Results (from the past 240 hour(s))  Urine culture     Status: Abnormal   Collection Time: 08/14/20 10:30 AM   Specimen: In/Out Cath Urine  Result Value Ref Range Status   Specimen Description IN/OUT CATH URINE  Final   Special Requests   Final    NONE Performed at [facility], [place] [address], [place], [place] [address]    Culture >=100,000 COLONIES/mL PSEUDOMONAS AERUGINOSA (A)  Final   Report Status 08/17/2020 FINAL  Final   Organism ID, Bacteria PSEUDOMONAS AERUGINOSA (A)  Final      Susceptibility   Pseudomonas aeruginosa - MIC*    CEFTAZIDIME <=1 SENSITIVE Sensitive     CIPROFLOXACIN <=0.25 SENSITIVE Sensitive      GENTAMICIN <=1 SENSITIVE Sensitive     IMIPENEM 2 SENSITIVE Sensitive     PIP/TAZO <=4 SENSITIVE Sensitive     CEFEPIME 2 SENSITIVE Sensitive     * >=100,000 COLONIES/mL PSEUDOMONAS AERUGINOSA  Blood culture (routine x 2)     Status: None   Collection Time: 08/14/20 11:03 AM   Specimen: BLOOD  Result Value Ref Range Status   Specimen Description BLOOD SITE NOT SPECIFIED  Final   Special Requests   Final    BOTTLES DRAWN AEROBIC AND ANAEROBIC Blood Culture results may not be optimal due to an inadequate volume of blood received in culture bottles   Culture   Final    NO GROWTH 5 DAYS Performed at [facility], [place] [address] [place], [place] [address]    Report Status 08/19/2020 FINAL  Final  Respiratory Panel by RT PCR (Flu A&B, Covid) - Nasopharyngeal Swab     Status: None   Collection Time: 08/14/20 11:58 AM   Specimen: Nasopharyngeal Swab; Nasopharyngeal(NP) swabs in vial transport medium  Result Value Ref Range Status   SARS Coronavirus 2 by RT PCR NEGATIVE NEGATIVE Final    Comment: (NOTE) SARS-CoV-2 target nucleic acids are NOT DETECTED.  The SARS-CoV-2 RNA is generally detectable in upper respiratoy specimens during the acute phase of infection. The lowest concentration of SARS-CoV-2 viral copies this assay can detect is 131 copies/mL. A negative result does not preclude SARS-Cov-2 infection and should not be used as the sole basis for treatment or other patient management decisions. A negative result may occur with  improper specimen collection/handling, submission of specimen other than nasopharyngeal swab, presence of viral mutation(s) within the areas targeted by this assay, and inadequate number of viral copies (<131 copies/mL). A negative result must be combined with clinical observations, patient history, and epidemiological information. The expected result is Negative.  Fact Sheet for Patients:  PinkCheek.be  Fact Sheet for  Healthcare Providers:  GravelBags.it  This test is no t yet approved or cleared by the [place] FDA and  has been authorized for detection and/or diagnosis of SARS-CoV-2 by FDA under an Emergency Use Authorization (EUA). This EUA will remain  in effect (meaning this test can be used) for the duration of the COVID-19 declaration under Section 564(b)(1) of the Act, 21 U.S.C.  section 360bbb-3(b)(1), unless the authorization is terminated or revoked sooner.     Influenza A by PCR NEGATIVE NEGATIVE Final   Influenza B by PCR NEGATIVE NEGATIVE Final    Comment: (NOTE) The Xpert Xpress SARS-CoV-2/FLU/RSV assay is intended as an aid in  the diagnosis of influenza from Nasopharyngeal swab specimens and  should not be used as a sole basis for treatment. Nasal washings and  aspirates are unacceptable for Xpert Xpress SARS-CoV-2/FLU/RSV  testing.  Fact Sheet for Patients: PinkCheek.be  Fact Sheet for Healthcare Providers: GravelBags.it  This test is not yet approved or cleared by the [place] FDA and  has been authorized for detection and/or diagnosis of SARS-CoV-2 by  FDA under an Emergency Use Authorization (EUA). This EUA will remain  in effect (meaning this test can be used) for the duration of the  Covid-19 declaration under Section 564(b)(1) of the Act, 21  U.S.C. section 360bbb-3(b)(1), unless the authorization is  terminated or revoked. Performed at [facility], [place] [address] [place], [place] [address]   Blood culture (routine x 2)     Status: None   Collection Time: 08/14/20  9:34 PM   Specimen: BLOOD RIGHT HAND  Result Value Ref Range Status   Specimen Description BLOOD RIGHT HAND  Final   Special Requests   Final    BOTTLES DRAWN AEROBIC AND ANAEROBIC Blood Culture adequate volume   Culture   Final    NO GROWTH 5 DAYS Performed at [facility], [place] [address] [place], [place] [address]    Report Status 08/19/2020 FINAL  Final     Radiology Studies: No results found. Scheduled Meds: . Chlorhexidine Gluconate Cloth  6 each Topical Daily  . cholestyramine  4 g Oral BID  . diphenoxylate-atropine  1 tablet Oral QID  . dorzolamide-timolol  1 drop Left Eye BID  . feeding supplement  237 mL Oral TID BM  . finasteride  5 mg Oral QHS  . heparin  5,000 Units Subcutaneous Q8H  . latanoprost  1 drop Left Eye QHS  . loperamide  2 mg Oral TID  . octreotide  150 mcg Subcutaneous Q12H  . sodium chloride flush  3 mL Intravenous Q12H  . tamsulosin  0.4 mg Oral QPC supper   Continuous Infusions: . ceFEPime (MAXIPIME) IV 2 g (08/20/20 0246)     LOS: 6 days   Time spent: 61min  Domenic Polite, MD  [facility] 08/20/2020, 1:13 PM

## 2020-08-21 DIAGNOSIS — G9341 Metabolic encephalopathy: Secondary | ICD-10-CM | POA: Diagnosis not present

## 2020-08-21 MED ORDER — OXYCODONE HCL 5 MG PO TABS: 5.0000 mg | ORAL_TABLET | Freq: Four times a day (QID) | ORAL | 0 refills | Status: DC | PRN

## 2020-08-21 MED ORDER — LOPERAMIDE HCL 2 MG PO CAPS: 2.0000 mg | ORAL_CAPSULE | Freq: Three times a day (TID) | ORAL | 0 refills | Status: AC

## 2020-08-21 MED ORDER — OXYCODONE HCL 5 MG PO TABS: 5.0000 mg | ORAL_TABLET | Freq: Four times a day (QID) | ORAL | 0 refills | Status: AC | PRN

## 2020-08-21 MED ORDER — OCTREOTIDE ACETATE 100 MCG/ML IJ SOLN
150.0000 ug | Freq: Two times a day (BID) | INTRAMUSCULAR | Status: DC
  Administered 2020-08-21 – 2020-08-22 (×2): 150 ug via SUBCUTANEOUS
  Filled 2020-08-21 (×4): qty 1.5

## 2020-08-21 MED ORDER — OCTREOTIDE ACETATE 100 MCG/ML IJ SOLN: 150.0000 ug | Freq: Two times a day (BID) | INTRAMUSCULAR | Status: AC

## 2020-08-21 MED ORDER — OCTREOTIDE ACETATE 100 MCG/ML IJ SOLN: 100.0000 ug | Freq: Two times a day (BID) | INTRAMUSCULAR | Status: DC

## 2020-08-21 NOTE — Progress Notes (Signed)
Patient refused to take Covid Swab for the placement.

## 2020-08-21 NOTE — Progress Notes (Addendum)
IP PROGRESS NOTE  Subjective: The patient feels better overall today.  No nausea or vomiting reported.  Diarrhea controlled.  Octreotide was discontinued yesterday.  He is afebrile.  The patient is being discharged to SNF today.  Objective: Vital signs in last 24 hours: Blood pressure (!) 139/98, pulse 94, temperature 98.2 F (36.8 C), temperature source Oral, resp. rate 17, height 6' 4"  (1.93 m), weight 96.6 kg, SpO2 99 %.  Intake/Output from previous day: 11/29 0701 - 11/30 0700 In: 1080 [P.O.:840] Out: 600 [Urine:600]  Physical Exam:  Lungs: Decreased breath sounds at the right lower posterior chest, no respiratory distress Cardiac: Regular rate and rhythm Abdomen: The liver is palpable throughout the upper abdomen, mildly distended Extremities: Trace pitting edema at the legs and feet bilaterally Neurologic: Alert and oriented, follows commands   Lab Results: Recent Labs    08/20/20 0850  WBC 3.7*  HGB 9.1*  HCT 27.4*  PLT 113*    BMET Recent Labs    08/20/20 0850  NA 138  K 3.8  CL 104  CO2 23  GLUCOSE 121*  BUN 15  CREATININE 1.18  CALCIUM 8.0*     Medications: I have reviewed the patient's current medications.  Assessment/Plan: 1.Metastatic carcinoid tumor, biopsy of a liver lesion 10/23/2017 consistent with a well-differentiated neuroendocrine neoplasm, WHO grade 2,[id number]%  CT abdomen/pelvis 10/13/2017-extensive liver metastases, cirrhosis, soft tissue mass in the small bowel mesentery versus a primary small bowel tumor, tiny lucent lesions in the pelvic bones  Elevated chromogranin A and 24-hour urine 5-HIAA  CT chest 11/24/2017-subpleural nodule in the left lower lobe with associated radiotracer activity on comparison DOTATATEPET scan. No additional evidence of thoracic metastasis.  DOTATATE PET scan3/01/2018-intense radiotracer accumulation with innumerable confluent hepatic metastasis; intense radiotracer activity within central mesenteric mass;  2 foci of uptake associated with the small bowel; intense radiotracer activity associated periaortic and paraspinal lymph nodes; more distant solitary small metastasis within the pleural space left lower lobe.  Monthly Sandostatin initiated 11/26/2017, dose increased to 30 mg 01/21/2018  Cycle 1 Lutathera 03/03/2018, monthly Sandostatin continued  Cycle 2 Lutathera 04/27/2018, monthly Sandostatin continued  Cycle 3 Lutathera 06/23/2018  Cycle 4 Lutathera 08/18/2018  Restaging dotatate PET scan 09/29/2018-multiple lesions again demonstrated within the liver which accumulate the radiotracer. The number of lesions which accumulate the tracer has decreased visually compared to the prior exam. Some lesions are no longer evident along the right hepatic margin. Remaining measurable lesions have radiotracer activity similar to prior. No new lesions present. Central mesenteric mass unchanged and remains avid for radiotracer. Adjacent lesion within the small bowel SUV max equal 10.5 compared to SUV max equal 15.8. Smaller lesion previously identified in the upper pelvis small bowel appears decreased in activity. No new metastatic lesions. Lesion position between the right psoas muscle and spine SUV max equal to 21.8 compared with SUV max equal 26.2.  Dotatate PET 10/19/2019-no evidence of disease progression, stable multifocal hepatic metastases, unchanged from post therapy scan 03/30/2019 and improved from pretherapy scan5/01/2018. Stable mesenteric mass, improved retroperitoneal lymph node adjacent to the celiac trunk, solitary small bowel lesion unchanged  Monthly Sandostatin continued  01/26/2020 chromogranin A level stable elevation  CT abdomen/pelvis 06/05/2020-slight enlargement of central mesenteric mass, extensive hepatic metastases-unchanged, bowel wall thickening in central small bowel loops and right colon  Lanreotide 07/09/2020  2.Diarrhea-secondary to carcinoid syndrome 3.History of  anorexia/weight loss 4.Prostatic hypertrophy;status post laser vaporization 01/05/2018 5. Mild pancytopenia following Lutathera treatment 6.Thoracic dermatomal zoster rash 04/02/2019 treated with  Valtrex 7. Admission 06/05/2020 after a fall with failure to thrive, increased diarrhea, nausea, and abdominal pain 8. Markedly elevated AST and CPK on admission September 2021 -likely rhabdomyolysis 9. Renal insufficiency 10. Staph epidermidis bacteremia and E. coli UTI 06/22/2020 -completed course of IV antibiotics 11.  Admission with hypotension, nausea, and diarrhea 08/14/2020 12.  Anemia-likely secondary to chronic disease and renal insufficiency, chronic Red cell microcytosis likely related to a thalassemia variant or chronic disease 13.  Urinary tract infection-culture from 08/14/2020 + for Pseudomonas aeruginosa  Charles Schmidt has advanced stage metastatic carcinoid tumor.  CT images from hospital admission show extensive tumor involving the liver and a mesenteric mass, essentially unchanged.  Diarrhea remains controlled with cholestyramine, Lomotil, Imodium.  The patient had a Pseudomonas urinary tract infection.  Completed cefepime on 08/20/2020.  He has overall improvement in his clinical status.  The patient is scheduled for discharge today to SNF participate in rehab.  Outpatient follow-up has already been scheduled at the cancer center on 08/31/2020.  Recommendations: 1.  Continue cholestyramine, Imodium, and Lomotil 2.  Increase out of bed as tolerated. 3.  Recommend for SNF to consider DC of Foley catheter. 4.  Outpatient follow-up with cancer center as previously scheduled on 12/10.   LOS: 7 days   Charles Bussing, NP   08/21/2020, 11:55 AM Charles Schmidt was interviewed and examined. He appears stable. He is not having significant diarrhea. The plan is to continue monthly lanreotide. I discussed the case with Dr. Leonia Reeves. He will see Charles Schmidt to consider repeat treatment  with Lutathera.  Julieanne Manson, MD

## 2020-08-21 NOTE — TOC Progression Note (Signed)
Transition of Care [facility]) - Progression Note    Patient Details  Name: [name] MRN: [id number] Date of Birth: April 24, 1941  Transition of [facility]) CM/SW Contact  Joanne Chars, LCSW Phone Number: 08/21/2020, 4:22 PM  Clinical Narrative:   CSW met with pt regarding return to [employer] before lunch, pt upset, refused to discuss.  Pt then refused to have covid testing completed.  CSW called to meet with pt by RN at 1545, pt calm but stating he did not want to discharge today due to throwing up earlier.  Pt then went on to say he had some concerns about being forced to engage in some PT activities at [facility] when he would have preferred not to, he stated he needed to "negotiate" his return and will consider returning there tomorrow based on how the negotiation goes.  Daughter present in the room and trying to be helpful.  CSW spoke with Harriet Pho of [facility] who will be on site tomorrow and agreed to meet with pt to talk about resolving any issues.  Pt is asking about possibly going to other facilities.          Expected Discharge Plan and Services                                                 Social Determinants of Health (SDOH) Interventions    Readmission Risk Interventions No flowsheet data found.

## 2020-08-21 NOTE — Plan of Care (Signed)
  Problem: Clinical Measurements: Goal: Ability to maintain clinical measurements within normal limits will improve Outcome: Completed/Met Goal: Will remain free from infection Outcome: Completed/Met Goal: Diagnostic test results will improve Outcome: Completed/Met Goal: Cardiovascular complication will be avoided Outcome: Completed/Met   Problem: Activity: Goal: Risk for activity intolerance will decrease Outcome: Completed/Met   Problem: Nutrition: Goal: Adequate nutrition will be maintained Outcome: Completed/Met   Problem: Elimination: Goal: Will not experience complications related to bowel motility Outcome: Completed/Met Goal: Will not experience complications related to urinary retention Outcome: Completed/Met   Problem: Pain Managment: Goal: General experience of comfort will improve Outcome: Completed/Met   Problem: Safety: Goal: Ability to remain free from injury will improve Outcome: Completed/Met   Problem: Skin Integrity: Goal: Risk for impaired skin integrity will decrease Outcome: Completed/Met

## 2020-08-21 NOTE — Discharge Summary (Addendum)
Physician Discharge Summary  Charles Schmidt IOE:[id number] DOB: 12-20-1940 DOA: 08/14/2020  PCP: Nolene Ebbs, MD  Admit date: 08/14/2020 Discharge date: 08/21/2020  Time spent: 35 minutes  Recommendations for Outpatient Follow-up:  1. PCP in 1 week 2. Oncology Dr. Benay Spice in 2 weeks 3. Palliative care follow-up at SNF 4. Continue ongoing goals of care and CODE STATUS discussions with patient and his [name] 5. 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)   Bowel wall thickening   Nausea and vomiting in adult   Goals of care, counseling/discussion   Syncope   CKD (chronic kidney disease) stage 3, GFR 30-59 ml/min (HCC)   Intractable vomiting   Diarrhea   Carcinoid syndrome ([place])   Protein-calorie malnutrition, severe   Discharge Condition: Stable  Diet recommendation: Low-sodium  Filed Weights   08/17/20 2018 08/18/20 0537 08/20/20 2048  Weight: 93.4 kg 94.8 kg 96.6 kg    History of present illness:  79/M with widely metastatic carcinoid tumor, BPH, hypertension had a long hospital stay recently notable for bacteremia, diarrhea, failure to thrive, hospice was considered at the time however given clinical improvement he was discharged to rehab. -Sent to the ED from rehab with syncope, vomiting and hypotension.  When EMS arrived systolic blood pressure was in the 50s, he was given IV fluids. -On discussion patient requested hospice and wanted the pneumonia more more arousable of getting to  the [place] to let him pass away. -Vital signs was subsequently stable, labs noted hypokalemia, mild AKI, mildly elevated lactic acidosis, CT abdomen pelvis noted increase ascites, liver mets, small bowel thickening and perinephric stranding  Hospital Course:   Hypotension Hypovolemia Chronic diarrhea -Sepsis ruled out, no fever, leukocytosis tachycardia or tachypnea -secondary to ongoing GI losses, chronic diarrhea and intermittent vomiting -Hydrated with saline this admission, blood pressure improved and stable now, lactic acidosis has improved -Diarrhea has improved, continue Questran, Lomotil,  Imodium and octreotide -Blood pressure stable now, fluids discontinued  Metastatic carcinoid tumor with central mesenteric mass, extensive liver metastasis, malignant ascites Palliative care encounter -Patient is debilitated by advanced metastatic carcinoid tumor with extensive liver mets, worsening malignant ascites, albumin of 1.8 with third spacing, ongoing failure to thrive, and chronic diarrhea  -In my opinion his prognosis is quite poor, called and updated patients daughter regarding this and that hospice maybe most appropriate -Palliative care consulted and following -Restarted Lomotil, Imodium, Questran and octreotide with improvement in his diarrhea, his oral intake is a little better -Appreciate Dr. Gearldine Shown input, felt to have poor prognosis given advanced age carcinoid and multiple comorbidities however could potentially live longer if symptoms are controlled -Followed closely by palliative care team this admission, status post multiple family meetings, Patient at this time under the directive of his son has reversed his CODE STATUS from DNR to full code, plan for SNF at discharge with palliative care follow-up, and patient mentioned his plan was to transition to home hospice services after rehab -Will need ongoing palliative care discussions with patient and his [name]  Pseudomonas  UTI versus colonization Urinary retention/BPH with chronic Foley -Foley catheter  placed during previous hospitalization in October  -oncology concerned Pseudomonas in urine culture is a true infection, recommended antibiotics, treated with IV cefepime, changed Foley catheter 11/28, completed 4 days of cefepime, discontinued antibiotics yesterday  AKI on chronic kidney disease stage II -Baseline creatinine around 1.2,  -Stable  Severe protein calorie malnutrition 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 -With temporal wasting, cachexia from cancer, severe hypoalbuminemia, seen by dietitian, supplements recommended  BPH  -continue Proscar and Flomax  Palliative care encounter -See discussion above   Consultations:  Oncology  Palliative medicine  Discharge Exam: Vitals:   08/21/20 0635 08/21/20 0938  BP: 132/89 (!) 139/98  Pulse: 90 94  Resp: 18 17  Temp: 98 F (36.7 C) 98.2 F (36.8 C)  SpO2: 98% 99%    General: Awake and alert, oriented to self, place and partly to time, mild cognitive deficits Cardiovascular: S1-S2, regular rate rhythm Respiratory: Decreased breath sounds to bases  Discharge Instructions    Allergies as of 08/21/2020   No Known Allergies     Medication List    STOP taking these medications   alum & mag hydroxide-simeth 200-200-20 MG/5ML suspension Commonly known as: MAALOX/MYLANTA   amLODipine 5 MG tablet Commonly known as: NORVASC   SAW PALMETTO PO     TAKE these medications   acetaminophen 325 MG tablet Commonly known as: TYLENOL Take 2 tablets (650 mg total) by mouth every 6 (six) hours as needed for moderate pain, fever or headache.   Boudreauxs Butt Paste 40 % ointment Generic drug: liver oil-zinc oxide Apply 1 application topically 2 (two) times daily at 8 am and 10 pm.   Centrum Silver tablet Take 1 tablet by mouth at bedtime.   cholestyramine light 4 g packet Commonly known as: PREVALITE Take 4 g by mouth 2 (two) times daily.    diphenoxylate-atropine 2.5-0.025 MG tablet Commonly known as: LOMOTIL Take 1 tablet by mouth 4 (four) times daily.   dorzolamide-timolol 22.3-6.8 MG/ML ophthalmic solution Commonly known as: COSOPT Place 1 drop into the left eye 2 (two) times daily.   finasteride 5 MG tablet Commonly known as: PROSCAR Take 5 mg by mouth at bedtime.   Gerhardt's butt cream Crea Apply 1 application topically 2 (two) times daily. Apply to sacrum and scrotum   loperamide 2 MG capsule Commonly known as: IMODIUM Take 1 capsule (2 mg total) by mouth 3 (three) times daily. What changed:   how much to take  Another medication with the same name was removed. Continue taking this medication, and follow the directions you see here.   octreotide 100 MCG/ML Soln injection Commonly known as: SANDOSTATIN Inject 1.5 mLs (150 mcg total) into the skin every 12 (twelve) hours.   ondansetron 4 MG tablet Commonly known as: ZOFRAN Take 1 tablet (4 mg total) by mouth every 6 (six) hours as needed for nausea.   oxyCODONE 5 MG immediate release tablet Commonly known as: Oxy IR/ROXICODONE Take 1 tablet (5 mg total) by mouth every 6 (six) hours as needed for moderate pain.   potassium chloride 10 MEQ CR capsule Commonly known as: MICRO-K Take 10 mEq by mouth 2 (two) times daily.   sodium bicarbonate 650 MG tablet Take 2 tablets (1,300 mg total) by mouth 3 (three) times daily.   tamsulosin 0.4 MG Caps capsule Commonly known as: FLOMAX Take 1 capsule (0.4 mg total) by mouth daily after supper.   Travoprost (BAK Free) 0.004 % Soln ophthalmic solution Commonly known as: TRAVATAN Place 1 drop into the left  eye at bedtime.      No Known Allergies  Follow-up Information    Nolene Ebbs, MD. Schedule an appointment as soon as possible for a visit in 1 week(s).   Specialty: Internal Medicine Contact information: [address] [place] [address] [phone]        Ladell Pier, MD Follow up  in 2 week(s).   Specialty: Oncology Contact information: [place] [address] [phone]                The results of significant diagnostics from this hospitalization (including imaging, microbiology, ancillary and laboratory) are listed below for reference.    Significant Diagnostic Studies: CT Head Wo Contrast  Result Date: 08/14/2020 CLINICAL DATA:  Delirium. EXAM: CT HEAD WITHOUT CONTRAST TECHNIQUE: Contiguous axial images were obtained from the base of the skull through the vertex without intravenous contrast. COMPARISON:  06/05/2020. FINDINGS: Brain: No evidence of acute large vascular territory infarction, hemorrhage, hydrocephalus, extra-axial collection or mass lesion/mass effect. Patchy white matter hypodensities, likely related to chronic microvascular ischemic disease. Age congruent cerebral volume loss. Vascular: Calcific atherosclerosis. Skull: No acute fracture. Sinuses/Orbits: Mild ethmoid air cell mucosal thickening. No air-fluid levels. Unremarkable orbits. Other: No mastoid effusions. IMPRESSION: 1. No evidence of acute intracranial abnormality. 2. Chronic microvascular ischemic disease. Electronically Signed   By: Margaretha Sheffield MD   On: 08/14/2020 14:49   CT Abdomen Pelvis W Contrast  Result Date: 08/14/2020 CLINICAL DATA:  Nausea vomiting, diarrhea and syncopal episode. History of neuroendocrine tumor. EXAM: CT ABDOMEN AND PELVIS WITH CONTRAST TECHNIQUE: Multidetector CT imaging of the abdomen and pelvis was performed using the standard protocol following bolus administration of intravenous contrast. CONTRAST:  130mL OMNIPAQUE IOHEXOL 300 MG/ML  SOLN COMPARISON:  Multiple prior studies including a CT from September 142021 and June 06, 2020 FINDINGS: Lower chest: Small RIGHT-sided pleural effusion. This is increased in size slightly since the prior study and is associated with RIGHT basilar airspace disease. Basilar atelectasis in  the LEFT chest. No pericardial effusion. Distal esophageal thickening similar to prior studies. Hepatobiliary: Diffuse infiltration of the liver with tumor, areas of calcification have developed over time, in the relatively short interval since June 06, 2020 there are innumerable areas of calcification and decreased size of discrete masslike areas in the liver since the study of June 05, 2020, for instance on image 34 of series 10 a lesion with peripheral calcification measuring approximately 16 mm previously 24 mm. There is still however extensive disease throughout the liver on the current exam. The portal vein is patent into the liver. Hepatic veins are patent. Pancreas: Pancreatic atrophy. No pancreatic ductal dilation or inflammation. Spleen: Spleen normal in size. Adrenals/Urinary Tract: Adrenal glands are normal. Kidneys without hydronephrosis. Stable LEFT renal cyst. Urinary bladder decompressed secondary to Foley catheter. Stomach/Bowel: Patulous distal esophagus. No small bowel dilation in the proximal small bowel, beginning in the mid small bowel in areas subtended by the mesentery involved by the central mesenteric mass there is mural stratification, and perienteric stranding that is similar to the study of June 06, 2020. No sign of pneumatosis. Mesenteric stranding peripheral to the mass in the central mesentery is similar. RIGHT colonic thickening is similar as well. Central mesenteric mass measuring approximately 3.5 x 2.4 cm previously as large as 3.8 cm but changes in mesenteric position could alter measurements in the axial plane, measurements in the coronal plane in the range of 5 cm are similar to the prior study. Vascular/Lymphatic:  Calcified atheromatous plaque scattered about the abdominal aorta. There is no gastrohepatic or hepatoduodenal ligament lymphadenopathy. No retroperitoneal or mesenteric lymphadenopathy. In case mint of mesenteric vasculature with similar appearance.  Venous collateral pathways in the central abdomen also similar. Reproductive: Heterogeneous prostate, nonspecific finding on CT. No pelvic sidewall lymphadenopathy. Other: Moderate volume ascites is slightly increased from the prior exam. Ascites was present but is increased in volume particularly in the pelvis. Musculoskeletal: No acute bone finding. No destructive bone process. IMPRESSION: 1. Increasing ascites with similar appearance of small bowel thickening and perienteric stranding seen on the previous study in the setting of central mesenteric mass that encases mesenteric vasculature. Correlation with lactate may be helpful to exclude the possibility of worsening and or developing ischemia of bowel given vascular encasement. 2. Diffuse infiltration of the liver, some of these areas actually appears smaller than on previous imaging or more confluent. Significance uncertain given the diffuse nature of disease seen on the current exam. 3. Small RIGHT-sided pleural effusion is increased in size slightly since the prior study and is associated with RIGHT basilar airspace disease. 4. Stable LEFT pleural based nodule. 5. Aortic atherosclerosis. Aortic Atherosclerosis (ICD10-I70.0). Electronically Signed   By: Zetta Bills M.D.   On: 08/14/2020 15:37   DG Chest Port 1 View  Result Date: 08/14/2020 CLINICAL DATA:  Sepsis EXAM: PORTABLE CHEST 1 VIEW COMPARISON:  06/21/2020 FINDINGS: Stable cardiomediastinal contours. Low lung volumes with persistent elevation of the right hemidiaphragm. Mild streaky bibasilar opacities. No appreciable pleural fluid collection. No pneumothorax. Degenerative changes of the shoulders. IMPRESSION: Low lung volumes with streaky bibasilar opacities, likely atelectasis. Electronically Signed   By: Davina Poke D.O.   On: 08/14/2020 11:07    Microbiology: Recent Results (from the past 240 hour(s))  Urine culture     Status: Abnormal   Collection Time: 08/14/20 10:30 AM    Specimen: In/Out Cath Urine  Result Value Ref Range Status   Specimen Description IN/OUT CATH URINE  Final   Special Requests   Final    NONE Performed at [facility], [address] [place], [place] [address]    Culture >=100,000 COLONIES/mL PSEUDOMONAS AERUGINOSA (A)  Final   Report Status 08/17/2020 FINAL  Final   Organism ID, Bacteria PSEUDOMONAS AERUGINOSA (A)  Final      Susceptibility   Pseudomonas aeruginosa - MIC*    CEFTAZIDIME <=1 SENSITIVE Sensitive     CIPROFLOXACIN <=0.25 SENSITIVE Sensitive     GENTAMICIN <=1 SENSITIVE Sensitive     IMIPENEM 2 SENSITIVE Sensitive     PIP/TAZO <=4 SENSITIVE Sensitive     CEFEPIME 2 SENSITIVE Sensitive     * >=100,000 COLONIES/mL PSEUDOMONAS AERUGINOSA  Blood culture (routine x 2)     Status: None   Collection Time: 08/14/20 11:03 AM   Specimen: BLOOD  Result Value Ref Range Status   Specimen Description BLOOD SITE NOT SPECIFIED  Final   Special Requests   Final    BOTTLES DRAWN AEROBIC AND ANAEROBIC Blood Culture results may not be optimal due to an inadequate volume of blood received in culture bottles   Culture   Final    NO GROWTH 5 DAYS Performed at [facility], [place] [address] [place], [place] [address]    Report Status 08/19/2020 FINAL  Final  Respiratory Panel by RT PCR (Flu A&B, Covid) - Nasopharyngeal Swab     Status: None   Collection Time: 08/14/20 11:58 AM   Specimen: Nasopharyngeal Swab; Nasopharyngeal(NP)  swabs in vial transport medium  Result Value Ref Range Status   SARS Coronavirus 2 by RT PCR NEGATIVE NEGATIVE Final    Comment: (NOTE) SARS-CoV-2 target nucleic acids are NOT DETECTED.  The SARS-CoV-2 RNA is generally detectable in upper respiratoy specimens during the acute phase of infection. The lowest concentration of SARS-CoV-2 viral copies this assay can detect is 131 copies/mL. A negative result does not preclude SARS-Cov-2 infection and should not be used as the sole basis for treatment  or other patient management decisions. A negative result may occur with  improper specimen collection/handling, submission of specimen other than nasopharyngeal swab, presence of viral mutation(s) within the areas targeted by this assay, and inadequate number of viral copies (<131 copies/mL). A negative result must be combined with clinical observations, patient history, and epidemiological information. The expected result is Negative.  Fact Sheet for Patients:  PinkCheek.be  Fact Sheet for Healthcare Providers:  GravelBags.it  This test is no t yet approved or cleared by the [place] FDA and  has been authorized for detection and/or diagnosis of SARS-CoV-2 by FDA under an Emergency Use Authorization (EUA). This EUA will remain  in effect (meaning this test can be used) for the duration of the COVID-19 declaration under Section 564(b)(1) of the Act, 21 U.S.C. section 360bbb-3(b)(1), unless the authorization is terminated or revoked sooner.     Influenza A by PCR NEGATIVE NEGATIVE Final   Influenza B by PCR NEGATIVE NEGATIVE Final    Comment: (NOTE) The Xpert Xpress SARS-CoV-2/FLU/RSV assay is intended as an aid in  the diagnosis of influenza from Nasopharyngeal swab specimens and  should not be used as a sole basis for treatment. Nasal washings and  aspirates are unacceptable for Xpert Xpress SARS-CoV-2/FLU/RSV  testing.  Fact Sheet for Patients: PinkCheek.be  Fact Sheet for Healthcare Providers: GravelBags.it  This test is not yet approved or cleared by the [place] FDA and  has been authorized for detection and/or diagnosis of SARS-CoV-2 by  FDA under an Emergency Use Authorization (EUA). This EUA will remain  in effect (meaning this test can be used) for the duration of the  Covid-19 declaration under Section 564(b)(1) of the Act, 21  U.S.C.  section 360bbb-3(b)(1), unless the authorization is  terminated or revoked. Performed at [facility], [place] [address], [place], [place] [address]   Blood culture (routine x 2)     Status: None   Collection Time: 08/14/20  9:34 PM   Specimen: BLOOD RIGHT HAND  Result Value Ref Range Status   Specimen Description BLOOD RIGHT HAND  Final   Special Requests   Final    BOTTLES DRAWN AEROBIC AND ANAEROBIC Blood Culture adequate volume   Culture   Final    NO GROWTH 5 DAYS Performed at [facility], [place] [address] [place], [place] [address]    Report Status 08/19/2020 FINAL  Final     Labs: Basic Metabolic Panel: Recent Labs  Lab 08/14/20 1149 08/15/20 0329 08/16/20 0158 08/17/20 0303 08/20/20 0850  NA 143 142 142 137 138  K 3.2* 4.4 3.9 3.7 3.8  CL  --  110 110 107 104  CO2  --  17* 21* 20* 23  GLUCOSE  --  98 119* 119* 121*  BUN  --  18 23 23 15   CREATININE  --  1.44* 1.57* 1.41* 1.18  CALCIUM  --  8.1* 7.8* 7.6* 8.0*   Liver Function Tests: Recent Labs  Lab 08/15/20 0329  AST 616*  ALT 30  ALKPHOS 166*  BILITOT 1.3*  PROT 6.1*  ALBUMIN 1.8*   No results for input(s): LIPASE, AMYLASE in the last 168 hours. Recent Labs  Lab 08/14/20 1228  AMMONIA 52*   CBC: Recent Labs  Lab 08/14/20 1149 08/15/20 0329 08/16/20 0158 08/17/20 0303 08/20/20 0850  WBC  --  8.8 6.6 6.9 3.7*  HGB 10.9* 10.3* 8.9* 8.5* 9.1*  HCT 32.0* 30.1* 26.3* 25.3* 27.4*  MCV  --  74.3* 74.5* 75.3* 75.3*  PLT  --  111* 110* 101* 113*   Cardiac Enzymes: No results for input(s): CKTOTAL, CKMB, CKMBINDEX, TROPONINI in the last 168 hours. BNP: BNP (last 3 results) Recent Labs    08/14/20 1020  BNP 82.4    ProBNP (last 3 results) No results for input(s): PROBNP in the last 8760 hours.  CBG: Recent Labs  Lab 08/14/20 2110  GLUCAP 98       Signed:  Domenic Polite MD.  [facility] 08/21/2020, 10:50 AM

## 2020-08-21 NOTE — Progress Notes (Signed)
Physical Therapy Treatment Patient Details Name: Charles Schmidt MRN: [id number] DOB: Apr 30, 1941 Today's Date: 08/21/2020    History of Present Illness The pt is a 79 yo male presenting from SNF due to syncopal episode with diarrhea in the shower. PMH includes: HTN, carcinoid tumor with multiple metastases to the liver.    PT Comments    Patient received in bed, cooperative with therapy but very particular and at times becoming slightly agitated when things were not "just so". Able to get to EOB with min guard/extended time, but needed Mod-MaxA for all functional transfers with RW today due to impulsivity and gross weakness. Spent quite a bit of time on the commode due to diarrhea as well as with pericare following diarrhea. We transferred back to bed then he self-selected laying on his side and firmly asked for soap and water enema- RN informed. Left in bed with all needs met, alarm active this afternoon.     Follow Up Recommendations  SNF;Supervision/Assistance - 24 hour     Equipment Recommendations  None recommended by PT    Recommendations for Other Services       Precautions / Restrictions Precautions Precautions: Fall Restrictions Weight Bearing Restrictions: No    Mobility  Bed Mobility Overal bed mobility: Needs Assistance Bed Mobility: Supine to Sit;Sit to Supine     Supine to sit: Min guard;HOB elevated Sit to supine: Min guard;HOB elevated   General bed mobility comments: increased time with HOB elevated and use of bed rail, refused physical assist from PT  Transfers Overall transfer level: Needs assistance Equipment used: Rolling walker (2 wheeled) Transfers: Sit to/from [facility] Sit to Stand: Mod assist;Max assist Stand pivot transfers: Mod assist       General transfer comment: ModA from elevated bed today, but needed heavy MaxA to power up from [facility]; cues for hand placement and sequencing; ModA for pivot to/from [facility] due to impulsivity  and trying to push RW out of the way  Ambulation/Gait             General Gait Details: refused   Stairs             Wheelchair Mobility    Modified Rankin (Stroke Patients Only)       Balance Overall balance assessment: Needs assistance Sitting-balance support: Single extremity supported Sitting balance-Leahy Scale: Fair   Postural control: Posterior lean   Standing balance-Leahy Scale: Poor Standing balance comment: reliance on BUE support                            Cognition Arousal/Alertness: Awake/alert Behavior During Therapy: Flat affect Overall Cognitive Status: Within Functional Limits for tasks assessed                                 General Comments: flat affect, able to verbalize needs, followed all commands but very particular and can become a bit agitated if things are not done exactly so      Exercises      General Comments        Pertinent Vitals/Pain Pain Assessment: Faces Faces Pain Scale: Hurts a little bit Pain Location: general Pain Descriptors / Indicators: Discomfort;Grimacing Pain Intervention(s): Limited activity within patient's tolerance;Monitored during session;Repositioned    Home Living  Prior Function            PT Goals (current goals can now be found in the care plan section) Acute Rehab PT Goals Patient Stated Goal: to go to bathroom PT Goal Formulation: With patient Time For Goal Achievement: 09/01/20 Potential to Achieve Goals: Good Progress towards PT goals: Progressing toward goals    Frequency    Min 2X/week      PT Plan Current plan remains appropriate    Co-evaluation              AM-PAC PT "6 Clicks" Mobility   Outcome Measure  Help needed turning from your back to your side while in a flat bed without using bedrails?: A Little Help needed moving from lying on your back to sitting on the side of a flat bed without using  bedrails?: A Little Help needed moving to and from a bed to a chair (including a wheelchair)?: A Lot Help needed standing up from a chair using your arms (e.g., wheelchair or bedside chair)?: A Lot Help needed to walk in hospital room?: A Lot Help needed climbing 3-5 steps with a railing? : A Lot 6 Click Score: 14    End of Session Equipment Utilized During Treatment: Gait belt Activity Tolerance: Patient tolerated treatment well;Patient limited by fatigue Patient left: in bed;with call bell/phone within reach;with bed alarm set Nurse Communication: Mobility status;Other (comment) (patient requesting soap/water enema) PT Visit Diagnosis: Other abnormalities of gait and mobility (R26.89);Muscle weakness (generalized) (M62.81)     Time: 8102-5486 PT Time Calculation (min) (ACUTE ONLY): 21 min  Charges:  $Therapeutic Activity: 8-22 mins                     Windell Norfolk, DPT, PN1   Supplemental Physical Therapist [place]    Pager [phone] Acute Rehab Office [phone]

## 2020-08-22 DIAGNOSIS — R5381 Other malaise: Secondary | ICD-10-CM | POA: Diagnosis not present

## 2020-08-22 DIAGNOSIS — N320 Bladder-neck obstruction: Secondary | ICD-10-CM | POA: Diagnosis not present

## 2020-08-22 DIAGNOSIS — C7800 Secondary malignant neoplasm of unspecified lung: Secondary | ICD-10-CM | POA: Diagnosis not present

## 2020-08-22 DIAGNOSIS — K529 Noninfective gastroenteritis and colitis, unspecified: Secondary | ICD-10-CM | POA: Diagnosis not present

## 2020-08-22 DIAGNOSIS — R110 Nausea: Secondary | ICD-10-CM | POA: Diagnosis not present

## 2020-08-22 DIAGNOSIS — Z87440 Personal history of urinary (tract) infections: Secondary | ICD-10-CM | POA: Diagnosis not present

## 2020-08-22 DIAGNOSIS — D61818 Other pancytopenia: Secondary | ICD-10-CM | POA: Diagnosis not present

## 2020-08-22 DIAGNOSIS — R197 Diarrhea, unspecified: Secondary | ICD-10-CM | POA: Diagnosis not present

## 2020-08-22 DIAGNOSIS — M6281 Muscle weakness (generalized): Secondary | ICD-10-CM | POA: Diagnosis not present

## 2020-08-22 DIAGNOSIS — E876 Hypokalemia: Secondary | ICD-10-CM | POA: Diagnosis not present

## 2020-08-22 DIAGNOSIS — E340 Carcinoid syndrome: Secondary | ICD-10-CM | POA: Diagnosis not present

## 2020-08-22 DIAGNOSIS — Z79899 Other long term (current) drug therapy: Secondary | ICD-10-CM | POA: Diagnosis not present

## 2020-08-22 DIAGNOSIS — R531 Weakness: Secondary | ICD-10-CM | POA: Diagnosis not present

## 2020-08-22 DIAGNOSIS — I10 Essential (primary) hypertension: Secondary | ICD-10-CM | POA: Diagnosis not present

## 2020-08-22 DIAGNOSIS — H409 Unspecified glaucoma: Secondary | ICD-10-CM | POA: Diagnosis not present

## 2020-08-22 DIAGNOSIS — C7A019 Malignant carcinoid tumor of the small intestine, unspecified portion: Secondary | ICD-10-CM | POA: Diagnosis present

## 2020-08-22 DIAGNOSIS — R109 Unspecified abdominal pain: Secondary | ICD-10-CM | POA: Diagnosis not present

## 2020-08-22 DIAGNOSIS — R7881 Bacteremia: Secondary | ICD-10-CM | POA: Diagnosis not present

## 2020-08-22 DIAGNOSIS — N183 Chronic kidney disease, stage 3 unspecified: Secondary | ICD-10-CM | POA: Diagnosis not present

## 2020-08-22 DIAGNOSIS — C7B02 Secondary carcinoid tumors of liver: Secondary | ICD-10-CM | POA: Diagnosis not present

## 2020-08-22 DIAGNOSIS — Z7401 Bed confinement status: Secondary | ICD-10-CM | POA: Diagnosis not present

## 2020-08-22 DIAGNOSIS — M2550 Pain in unspecified joint: Secondary | ICD-10-CM | POA: Diagnosis not present

## 2020-08-22 DIAGNOSIS — N401 Enlarged prostate with lower urinary tract symptoms: Secondary | ICD-10-CM | POA: Diagnosis not present

## 2020-08-22 DIAGNOSIS — G9341 Metabolic encephalopathy: Secondary | ICD-10-CM | POA: Diagnosis not present

## 2020-08-22 DIAGNOSIS — R21 Rash and other nonspecific skin eruption: Secondary | ICD-10-CM | POA: Diagnosis not present

## 2020-08-22 DIAGNOSIS — D649 Anemia, unspecified: Secondary | ICD-10-CM | POA: Diagnosis not present

## 2020-08-22 DIAGNOSIS — R718 Other abnormality of red blood cells: Secondary | ICD-10-CM | POA: Diagnosis not present

## 2020-08-22 DIAGNOSIS — N400 Enlarged prostate without lower urinary tract symptoms: Secondary | ICD-10-CM | POA: Diagnosis not present

## 2020-08-22 DIAGNOSIS — R630 Anorexia: Secondary | ICD-10-CM | POA: Diagnosis not present

## 2020-08-22 DIAGNOSIS — C7A8 Other malignant neuroendocrine tumors: Secondary | ICD-10-CM | POA: Diagnosis not present

## 2020-08-22 DIAGNOSIS — B9620 Unspecified Escherichia coli [E. coli] as the cause of diseases classified elsewhere: Secondary | ICD-10-CM | POA: Diagnosis not present

## 2020-08-22 DIAGNOSIS — K7460 Unspecified cirrhosis of liver: Secondary | ICD-10-CM | POA: Diagnosis not present

## 2020-08-22 DIAGNOSIS — N179 Acute kidney failure, unspecified: Secondary | ICD-10-CM | POA: Diagnosis not present

## 2020-08-22 DIAGNOSIS — E43 Unspecified severe protein-calorie malnutrition: Secondary | ICD-10-CM | POA: Diagnosis not present

## 2020-08-22 DIAGNOSIS — I959 Hypotension, unspecified: Secondary | ICD-10-CM | POA: Diagnosis not present

## 2020-08-22 DIAGNOSIS — K639 Disease of intestine, unspecified: Secondary | ICD-10-CM | POA: Diagnosis not present

## 2020-08-22 DIAGNOSIS — C7B8 Other secondary neuroendocrine tumors: Secondary | ICD-10-CM | POA: Diagnosis not present

## 2020-08-22 LAB — SARS CORONAVIRUS 2 BY RT PCR (HOSPITAL ORDER, PERFORMED IN ~~LOC~~ HOSPITAL LAB): SARS Coronavirus 2: NEGATIVE

## 2020-08-22 NOTE — Plan of Care (Signed)
°  Problem: Coping: °Goal: Level of anxiety will decrease °Outcome: Progressing °  °

## 2020-08-22 NOTE — Discharge Summary (Signed)
Physician Discharge Summary  Charles Schmidt XBM:[id number] DOB: 09-03-1941 DOA: 08/14/2020  PCP: Nolene Ebbs, MD  Admit date: 08/14/2020 Discharge date: 08/22/2020  Time spent: 35 minutes  Recommendations for Outpatient Follow-up:  1. PCP in 1 week 2. Oncology Dr. Benay Spice in 2 weeks 3. Palliative care follow-up at SNF 4. Continue ongoing goals of care and CODE STATUS discussions with patient and his [name] 5. 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)   Bowel wall thickening   Nausea and vomiting in adult   Goals of care, counseling/discussion   Syncope   CKD (chronic kidney disease) stage 3, GFR 30-59 ml/min (HCC)   Intractable vomiting   Diarrhea   Carcinoid syndrome ([place])   Protein-calorie malnutrition, severe   Discharge Condition: Stable  Diet recommendation: Low-sodium  Filed Weights   08/18/20 0537 08/20/20 2048 08/21/20 2101  Weight: 94.8 kg 96.6 kg 97.2 kg    History of present illness:  79/M with widely metastatic carcinoid tumor, BPH, hypertension had a long hospital stay recently notable for bacteremia, diarrhea, failure to thrive, hospice was considered at the time however given clinical improvement he was discharged to rehab. -Sent to the ED from rehab with syncope, vomiting and hypotension.  When EMS arrived systolic blood pressure was in the 50s, he was given IV fluids. -On discussion patient requested hospice and wanted the pneumonia more more arousable of getting to  the [place] to let him pass away. -Vital signs was subsequently stable, labs noted hypokalemia, mild AKI, mildly elevated lactic acidosis, CT abdomen pelvis noted increase ascites, liver mets, small bowel thickening and perinephric stranding  Hospital Course:   Hypotension Hypovolemia Chronic diarrhea -Sepsis ruled out, no fever, leukocytosis tachycardia or tachypnea -secondary to ongoing GI losses, chronic diarrhea and intermittent vomiting -Hydrated with saline this admission, blood pressure improved and stable now, lactic acidosis has improved -Diarrhea has improved, continue Questran, Lomotil,  Imodium and octreotide -Blood pressure stable now, fluids discontinued  Metastatic carcinoid tumor with central mesenteric mass, extensive liver metastasis, malignant ascites Palliative care encounter -Patient is debilitated by advanced metastatic carcinoid tumor with extensive liver mets, worsening malignant ascites, albumin of 1.8 with third spacing, ongoing failure to thrive, and chronic diarrhea  -In my opinion his prognosis is quite poor, called and updated patients daughter regarding this and that hospice maybe most appropriate -Palliative care consulted and following -Restarted Lomotil, Imodium, Questran and octreotide with improvement in his diarrhea, his oral intake is a little better -Appreciate Dr. Gearldine Shown input, felt to have poor prognosis given advanced age carcinoid and multiple comorbidities however could potentially live longer if symptoms are controlled -Followed closely by palliative care team this admission, status post multiple family meetings, Patient at this time under the directive of his son has reversed his CODE STATUS from DNR to full code, plan for SNF at discharge with palliative care follow-up, and patient mentioned his plan was to transition to home hospice services after rehab -Will need ongoing palliative care discussions with patient and his [name]  Pseudomonas  UTI versus colonization Urinary retention/BPH with chronic Foley -Foley catheter  placed during previous hospitalization in October  -oncology concerned Pseudomonas in urine culture is a true infection, recommended antibiotics, treated with IV cefepime, changed Foley catheter 11/28, completed 4 days of cefepime, discontinued antibiotics yesterday  AKI on chronic kidney disease stage II -Baseline creatinine around 1.2,  -Stable  Severe protein calorie malnutrition 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  Covid test was also ordered but patient continued to refuse that knowing that not having Covid test is a hurdle for discharge and this was one of his excuse so he can delay the discharge.  I had a long discussion with patient and patient's daughter at the bedside along with patient's primary nurse.  He was still adamant this morning and finally had a long discussion with [facility] personnel and he is now agreeable to go.  He is a stable compared to yesterday.  He is being discharged today.  Discharge Exam: Vitals:   08/22/20 0433 08/22/20 0953  BP: 131/80 134/81  Pulse: 90 89  Resp: 20 17  Temp: 97.8 F (36.6 C) 97.9 F (36.6 C)  SpO2: 96% 100%   General exam: Appears calm and comfortable  Respiratory system: Clear to auscultation. Respiratory effort normal. Cardiovascular system: S1 & S2 heard, RRR. No JVD, murmurs, rubs, gallops or clicks. No pedal edema. Gastrointestinal system: Abdomen is nondistended, soft and nontender. No organomegaly or masses  felt. Normal bowel sounds heard. Central nervous system: Alert and oriented. No focal neurological deficits. Extremities: Symmetric 5 x 5 power. Skin: No rashes, lesions or ulcers.  Psychiatry: Judgement and insight appear normal. Mood & affect appropriate.   Discharge Instructions    Allergies as of 08/22/2020   No Known Allergies     Medication List    STOP taking these medications   alum & mag hydroxide-simeth 200-200-20 MG/5ML suspension Commonly known as: MAALOX/MYLANTA   amLODipine 5 MG tablet Commonly known as: NORVASC   SAW PALMETTO PO     TAKE these medications   acetaminophen 325 MG tablet Commonly known as: TYLENOL Take 2 tablets (650 mg total) by mouth every 6 (six) hours as needed for moderate pain, fever or headache.   Boudreauxs Butt Paste 40 % ointment Generic drug: liver oil-zinc oxide Apply 1 application topically 2 (two) times daily at 8 am and 10 pm.   Centrum Silver tablet Take 1 tablet by mouth at bedtime.   cholestyramine light 4 g packet Commonly known as: PREVALITE Take 4 g by mouth 2 (two) times daily.   diphenoxylate-atropine 2.5-0.025 MG tablet Commonly known as: LOMOTIL Take 1 tablet by mouth 4 (four) times daily.   dorzolamide-timolol 22.3-6.8 MG/ML ophthalmic solution Commonly known as: COSOPT Place 1 drop into the left eye 2 (two) times daily.   finasteride 5 MG tablet Commonly known as: PROSCAR Take 5 mg by mouth at bedtime.   Gerhardt's butt cream Crea Apply 1 application topically 2 (two) times daily. Apply to sacrum and scrotum   loperamide 2 MG capsule Commonly known as: IMODIUM Take 1 capsule (2 mg total) by mouth 3 (three) times daily. What changed:   how much to take  Another medication with the same name was removed. Continue taking this medication, and follow the directions you see here.   octreotide 100 MCG/ML Soln injection Commonly known as: SANDOSTATIN Inject 1.5 mLs (150 mcg total) into the skin every 12  (twelve) hours.   ondansetron 4 MG tablet Commonly known as: ZOFRAN Take 1 tablet (4 mg total) by mouth every 6 (six) hours as needed for nausea.   oxyCODONE 5 MG immediate release tablet Commonly known as: Oxy IR/ROXICODONE Take 1 tablet (5 mg total) by mouth every 6 (six) hours as needed for moderate pain.   potassium chloride 10 MEQ CR capsule Commonly known as: MICRO-K Take 10 mEq by mouth 2 (two) times daily.   sodium bicarbonate 650 MG tablet Take 2 tablets (1,300 mg total) by mouth 3 (three) times daily.   tamsulosin 0.4 MG Caps capsule Commonly known as: FLOMAX Take 1 capsule (0.4 mg total) by mouth daily after supper.   Travoprost (BAK Free) 0.004 % Soln ophthalmic solution Commonly known as: TRAVATAN Place 1 drop into the left eye at bedtime.      No Known Allergies  Contact information for follow-up providers    Nolene Ebbs, MD. Schedule an appointment as soon as possible for a visit in 1 week(s).   Specialty: Internal Medicine Contact information: [address] [place] [address] [phone]        Ladell Pier, MD Follow up in 2 week(s).   Specialty: Oncology Contact information: [place] [address] [phone]            Contact information for after-discharge care    Destination    [facility] .   Service: Skilled Nursing Contact information: [address] [place] [address] [place] [phone]                   The results of significant diagnostics from this hospitalization (including imaging, microbiology, ancillary and laboratory) are listed below for reference.    Significant Diagnostic Studies: CT Head Wo Contrast  Result Date: 08/14/2020 CLINICAL DATA:  Delirium. EXAM: CT HEAD WITHOUT CONTRAST TECHNIQUE: Contiguous axial images were obtained from the base of the skull through the vertex without intravenous contrast.  COMPARISON:  06/05/2020. FINDINGS: Brain: No evidence of acute large vascular territory infarction, hemorrhage, hydrocephalus, extra-axial collection or mass lesion/mass effect. Patchy white matter hypodensities, likely related to chronic microvascular ischemic disease. Age congruent cerebral volume loss. Vascular: Calcific atherosclerosis. Skull: No acute fracture. Sinuses/Orbits: Mild ethmoid air cell mucosal thickening. No air-fluid levels. Unremarkable orbits. Other: No mastoid effusions. IMPRESSION: 1. No evidence of acute intracranial abnormality. 2. Chronic microvascular ischemic disease. Electronically Signed   By: Margaretha Sheffield MD   On: 08/14/2020 14:49   CT Abdomen Pelvis W Contrast  Result Date: 08/14/2020 CLINICAL DATA:  Nausea vomiting, diarrhea and syncopal episode. History of neuroendocrine tumor. EXAM: CT ABDOMEN AND PELVIS WITH CONTRAST TECHNIQUE: Multidetector CT imaging of the abdomen and pelvis was performed using the standard protocol following bolus administration of intravenous contrast. CONTRAST:  147mL OMNIPAQUE IOHEXOL 300 MG/ML  SOLN COMPARISON:  Multiple prior studies including a CT from September 142021 and June 06, 2020 FINDINGS: Lower chest: Small RIGHT-sided pleural effusion. This is increased in size slightly since the prior study and is associated with RIGHT basilar airspace disease. Basilar atelectasis in the LEFT chest. No pericardial effusion. Distal esophageal thickening similar to prior studies. Hepatobiliary: Diffuse infiltration of the liver with  tumor, areas of calcification have developed over time, in the relatively short interval since June 06, 2020 there are innumerable areas of calcification and decreased size of discrete masslike areas in the liver since the study of June 05, 2020, for instance on image 34 of series 10 a lesion with peripheral calcification measuring approximately 16 mm previously 24 mm. There is still however extensive disease  throughout the liver on the current exam. The portal vein is patent into the liver. Hepatic veins are patent. Pancreas: Pancreatic atrophy. No pancreatic ductal dilation or inflammation. Spleen: Spleen normal in size. Adrenals/Urinary Tract: Adrenal glands are normal. Kidneys without hydronephrosis. Stable LEFT renal cyst. Urinary bladder decompressed secondary to Foley catheter. Stomach/Bowel: Patulous distal esophagus. No small bowel dilation in the proximal small bowel, beginning in the mid small bowel in areas subtended by the mesentery involved by the central mesenteric mass there is mural stratification, and perienteric stranding that is similar to the study of June 06, 2020. No sign of pneumatosis. Mesenteric stranding peripheral to the mass in the central mesentery is similar. RIGHT colonic thickening is similar as well. Central mesenteric mass measuring approximately 3.5 x 2.4 cm previously as large as 3.8 cm but changes in mesenteric position could alter measurements in the axial plane, measurements in the coronal plane in the range of 5 cm are similar to the prior study. Vascular/Lymphatic: Calcified atheromatous plaque scattered about the abdominal aorta. There is no gastrohepatic or hepatoduodenal ligament lymphadenopathy. No retroperitoneal or mesenteric lymphadenopathy. In case mint of mesenteric vasculature with similar appearance. Venous collateral pathways in the central abdomen also similar. Reproductive: Heterogeneous prostate, nonspecific finding on CT. No pelvic sidewall lymphadenopathy. Other: Moderate volume ascites is slightly increased from the prior exam. Ascites was present but is increased in volume particularly in the pelvis. Musculoskeletal: No acute bone finding. No destructive bone process. IMPRESSION: 1. Increasing ascites with similar appearance of small bowel thickening and perienteric stranding seen on the previous study in the setting of central mesenteric mass that encases  mesenteric vasculature. Correlation with lactate may be helpful to exclude the possibility of worsening and or developing ischemia of bowel given vascular encasement. 2. Diffuse infiltration of the liver, some of these areas actually appears smaller than on previous imaging or more confluent. Significance uncertain given the diffuse nature of disease seen on the current exam. 3. Small RIGHT-sided pleural effusion is increased in size slightly since the prior study and is associated with RIGHT basilar airspace disease. 4. Stable LEFT pleural based nodule. 5. Aortic atherosclerosis. Aortic Atherosclerosis (ICD10-I70.0). Electronically Signed   By: Zetta Bills M.D.   On: 08/14/2020 15:37   DG Chest Port 1 View  Result Date: 08/14/2020 CLINICAL DATA:  Sepsis EXAM: PORTABLE CHEST 1 VIEW COMPARISON:  06/21/2020 FINDINGS: Stable cardiomediastinal contours. Low lung volumes with persistent elevation of the right hemidiaphragm. Mild streaky bibasilar opacities. No appreciable pleural fluid collection. No pneumothorax. Degenerative changes of the shoulders. IMPRESSION: Low lung volumes with streaky bibasilar opacities, likely atelectasis. Electronically Signed   By: Davina Poke D.O.   On: 08/14/2020 11:07    Microbiology: Recent Results (from the past 240 hour(s))  Urine culture     Status: Abnormal   Collection Time: 08/14/20 10:30 AM   Specimen: In/Out Cath Urine  Result Value Ref Range Status   Specimen Description IN/OUT CATH URINE  Final   Special Requests   Final    NONE Performed at [facility], [address] [place], [place] [address]  Culture >=100,000 COLONIES/mL PSEUDOMONAS AERUGINOSA (A)  Final   Report Status 08/17/2020 FINAL  Final   Organism ID, Bacteria PSEUDOMONAS AERUGINOSA (A)  Final      Susceptibility   Pseudomonas aeruginosa - MIC*    CEFTAZIDIME <=1 SENSITIVE Sensitive     CIPROFLOXACIN <=0.25 SENSITIVE Sensitive     GENTAMICIN <=1 SENSITIVE Sensitive      IMIPENEM 2 SENSITIVE Sensitive     PIP/TAZO <=4 SENSITIVE Sensitive     CEFEPIME 2 SENSITIVE Sensitive     * >=100,000 COLONIES/mL PSEUDOMONAS AERUGINOSA  Blood culture (routine x 2)     Status: None   Collection Time: 08/14/20 11:03 AM   Specimen: BLOOD  Result Value Ref Range Status   Specimen Description BLOOD SITE NOT SPECIFIED  Final   Special Requests   Final    BOTTLES DRAWN AEROBIC AND ANAEROBIC Blood Culture results may not be optimal due to an inadequate volume of blood received in culture bottles   Culture   Final    NO GROWTH 5 DAYS Performed at [facility], [place] [address], [place], [place] [address]    Report Status 08/19/2020 FINAL  Final  Respiratory Panel by RT PCR (Flu A&B, Covid) - Nasopharyngeal Swab     Status: None   Collection Time: 08/14/20 11:58 AM   Specimen: Nasopharyngeal Swab; Nasopharyngeal(NP) swabs in vial transport medium  Result Value Ref Range Status   SARS Coronavirus 2 by RT PCR NEGATIVE NEGATIVE Final    Comment: (NOTE) SARS-CoV-2 target nucleic acids are NOT DETECTED.  The SARS-CoV-2 RNA is generally detectable in upper respiratoy specimens during the acute phase of infection. The lowest concentration of SARS-CoV-2 viral copies this assay can detect is 131 copies/mL. A negative result does not preclude SARS-Cov-2 infection and should not be used as the sole basis for treatment or other patient management decisions. A negative result may occur with  improper specimen collection/handling, submission of specimen other than nasopharyngeal swab, presence of viral mutation(s) within the areas targeted by this assay, and inadequate number of viral copies (<131 copies/mL). A negative result must be combined with clinical observations, patient history, and epidemiological information. The expected result is Negative.  Fact Sheet for Patients:  PinkCheek.be  Fact Sheet for Healthcare Providers:   GravelBags.it  This test is no t yet approved or cleared by the [place] FDA and  has been authorized for detection and/or diagnosis of SARS-CoV-2 by FDA under an Emergency Use Authorization (EUA). This EUA will remain  in effect (meaning this test can be used) for the duration of the COVID-19 declaration under Section 564(b)(1) of the Act, 21 U.S.C. section 360bbb-3(b)(1), unless the authorization is terminated or revoked sooner.     Influenza A by PCR NEGATIVE NEGATIVE Final   Influenza B by PCR NEGATIVE NEGATIVE Final    Comment: (NOTE) The Xpert Xpress SARS-CoV-2/FLU/RSV assay is intended as an aid in  the diagnosis of influenza from Nasopharyngeal swab specimens and  should not be used as a sole basis for treatment. Nasal washings and  aspirates are unacceptable for Xpert Xpress SARS-CoV-2/FLU/RSV  testing.  Fact Sheet for Patients: PinkCheek.be  Fact Sheet for Healthcare Providers: GravelBags.it  This test is not yet approved or cleared by the [place] FDA and  has been authorized for detection and/or diagnosis of SARS-CoV-2 by  FDA under an Emergency Use Authorization (EUA). This EUA will remain  in effect (meaning this test can be used) for the duration of the  Covid-19 declaration under Section 564(b)(1) of the Act, 21  U.S.C. section 360bbb-3(b)(1), unless the authorization is  terminated or revoked. Performed at [facility], [place] [address] [place], [place] [address]   Blood culture (routine x 2)     Status: None   Collection Time: 08/14/20  9:34 PM   Specimen: BLOOD RIGHT HAND  Result Value Ref Range Status   Specimen Description BLOOD RIGHT HAND  Final   Special Requests   Final    BOTTLES DRAWN AEROBIC AND ANAEROBIC Blood Culture adequate volume   Culture   Final    NO GROWTH 5 DAYS Performed at [facility], [place] [address] [place], [place]  [address]    Report Status 08/19/2020 FINAL  Final     Labs: Basic Metabolic Panel: Recent Labs  Lab 08/16/20 0158 08/17/20 0303 08/20/20 0850  NA 142 137 138  K 3.9 3.7 3.8  CL 110 107 104  CO2 21* 20* 23  GLUCOSE 119* 119* 121*  BUN 23 23 15   CREATININE 1.57* 1.41* 1.18  CALCIUM 7.8* 7.6* 8.0*   Liver Function Tests: No results for input(s): AST, ALT, ALKPHOS, BILITOT, PROT, ALBUMIN in the last 168 hours. No results for input(s): LIPASE, AMYLASE in the last 168 hours. No results for input(s): AMMONIA in the last 168 hours. CBC: Recent Labs  Lab 08/16/20 0158 08/17/20 0303 08/20/20 0850  WBC 6.6 6.9 3.7*  HGB 8.9* 8.5* 9.1*  HCT 26.3* 25.3* 27.4*  MCV 74.5* 75.3* 75.3*  PLT 110* 101* 113*   Cardiac Enzymes: No results for input(s): CKTOTAL, CKMB, CKMBINDEX, TROPONINI in the last 168 hours. BNP: BNP (last 3 results) Recent Labs    08/14/20 1020  BNP 82.4    ProBNP (last 3 results) No results for input(s): PROBNP in the last 8760 hours.  CBG: No results for input(s): GLUCAP in the last 168 hours.     Signed:  Darliss Cheney MD.  [facility] 08/22/2020, 11:45 AM

## 2020-08-22 NOTE — Progress Notes (Signed)
Pt discharging to SNF, [facility].   At 1515 Report called to nurse Benjamine Mola at [facility]. Pt will be returning to facility and will go to room 123.   Waiting for [facility] transportation to arrive to pick up pt.  Pt's daughter at bedside.

## 2020-08-22 NOTE — TOC Transition Note (Signed)
Transition of Care [facility]) - CM/SW Discharge Note *Discharged to [facility]   Patient Details  Name: Jashawn Floyd MRN: [id number] Date of Birth: Feb 05, 1941  Transition of Care [facility]) CM/SW Contact:  Sable Feil, LCSW Phone Number: 08/22/2020, 2:46 PM   Clinical Narrative:  Patient medically stable for discharge and going to [facility] for [place] rehab. Patient and daughter Reita Chard were aware of today's discharge from MD and were kept informed regarding discharge to facility today, and was informed once transport was arranged.  Admissions liaison Harriet Pho contacted regarding discharge and discharge clinicals transmitted to facility.     Final next level of care: [facility]) Barriers to Discharge: No Barriers Identified   Patient Goals and CMS Choice Patient states their goals for this hospitalization and ongoing recovery are:: Patient and family agreeable to [facility] rehab for strengthening before returning home CMS Medicare.gov Compare Post Acute Care list provided to:: Patient Represenative (must comment) Choice offered to / list presented to : Adult Children  Discharge Placement   Existing PASRR number confirmed : 08/18/20          Patient chooses bed at: [facility] Patient to be transferred to facility by: Non-emergency ambulance transport Name of family member notified: Daughter Reita Chard at the bedside Patient and family notified of of transfer: 08/22/20  Discharge Plan and Services                                    Social Determinants of Health (SDOH) Interventions  No SDOH interventions requested or needed at discharge.   Readmission Risk Interventions No flowsheet data found.

## 2020-08-22 NOTE — Progress Notes (Signed)
DISCHARGE NOTE SNF [facility] to be discharged [facility] per MD order. Patient verbalized understanding.  Skin clean, dry and intact without evidence of skin break down, no evidence of skin tears noted. IV catheter discontinued intact. Site without signs and symptoms of complications. Dressing and pressure applied. Pt denies pain at the site currently. No complaints noted.  Patient free of lines, drains, and wounds.   Discharge packet assembled. An After Visit Summary (AVS) was printed and given to the EMS personnel. Patient escorted via stretcher and discharged to [facility] via ambulance. Report called to accepting facility; all questions and concerns addressed.   Rushie Goltz, RN

## 2020-08-22 NOTE — Progress Notes (Signed)
[facility] contacted, pt is currently 4th on the list for pick up. Will inform pt and daughter at bedside.

## 2020-08-23 DIAGNOSIS — E43 Unspecified severe protein-calorie malnutrition: Secondary | ICD-10-CM | POA: Diagnosis not present

## 2020-08-23 DIAGNOSIS — R531 Weakness: Secondary | ICD-10-CM | POA: Diagnosis not present

## 2020-08-23 DIAGNOSIS — C7800 Secondary malignant neoplasm of unspecified lung: Secondary | ICD-10-CM | POA: Diagnosis not present

## 2020-08-23 DIAGNOSIS — R197 Diarrhea, unspecified: Secondary | ICD-10-CM | POA: Diagnosis not present

## 2020-08-29 ENCOUNTER — Other Ambulatory Visit: Payer: Self-pay | Admitting: *Deleted

## 2020-08-29 NOTE — Patient Outreach (Signed)
[facility] Post- Acute Care Coordinator follow up. Member screened for potential [facility] Care Management needs as a benefit of [place] Medicare.  Communication sent to [facility] SNF SW to request update on transition plans.   Will plan outreach to family as appropriate.    Marthenia Rolling, MSN, RN,BSN [place] Acute Care Coordinator [phone] [facility]) [phone]  (Toll free office)

## 2020-08-31 ENCOUNTER — Inpatient Hospital Stay: Payer: No Typology Code available for payment source

## 2020-08-31 ENCOUNTER — Inpatient Hospital Stay: Payer: No Typology Code available for payment source | Attending: Oncology

## 2020-08-31 ENCOUNTER — Inpatient Hospital Stay (HOSPITAL_BASED_OUTPATIENT_CLINIC_OR_DEPARTMENT_OTHER): Payer: No Typology Code available for payment source | Admitting: Oncology

## 2020-08-31 ENCOUNTER — Other Ambulatory Visit: Payer: Self-pay

## 2020-08-31 VITALS — BP 154/97 | HR 83 | Temp 97.2°F | Resp 17 | Ht 76.0 in | Wt 202.8 lb

## 2020-08-31 DIAGNOSIS — C7B8 Other secondary neuroendocrine tumors: Secondary | ICD-10-CM

## 2020-08-31 DIAGNOSIS — C7B02 Secondary carcinoid tumors of liver: Secondary | ICD-10-CM | POA: Diagnosis not present

## 2020-08-31 DIAGNOSIS — K7460 Unspecified cirrhosis of liver: Secondary | ICD-10-CM | POA: Insufficient documentation

## 2020-08-31 DIAGNOSIS — R21 Rash and other nonspecific skin eruption: Secondary | ICD-10-CM | POA: Diagnosis not present

## 2020-08-31 DIAGNOSIS — Z79899 Other long term (current) drug therapy: Secondary | ICD-10-CM | POA: Diagnosis not present

## 2020-08-31 DIAGNOSIS — Z87440 Personal history of urinary (tract) infections: Secondary | ICD-10-CM | POA: Insufficient documentation

## 2020-08-31 DIAGNOSIS — D61818 Other pancytopenia: Secondary | ICD-10-CM | POA: Diagnosis not present

## 2020-08-31 DIAGNOSIS — R7881 Bacteremia: Secondary | ICD-10-CM | POA: Insufficient documentation

## 2020-08-31 DIAGNOSIS — N400 Enlarged prostate without lower urinary tract symptoms: Secondary | ICD-10-CM | POA: Diagnosis not present

## 2020-08-31 DIAGNOSIS — N179 Acute kidney failure, unspecified: Secondary | ICD-10-CM | POA: Insufficient documentation

## 2020-08-31 DIAGNOSIS — E340 Carcinoid syndrome: Secondary | ICD-10-CM | POA: Diagnosis not present

## 2020-08-31 DIAGNOSIS — D649 Anemia, unspecified: Secondary | ICD-10-CM | POA: Insufficient documentation

## 2020-08-31 DIAGNOSIS — B9620 Unspecified Escherichia coli [E. coli] as the cause of diseases classified elsewhere: Secondary | ICD-10-CM | POA: Diagnosis not present

## 2020-08-31 DIAGNOSIS — I959 Hypotension, unspecified: Secondary | ICD-10-CM | POA: Diagnosis not present

## 2020-08-31 DIAGNOSIS — R110 Nausea: Secondary | ICD-10-CM | POA: Diagnosis not present

## 2020-08-31 DIAGNOSIS — C7A019 Malignant carcinoid tumor of the small intestine, unspecified portion: Secondary | ICD-10-CM | POA: Insufficient documentation

## 2020-08-31 DIAGNOSIS — R718 Other abnormality of red blood cells: Secondary | ICD-10-CM | POA: Insufficient documentation

## 2020-08-31 DIAGNOSIS — R197 Diarrhea, unspecified: Secondary | ICD-10-CM | POA: Insufficient documentation

## 2020-08-31 LAB — CMP (CANCER CENTER ONLY)
ALT: 14 U/L (ref 0–44)
AST: 28 U/L (ref 15–41)
Albumin: 2.1 g/dL — ABNORMAL LOW (ref 3.5–5.0)
Alkaline Phosphatase: 248 U/L — ABNORMAL HIGH (ref 38–126)
Anion gap: 8 (ref 5–15)
BUN: 11 mg/dL (ref 8–23)
CO2: 27 mmol/L (ref 22–32)
Calcium: 8 mg/dL — ABNORMAL LOW (ref 8.9–10.3)
Chloride: 104 mmol/L (ref 98–111)
Creatinine: 0.99 mg/dL (ref 0.61–1.24)
GFR, Estimated: >60 mL/min (ref >60)
Glucose, Bld: 92 mg/dL (ref 70–99)
Potassium: 4 mmol/L (ref 3.5–5.1)
Sodium: 139 mmol/L (ref 135–145)
Total Bilirubin: 0.5 mg/dL (ref 0.3–1.2)
Total Protein: 6.6 g/dL (ref 6.5–8.1)

## 2020-08-31 LAB — CBC WITH DIFFERENTIAL (CANCER CENTER ONLY)
Abs Immature Granulocytes: 0.01 10*3/uL (ref 0.00–0.07)
Basophils Absolute: 0 10*3/uL (ref 0.0–0.1)
Basophils Relative: 1 %
Eosinophils Absolute: 0.1 10*3/uL (ref 0.0–0.5)
Eosinophils Relative: 2 %
HCT: 26.2 % — ABNORMAL LOW (ref 39.0–52.0)
Hemoglobin: 8.8 g/dL — ABNORMAL LOW (ref 13.0–17.0)
Immature Granulocytes: 0 %
Lymphocytes Relative: 27 %
Lymphs Abs: 1.2 10*3/uL (ref 0.7–4.0)
MCH: 25.7 pg — ABNORMAL LOW (ref 26.0–34.0)
MCHC: 33.6 g/dL (ref 30.0–36.0)
MCV: 76.6 fL — ABNORMAL LOW (ref 80.0–100.0)
Monocytes Absolute: 0.3 10*3/uL (ref 0.1–1.0)
Monocytes Relative: 6 %
Neutro Abs: 2.7 10*3/uL (ref 1.7–7.7)
Neutrophils Relative %: 64 %
Platelet Count: 181 10*3/uL (ref 150–400)
RBC: 3.42 MIL/uL — ABNORMAL LOW (ref 4.22–5.81)
RDW: 18.6 % — ABNORMAL HIGH (ref 11.5–15.5)
WBC Count: 4.2 10*3/uL (ref 4.0–10.5)
nRBC: 0 % (ref 0.0–0.2)

## 2020-08-31 MED ORDER — LANREOTIDE ACETATE 120 MG/0.5ML ~~LOC~~ SOLN
120.0000 mg | Freq: Once | SUBCUTANEOUS | Status: AC
Start: 2020-08-31 — End: 2020-08-31
  Administered 2020-08-31: 120 mg via SUBCUTANEOUS
  Filled 2020-08-31: qty 120

## 2020-08-31 NOTE — Progress Notes (Signed)
[place] OFFICE PROGRESS NOTE   Diagnosis: Carcinoid tumor  INTERVAL HISTORY:   Mr. Mcinturff returns for a scheduled visit.  He is here with his daughter.  He continues to reside in the skilled nursing facility.  He is taking octreotide twice daily, Imodium, and Lomotil.  He reports improvement in the diarrhea.  He has 2-3 stools per day.  The stool is soft.  He is able to ambulate short distances.  He says that he will be in the skilled nursing facility approximately 40 more days.  A Foley catheter remains in place.  Objective:  Vital signs in last 24 hours:  Blood pressure (!) 154/97, pulse 83, temperature (!) 97.2 F (36.2 C), temperature source Tympanic, resp. rate 17, height _0  (1.93 m), weight 202 lb 12.8 oz (92 kg), SpO2 100 %.    HEENT: No thrush Resp: Decreased breath sounds at the right greater than left lower chest, no respiratory distress Cardio: Regular rate and rhythm GI: The liver is palpable in the upper abdomen, mildly distended Vascular: 1+ pitting edema at the lower leg bilaterally Neuro: Alert and oriented Skin: Dry skin   Lab Results:  Lab Results  Component Value Date   WBC 4.2 08/31/2020   HGB 8.8 (L) 08/31/2020   HCT 26.2 (L) 08/31/2020   MCV 76.6 (L) 08/31/2020   PLT 181 08/31/2020   NEUTROABS 2.7 08/31/2020    CMP  Lab Results  Component Value Date   NA 139 08/31/2020   K 4.0 08/31/2020   CL 104 08/31/2020   CO2 27 08/31/2020   GLUCOSE 92 08/31/2020   BUN 11 08/31/2020   CREATININE 0.99 08/31/2020   CALCIUM 8.0 (L) 08/31/2020   PROT 6.6 08/31/2020   ALBUMIN 2.1 (L) 08/31/2020   AST 28 08/31/2020   ALT 14 08/31/2020   ALKPHOS 248 (H) 08/31/2020   BILITOT 0.5 08/31/2020   GFRNONAA >60 08/31/2020   GFRAA 38 (L) 06/26/2020    Medications: I have reviewed the patient's current medications.   Assessment/Plan: 1.Metastatic carcinoid tumor, biopsy of a liver lesion 10/23/2017 consistent with a well-differentiated  neuroendocrine neoplasm, WHO grade 2,[id number]%  CT abdomen/pelvis 10/13/2017-extensive liver metastases, cirrhosis, soft tissue mass in the small bowel mesentery versus a primary small bowel tumor, tiny lucent lesions in the pelvic bones  Elevated chromogranin A and 24-hour urine 5-HIAA  CT chest 11/24/2017-subpleural nodule in the left lower lobe with associated radiotracer activity on comparison DOTATATEPET scan. No additional evidence of thoracic metastasis.  DOTATATE PET scan3/01/2018-intense radiotracer accumulation with innumerable confluent hepatic metastasis; intense radiotracer activity within central mesenteric mass; 2 foci of uptake associated with the small bowel; intense radiotracer activity associated periaortic and paraspinal lymph nodes; more distant solitary small metastasis within the pleural space left lower lobe.  Monthly Sandostatin initiated 11/26/2017, dose increased to 30 mg 01/21/2018  Cycle 1 Lutathera 03/03/2018, monthly Sandostatin continued  Cycle 2 Lutathera 04/27/2018, monthly Sandostatin continued  Cycle 3 Lutathera 06/23/2018  Cycle 4 Lutathera 08/18/2018  Restaging dotatate PET scan 09/29/2018-multiple lesions again demonstrated within the liver which accumulate the radiotracer. The number of lesions which accumulate the tracer has decreased visually compared to the prior exam. Some lesions are no longer evident along the right hepatic margin. Remaining measurable lesions have radiotracer activity similar to prior. No new lesions present. Central mesenteric mass unchanged and remains avid for radiotracer. Adjacent lesion within the small bowel SUV max equal 10.5 compared to SUV max equal 15.8. Smaller lesion previously identified in  the upper pelvis small bowel appears decreased in activity. No new metastatic lesions. Lesion position between the right psoas muscle and spine SUV max equal to 21.8 compared with SUV max equal 26.2.  Dotatate PET 10/19/2019-no  evidence of disease progression, stable multifocal hepatic metastases, unchanged from post therapy scan 03/30/2019 and improved from pretherapy scan5/01/2018. Stable mesenteric mass, improved retroperitoneal lymph node adjacent to the celiac trunk, solitary small bowel lesion unchanged  Monthly Sandostatin continued  01/26/2020 chromogranin A level stable elevation  CT abdomen/pelvis 06/05/2020-slight enlargement of central mesenteric mass, extensive hepatic metastases-unchanged, bowel wall thickening in central small bowel loops and right colon  Lanreotide 07/09/2020, 08/06/2020, 08/31/2020  2.Diarrhea-likely secondary to carcinoid syndrome, improved with Sandostatin 3.History of anorexia/weight loss-improved 4.Prostatic hypertrophy;status post laser vaporization 01/05/2018 5. Mild pancytopenia following Lutathera treatment 6.Thoracic dermatomal zoster rash 04/02/2019 treated with Valtrex 7.  Admission 06/05/2020 after a fall with failure to thrive, increased diarrhea, nausea, and abdominal pain 8.  Markedly elevated AST and CPK on admission -likely rhabdomyolysis 9.  Acute renal failure-improved 10.  Staph epidermidis bacteremia and E. coli UTI 06/22/2020 -completed course of IV antibiotics 11.  Admission with hypotension, nausea, and diarrhea 08/14/2020 12.  Anemia-likely secondary to chronic disease and renal insufficiency, chronic Red cell microcytosis likely related to a thalassemia variant or chronic disease 13.  Urinary tract infection-culture from 08/14/2020 + for Pseudomonas aeruginosa    Disposition: Mr. Schweitzer appears stable.  The diarrhea has improved.  He will continue Imodium, Lomotil, and octreotide at the skilled nursing facility.  He continues monthly lanreotide.  I discussed the case with Dr. Leonia Reeves.  Mr. Sam is a candidate for repeat treatment with Lutathera.  Mr. Lott would like to proceed with Lutathera.  I made a nuclear medicine referral today.  We  also made a referral to Dr. Junious Silk to evaluate the indication for removing the Foley catheter.  Mr. Keidel will return for an office visit and lanreotide in 1 month.  Betsy Coder, MD  08/31/2020  2:50 PM

## 2020-09-03 ENCOUNTER — Telehealth: Payer: Self-pay | Admitting: Oncology

## 2020-09-03 NOTE — Telephone Encounter (Signed)
Scheduled appts per 12/10 los. Pt confirmed appt date and time.

## 2020-09-07 ENCOUNTER — Other Ambulatory Visit (HOSPITAL_COMMUNITY): Payer: Medicare Other

## 2020-09-10 ENCOUNTER — Other Ambulatory Visit: Payer: Self-pay | Admitting: *Deleted

## 2020-09-10 NOTE — Patient Outreach (Addendum)
[place] Coordinator follow up. Member screened for potential [facility] Care Management needs as a benefit of [place] Medicare.  Previously received update from [place] revealed family is exploring options for ALF vs LTC.   Will continue to follow transition plans and for potential [facility] Care Management needs while member resides in SNF.    Marthenia Rolling, MSN, RN,BSN [place] Acute Care Coordinator [phone] [facility]) [phone]  (Toll free office)

## 2020-09-25 ENCOUNTER — Telehealth: Payer: Self-pay | Admitting: *Deleted

## 2020-09-25 ENCOUNTER — Other Ambulatory Visit: Payer: Self-pay | Admitting: *Deleted

## 2020-09-25 NOTE — Telephone Encounter (Signed)
Patient tested positive for COVID today in SNF. He has no symptoms. All staff and residents were tested today. Asking about his appointments tomorrow. Informed they must be cancelled and rescheduled for 10 days later. Scheduling message sent. Also notified nuclear medicine so they can reschedule as well.

## 2020-09-25 NOTE — Patient Outreach (Signed)
[facility] Post- Acute Care Coordinator follow up. Member screened for potential [facility] Care Management needs.  Communication sent to [facility] SN[facility] SW to request update on transition plan.   Marthenia Rolling, MSN, RN,BSN [place] Acute Care Coordinator [phone] [facility]) [phone]  (Toll free office)

## 2020-09-26 ENCOUNTER — Inpatient Hospital Stay: Payer: Medicare Other

## 2020-09-26 ENCOUNTER — Inpatient Hospital Stay: Payer: Medicare Other | Admitting: Nurse Practitioner

## 2020-09-26 ENCOUNTER — Telehealth: Payer: Self-pay | Admitting: *Deleted

## 2020-09-26 ENCOUNTER — Ambulatory Visit (HOSPITAL_COMMUNITY): Admission: RE | Admit: 2020-09-26 | Payer: Medicare Other | Source: Ambulatory Visit

## 2020-09-26 NOTE — Telephone Encounter (Signed)
Asking if the missed sando injection can be given at the facility? Informed her that this is a specialty drug and they would not have it and it requires special technique to administer. Keep visit on 1/21 as scheduled.

## 2020-10-05 ENCOUNTER — Other Ambulatory Visit: Payer: Self-pay | Admitting: *Deleted

## 2020-10-05 NOTE — Patient Outreach (Signed)
[facility] Post- Acute Care Coordinator follow up.   Charles Schmidt remains in [facility] SNF.  Update received from [place] indicating member has tested positive for COVID. Will potentially transition to LTC. However, uncertain if all family members are in agreement.   Will continue to follow for transition plans and for potential [facility] needs.   Marthenia Rolling, MSN, RN,BSN [place] Acute Care Coordinator [phone] [facility]) [phone]  (Toll free office)

## 2020-10-09 ENCOUNTER — Emergency Department (HOSPITAL_COMMUNITY): Payer: Medicare Other

## 2020-10-09 ENCOUNTER — Inpatient Hospital Stay (HOSPITAL_COMMUNITY)
Admission: EM | Admit: 2020-10-09 | Discharge: 2020-10-23 | DRG: 177 | Disposition: E | Payer: Medicare Other | Source: Skilled Nursing Facility | Attending: Internal Medicine | Admitting: Internal Medicine

## 2020-10-09 ENCOUNTER — Other Ambulatory Visit: Payer: Self-pay

## 2020-10-09 DIAGNOSIS — R0603 Acute respiratory distress: Secondary | ICD-10-CM | POA: Diagnosis not present

## 2020-10-09 DIAGNOSIS — Z789 Other specified health status: Secondary | ICD-10-CM | POA: Diagnosis not present

## 2020-10-09 DIAGNOSIS — N390 Urinary tract infection, site not specified: Secondary | ICD-10-CM | POA: Diagnosis present

## 2020-10-09 DIAGNOSIS — A419 Sepsis, unspecified organism: Secondary | ICD-10-CM | POA: Diagnosis not present

## 2020-10-09 DIAGNOSIS — E872 Acidosis, unspecified: Secondary | ICD-10-CM | POA: Diagnosis present

## 2020-10-09 DIAGNOSIS — I129 Hypertensive chronic kidney disease with stage 1 through stage 4 chronic kidney disease, or unspecified chronic kidney disease: Secondary | ICD-10-CM | POA: Diagnosis present

## 2020-10-09 DIAGNOSIS — R0602 Shortness of breath: Secondary | ICD-10-CM | POA: Diagnosis present

## 2020-10-09 DIAGNOSIS — Z8249 Family history of ischemic heart disease and other diseases of the circulatory system: Secondary | ICD-10-CM

## 2020-10-09 DIAGNOSIS — Y95 Nosocomial condition: Secondary | ICD-10-CM | POA: Diagnosis present

## 2020-10-09 DIAGNOSIS — R188 Other ascites: Secondary | ICD-10-CM | POA: Diagnosis present

## 2020-10-09 DIAGNOSIS — M6282 Rhabdomyolysis: Secondary | ICD-10-CM | POA: Diagnosis present

## 2020-10-09 DIAGNOSIS — I82443 Acute embolism and thrombosis of tibial vein, bilateral: Secondary | ICD-10-CM | POA: Diagnosis present

## 2020-10-09 DIAGNOSIS — Z8042 Family history of malignant neoplasm of prostate: Secondary | ICD-10-CM

## 2020-10-09 DIAGNOSIS — I2699 Other pulmonary embolism without acute cor pulmonale: Secondary | ICD-10-CM | POA: Diagnosis present

## 2020-10-09 DIAGNOSIS — C787 Secondary malignant neoplasm of liver and intrahepatic bile duct: Secondary | ICD-10-CM | POA: Diagnosis present

## 2020-10-09 DIAGNOSIS — J1282 Pneumonia due to coronavirus disease 2019: Secondary | ICD-10-CM | POA: Diagnosis present

## 2020-10-09 DIAGNOSIS — U071 COVID-19: Secondary | ICD-10-CM | POA: Diagnosis present

## 2020-10-09 DIAGNOSIS — I4510 Unspecified right bundle-branch block: Secondary | ICD-10-CM | POA: Diagnosis present

## 2020-10-09 DIAGNOSIS — R54 Age-related physical debility: Secondary | ICD-10-CM | POA: Diagnosis present

## 2020-10-09 DIAGNOSIS — C7A00 Malignant carcinoid tumor of unspecified site: Secondary | ICD-10-CM | POA: Diagnosis present

## 2020-10-09 DIAGNOSIS — I959 Hypotension, unspecified: Secondary | ICD-10-CM

## 2020-10-09 DIAGNOSIS — N138 Other obstructive and reflux uropathy: Secondary | ICD-10-CM | POA: Diagnosis present

## 2020-10-09 DIAGNOSIS — D631 Anemia in chronic kidney disease: Secondary | ICD-10-CM | POA: Diagnosis present

## 2020-10-09 DIAGNOSIS — N179 Acute kidney failure, unspecified: Secondary | ICD-10-CM | POA: Diagnosis present

## 2020-10-09 DIAGNOSIS — R7989 Other specified abnormal findings of blood chemistry: Secondary | ICD-10-CM | POA: Diagnosis not present

## 2020-10-09 DIAGNOSIS — Z66 Do not resuscitate: Secondary | ICD-10-CM | POA: Diagnosis not present

## 2020-10-09 DIAGNOSIS — R338 Other retention of urine: Secondary | ICD-10-CM | POA: Diagnosis present

## 2020-10-09 DIAGNOSIS — Z515 Encounter for palliative care: Secondary | ICD-10-CM

## 2020-10-09 DIAGNOSIS — I82431 Acute embolism and thrombosis of right popliteal vein: Secondary | ICD-10-CM | POA: Diagnosis present

## 2020-10-09 DIAGNOSIS — I82413 Acute embolism and thrombosis of femoral vein, bilateral: Secondary | ICD-10-CM | POA: Diagnosis present

## 2020-10-09 DIAGNOSIS — R6520 Severe sepsis without septic shock: Secondary | ICD-10-CM | POA: Diagnosis present

## 2020-10-09 DIAGNOSIS — H409 Unspecified glaucoma: Secondary | ICD-10-CM | POA: Diagnosis present

## 2020-10-09 DIAGNOSIS — Z7401 Bed confinement status: Secondary | ICD-10-CM

## 2020-10-09 DIAGNOSIS — N401 Enlarged prostate with lower urinary tract symptoms: Secondary | ICD-10-CM | POA: Diagnosis present

## 2020-10-09 DIAGNOSIS — J9601 Acute respiratory failure with hypoxia: Secondary | ICD-10-CM | POA: Diagnosis present

## 2020-10-09 DIAGNOSIS — Z823 Family history of stroke: Secondary | ICD-10-CM

## 2020-10-09 DIAGNOSIS — E861 Hypovolemia: Secondary | ICD-10-CM | POA: Diagnosis present

## 2020-10-09 DIAGNOSIS — C799 Secondary malignant neoplasm of unspecified site: Secondary | ICD-10-CM

## 2020-10-09 DIAGNOSIS — N1831 Chronic kidney disease, stage 3a: Secondary | ICD-10-CM | POA: Diagnosis present

## 2020-10-09 DIAGNOSIS — R627 Adult failure to thrive: Secondary | ICD-10-CM | POA: Diagnosis present

## 2020-10-09 DIAGNOSIS — G9341 Metabolic encephalopathy: Secondary | ICD-10-CM | POA: Diagnosis present

## 2020-10-09 DIAGNOSIS — M7989 Other specified soft tissue disorders: Secondary | ICD-10-CM | POA: Diagnosis not present

## 2020-10-09 DIAGNOSIS — Z7189 Other specified counseling: Secondary | ICD-10-CM | POA: Diagnosis not present

## 2020-10-09 DIAGNOSIS — Z79899 Other long term (current) drug therapy: Secondary | ICD-10-CM

## 2020-10-09 LAB — CBC WITH DIFFERENTIAL/PLATELET
Abs Immature Granulocytes: 0.05 10*3/uL (ref 0.00–0.07)
Basophils Absolute: 0 10*3/uL (ref 0.0–0.1)
Basophils Relative: 0 %
Eosinophils Absolute: 0 10*3/uL (ref 0.0–0.5)
Eosinophils Relative: 0 %
HCT: 27.5 % — ABNORMAL LOW (ref 39.0–52.0)
Hemoglobin: 9.3 g/dL — ABNORMAL LOW (ref 13.0–17.0)
Immature Granulocytes: 1 %
Lymphocytes Relative: 22 %
Lymphs Abs: 1.1 10*3/uL (ref 0.7–4.0)
MCH: 26.1 pg (ref 26.0–34.0)
MCHC: 33.8 g/dL (ref 30.0–36.0)
MCV: 77.2 fL — ABNORMAL LOW (ref 80.0–100.0)
Monocytes Absolute: 0.2 10*3/uL (ref 0.1–1.0)
Monocytes Relative: 5 %
Neutro Abs: 3.5 10*3/uL (ref 1.7–7.7)
Neutrophils Relative %: 72 %
Platelets: 124 10*3/uL — ABNORMAL LOW (ref 150–400)
RBC: 3.56 MIL/uL — ABNORMAL LOW (ref 4.22–5.81)
RDW: 21.6 % — ABNORMAL HIGH (ref 11.5–15.5)
WBC: 4.9 10*3/uL (ref 4.0–10.5)
nRBC: 0 % (ref 0.0–0.2)

## 2020-10-09 LAB — URINALYSIS, ROUTINE W REFLEX MICROSCOPIC
Glucose, UA: NEGATIVE mg/dL
Ketones, ur: NEGATIVE mg/dL
Nitrite: NEGATIVE
Protein, ur: 100 mg/dL — AB
Specific Gravity, Urine: 1.017 (ref 1.005–1.030)
WBC, UA: >50 WBC/hpf — ABNORMAL HIGH (ref 0–5)
pH: 5 (ref 5.0–8.0)

## 2020-10-09 LAB — COMPREHENSIVE METABOLIC PANEL
ALT: 23 U/L (ref 0–44)
AST: 65 U/L — ABNORMAL HIGH (ref 15–41)
Albumin: 1.8 g/dL — ABNORMAL LOW (ref 3.5–5.0)
Alkaline Phosphatase: 198 U/L — ABNORMAL HIGH (ref 38–126)
Anion gap: 16 — ABNORMAL HIGH (ref 5–15)
BUN: 18 mg/dL (ref 8–23)
CO2: 18 mmol/L — ABNORMAL LOW (ref 22–32)
Calcium: 8 mg/dL — ABNORMAL LOW (ref 8.9–10.3)
Chloride: 107 mmol/L (ref 98–111)
Creatinine, Ser: 1.63 mg/dL — ABNORMAL HIGH (ref 0.61–1.24)
GFR, Estimated: 43 mL/min — ABNORMAL LOW (ref >60)
Glucose, Bld: 164 mg/dL — ABNORMAL HIGH (ref 70–99)
Potassium: 4.2 mmol/L (ref 3.5–5.1)
Sodium: 141 mmol/L (ref 135–145)
Total Bilirubin: 1.1 mg/dL (ref 0.3–1.2)
Total Protein: 7 g/dL (ref 6.5–8.1)

## 2020-10-09 LAB — LACTIC ACID, PLASMA
Lactic Acid, Venous: 8.1 mmol/L (ref 0.5–1.9)
Lactic Acid, Venous: >11 mmol/L (ref 0.5–1.9)

## 2020-10-09 LAB — PROTIME-INR
INR: 1.5 — ABNORMAL HIGH (ref 0.8–1.2)
Prothrombin Time: 17.8 seconds — ABNORMAL HIGH (ref 11.4–15.2)

## 2020-10-09 LAB — APTT: aPTT: 31 seconds (ref 24–36)

## 2020-10-09 MED ORDER — LACTATED RINGERS IV BOLUS (SEPSIS)
1000.0000 mL | Freq: Once | INTRAVENOUS | Status: AC
  Administered 2020-10-09: 1000 mL via INTRAVENOUS

## 2020-10-09 MED ORDER — SODIUM CHLORIDE 0.9 % IV SOLN
2.0000 g | Freq: Two times a day (BID) | INTRAVENOUS | Status: DC
  Administered 2020-10-10 – 2020-10-14 (×9): 2 g via INTRAVENOUS
  Filled 2020-10-09 (×10): qty 2

## 2020-10-09 MED ORDER — LACTATED RINGERS IV SOLN: INTRAVENOUS | Status: AC

## 2020-10-09 MED ORDER — IOHEXOL 300 MG/ML  SOLN
75.0000 mL | Freq: Once | INTRAMUSCULAR | Status: AC | PRN
  Administered 2020-10-09: 75 mL via INTRAVENOUS

## 2020-10-09 MED ORDER — SODIUM CHLORIDE 0.9 % IV SOLN
2.0000 g | Freq: Once | INTRAVENOUS | Status: AC
  Administered 2020-10-09: 2 g via INTRAVENOUS
  Filled 2020-10-09: qty 2

## 2020-10-09 MED ORDER — FENTANYL CITRATE (PF) 100 MCG/2ML IJ SOLN
50.0000 ug | INTRAMUSCULAR | Status: AC | PRN
  Administered 2020-10-09 – 2020-10-10 (×3): 50 ug via INTRAVENOUS
  Filled 2020-10-09 (×3): qty 2

## 2020-10-09 NOTE — ED Notes (Signed)
Left hand IV site infiltrated. Left hand elevated.

## 2020-10-09 NOTE — Sepsis Progress Note (Signed)
Notified provider of need to order repeat lactic acid. ° °

## 2020-10-09 NOTE — H&P (Signed)
History and Physical    PLEASE NOTE THAT DRAGON DICTATION SOFTWARE WAS USED IN THE CONSTRUCTION OF THIS NOTE.   Charles Schmidt BBU:[id number] DOB: 10/02/1940 DOA: 10/07/2020  PCP: Nolene Ebbs, MD Patient coming from: home snf  I have personally briefly reviewed patient's old medical records in [place]  Chief Complaint: Altered mental status  HPI: Charles Schmidt is a 80 y.o. male with medical history significant for malignant carcinoid tumor with metastasis to the liver, essential hypertension, urinary retention in setting of BPH, anemia of chronic disease with baseline hemoglobin 8.5-10.0, who is admitted to [facility] on 3/81/8403 with complicated urinary tract infection after presenting from snf to [facility] Emergency Department for evaluation of altered mental status.  The staff at the patient's skilled nursing facility reportedly noted the patient to be more slow than normal, finding him difficult to arouse while sitting in his chair, prompting EMS to be contacted following which the patient was brought to [facility] emergency department for further evaluation of altered mental status.  Of note, the patient has a history of malignant carcinoid tumor with metastatic disease to the liver.  He was recently hospitalized for Pseudomonas urinary tract infection, during which the patient worked with the palliative care service, establishing a CODE STATUS of DNR/DNI.  However, following discharge this most recent prior hospitalization, the patient and his family reportedly had additional discussions and reviewing the patient's CODE STATUS, ultimately deciding to convert back to full code, which is where his CODE STATUS currently remains.  His medical history is also notable for a history of recurrent urinary tract infections in the setting of urinary retention due to BPH.  Additionally he has a history of anemia of chronic disease associate with baseline hemoglobin of 8.5-10.0.  No reported  evidence of recent seizure-like activity.  And staff at patient's skilled nursing did not report any recent trauma involving the patient.  No known COVID-19 exposures.    ED Course:  Vital signs in the ED were notable for the following: Temperature max 97.4; heart rate 72-1 11; initial blood pressure noted to be 85/68, which subsequently improved to 105/78 following interval administration of IV fluids, as further described below; respiratory rate 16-22.   Labs were notable for the following: CMP was notable for the following: Sodium 141, bicarbonate 18, anion gap 16, BUN 18, creatinine 1.63 relative to most recent prior serum creatinine did 1-0.99 when checked on 08/31/2020, glucose 164.  CBC was notable for the following: White blood cell count of 4900, hemoglobin 9.3 relative to most recent prior value of 8.8 on 08/31/2020.  Initial lactic acid noted to be elevated 8.1, with subsequent increase to 11 in spite of interval administration of IV fluids, as further quantified below.  INR 1.5.  Urinalysis was notable for the following: Greater than 50 white blood cells and many bacteria as well as moderate leukocyte esterase, with 100 proteinuria.  Screening COVID-19 PCR was found to be positive.  Blood cultures x2 were collected prior to initiation of any antibiotics.  EKG showed sinus rhythm with incomplete right bundle branch block, ventricular rhythm 85, nonspecific T wave inversion in V1 through V3 which is unchanged relative to most recent prior EKG from 08/15/2020, and no evidence of ST changes, including no evidence of ST elevation.  Chest x-ray showed a right pleural effusion, but otherwise showed no evidence of acute cardiopulmonary process, including no evidence of infiltrate, edema, or pneumothorax.  CT abdomen/pelvis showed evidence of diffuse hepatic metastatic disease, with  associated worsening in the interval since most recent prior CT imaging.  Given the patient's initial presenting  hypotension, the emergency department physician discussed the patient's case with the on-call critical care physician, Dr. Lorenso Courier, who following the patient's improved blood pressure after administration of IV fluids initiation of antibiotics, did not feel there could be a current role for critical care given the patient's current status, and rather recommended admission to the hospitalist service.  Additionally, given the patient's recent changes to CODE STATUS, the emergency department physician discussed the patient's case with the on-call palliative care personnel, who will formally consult for assistance with goals of care clarification as well as clarification of current CODE STATUS.  While in the ED, the following were administered: Cefepime 2 g IV x1, lactated Ringer's x3 L bolus followed by maintenance lactated Ringer's at 150 cc/h.  Fentanyl 50 mg IV x2.  Subsequently, the patient was admitted to the PCU for further evaluation management of presenting complicated urinary tract infection associated with the acute metabolic encephalopathy and anion gap metabolic acidosis.    Review of Systems: As per HPI otherwise 10 point review of systems negative.   Past Medical History:  Diagnosis Date  . Bladder diverticulum 06/06/2020  . BPH with obstruction/lower urinary tract symptoms   . Cataract, mature    12-28-2017  per pt right eye, legally blind  . Diarrhea concurrent with and due to carcinoid syndrome ([place])    12-28-2017 improvement since started Sandostatin monthly  . ED (erectile dysfunction)   . Elevated PSA   . Family history of cancer in son   . Family history of prostate cancer   . Foley catheter in place   . Glaucoma, left eye   . History of colon polyps   . History of urinary retention    2015; 2016; 01/ 2019  . Hypertension   . Metastatic malignant neuroendocrine tumor to liver [facility]) oncoloigst-  dr Benay Spice   dx 10-23-2017 by liver bx-- WHO grade 2; small bowel mesenteric  mass, cirrhosis, mets left lower lobe solitary nodule ,  periaortic and paraspinal lymph nodes METS--  started monthly Sandostatin 11-20-2017  . Microcytic anemia   . RBBB (right bundle branch block with left anterior fascicular block)   . Urinary retention   . Venous reflux     Past Surgical History:  Procedure Laterality Date  . INGUINAL HERNIA REPAIR  1990s   unsure side  . IR US GUIDE BX ASP/DRAIN  10/23/2017  . QUADRICEPS TENDON REPAIR Left 01-08-2011   dr Veverly Fells  . THULIUM LASER TURP (TRANSURETHRAL RESECTION OF PROSTATE) N/A 01/05/2018   Procedure: Marcelino Duster LASER OF THE PROSTATE;  Surgeon: Festus Aloe, MD;  Location: [facility];  Service: Urology;  Laterality: N/A;  . TONSILLECTOMY  child    Social History:  reports that he has never smoked. He has never used smokeless tobacco. He reports previous alcohol use of about 1.0 standard drink of alcohol per week. He reports that he does not use drugs.   No Known Allergies  Family History  Problem Relation Age of Onset  . Hypertension Mother   . Stroke Father   . Prostate cancer Father        'slow growing'  . Kidney Stones Sister   . Cancer Son 65       neuroendocrine tumors of small intestine    Prior to Admission medications   Medication Sig Start Date End Date Taking? Authorizing Provider  acetaminophen (TYLENOL) 325 MG  tablet Take 2 tablets (650 mg total) by mouth every 6 (six) hours as needed for moderate pain, fever or headache. 07/03/20   Martyn Malay, MD  cholestyramine light (PREVALITE) 4 g packet Take 4 g by mouth 2 (two) times daily.    [provider]  diphenoxylate-atropine (LOMOTIL) 2.5-0.025 MG tablet Take 1 tablet by mouth 4 (four) times daily. 07/03/20   Martyn Malay, MD  dorzolamide-timolol (COSOPT) 22.3-6.8 MG/ML ophthalmic solution Place 1 drop into the left eye 2 (two) times daily.     [provider]  finasteride (PROSCAR) 5 MG tablet Take 5 mg by mouth at  bedtime.     [provider]  furosemide (LASIX) 20 MG tablet Take 20 mg by mouth daily.    [provider]  liver oil-zinc oxide (DESITIN) 40 % ointment Apply 1 application topically 2 (two) times daily at 8 am and 10 pm.    [provider]  loperamide (IMODIUM) 2 MG capsule Take 1 capsule (2 mg total) by mouth 3 (three) times daily. 08/21/20   Domenic Polite, MD  Multiple Vitamins-Minerals (CENTRUM SILVER) tablet Take 1 tablet by mouth at bedtime.    [provider]  Nystatin (GERHARDT'S BUTT CREAM) CREA Apply 1 application topically 2 (two) times daily. Apply to sacrum and scrotum Patient not taking: Reported on 09/12/2020 07/03/20   Martyn Malay, MD  octreotide (SANDOSTATIN) 100 MCG/ML SOLN injection Inject 1.5 mLs (150 mcg total) into the skin every 12 (twelve) hours. 08/21/20   Domenic Polite, MD  ondansetron (ZOFRAN) 4 MG tablet Take 1 tablet (4 mg total) by mouth every 6 (six) hours as needed for nausea. Patient not taking: Reported on 09/12/2020 07/03/20   Martyn Malay, MD  oxyCODONE (OXY IR/ROXICODONE) 5 MG immediate release tablet Take 1 tablet (5 mg total) by mouth every 6 (six) hours as needed for moderate pain. 08/21/20   Domenic Polite, MD  potassium chloride (MICRO-K) 10 MEQ CR capsule Take 10 mEq by mouth 2 (two) times daily.    [provider]  sodium bicarbonate 650 MG tablet Take 2 tablets (1,300 mg total) by mouth 3 (three) times daily. 07/03/20   Martyn Malay, MD  tamsulosin (FLOMAX) 0.4 MG CAPS capsule Take 1 capsule (0.4 mg total) by mouth daily after supper. 07/03/20   Martyn Malay, MD  Travoprost, BAK Free, (TRAVATAN) 0.004 % SOLN ophthalmic solution Place 1 drop into the left eye at bedtime.     [provider]     Objective    Physical Exam: Vitals:   10/11/2020 2115 10/07/2020 2130 10/08/2020 2203 10/20/2020 2215  BP: 109/82 109/80 107/78 112/80  Pulse: 90 90 87 88  Resp: 17 14 18 14   Temp:       TempSrc:      SpO2: 95% 95% 96% 97%    General: appears to be stated age; somnolent, opening eyes briefly to verbal stimuli and occasionally offering 4-5 word phrases. Skin: warm, dry,  Head:  AT/[place] Mouth:  Oral mucosa membranes appear dry, normal dentition Neck: supple; trachea midline Heart:  RRR; did not appreciate any M/R/G Lungs: CTAB, did not appreciate any wheezes, rales, or rhonchi Abdomen: + BS; soft, ND,  Vascular: 2+ pedal pulses b/l; 2+ radial pulses b/l Extremities: no peripheral edema, no muscle wasting Neuro: In the setting of patient's altered mental status, and associated ability to follow instructions at this time, unable to perform full neurologic assessment at the present, including unable  to perform full assessment of strength, sensation, or cranial nerve evaluation at this time.   Labs on Admission: I have personally reviewed following labs and imaging studies  CBC: Recent Labs  Lab 09/28/2020 1755  WBC 4.9  NEUTROABS 3.5  HGB 9.3*  HCT 27.5*  MCV 77.2*  PLT 557*   Basic Metabolic Panel: Recent Labs  Lab 10/08/2020 1755  NA 141  K 4.2  CL 107  CO2 18*  GLUCOSE 164*  BUN 18  CREATININE 1.63*  CALCIUM 8.0*   GFR: CrCl cannot be calculated (Unknown ideal weight.). Liver Function Tests: Recent Labs  Lab 10/11/2020 1755  AST 65*  ALT 23  ALKPHOS 198*  BILITOT 1.1  PROT 7.0  ALBUMIN 1.8*   No results for input(s): LIPASE, AMYLASE in the last 168 hours. No results for input(s): AMMONIA in the last 168 hours. Coagulation Profile: Recent Labs  Lab 10/19/2020 1644  INR 1.5*   Cardiac Enzymes: No results for input(s): CKTOTAL, CKMB, CKMBINDEX, TROPONINI in the last 168 hours. BNP (last 3 results) No results for input(s): PROBNP in the last 8760 hours. HbA1C: No results for input(s): HGBA1C in the last 72 hours. CBG: No results for input(s): GLUCAP in the last 168 hours. Lipid Profile: No results for input(s): CHOL, HDL, LDLCALC, TRIG,  CHOLHDL, LDLDIRECT in the last 72 hours. Thyroid Function Tests: No results for input(s): TSH, T4TOTAL, FREET4, T3FREE, THYROIDAB in the last 72 hours. Anemia Panel: No results for input(s): VITAMINB12, FOLATE, FERRITIN, TIBC, IRON, RETICCTPCT in the last 72 hours. Urine analysis:    Component Value Date/Time   COLORURINE AMBER (A) 09/24/2020 2029   APPEARANCEUR TURBID (A) 09/24/2020 2029   LABSPEC 1.017 09/22/2020 2029   PHURINE 5.0 09/28/2020 2029   GLUCOSEU NEGATIVE 10/17/2020 2029   HGBUR SMALL (A) 09/22/2020 2029   BILIRUBINUR SMALL (A) 10/13/2020 2029   KETONESUR NEGATIVE 10/08/2020 2029   PROTEINUR 100 (A) 10/22/2020 2029   UROBILINOGEN 0.2 10/22/2014 2058   NITRITE NEGATIVE 10/08/2020 2029   LEUKOCYTESUR MODERATE (A) 10/04/2020 2029    Radiological Exams on Admission: CT Abdomen Pelvis W Contrast  Result Date: 09/22/2020 CLINICAL DATA:  History of neuroendocrine tumor with possible infection EXAM: CT ABDOMEN AND PELVIS WITH CONTRAST TECHNIQUE: Multidetector CT imaging of the abdomen and pelvis was performed using the standard protocol following bolus administration of intravenous contrast. CONTRAST:  93m OMNIPAQUE IOHEXOL 300 MG/ML  SOLN COMPARISON:  08/14/2020 FINDINGS: Lower chest: Lung bases demonstrate bilateral pleural effusions right greater than left increased when compared with the prior exam. The degree of right lower lobe consolidation has also increased from the prior exam. The visualized pulmonary artery demonstrates some suggestion of filling defects within the left lower lobe pulmonary arterial branches. These are incompletely evaluated on this exam. Hepatobiliary: Liver again demonstrates changes consistent with diffuse hepatic metastatic disease. The degree of decreased attenuation within the liver has increased significantly in the interval from the prior exam consistent with progressive metastatic disease. Scattered calcifications are again identified when compared  with the prior study. Previously described central area of calcification is again identified and measures approximately 17 mm. The surrounding hepatic parenchyma however demonstrates diffuse decreased attenuation consistent with further metastatic disease. Gallbladder demonstrates dependent density likely related to milk of calcium. No biliary ductal dilatation is seen. The portal vein is attenuated but patent. This is similar to that noted on the prior exam. Pancreas: Unremarkable. No pancreatic ductal dilatation or surrounding inflammatory changes. Spleen: Normal in size without focal abnormality.  Adrenals/Urinary Tract: Adrenal glands are within normal limits. The kidneys are well visualize within normal enhancement pattern bilaterally. Left renal cyst is again noted and stable. Delayed images demonstrate normal enhancement. The bladder is decompressed by Foley catheter. Stomach/Bowel: The colon is predominately decompressed the appendix is air-filled and within normal limits. Small bowel demonstrates some reactive wall thickening distally related to the associated ascites which has increased in the interval from the prior exam. No obstructive changes are seen. Vascular/Lymphatic: Atherosclerotic calcifications are seen without aneurysmal dilatation. There is again noted a partially calcified central mesenteric mass which again measures 3.5 cm in greatest dimension stable in appearance from the prior exam. Reproductive: Prostate is again somewhat heterogeneous but within normal limits. Other: Significant ascites is noted when compared with the prior exam this has increased. Musculoskeletal: Degenerative changes of lumbar spine are noted. No acute bony abnormality is seen. IMPRESSION: Changes consistent with diffuse hepatic metastatic disease which appears to have increased in the interval from the prior exam. Increase in bilateral pleural effusions right greater than left with associated lower lobe consolidation  on the right. Findings suspicious for pulmonary emboli in the left lower lobe. CTA of the chest could be performed to evaluate although felt to be best performed after short period of delay due to the patient's mildly elevated creatinine and decreased GFR. Increase in the degree of ascites consistent with the progressive metastatic disease. Stable central mesenteric mass consistent with the given clinical history. Some increased bowel wall thickening is noted in the distal small bowel. This could be related to the underlying mesenteric mass although may simply be reactive to the increase in ascites. Electronically Signed   By: Inez Catalina M.D.   On: 10/08/2020 20:34   DG Chest Port 1 View  Result Date: 10/04/2020 CLINICAL DATA:  Found unresponsive, possible sepsis EXAM: PORTABLE CHEST 1 VIEW COMPARISON:  08/14/2020 FINDINGS: Cardiac shadow is within normal limits. Increased density is noted in the right hemithorax likely related to a posteriorly layering effusion. No definitive underlying infiltrate is seen. Left lung is clear. No bony abnormality is noted. IMPRESSION: Right-sided pleural effusion. No definitive infiltrate is seen although may be obscured in the lower lobe by the effusion. Electronically Signed   By: Inez Catalina M.D.   On: 10/08/2020 17:22     EKG: Independently reviewed, with result as described above.    Assessment/Plan   Charles Schmidt is a 80 y.o. male with medical history significant for malignant carcinoid tumor with metastasis to the liver, essential hypertension, urinary retention in setting of BPH, anemia of chronic disease with baseline hemoglobin 8.5-10.0, who is admitted to [facility] on 05/20/9370 with complicated urinary tract infection after presenting from snf to [facility] Emergency Department for evaluation of altered mental status.    Principal Problem:   Complicated UTI (urinary tract infection) Active Problems:   Acute metabolic encephalopathy   Lactic  acidosis   COVID-19 virus infection   AKI (acute kidney injury) (HCC)   Hypotension    #) Complicated urinary tract infection: In the context of presenting acute encephalopathy, urinalysis was found to be suggestive of underlying UTI in the setting of significant pyuria, many bacteria, and moderate leukocyte Estrace, and this patient with a history of recurrent urinary tract infections associated with urinary retention due to BPH as a predisposing factor.  Of note, SIRS criteria are not met at this time for presentation to be consistent with sepsis.  On the basis of patient's gender, his UTI is  by definition considered complicated.  Given his recent hospitalization for Pseudomonas UTI, will continue antipseudomonal cefepime that was initiated in the ED this evening.  Plan: Continue maintenance lactated Ringer's.  Continue cefepime, as above.  Monitor closely for results of blood cultures x2 as well as urine cultures collected this evening prior to initiation of antibiotics.  Repeat CBC with differential in the morning.      #) Acute metabolic encephalopathy: Noted increased somnolence and confusion over the course the last day, which is likely multifactorial in nature, with multiple metabolic contributions in the context of presenting urinary tract infection as well as additional metabolic factors that include anion gap metabolic acidosis due to lactic acidosis, as further described below, as well as acute kidney injury.  Clinically, seizures and acute ischemic stroke appear less likely at this time.  Plan: Ordered management presenting urinary tract infection, as above.  Additional work-up and evaluation presenting lactic acidosis, as further described below.  Repeat CMP in the morning.  Check CPK in the setting of acute encephalopathy, lactic acidosis, and acute kidney injury.  Check VBG to evaluate for any contribution from hypercapnic encephalopathy.  We will also check ammonia level given  documented history of metastatic disease to the liver.     #) Lactic acidosis: Presenting lactate noted to be 8.1, with interval increase to 11 following administration of 3 L of lactated Ringer boluses.  As discussed above, criteria not met for sepsis at this time, and current lactic acidosis is felt to be less likely on the basis of this infectious etiology.  However, patient has multiple noninfectious circumstances that are likely contributing to his elevated lactic acid level, including that underlying metastatic disease which, in and of itself can cause significant elevations in lactic acid level.  Additionally, he was initially found to be hypotensive upon presentation, resulting in generalized hypoperfusion that is subsequently resolved.  Furthermore, presenting acute kidney injury is likely also providing some contribution to elevated lactic acid level.  Plan: Repeat INR to further evaluate hepatic synthetic function given metastatic disease to the liver.  Continue maintenance lactated Ringer's at 150 cc/h.  We will repeat lactic acid level in the morning.  Recommend management of acute kidney injury, as further described below.  Repeat CMP in the morning.  Check CPK, as above.  Check blood gas for further evaluation of current acid base state.     #) Acute kidney injury: Relative to most recent prior creatinine 0.99 when checked on 08/31/2020, this evening's labs reflect interval increase in serum creatinine to 1.63.  Suspect that this is prerenal in the setting of diminished renal perfusion, in part as a consequence of presenting systemic hypotension, as further described below.  Clinically, he also appears to be slightly dehydrated, posing a suspect additional prerenal contribution.  We will monitor closely ensuing strict I's and O's, particular given that the patient has a history of urinary retention, increasing his risk for postrenal obstructive sources of acute kidney injury.  Plan:  Continue maintenance lactated Ringer's, as above.  Monitor strict I's and O's and daily weights.  Attempt to avoid nephrotoxic agents.  Repeat CMP in the morning.  Hold home Lasix for now.  Add on random urine sodium as well as random urine creatinine.  Check CPK, as above.     #) Goals of care/CODE STATUS: Recent changes to the patient's CODE STATUS, as further detailed above.  Would benefit from additional clarification of his goals of care as well as further evaluation  of true current CODE STATUS.  Consequently, the on-call palliative care provider was consulted, and plans to evaluate the patient in person for clarification of the above.  Plan: Formal bite of care consult, as above.      #) COVID-19 infection: Screening COVID-19 test performed in the ED this evening found to be positive.  Presentation does not appear to be associate with acute hypoxic respiratory distress/failure, with patient maintaining oxygen saturation greater than 94% on room air. Overall, it does not appear that criteria are met at the present time for patient's COVID-19 infection to be considered severe in nature. Consequently, there does not appear to be an indication at this time for initiation of dexamethasone per treatment guidance recommendations from [facility]'s Covid Treatment Guidelines. Of note, in the setting of the patient's age and history of hypertension, this patient meets criteria to be considered high risk for a more complicated clinical course of COVID-19 infection, including increased probability for progression of the severity associated with their infectious course. Therefore,  given unclear duration of any potential underlying respiratory symptoms , and even though the patient is not being admitted specifically for evaluation and management of COVID-19 infection, in the presence of high risk criteria, indications are met for initiation of 3-day course of remdesivir per treatment guidance recommendations  from 's Covid Treatment Guidelines due to associated benefit of decreased risk for progression of severity of their COVID-19 infection with early initiation of remdesivir in this setting. Of note, ALT found to be less than 220. Therefore, there is no contraindication for initiation of remdesivir on the basis of transaminitis.  No known chronic underlying pulmonary pathology.  Presenting chest x-ray result, as further described above.    Plan: Airborne and contact precautions. Monitor continuous pulse oximetry and monitor on telemetry. prn supplemental O2 to maintain O2 sats greater than or equal to 94%. Proning protocol initiated.  PRN acetaminophen for fever. Start remdesivir, as above. Refraining from initiation of dexamethasone, as above. Check inflammatory markers (fibrinogen, d dimer or fibrin derivatives, crp, ferritin, LDH) in the morning. Check serum magnesium and phosphorus levels. Check CMP and CBC in the morning. Flutter valve.       #) Anemia of chronic disease: Associated with baseline hemoglobin of 8.5-10, presenting hemoglobin found to be within his baseline range, with presenting hemoglobin noted to be 9.3.  No evidence of acute bleed.  Plan: Repeat CBC in the morning.     DVT prophylaxis: Heparin 5000 units SQ 3 times daily Code Status: Currently full code, but with unfolding palliative care consult geared towards further clarification of this, as further described above. Disposition Plan: Per Rounding Team Consults called: Patient's case was discussed with critical care as well as the on-call palliative care provider, as further described above Admission status: Inpatient; PCU.   Of note, this patient was added by me to the following Admit List/Treatment Team:  mcadmits     PLEASE NOTE THAT DRAGON DICTATION SOFTWARE WAS USED IN THE CONSTRUCTION OF THIS NOTE.   Rhetta Mura DO [facility] Pager [phone] From [place]  09/24/2020, 10:51 PM

## 2020-10-09 NOTE — ED Provider Notes (Addendum)
[facility] EMERGENCY DEPARTMENT Provider Note   CSN: [id number] Arrival date & time: 09/30/2020  1639     History Chief Complaint  Patient presents with  . Altered Mental Status    Charles Schmidt is a 80 y.o. male.  HPI Patient seen by me on arrival and at 1639.  EMS states that he was found unresponsive but has since become more responsive during transport.  Oxygen saturation felt to be normal 99% on room air but he was placed on nasal cannula oxygen because of his poor responsiveness.  Patient is unable to give any history.  Level 5 caveat-altered mental status    Past Medical History:  Diagnosis Date  . Bladder diverticulum 06/06/2020  . BPH with obstruction/lower urinary tract symptoms   . Cataract, mature    12-28-2017  per pt right eye, legally blind  . Diarrhea concurrent with and due to carcinoid syndrome ([place])    12-28-2017 improvement since started Sandostatin monthly  . ED (erectile dysfunction)   . Elevated PSA   . Family history of cancer in son   . Family history of prostate cancer   . Foley catheter in place   . Glaucoma, left eye   . History of colon polyps   . History of urinary retention    2015; 2016; 01/ 2019  . Hypertension   . 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 Microcytic anemia   . RBBB (right bundle branch block with left anterior fascicular block)   . Urinary retention   . Venous reflux     Patient Active Problem List   Diagnosis Date Noted  . Protein-calorie malnutrition, severe 08/16/2020  . Intractable vomiting   . Diarrhea   . Carcinoid syndrome ([place])   . Syncope 08/14/2020  . Acute metabolic encephalopathy 32/35/5732  . CKD (chronic kidney disease) stage 3, GFR  30-59 ml/min (HCC) 08/14/2020  . Staphylococcus epidermidis bacteremia   . Pressure injury of skin 06/12/2020  . Oliguria   . Palliative care by specialist   . Goals of care, counseling/discussion   . DNR (do not resuscitate)   . Acute renal failure ([place])   . Abdominal pain   . Elevated troponin   . Lives alone 06/06/2020  . Nonspecific elevation of levels of transaminase or lactic acid dehydrogenase (LDH) 06/06/2020  . Increased ammonia level 06/06/2020  . Direct hyperbilirubinemia 06/06/2020  . Bowel wall thickening 06/06/2020  . Vomiting, persistent, in adult 06/06/2020  . Anorexia 06/06/2020  . Colitis   . Elevated liver enzymes   . Nausea and vomiting in adult   . Dehydration   . Physical debility 06/05/2020  . Genetic testing 12/18/2017  . 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 Essential hypertension (primary) 05/04/2017  . Hypokalemia 05/04/2017  . Benign prostatic hyperplasia 05/04/2017  . Glaucoma 05/04/2017  . Benign hypertensive heart disease without heart failure 05/04/2017  . Microcytic anemia 05/04/2017  . Prediabetes 05/04/2017  . Hypocalcemia 05/04/2017  . Psoriasis 05/04/2017    Past Surgical History:  Procedure Laterality Date  . INGUINAL HERNIA REPAIR  1990s   unsure side  . IR US GUIDE BX ASP/DRAIN  10/23/2017  . QUADRICEPS TENDON REPAIR Left 01-08-2011   dr Veverly Fells  . THULIUM  LASER TURP (TRANSURETHRAL RESECTION OF PROSTATE) N/A 01/05/2018   Procedure: Marcelino Duster LASER OF THE PROSTATE;  Surgeon: Festus Aloe, MD;  Location: [facility];  Service: Urology;  Laterality: N/A;  . TONSILLECTOMY  child       Family History  Problem Relation Age of Onset  . Hypertension Mother   . Stroke Father   . Prostate cancer Father        'slow growing'  . Kidney Stones Sister   . Cancer Son 47       neuroendocrine tumors of small intestine    Social History   Tobacco Use  . Smoking status: Never Smoker  .  Smokeless tobacco: Never Used  Vaping Use  . Vaping Use: Never used  Substance Use Topics  . Alcohol use: Not Currently    Alcohol/week: 1.0 standard drink    Types: 1 Standard drinks or equivalent per week    Comment: week  . Drug use: No    Home Medications Prior to Admission medications   Medication Sig Start Date End Date Taking? Authorizing Provider  acetaminophen (TYLENOL) 325 MG tablet Take 2 tablets (650 mg total) by mouth every 6 (six) hours as needed for moderate pain, fever or headache. 07/03/20   Martyn Malay, MD  cholestyramine light (PREVALITE) 4 g packet Take 4 g by mouth 2 (two) times daily.    [provider]  diphenoxylate-atropine (LOMOTIL) 2.5-0.025 MG tablet Take 1 tablet by mouth 4 (four) times daily. 07/03/20   Martyn Malay, MD  dorzolamide-timolol (COSOPT) 22.3-6.8 MG/ML ophthalmic solution Place 1 drop into the left eye 2 (two) times daily.     [provider]  finasteride (PROSCAR) 5 MG tablet Take 5 mg by mouth at bedtime.     [provider]  furosemide (LASIX) 20 MG tablet Take 20 mg by mouth daily.    [provider]  liver oil-zinc oxide (DESITIN) 40 % ointment Apply 1 application topically 2 (two) times daily at 8 am and 10 pm.    [provider]  loperamide (IMODIUM) 2 MG capsule Take 1 capsule (2 mg total) by mouth 3 (three) times daily. 08/21/20   Domenic Polite, MD  Multiple Vitamins-Minerals (CENTRUM SILVER) tablet Take 1 tablet by mouth at bedtime.    [provider]  Nystatin (GERHARDT'S BUTT CREAM) CREA Apply 1 application topically 2 (two) times daily. Apply to sacrum and scrotum Patient not taking: Reported on 09/12/2020 07/03/20   Martyn Malay, MD  octreotide (SANDOSTATIN) 100 MCG/ML SOLN injection Inject 1.5 mLs (150 mcg total) into the skin every 12 (twelve) hours. 08/21/20   Domenic Polite, MD  ondansetron (ZOFRAN) 4 MG tablet Take 1 tablet (4 mg total) by mouth every 6 (six) hours  as needed for nausea. Patient not taking: Reported on 09/12/2020 07/03/20   Martyn Malay, MD  oxyCODONE (OXY IR/ROXICODONE) 5 MG immediate release tablet Take 1 tablet (5 mg total) by mouth every 6 (six) hours as needed for moderate pain. 08/21/20   Domenic Polite, MD  potassium chloride (MICRO-K) 10 MEQ CR capsule Take 10 mEq by mouth 2 (two) times daily.    [provider]  sodium bicarbonate 650 MG tablet Take 2 tablets (1,300 mg total) by mouth 3 (three) times daily. 07/03/20   Martyn Malay, MD  tamsulosin (FLOMAX) 0.4 MG CAPS capsule Take 1 capsule (0.4 mg total) by mouth daily after supper. 07/03/20   Martyn Malay, MD  Travoprost, BAK  Free, (TRAVATAN) 0.004 % SOLN ophthalmic solution Place 1 drop into the left eye at bedtime.     [provider]    Allergies    Patient has no known allergies.  Review of Systems   Review of Systems  Unable to perform ROS: Mental status change    Physical Exam Updated Vital Signs BP 112/80   Pulse 88   Temp (!) 97.4 F (36.3 C) (Oral)   Resp 14   SpO2 97%   Physical Exam Vitals and nursing note reviewed.  Constitutional:      General: He is in acute distress (Uncomfortable).     Appearance: He is well-developed and well-nourished. He is ill-appearing. He is not toxic-appearing or diaphoretic.     Comments: Frail, elderly  HENT:     Head: Normocephalic and atraumatic.     Right Ear: External ear normal.     Left Ear: External ear normal.  Eyes:     Extraocular Movements: EOM normal.     Conjunctiva/sclera: Conjunctivae normal.     Pupils: Pupils are equal, round, and reactive to light.  Neck:     Trachea: Phonation normal.  Cardiovascular:     Rate and Rhythm: Normal rate and regular rhythm.     Heart sounds: Normal heart sounds.  Pulmonary:     Effort: Pulmonary effort is normal. No respiratory distress.     Breath sounds: No stridor. No wheezing.     Comments: Cough with bloody sputum noted during  evaluation Chest:     Chest wall: No bony tenderness.  Abdominal:     General: There is distension.     Palpations: Abdomen is soft.     Tenderness: There is abdominal tenderness (Mild diffuse).  Musculoskeletal:        General: No swelling or tenderness. Normal range of motion.     Cervical back: Normal range of motion and neck supple.  Skin:    General: Skin is warm, dry and intact.     Coloration: Skin is not jaundiced.  Neurological:     Mental Status: He is alert.     Cranial Nerves: No cranial nerve deficit.     Sensory: Sensory deficit present.     Motor: No abnormal muscle tone.     Coordination: Coordination normal.     Comments: No dysarthria.  Follows commands.  Psychiatric:        Mood and Affect: Mood and affect and mood normal.        Behavior: Behavior normal.     ED Results / Procedures / Treatments   Labs (all labs ordered are listed, but only abnormal results are displayed) Labs Reviewed  LACTIC ACID, PLASMA - Abnormal; Notable for the following components:      Result Value   Lactic Acid, Venous 8.1 (*)    All other components within normal limits  LACTIC ACID, PLASMA - Abnormal; Notable for the following components:   Lactic Acid, Venous >11.0 (*)    All other components within normal limits  PROTIME-INR - Abnormal; Notable for the following components:   Prothrombin Time 17.8 (*)    INR 1.5 (*)    All other components within normal limits  URINALYSIS, ROUTINE W REFLEX MICROSCOPIC - Abnormal; Notable for the following components:   Color, Urine AMBER (*)    APPearance TURBID (*)    Hgb urine dipstick SMALL (*)    Bilirubin Urine SMALL (*)    Protein, ur 100 (*)  Leukocytes,Ua MODERATE (*)    WBC, UA >50 (*)    Bacteria, UA MANY (*)    All other components within normal limits  CBC WITH DIFFERENTIAL/PLATELET - Abnormal; Notable for the following components:   RBC 3.56 (*)    Hemoglobin 9.3 (*)    HCT 27.5 (*)    MCV 77.2 (*)    RDW 21.6 (*)     Platelets 124 (*)    All other components within normal limits  COMPREHENSIVE METABOLIC PANEL - Abnormal; Notable for the following components:   CO2 18 (*)    Glucose, Bld 164 (*)    Creatinine, Ser 1.63 (*)    Calcium 8.0 (*)    Albumin 1.8 (*)    AST 65 (*)    Alkaline Phosphatase 198 (*)    GFR, Estimated 43 (*)    Anion gap 16 (*)    All other components within normal limits  CULTURE, BLOOD (ROUTINE X 2)  CULTURE, BLOOD (ROUTINE X 2)  URINE CULTURE  SARS CORONAVIRUS 2 (TAT 6-24 HRS)  APTT  CBC WITH DIFFERENTIAL/PLATELET  PATHOLOGIST SMEAR REVIEW    EKG None     Date: 10/03/2020    Rate: 85  Rhythm: normal sinus rhythm  QRS Axis: normal  PR and QT Intervals: normal  ST/T Wave abnormalities: normal  PR and QRS Conduction Disutrbances:none  Narrative Interpretation:   Old EKG Reviewed: unchanged     Radiology CT Abdomen Pelvis W Contrast  Result Date: 09/28/2020 CLINICAL DATA:  History of neuroendocrine tumor with possible infection EXAM: CT ABDOMEN AND PELVIS WITH CONTRAST TECHNIQUE: Multidetector CT imaging of the abdomen and pelvis was performed using the standard protocol following bolus administration of intravenous contrast. CONTRAST:  39m OMNIPAQUE IOHEXOL 300 MG/ML  SOLN COMPARISON:  08/14/2020 FINDINGS: Lower chest: Lung bases demonstrate bilateral pleural effusions right greater than left increased when compared with the prior exam. The degree of right lower lobe consolidation has also increased from the prior exam. The visualized pulmonary artery demonstrates some suggestion of filling defects within the left lower lobe pulmonary arterial branches. These are incompletely evaluated on this exam. Hepatobiliary: Liver again demonstrates changes consistent with diffuse hepatic metastatic disease. The degree of decreased attenuation within the liver has increased significantly in the interval from the prior exam consistent with progressive metastatic disease.  Scattered calcifications are again identified when compared with the prior study. Previously described central area of calcification is again identified and measures approximately 17 mm. The surrounding hepatic parenchyma however demonstrates diffuse decreased attenuation consistent with further metastatic disease. Gallbladder demonstrates dependent density likely related to milk of calcium. No biliary ductal dilatation is seen. The portal vein is attenuated but patent. This is similar to that noted on the prior exam. Pancreas: Unremarkable. No pancreatic ductal dilatation or surrounding inflammatory changes. Spleen: Normal in size without focal abnormality. Adrenals/Urinary Tract: Adrenal glands are within normal limits. The kidneys are well visualize within normal enhancement pattern bilaterally. Left renal cyst is again noted and stable. Delayed images demonstrate normal enhancement. The bladder is decompressed by Foley catheter. Stomach/Bowel: The colon is predominately decompressed the appendix is air-filled and within normal limits. Small bowel demonstrates some reactive wall thickening distally related to the associated ascites which has increased in the interval from the prior exam. No obstructive changes are seen. Vascular/Lymphatic: Atherosclerotic calcifications are seen without aneurysmal dilatation. There is again noted a partially calcified central mesenteric mass which again measures 3.5 cm in greatest dimension stable in appearance from  the prior exam. Reproductive: Prostate is again somewhat heterogeneous but within normal limits. Other: Significant ascites is noted when compared with the prior exam this has increased. Musculoskeletal: Degenerative changes of lumbar spine are noted. No acute bony abnormality is seen. IMPRESSION: Changes consistent with diffuse hepatic metastatic disease which appears to have increased in the interval from the prior exam. Increase in bilateral pleural effusions right  greater than left with associated lower lobe consolidation on the right. Findings suspicious for pulmonary emboli in the left lower lobe. CTA of the chest could be performed to evaluate although felt to be best performed after short period of delay due to the patient's mildly elevated creatinine and decreased GFR. Increase in the degree of ascites consistent with the progressive metastatic disease. Stable central mesenteric mass consistent with the given clinical history. Some increased bowel wall thickening is noted in the distal small bowel. This could be related to the underlying mesenteric mass although may simply be reactive to the increase in ascites. Electronically Signed   By: Inez Catalina M.D.   On: 10/05/2020 20:34   DG Chest Port 1 View  Result Date: 10/10/2020 CLINICAL DATA:  Found unresponsive, possible sepsis EXAM: PORTABLE CHEST 1 VIEW COMPARISON:  08/14/2020 FINDINGS: Cardiac shadow is within normal limits. Increased density is noted in the right hemithorax likely related to a posteriorly layering effusion. No definitive underlying infiltrate is seen. Left lung is clear. No bony abnormality is noted. IMPRESSION: Right-sided pleural effusion. No definitive infiltrate is seen although may be obscured in the lower lobe by the effusion. Electronically Signed   By: Inez Catalina M.D.   On: 10/18/2020 17:22    Procedures .Critical Care Performed by: Daleen Bo, MD Authorized by: Daleen Bo, MD   Critical care provider statement:    Critical care time (minutes):  65   Critical care start time:  10/10/2020 4:39 PM   Critical care end time:  10/04/2020 10:27 PM   Critical care time was exclusive of:  Separately billable procedures and treating other patients   Critical care was necessary to treat or prevent imminent or life-threatening deterioration of the following conditions:  Sepsis   Critical care was time spent personally by me on the following activities:  Blood draw for  specimens, development of treatment plan with patient or surrogate, discussions with consultants, evaluation of patient's response to treatment, examination of patient, obtaining history from patient or surrogate, ordering and performing treatments and interventions, ordering and review of laboratory studies, pulse oximetry, re-evaluation of patient's condition, review of old charts and ordering and review of radiographic studies   (including critical care time)  Medications Ordered in ED Medications  lactated ringers infusion ( Intravenous New Bag/Given 10/01/2020 2032)  fentaNYL (SUBLIMAZE) injection 50 mcg (50 mcg Intravenous Given 10/13/2020 1937)  ceFEPIme (MAXIPIME) 2 g in sodium chloride 0.9 % 100 mL IVPB (has no administration in time range)  lactated ringers bolus 1,000 mL (0 mLs Intravenous Stopped 10/10/2020 1758)    And  lactated ringers bolus 1,000 mL (0 mLs Intravenous Stopped 09/24/2020 1907)    And  lactated ringers bolus 1,000 mL (0 mLs Intravenous Stopped 10/01/2020 1926)  ceFEPIme (MAXIPIME) 2 g in sodium chloride 0.9 % 100 mL IVPB (0 g Intravenous Stopped 10/19/2020 1800)  iohexol (OMNIPAQUE) 300 MG/ML solution 75 mL (75 mLs Intravenous Contrast Given 10/12/2020 1952)    ED Course  I have reviewed the triage vital signs and the nursing notes.  Pertinent labs & imaging results that  were available during my care of the patient were reviewed by me and considered in my medical decision making (see chart for details).  Clinical Course as of 09/27/2020 2242  Tue Oct 09, 2020  1710 Patient now states "I hurt all over and I want to die."  Son in room and is concerned about "fragile he is."  Son states patient is recovering from [place], and was supposed to get out of isolation after 14 days, being managed at his SNF. 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 [EW]    Clinical Course User Index [EW] Daleen Bo, MD   MDM Rules/Calculators/A&P                            Patient Vitals for the past 24 hrs:  BP Temp Temp src Pulse Resp SpO2  09/27/2020 2215 112/80 -- -- 88 14 97 %  09/25/2020 2203 107/78 -- -- 87 18 96 %  10/07/2020 2130 109/80 -- -- 90 14 95 %  09/24/2020 2115 109/82 -- -- 90 17 95 %  10/06/2020 2100 109/80 -- -- 88 19 93 %  10/11/2020 2045 129/80 -- -- 90 16 93 %  10/22/2020 2030 106/75 -- -- 93 15 94 %  10/03/2020 2015 120/83 -- -- 94 16 93 %  10/03/2020 1930 104/74 -- -- 82 14 97 %  09/25/2020 1915 110/78 -- -- 86 18 96 %  10/13/2020 1830 91/66 -- -- 83 18 95 %  10/14/2020 1800 (!) 85/68 -- -- 78 (!) 22 93 %  09/28/2020 1730 (!) 85/70 -- -- 78 (!) 22 96 %  10/08/2020 1702 94/70 (!) 97.4 F (36.3 C) Oral 77 (!) 22 95 %  10/04/2020 1651 -- -- -- -- -- 99 %    10:14 PM Reevaluation with update and discussion. After initial assessment and treatment, an updated evaluation reveals he continues to look more comfortable than on arrival.  Blood pressure improved.  Patient has been spitting up some mucus.  Findings discussed with patient and son, all questions answered. Daleen Bo   Medical Decision Making:  This patient is presenting for evaluation of altered mental status, while recovering from COVID infection, which does require a range of treatment options, and is a complaint that involves a high risk of morbidity and mortality. The differential diagnoses include Acute illnesses including urinary tract infection, pneumonia, metabolic disorder and volume depletion. I decided to review old records, and in summary elderly male, with history of metastatic carcinoid tumor to the the liver, presenting from his care facility.  During recent hospitalization he was seen by palliative care.  He has apparently had DNR status in the past but currently is full code. I obtained additional information from his son [name], who is at the bedside with the patient.  Clinical Laboratory Tests Ordered, included CBC, Metabolic panel, Urinalysis and Lactate, COVID  test, flu test. Review indicates abnormal urine consistent with UTI.  CO2 low, glucose high, creatinine high, calcium low, alk phos stays high, AST high, anion gap low, hemoglobin low, MCV low, platelets low Radiologic Tests Ordered, included chest x-ray, CT abdomen pelvis.  I independently Visualized: Radiologic images, which show persistent right-sided pulmonary effusion without significant consolidation.  CT abdomen pelvis consistent with worsening metastatic disease and possible left lower lobe pulmonary emboli, per radiologist report  Cardiac Monitor Tracing which shows sinus tachycardia    Critical Interventions-clinical evaluation,  laboratory testing, radiologic imaging, sepsis dose doses of IV fluid, empiric antibiotic for UTI, observation reassessment  After These Interventions, the Patient was reevaluated and was found with climbing lactate despite high-volume saline boluses.  Suspect this is related to combination of worsening metastatic cancer as well as mild AKI.  Doubt severe sepsis, blood pressure improved with IV fluid boluses.  Consultation with intensivist.  Plan admission either to intensivist or hospitalist based on agreements.  The patient is noted to have a lactate>4. With the current information available to me, I don't think the patient is in septic shock. The lactate>4, is related to OTHER SHOCK Metastatic cancer.  CRITICAL CARE-yes Performed by: Daleen Bo  Nursing Notes Reviewed/ Care Coordinated Applicable Imaging Reviewed Interpretation of Laboratory Data incorporated into ED treatment  10:15 PM-requested callback from intensivist-case discussed with Dr. Lorenso Courier who agrees with hospitalist admission for this patient, at this time.  He does not suggest any different treatment course.   10:18 PM-Consult complete with hospitalist. Patient case explained and discussed.  He agrees to admit patient for further evaluation and treatment. Call ended at 1046   Final  Clinical Impression(s) / ED Diagnoses Final diagnoses:  Urinary tract infection without hematuria, site unspecified  Sepsis with acute renal failure without septic shock, due to unspecified organism, unspecified acute renal failure type [facility])  Metastatic malignant neoplasm, unspecified site [facility])    Rx / DC Orders ED Discharge Orders    None         Daleen Bo, MD 10/04/2020 2248

## 2020-10-09 NOTE — ED Notes (Signed)
Patient transported to CT 

## 2020-10-09 NOTE — ED Notes (Signed)
Asked phlebotomist for help with blood draw

## 2020-10-09 NOTE — ED Triage Notes (Signed)
BIB EMS from [facility]. Pt was found slump over, unresponsive in his wheelchair. When EMS got there pt was alert but altered. Pt vomited multiple times. Pt in the process of being moved from the COVID unit to a regular bed.

## 2020-10-09 NOTE — Progress Notes (Signed)
Pharmacy Antibiotic Note  Charles Schmidt is a 80 y.o. male admitted on 10/18/2020 with sepsis secondary to suspected UTI.  Pharmacy has been consulted for cefepime dosing. PMH significant for metastatic carcinoid tumor, BPH, HTN.  Estimated weight ~92 kg. Scr 1.63 (baseline ~1), estimated CrCl ~45 ml/min. Lactic acid 8.1, WBC 4.9, afebrile.  Plan: Cefepime 2 grams x1 then 2g q12h Monitor cultures, renal function, labs, clinical progression    Temp (24hrs), Avg:97.4 F (36.3 C), Min:97.4 F (36.3 C), Max:97.4 F (36.3 C)  Recent Labs  Lab 09/27/2020 1708 10/13/2020 1755  WBC  --  4.9  CREATININE  --  1.63*  LATICACIDVEN 8.1*  --      No Known Allergies  Antimicrobials this admission: Cefepime 1/18 >>   Dose adjustments this admission:  Microbiology results: 1/18 BCx: sent 1/18 UCx: sent  1/18 UA: sent  Thank you for allowing pharmacy to be a part of this patient's care.  Fara Olden, PharmD PGY-1 Pharmacy Resident 10/04/2020 7:46 PM Please see AMION for all pharmacy numbers

## 2020-10-09 NOTE — Sepsis Progress Note (Addendum)
Sepsis protocol being followed by [facility]. Pt is a difficult stick.

## 2020-10-09 NOTE — ED Notes (Signed)
78mcg fentanyl given per pt request due to generalized pain

## 2020-10-09 NOTE — Sepsis Progress Note (Signed)
Notified bedside nurse of need to administer fluid bolus.  

## 2020-10-10 ENCOUNTER — Inpatient Hospital Stay (HOSPITAL_COMMUNITY): Payer: Medicare Other

## 2020-10-10 ENCOUNTER — Encounter (HOSPITAL_COMMUNITY): Payer: Self-pay | Admitting: Internal Medicine

## 2020-10-10 DIAGNOSIS — I2699 Other pulmonary embolism without acute cor pulmonale: Secondary | ICD-10-CM

## 2020-10-10 DIAGNOSIS — C799 Secondary malignant neoplasm of unspecified site: Secondary | ICD-10-CM

## 2020-10-10 DIAGNOSIS — E872 Acidosis, unspecified: Secondary | ICD-10-CM | POA: Diagnosis present

## 2020-10-10 DIAGNOSIS — I959 Hypotension, unspecified: Secondary | ICD-10-CM | POA: Diagnosis present

## 2020-10-10 DIAGNOSIS — N390 Urinary tract infection, site not specified: Secondary | ICD-10-CM | POA: Diagnosis not present

## 2020-10-10 DIAGNOSIS — U071 COVID-19: Principal | ICD-10-CM

## 2020-10-10 DIAGNOSIS — A419 Sepsis, unspecified organism: Secondary | ICD-10-CM

## 2020-10-10 DIAGNOSIS — G9341 Metabolic encephalopathy: Secondary | ICD-10-CM | POA: Diagnosis not present

## 2020-10-10 DIAGNOSIS — M7989 Other specified soft tissue disorders: Secondary | ICD-10-CM

## 2020-10-10 DIAGNOSIS — N179 Acute kidney failure, unspecified: Secondary | ICD-10-CM | POA: Diagnosis not present

## 2020-10-10 DIAGNOSIS — Z7189 Other specified counseling: Secondary | ICD-10-CM

## 2020-10-10 DIAGNOSIS — R0602 Shortness of breath: Secondary | ICD-10-CM

## 2020-10-10 DIAGNOSIS — R7989 Other specified abnormal findings of blood chemistry: Secondary | ICD-10-CM

## 2020-10-10 DIAGNOSIS — Z515 Encounter for palliative care: Secondary | ICD-10-CM

## 2020-10-10 DIAGNOSIS — Z789 Other specified health status: Secondary | ICD-10-CM

## 2020-10-10 DIAGNOSIS — R6520 Severe sepsis without septic shock: Secondary | ICD-10-CM

## 2020-10-10 DIAGNOSIS — Z66 Do not resuscitate: Secondary | ICD-10-CM

## 2020-10-10 LAB — COMPREHENSIVE METABOLIC PANEL
ALT: 54 U/L — ABNORMAL HIGH (ref 0–44)
AST: 537 U/L — ABNORMAL HIGH (ref 15–41)
Albumin: 1.6 g/dL — ABNORMAL LOW (ref 3.5–5.0)
Alkaline Phosphatase: 175 U/L — ABNORMAL HIGH (ref 38–126)
Anion gap: 15 (ref 5–15)
BUN: 18 mg/dL (ref 8–23)
CO2: 18 mmol/L — ABNORMAL LOW (ref 22–32)
Calcium: 8 mg/dL — ABNORMAL LOW (ref 8.9–10.3)
Chloride: 107 mmol/L (ref 98–111)
Creatinine, Ser: 1.55 mg/dL — ABNORMAL HIGH (ref 0.61–1.24)
GFR, Estimated: 45 mL/min — ABNORMAL LOW (ref >60)
Glucose, Bld: 105 mg/dL — ABNORMAL HIGH (ref 70–99)
Potassium: 4.4 mmol/L (ref 3.5–5.1)
Sodium: 140 mmol/L (ref 135–145)
Total Bilirubin: 1.6 mg/dL — ABNORMAL HIGH (ref 0.3–1.2)
Total Protein: 5.9 g/dL — ABNORMAL LOW (ref 6.5–8.1)

## 2020-10-10 LAB — CBC WITH DIFFERENTIAL/PLATELET
Abs Immature Granulocytes: 0.03 10*3/uL (ref 0.00–0.07)
Basophils Absolute: 0 10*3/uL (ref 0.0–0.1)
Basophils Relative: 0 %
Eosinophils Absolute: 0 10*3/uL (ref 0.0–0.5)
Eosinophils Relative: 0 %
HCT: 29.5 % — ABNORMAL LOW (ref 39.0–52.0)
Hemoglobin: 9.8 g/dL — ABNORMAL LOW (ref 13.0–17.0)
Immature Granulocytes: 0 %
Lymphocytes Relative: 7 %
Lymphs Abs: 0.5 10*3/uL — ABNORMAL LOW (ref 0.7–4.0)
MCH: 25.1 pg — ABNORMAL LOW (ref 26.0–34.0)
MCHC: 33.2 g/dL (ref 30.0–36.0)
MCV: 75.4 fL — ABNORMAL LOW (ref 80.0–100.0)
Monocytes Absolute: 0.4 10*3/uL (ref 0.1–1.0)
Monocytes Relative: 6 %
Neutro Abs: 6.1 10*3/uL (ref 1.7–7.7)
Neutrophils Relative %: 87 %
Platelets: 122 10*3/uL — ABNORMAL LOW (ref 150–400)
RBC: 3.91 MIL/uL — ABNORMAL LOW (ref 4.22–5.81)
RDW: 22.6 % — ABNORMAL HIGH (ref 11.5–15.5)
WBC: 7 10*3/uL (ref 4.0–10.5)
nRBC: 0 % (ref 0.0–0.2)

## 2020-10-10 LAB — I-STAT VENOUS BLOOD GAS, ED
Acid-base deficit: 4 mmol/L — ABNORMAL HIGH (ref 0.0–2.0)
Bicarbonate: 21.3 mmol/L (ref 20.0–28.0)
Calcium, Ion: 0.85 mmol/L — CL (ref 1.15–1.40)
HCT: 34 % — ABNORMAL LOW (ref 39.0–52.0)
Hemoglobin: 11.6 g/dL — ABNORMAL LOW (ref 13.0–17.0)
O2 Saturation: 99 %
Patient temperature: 98
Potassium: >8.5 mmol/L (ref 3.5–5.1)
Sodium: 136 mmol/L (ref 135–145)
TCO2: 23 mmol/L (ref 22–32)
pCO2, Ven: 39.9 mmHg — ABNORMAL LOW (ref 44.0–60.0)
pH, Ven: 7.334 (ref 7.250–7.430)
pO2, Ven: 126 mmHg — ABNORMAL HIGH (ref 32.0–45.0)

## 2020-10-10 LAB — TSH: TSH: 2.509 u[IU]/mL (ref 0.350–4.500)

## 2020-10-10 LAB — PROTIME-INR
INR: 1.7 — ABNORMAL HIGH (ref 0.8–1.2)
Prothrombin Time: 19.7 seconds — ABNORMAL HIGH (ref 11.4–15.2)

## 2020-10-10 LAB — LACTATE DEHYDROGENASE: LDH: 1359 U/L — ABNORMAL HIGH (ref 98–192)

## 2020-10-10 LAB — HEPARIN LEVEL (UNFRACTIONATED): Heparin Unfractionated: <0.1 IU/mL — ABNORMAL LOW (ref 0.30–0.70)

## 2020-10-10 LAB — CBG MONITORING, ED: Glucose-Capillary: 65 mg/dL — ABNORMAL LOW (ref 70–99)

## 2020-10-10 LAB — PHOSPHORUS: Phosphorus: 4.6 mg/dL (ref 2.5–4.6)

## 2020-10-10 LAB — FERRITIN: Ferritin: >7500 ng/mL — ABNORMAL HIGH (ref 24–336)

## 2020-10-10 LAB — C-REACTIVE PROTEIN: CRP: 0.5 mg/dL (ref <1.0)

## 2020-10-10 LAB — FIBRINOGEN: Fibrinogen: 115 mg/dL — ABNORMAL LOW (ref 210–475)

## 2020-10-10 LAB — PROCALCITONIN: Procalcitonin: 4.78 ng/mL

## 2020-10-10 LAB — SARS CORONAVIRUS 2 (TAT 6-24 HRS): SARS Coronavirus 2: POSITIVE — AB

## 2020-10-10 LAB — LACTIC ACID, PLASMA: Lactic Acid, Venous: 7.5 mmol/L (ref 0.5–1.9)

## 2020-10-10 LAB — MAGNESIUM: Magnesium: 1.8 mg/dL (ref 1.7–2.4)

## 2020-10-10 LAB — AMMONIA: Ammonia: 71 umol/L — ABNORMAL HIGH (ref 9–35)

## 2020-10-10 MED ORDER — MIDODRINE HCL 5 MG PO TABS
10.0000 mg | ORAL_TABLET | Freq: Three times a day (TID) | ORAL | Status: DC
  Administered 2020-10-10 (×2): 10 mg via ORAL
  Filled 2020-10-10 (×5): qty 2

## 2020-10-10 MED ORDER — PANTOPRAZOLE SODIUM 40 MG PO TBEC
40.0000 mg | DELAYED_RELEASE_TABLET | Freq: Every day | ORAL | Status: DC
  Administered 2020-10-10: 40 mg via ORAL
  Filled 2020-10-10 (×2): qty 1

## 2020-10-10 MED ORDER — SODIUM CHLORIDE 0.9 % IV SOLN: 100.0000 mg | Freq: Every day | INTRAVENOUS | Status: DC

## 2020-10-10 MED ORDER — LOPERAMIDE HCL 2 MG PO CAPS
2.0000 mg | ORAL_CAPSULE | Freq: Three times a day (TID) | ORAL | Status: DC
  Administered 2020-10-10 (×3): 2 mg via ORAL
  Filled 2020-10-10 (×4): qty 1

## 2020-10-10 MED ORDER — SODIUM CHLORIDE 0.9 % IV SOLN
200.0000 mg | Freq: Once | INTRAVENOUS | Status: AC
  Administered 2020-10-10: 200 mg via INTRAVENOUS
  Filled 2020-10-10: qty 40

## 2020-10-10 MED ORDER — FINASTERIDE 5 MG PO TABS
5.0000 mg | ORAL_TABLET | Freq: Every day | ORAL | Status: DC
  Administered 2020-10-10: 5 mg via ORAL
  Filled 2020-10-10: qty 1

## 2020-10-10 MED ORDER — HEPARIN SODIUM (PORCINE) 5000 UNIT/ML IJ SOLN
5000.0000 [IU] | Freq: Three times a day (TID) | INTRAMUSCULAR | Status: DC
  Filled 2020-10-10: qty 1

## 2020-10-10 MED ORDER — LACTATED RINGERS IV BOLUS
1000.0000 mL | Freq: Once | INTRAVENOUS | Status: AC
  Administered 2020-10-10: 1000 mL via INTRAVENOUS

## 2020-10-10 MED ORDER — SODIUM CHLORIDE 0.9% FLUSH: 10.0000 mL | INTRAVENOUS | Status: DC | PRN

## 2020-10-10 MED ORDER — MAGNESIUM SULFATE 2 GM/50ML IV SOLN
2.0000 g | Freq: Once | INTRAVENOUS | Status: AC
  Administered 2020-10-10: 2 g via INTRAVENOUS
  Filled 2020-10-10: qty 50

## 2020-10-10 MED ORDER — HEPARIN (PORCINE) 25000 UT/250ML-% IV SOLN
2300.0000 [IU]/h | INTRAVENOUS | Status: DC
  Administered 2020-10-10 – 2020-10-11 (×3): 1500 [IU]/h via INTRAVENOUS
  Filled 2020-10-10 (×4): qty 250

## 2020-10-10 MED ORDER — SODIUM CHLORIDE 0.9% FLUSH
10.0000 mL | Freq: Two times a day (BID) | INTRAVENOUS | Status: DC
  Administered 2020-10-11: 10 mL
  Administered 2020-10-11: 20 mL

## 2020-10-10 MED ORDER — OCTREOTIDE ACETATE 100 MCG/ML IJ SOLN
150.0000 ug | Freq: Two times a day (BID) | INTRAMUSCULAR | Status: DC
  Administered 2020-10-10 – 2020-10-15 (×10): 150 ug via SUBCUTANEOUS
  Filled 2020-10-10 (×14): qty 1.5

## 2020-10-10 MED ORDER — HEPARIN BOLUS VIA INFUSION
5000.0000 [IU] | Freq: Once | INTRAVENOUS | Status: AC
  Administered 2020-10-10: 5000 [IU] via INTRAVENOUS
  Filled 2020-10-10: qty 5000

## 2020-10-10 MED ORDER — TAMSULOSIN HCL 0.4 MG PO CAPS
0.4000 mg | ORAL_CAPSULE | Freq: Every day | ORAL | Status: DC
  Administered 2020-10-10: 0.4 mg via ORAL
  Filled 2020-10-10: qty 1

## 2020-10-10 NOTE — ED Notes (Signed)
Lab called about light green top being grossly hemolyzed, this RN able to straight stick pt and will send off 2 light green tops, provider made aware.

## 2020-10-10 NOTE — ED Notes (Signed)
Spoke to Son related to Attendings conversation

## 2020-10-10 NOTE — ED Notes (Signed)
Pt IV was kinked and came out due to re-bending elbow over and over. @ attempts made to reestablish IV. Calling IV team

## 2020-10-10 NOTE — Progress Notes (Signed)
ANTICOAGULATION CONSULT NOTE - Initial Consult  Pharmacy Consult for heparin Indication: pulmonary embolus  No Known Allergies  Patient Measurements: Height: 6\' 4"  (193 cm) Weight: 92 kg (202 lb 12.8 oz) IBW/kg (Calculated) : 86.8 Heparin Dosing Weight: 92kg  Vital Signs: BP: 86/63 (01/19 0830) Pulse Rate: 96 (01/19 0830)  Labs: Recent Labs    09/26/2020 1644 10/02/2020 1755 09/30/2020 1755 10/10/20 0213 10/10/20 0230 10/10/20 0418  HGB  --  9.3*   < > 11.6* 9.8*  --   HCT  --  27.5*  --  34.0* 29.5*  --   PLT  --  124*  --   --  122*  --   APTT 31  --   --   --   --   --   LABPROT 17.8*  --   --   --  19.7*  --   INR 1.5*  --   --   --  1.7*  --   CREATININE  --  1.63*  --   --   --  1.55*   < > = values in this interval not displayed.    Estimated Creatinine Clearance: 47.4 mL/min (A) (by C-G formula based on SCr of 1.55 mg/dL (H)).   Medical History: Past Medical History:  Diagnosis Date  . Bladder diverticulum 06/06/2020  . BPH with obstruction/lower urinary tract symptoms   . Cataract, mature    12-28-2017  per pt right eye, legally blind  . Diarrhea concurrent with and due to carcinoid syndrome ([place])    12-28-2017 improvement since started Sandostatin monthly  . ED (erectile dysfunction)   . Elevated PSA   . Family history of cancer in son   . Family history of prostate cancer   . Foley catheter in place   . Glaucoma, left eye   . History of colon polyps   . History of urinary retention    2015; 2016; 01/ 2019  . Hypertension   . Metastatic malignant neuroendocrine tumor to liver [facility]) oncoloigst-  dr Benay Spice   dx 10-23-2017 by liver bx-- WHO grade 2; small bowel mesenteric mass, cirrhosis, mets left lower lobe solitary nodule ,  periaortic and paraspinal lymph nodes METS--  started monthly Sandostatin 11-20-2017  . Microcytic anemia   . RBBB (right bundle branch block with left anterior fascicular block)   . Urinary retention   . Venous reflux      Medications:  Infusions:  . ceFEPime (MAXIPIME) IV Stopped (10/10/20 0437)  . heparin    . lactated ringers    . lactated ringers 150 mL/hr at 09/28/2020 2032  . [START ON 10/11/2020] remdesivir 100 mg in NS 100 mL     Assessment: 58 yom presented to the ED with AMS. Found to have a PE and now starting IV heparin. Baseline Hgb is low at 9.8 and platelets are also low. INR is elevated but likely related to liver disease. He is not on anticoagulation PTA.   Goal of Therapy:  Heparin level 0.3-0.7 units/ml Monitor platelets by anticoagulation protocol: Yes   Plan:  Heparin bolus 5000 units IV x 1 Heparin gtt 1500 units/hr Check an 8 hr heparin level Daily heparin level and CBC Will watch platelets closely  Madysun Thall, Rande Lawman 10/10/2020,9:58 AM

## 2020-10-10 NOTE — Progress Notes (Signed)
Lower extremity venous bilateral study completed.  Preliminary results relayed to Sloan Leiter, MD.   See CV Proc for preliminary results report.   Darlin Coco, RDMS

## 2020-10-10 NOTE — ED Notes (Signed)
Venous blood gas walked down to minilab.

## 2020-10-10 NOTE — Progress Notes (Signed)
PROGRESS NOTE                                                                                                                                                                                                             Patient Demographics:    Charles Schmidt, is a 80 y.o. male, DOB - [birth year], SEG:[id number]  Outpatient Primary MD for the patient is Charles Ebbs, MD   Admit date - 09/25/2020   LOS - 1  Chief Complaint  Patient presents with  . Altered Mental Status       Brief Narrative: Patient is a 80 y.o. male with PMHx of metastatic carcinoid tumor, diarrhea related to carcinoid, BPH-with indwelling chronic Foley catheter, recurrent UTI-resident of SNF-who apparently was COVID-positive approximately 2 weeks back (was in isolation at SNF)-presenting with altered mental status-found to have acute metabolic encephalopathy in the context of COVID 19 infection/bacterial pneumonia and complicated UTI.  COVID-19 vaccinated status:   Significant Events: 1/18>> Admit to [facility] for metabolic encephalopathy-complicated UTI, [id number] infection.  Hypotension requiring multiple fluid boluses. 1/19>> persistent hypotension/worsening hypoxia-started on midodrine-IV fluid boluses-on 3-4 L of O2.  Significant studies: 1/18>> CT abdomen/pelvis with contrast: Diffuse hepatic metastatic disease-increased compared to prior exam.  Increased bilateral pleural effusions-with RLL>> LLL consolidation.  Suspicious pulmonary emboli in left lower lobe.  Stable central mesenteric mass consistent with history of carcinoid.  Increase in degree of ascites. 1/18>> chest x-ray: Right-sided pleural effusion, no definite infiltrate seen. 1/19>> bilateral lower extremity Doppler:+ve DVT (prelim-discussed with technician)  COVID-19 medications: Remdesivir: 1/18>>  Antibiotics: Cefepime: 1/18>>  Microbiology data: 1/18 >>blood culture: No  growth 1/18>> urine culture: Pending 08/14/20>> urine culture: Pansensitive Pseudomonas  Procedures: None  Consults: Palliative care, oncology  DVT prophylaxis: heparin bolus via infusion 5,000 Units Start: 10/10/20 1000    Subjective:    Charles Schmidt today remains very lethargic-but arouses when answer some questions appropriately.  He looks very emaciated/chronically ill-and at times is asking if "is he going to die"   Assessment  & Plan :   Acute metabolic encephalopathy: Multifactorial etiology-secondary to complicated UTI, hypotension/hypoxia.  Lethargic but nonfocal-continue supportive care.  Complicated UTI: Has chronic indwelling Foley catheter likely contributing to recurrent UTIs.  Prior urine culture positive for Pseudomonas.  Continue cefepime-await cultures.  AKI on CKD stage IIIa: AKI likely hemodynamically mediated due to  COVID-19 infection/poor oral intake, UTI and probably diarrhea related to carcinoid.  Hydrate-and follow electrolytes.  Avoid nephrotoxic agents.  Acute hypoxic respiratory failure due to pulmonary embolism, HCAP/possible COVID-19 pneumonia and pleural effusion: On 3-4 L of oxygen this morning-multifactorial etiology as noted above-on Remdesivir/cefepime-starting IV heparin.  PE/bilateral lower extremity DVT: Provoked by COVID-19-poor functional status-underlying malignancy.  He has liver metastases-and INR is already at 1.7.  He appears incredibly frail-debilitated-underlying prognosis appears very poor-we will start IV heparin.  Discussed with oncology-we both agree given his incredibly frail state-that we limit treatment to IV anticoagulation and not consider thrombolytic therapy.  Hypotension: Suspect this is multifactorial-probably related to hypovolemia-related to diarrhea due to carcinoid syndrome-not sure if this hypotension is related to his VTE-plans are for gentle medical treatment without significant escalation in care given his profound  frailty/debility-and tenuous state.  Continue IV fluids-added midodrine-started on IV heparin infusion for VTE.  Checking random cortisol level.  COVID-19 infection: No significant groundglass opacities/COVID-19 related changes seen on imaging studies.  Since this has been ongoing for 2 weeks-not sure if Remdesivir has any role-hence will stop Remdesivir.  Furthermore-his AST is significantly elevated at 537.  Supportive care for now.  History of metastatic carcinoid: Discussed with patient's oncologist-has had significant issues with debility/multiple hospitalizations-but had recently improved somewhat-now back in the hospital with above noted issues.  Dr.Sherrill will evaluate either later today or tomorrow-however he does agree that given patient's multiple comorbid issues-now with acute illness-that patient should be a no CODE BLUE.  Have restarted octreotide injection.  Chronic urinary retention-chronic indwelling Foley catheter-BPH: Foley catheter in place-resume Flomax and finasteride.  Palliative care: Incredibly frail-debilitated-SNF resident-with metastatic carcinoid-now with COVID infection resulting in PE/DVT-hypotensive-hypoxic-given his frailty (albumin of 1.6!!)-tenuous clinical status-Long discussion with patient's son-Charles Schmidt over the phone this morning-explained that if patient continues to deteriorate-he will not likely survive this hospitalization.  Given his very poor functional status-severe frailty/debility-do not think that he would benefit from critical care/ICU care-Charles Schmidt understood-patient now [facility] of care are to continue with gentle medical treatment-without any further escalation in care.  Have consulted palliative care-spoke with Luetta Nutting Smith-NP-who will see patient shortly and provide further goals of care.      Component Value Date/Time   BNP 82.4 08/14/2020 1020    GI prophylaxis: PPI  ABG:    Component Value Date/Time   HCO3 21.3 10/10/2020 0213   TCO2 23  10/10/2020 0213   ACIDBASEDEF 4.0 (H) 10/10/2020 0213   O2SAT 99.0 10/10/2020 0213    Vent Settings: N/A   Condition - Extremely Guarded  Family Communication  : Son-Charles Schmidt-[phone]-updated over the phone 1/19  Code Status :  DNR  Diet :  Diet Order    None       Disposition Plan  :   Status is: Inpatient  Remains inpatient appropriate because:Inpatient level of care appropriate due to severity of illness   Dispo: The patient is from: SNF              Anticipated d/c is to: TBD              Anticipated d/c date is: > 3 days              Patient currently is not medically stable to d/c.         Barriers to discharge: Hypoxia requiring O2 supplementation/complete 5 days of IV Remdesivir  Antimicorbials  :    Anti-infectives (From admission, onward)   Start  Dose/Rate Route Frequency Ordered Stop   10/11/20 1000  remdesivir 100 mg in sodium chloride 0.9 % 100 mL IVPB       "Followed by" Linked Group Details   100 mg 200 mL/hr over 30 Minutes Intravenous Daily 10/10/20 0123 11/01/2020 0959   10/10/20 0500  ceFEPIme (MAXIPIME) 2 g in sodium chloride 0.9 % 100 mL IVPB        2 g 200 mL/hr over 30 Minutes Intravenous Every 12 hours 10/14/2020 1947     10/10/20 0200  remdesivir 200 mg in sodium chloride 0.9% 250 mL IVPB       "Followed by" Linked Group Details   200 mg 580 mL/hr over 30 Minutes Intravenous Once 10/10/20 0123 10/10/20 0323   09/24/2020 1700  ceFEPIme (MAXIPIME) 2 g in sodium chloride 0.9 % 100 mL IVPB        2 g 200 mL/hr over 30 Minutes Intravenous  Once 10/11/2020 1651 10/12/2020 1800      Inpatient Medications  Scheduled Meds: . finasteride  5 mg Oral QHS  . heparin  5,000 Units Intravenous Once  . loperamide  2 mg Oral TID  . midodrine  10 mg Oral TID WC  . octreotide  150 mcg Subcutaneous BID  . tamsulosin  0.4 mg Oral QPC supper   Continuous Infusions: . ceFEPime (MAXIPIME) IV Stopped (10/10/20 0437)  . heparin    . lactated ringers     . lactated ringers 150 mL/hr at 10/20/2020 2032  . [START ON 10/11/2020] remdesivir 100 mg in NS 100 mL     PRN Meds:.   Time Spent in minutes  45  See all Orders from today for further details   Oren Binet M.D on 10/10/2020 at 10:55 AM  To page go to [email or web address] - use universal password  [facility] -  Office  [phone]    Objective:   Vitals:   10/10/20 0715 10/10/20 0745 10/10/20 0830 10/10/20 0900  BP: 92/65 (!) 88/66 (!) 86/63   Pulse: (!) 102 95 96   Resp: (!) 24 19 (!) 28   Temp:      TempSrc:      SpO2: 95% 95% 90%   Weight:    92 kg  Height:    6\' 4"  (1.93 m)    Wt Readings from Last 3 Encounters:  10/10/20 92 kg  08/31/20 92 kg  08/21/20 97.2 kg    No intake or output data in the 24 hours ending 10/10/20 1055   Physical Exam Gen Exam: Lethargic-incredibly frail-appears very debilitated. HEENT:atraumatic, normocephalic Chest: B/L clear to auscultation anteriorly CVS:S1S2 regular Abdomen:soft non tender, non distended Extremities:no edema Neurology: Generalized weakness but seems to be moving all 4 extremities. Skin: no rash   Data Review:    CBC Recent Labs  Lab 10/14/2020 1755 10/10/20 0213 10/10/20 0230  WBC 4.9  --  7.0  HGB 9.3* 11.6* 9.8*  HCT 27.5* 34.0* 29.5*  PLT 124*  --  122*  MCV 77.2*  --  75.4*  MCH 26.1  --  25.1*  MCHC 33.8  --  33.2  RDW 21.6*  --  22.6*  LYMPHSABS 1.1  --  0.5*  MONOABS 0.2  --  0.4  EOSABS 0.0  --  0.0  BASOSABS 0.0  --  0.0    Chemistries  Recent Labs  Lab 10/13/2020 1755 10/10/20 0213 10/10/20 0418  NA 141 136 140  K 4.2 >8.5* 4.4  CL 107  --  107  CO2 18*  --  18*  GLUCOSE 164*  --  105*  BUN 18  --  18  CREATININE 1.63*  --  1.55*  CALCIUM 8.0*  --  8.0*  MG  --   --  1.8  AST 65*  --  537*  ALT 23  --  54*  ALKPHOS 198*  --  175*  BILITOT 1.1  --  1.6*    ------------------------------------------------------------------------------------------------------------------ No results for input(s): CHOL, HDL, LDLCALC, TRIG, CHOLHDL, LDLDIRECT in the last 72 hours.  No results found for: HGBA1C ------------------------------------------------------------------------------------------------------------------ Recent Labs    10/10/20 0230  TSH 2.509   ------------------------------------------------------------------------------------------------------------------ Recent Labs    10/10/20 0230  FERRITIN >7,500*    Coagulation profile Recent Labs  Lab 10/18/2020 1644 10/10/20 0230  INR 1.5* 1.7*    No results for input(s): DDIMER in the last 72 hours.  Cardiac Enzymes No results for input(s): CKMB, TROPONINI, MYOGLOBIN in the last 168 hours.  Invalid input(s): CK ------------------------------------------------------------------------------------------------------------------    Component Value Date/Time   BNP 82.4 08/14/2020 1020    Micro Results Recent Results (from the past 240 hour(s))  Blood Culture (routine x 2)     Status: None (Preliminary result)   Collection Time: 10/22/2020  5:20 PM   Specimen: BLOOD RIGHT ARM  Result Value Ref Range Status   Specimen Description BLOOD RIGHT ARM  Final   Special Requests   Final    BOTTLES DRAWN AEROBIC AND ANAEROBIC Blood Culture adequate volume   Culture   Final    NO GROWTH < 12 HOURS Performed at [facility], [place] [address] [place], [place] [address]    Report Status PENDING  Incomplete  Blood Culture (routine x 2)     Status: None (Preliminary result)   Collection Time: 10/11/2020  5:30 PM   Specimen: BLOOD RIGHT ARM  Result Value Ref Range Status   Specimen Description BLOOD RIGHT ARM  Final   Special Requests   Final    BOTTLES DRAWN AEROBIC AND ANAEROBIC Blood Culture results may not be optimal due to an inadequate volume of blood received in culture bottles    Culture   Final    NO GROWTH < 12 HOURS Performed at [facility], [place] [address] [place], [place] [address]    Report Status PENDING  Incomplete  SARS CORONAVIRUS 2 (TAT 6-24 HRS) Nasopharyngeal Nasopharyngeal Swab     Status: Abnormal   Collection Time: 10/04/2020  7:40 PM   Specimen: Nasopharyngeal Swab  Result Value Ref Range Status   SARS Coronavirus 2 POSITIVE (A) NEGATIVE Final    Comment: (NOTE) SARS-CoV-2 target nucleic acids are DETECTED.  The SARS-CoV-2 RNA is generally detectable in upper and lower respiratory specimens during the acute phase of infection. Positive results are indicative of the presence of SARS-CoV-2 RNA. Clinical correlation with patient history and other diagnostic information is  necessary to determine patient infection status. Positive results do not rule out bacterial infection or co-infection with other viruses.  The expected result is Negative.  Fact Sheet for Patients: SugarRoll.be  Fact Sheet for Healthcare Providers: https://www.woods-mathews.com/  This test is not yet approved or cleared by the [place] FDA and  has been authorized for detection and/or diagnosis of SARS-CoV-2 by FDA under an Emergency Use Authorization (EUA). This EUA will remain  in effect (meaning this test can be used) for the duration of the COVID-19 declaration under Section 564(b)(1) of the Act, 21 U. S.C. section 360bbb-3(b)(1),  unless the authorization is terminated or revoked sooner.   Performed at [facility], [place] [address], [place], [place] [address]     Radiology Reports CT Abdomen Pelvis W Contrast  Result Date: 09/22/2020 CLINICAL DATA:  History of neuroendocrine tumor with possible infection EXAM: CT ABDOMEN AND PELVIS WITH CONTRAST TECHNIQUE: Multidetector CT imaging of the abdomen and pelvis was performed using the standard protocol following bolus administration of intravenous contrast.  CONTRAST:  53mL OMNIPAQUE IOHEXOL 300 MG/ML  SOLN COMPARISON:  08/14/2020 FINDINGS: Lower chest: Lung bases demonstrate bilateral pleural effusions right greater than left increased when compared with the prior exam. The degree of right lower lobe consolidation has also increased from the prior exam. The visualized pulmonary artery demonstrates some suggestion of filling defects within the left lower lobe pulmonary arterial branches. These are incompletely evaluated on this exam. Hepatobiliary: Liver again demonstrates changes consistent with diffuse hepatic metastatic disease. The degree of decreased attenuation within the liver has increased significantly in the interval from the prior exam consistent with progressive metastatic disease. Scattered calcifications are again identified when compared with the prior study. Previously described central area of calcification is again identified and measures approximately 17 mm. The surrounding hepatic parenchyma however demonstrates diffuse decreased attenuation consistent with further metastatic disease. Gallbladder demonstrates dependent density likely related to milk of calcium. No biliary ductal dilatation is seen. The portal vein is attenuated but patent. This is similar to that noted on the prior exam. Pancreas: Unremarkable. No pancreatic ductal dilatation or surrounding inflammatory changes. Spleen: Normal in size without focal abnormality. Adrenals/Urinary Tract: Adrenal glands are within normal limits. The kidneys are well visualize within normal enhancement pattern bilaterally. Left renal cyst is again noted and stable. Delayed images demonstrate normal enhancement. The bladder is decompressed by Foley catheter. Stomach/Bowel: The colon is predominately decompressed the appendix is air-filled and within normal limits. Small bowel demonstrates some reactive wall thickening distally related to the associated ascites which has increased in the interval from the  prior exam. No obstructive changes are seen. Vascular/Lymphatic: Atherosclerotic calcifications are seen without aneurysmal dilatation. There is again noted a partially calcified central mesenteric mass which again measures 3.5 cm in greatest dimension stable in appearance from the prior exam. Reproductive: Prostate is again somewhat heterogeneous but within normal limits. Other: Significant ascites is noted when compared with the prior exam this has increased. Musculoskeletal: Degenerative changes of lumbar spine are noted. No acute bony abnormality is seen. IMPRESSION: Changes consistent with diffuse hepatic metastatic disease which appears to have increased in the interval from the prior exam. Increase in bilateral pleural effusions right greater than left with associated lower lobe consolidation on the right. Findings suspicious for pulmonary emboli in the left lower lobe. CTA of the chest could be performed to evaluate although felt to be best performed after short period of delay due to the patient's mildly elevated creatinine and decreased GFR. Increase in the degree of ascites consistent with the progressive metastatic disease. Stable central mesenteric mass consistent with the given clinical history. Some increased bowel wall thickening is noted in the distal small bowel. This could be related to the underlying mesenteric mass although may simply be reactive to the increase in ascites. Electronically Signed   By: Inez Catalina M.D.   On: 10/17/2020 20:34   DG Chest Port 1 View  Result Date: 09/26/2020 CLINICAL DATA:  Found unresponsive, possible sepsis EXAM: PORTABLE CHEST 1 VIEW COMPARISON:  08/14/2020 FINDINGS: Cardiac shadow is within normal limits.  Increased density is noted in the right hemithorax likely related to a posteriorly layering effusion. No definitive underlying infiltrate is seen. Left lung is clear. No bony abnormality is noted. IMPRESSION: Right-sided pleural effusion. No definitive  infiltrate is seen although may be obscured in the lower lobe by the effusion. Electronically Signed   By: Inez Catalina M.D.   On: 10/17/2020 17:22

## 2020-10-10 NOTE — ED Notes (Signed)
Pt can follow commands and answer all questions however, Pt is weak and has trouble repositioning himself in bed. Pt was repositioned as well as [place] on face

## 2020-10-10 NOTE — ED Notes (Signed)
Waiting for remdesivir med from main pharmacy.

## 2020-10-10 NOTE — ED Notes (Addendum)
Dr Velia Meyer notified of venous blood gas high results.  RN to contact MD when CMP results are back.

## 2020-10-10 NOTE — ED Notes (Signed)
Attending assessing Pt now and ultra sound also at bedside

## 2020-10-10 NOTE — Consult Note (Signed)
Consultation Note Date: 10/10/2020   Patient Name: Charles Schmidt  DOB: [birth year]  MRN: [id number]  Age / Sex: 80 y.o., male  PCP: Nolene Ebbs, MD Referring Physician: Jonetta Osgood, MD  Reason for Consultation: Establishing goals of care  HPI/Patient Profile: 80 y.o. 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 Per EMS arrival patient was alert but altered. Patient was recently diagnosed with COVID19 at rehab facility. Patient was admitted on 12/24/7094 with complicated UTI, acute metabolic encephalopathy, AKI, COVID19 infection.   Of note, PMT worked with patient during his last hospitalization in November 2021.  Patient and family face treatment option decisions and anticipatory care needs.  Clinical Assessment and Goals of Care: I have reviewed medical records including EPIC notes, labs, and imaging. Received report from primary RN - no acute concerns.   Went to visit patient at bedside - son/Charles Schmidt and [name] were present. Patient was lying in bed awake, alert,able to participate in some conversation, not able to make complex medical decisions for himself (confused at times). Signs and non-verbal gestures of pain and discomfort were noted. No respiratory distress, increased work of breathing, or secretions noted.   Met with patient, son/Charles Schmidt, and [name] to discuss diagnosis, prognosis, GOC, EOL wishes, disposition, and options.  I introduced Palliative Medicine as specialized medical care for people living with serious illness. It focuses on providing relief from the symptoms and stress of a serious illness. The goal is to improve quality of life for both the patient and the  family.  We discussed a brief life review of the patient as well as functional and nutritional status since PMT last saw him in November 2021. After that hospitalization, patient was discharged to [facility] for rehab. Family reports since that time the patient has had poor PO intake. They have noticed a continued gradual decline as well, but state that since he was diagnosed with COVID 2 weeks ago, they have noticed an even more rapid decline. Family express that before his COVID infection he was able to ambulate but now is bed bound and extremely weak.  We discussed patient's current illness and what it means in the larger context of patient's on-going co-morbidities. The family clearly understand the patient's acute medical situation as well as his extreme fragility. Natural disease trajectory and expectations at EOL were discussed. I attempted to elicit values and goals of care important to the patient. The difference between aggressive medical intervention and comfort care was considered in light of the patient's goals of care. The patient states repeatedly, "I am going downhill" and "I want to go home," however, when home hospice is discussed he states "when I leave I might need hospice, we don't know" and "I want to get better." At other points throughout conversation patient was speaking words/sentences that did not make sense in context of  conversation. Explained that medical team was concerned the patient's time is very limited. Family quickly state that once the patient recovers from COVID/discharges from the hospital, they were interested in discharging with hospice care. I gently explained that by "limited" the medical team is concerned the patient will not survive this hospitalization. Family understandably asked why we think that - reviewed Albumin of 1.6, which tells [place] he does not have much body reserve to fight this infection. Put acute medical situation (COVID and complications of  DVT/PE) in context of his cancer, other comorbidities and chronic illnesses, as well as age - family expressed understanding. I introduced the concept of a comfort path in house to patient and family and encouraged them to think about at what point patient would want to stop aggressive medical interventions and focus on comfort and dignity rather than cure/prolonging life. I gave medical recommendation for comfort care and pointed out to family that patient looks very uncomfortable at this time. The son states that the patient "has a desire to live" and they want to continue medical treatment. At this point patient starting telling me to "please leave now" repeatedly and family also began asking me to leave.  I recommended to family that if their goal were to continue medical treatment to allow 24-48 hours of watchful waiting. Recommended that within that time if the patient declines or shows no improvement they consider transition to comfort care. Family states at that time they would be willing to continue comfort care discussion. They are aware patient is at high risk of decline.  Discussed with patient/family the importance of continued conversation with each other and the medical providers regarding overall plan of care and treatment options, ensuring decisions are within the context of the patients values and GOCs.    Questions and concerns were addressed. The patient/family was encouraged to call with questions and/or concerns. PMT card was provided.  Primary Decision Maker: NEXT OF KIN - All reasonably available adult children     SUMMARY OF RECOMMENDATIONS:  Continue current medical treatment with 24-48 hours of watchful waiting  Continue DNR/DNI as previously documented  Patient's goals align with comfort care; however, he and family are not agreeable to stop current medical interventions that aim to prolong life  If patient declines or does not improve they are open to continue [place]  conversations around transition to comfort measures - they do understand he is at high risk for decline  Patient and family will benefit from continued education on EOL expectations and trajectory as well as the holistic view of the patient's current medical situation  Ongoing PMT discussions pending clinical course  PMT will continue to follow holistically   Code Status/Advance Care Planning:  DNR  Palliative Prophylaxis:   Aspiration, Bowel Regimen, Delirium Protocol, Frequent Pain Assessment, Oral Care and Turn Reposition  Additional Recommendations (Limitations, Scope, Preferences):  Full Scope Treatment  Psycho-social/Spiritual:   Desire for further Chaplaincy support:no Created space and opportunity for patient and family to express thoughts and feelings regarding patient's current medical situation.   Emotional support and therapeutic listening provided.  Prognosis:   < 2 weeks  Discharge Planning: To Be Determined      Primary Diagnoses: Present on Admission:  Complicated UTI (urinary tract infection)  Lactic acidosis  COVID-19 virus infection  AKI (acute kidney injury) ([place])  Hypotension  Acute metabolic encephalopathy   I have reviewed the medical record, interviewed the patient and family, and examined the patient. The following aspects are  pertinent.  Past Medical History:  Diagnosis Date   Bladder diverticulum 06/06/2020   BPH with obstruction/lower urinary tract symptoms    Cataract, mature    12-28-2017  per pt right eye, legally blind   Diarrhea concurrent with and due to carcinoid syndrome ([place])    12-28-2017 improvement since started Sandostatin monthly   ED (erectile dysfunction)    Elevated PSA    Family history of cancer in son    Family history of prostate cancer    Foley catheter in place    Glaucoma, left eye    History of colon polyps    History of urinary retention    2015; 2016; 01/ 2019   Hypertension     Metastatic malignant neuroendocrine tumor to liver [facility]) oncoloigst-  dr Benay Spice   dx 10-23-2017 by liver bx-- WHO grade 2; small bowel mesenteric mass, cirrhosis, mets left lower lobe solitary nodule ,  periaortic and paraspinal lymph nodes METS--  started monthly Sandostatin 11-20-2017   Microcytic anemia    RBBB (right bundle branch block with left anterior fascicular block)    Urinary retention    Venous reflux    Social History   Socioeconomic History   Marital status: Divorced    Spouse name: Not on file   Number of children: Not on file   Years of education: Not on file   Highest education level: Not on file  Occupational History   Not on file  Tobacco Use   Smoking status: Never Smoker   Smokeless tobacco: Never Used  Vaping Use   Vaping Use: Never used  Substance and Sexual Activity   Alcohol use: Not Currently    Alcohol/week: 1.0 standard drink    Types: 1 Standard drinks or equivalent per week    Comment: week   Drug use: No   Sexual activity: Not on file  Other Topics Concern   Not on file  Social History Narrative   Not on file   Social Determinants of Health   Financial Resource Strain: Not on file  Food Insecurity: Not on file  Transportation Needs: Not on file  Physical Activity: Not on file  Stress: Not on file  Social Connections: Not on file   Family History  Problem Relation Age of Onset   Hypertension Mother    Stroke Father    Prostate cancer Father        'slow growing'   Kidney Stones Sister    Cancer Son 38       neuroendocrine tumors of small intestine   Scheduled Meds:  finasteride  5 mg Oral QHS   heparin  5,000 Units Intravenous Once   loperamide  2 mg Oral TID   midodrine  10 mg Oral TID WC   octreotide  150 mcg Subcutaneous BID   tamsulosin  0.4 mg Oral QPC supper   Continuous Infusions:  ceFEPime (MAXIPIME) IV Stopped (10/10/20 0437)   heparin     lactated ringers     lactated ringers  150 mL/hr at 10/08/2020 2032   [START ON 10/11/2020] remdesivir 100 mg in NS 100 mL     PRN Meds:. Medications Prior to Admission:  Prior to Admission medications   Medication Sig Start Date End Date Taking? Authorizing Provider  acetaminophen (TYLENOL) 325 MG tablet Take 2 tablets (650 mg total) by mouth every 6 (six) hours as needed for moderate pain, fever or headache. 07/03/20   Martyn Malay, MD  cholestyramine light (PREVALITE) 4 g  packet Take 4 g by mouth 2 (two) times daily.    [provider]  diphenoxylate-atropine (LOMOTIL) 2.5-0.025 MG tablet Take 1 tablet by mouth 4 (four) times daily. 07/03/20   Martyn Malay, MD  dorzolamide-timolol (COSOPT) 22.3-6.8 MG/ML ophthalmic solution Place 1 drop into the left eye 2 (two) times daily.     [provider]  finasteride (PROSCAR) 5 MG tablet Take 5 mg by mouth at bedtime.     [provider]  furosemide (LASIX) 20 MG tablet Take 20 mg by mouth daily.    [provider]  liver oil-zinc oxide (DESITIN) 40 % ointment Apply 1 application topically 2 (two) times daily at 8 am and 10 pm.    [provider]  loperamide (IMODIUM) 2 MG capsule Take 1 capsule (2 mg total) by mouth 3 (three) times daily. 08/21/20   Domenic Polite, MD  Multiple Vitamins-Minerals (CENTRUM SILVER) tablet Take 1 tablet by mouth at bedtime.    [provider]  Nystatin (GERHARDT'S BUTT CREAM) CREA Apply 1 application topically 2 (two) times daily. Apply to sacrum and scrotum Patient not taking: Reported on 09/12/2020 07/03/20   Martyn Malay, MD  octreotide (SANDOSTATIN) 100 MCG/ML SOLN injection Inject 1.5 mLs (150 mcg total) into the skin every 12 (twelve) hours. 08/21/20   Domenic Polite, MD  ondansetron (ZOFRAN) 4 MG tablet Take 1 tablet (4 mg total) by mouth every 6 (six) hours as needed for nausea. Patient not taking: Reported on 09/12/2020 07/03/20   Martyn Malay, MD  oxyCODONE (OXY IR/ROXICODONE) 5 MG  immediate release tablet Take 1 tablet (5 mg total) by mouth every 6 (six) hours as needed for moderate pain. 08/21/20   Domenic Polite, MD  potassium chloride (MICRO-K) 10 MEQ CR capsule Take 10 mEq by mouth 2 (two) times daily.    [provider]  sodium bicarbonate 650 MG tablet Take 2 tablets (1,300 mg total) by mouth 3 (three) times daily. 07/03/20   Martyn Malay, MD  tamsulosin (FLOMAX) 0.4 MG CAPS capsule Take 1 capsule (0.4 mg total) by mouth daily after supper. 07/03/20   Martyn Malay, MD  Travoprost, BAK Free, (TRAVATAN) 0.004 % SOLN ophthalmic solution Place 1 drop into the left eye at bedtime.     [provider]   No Known Allergies Review of Systems  Unable to perform ROS: Acuity of condition    Physical Exam Vitals and nursing note reviewed.  Constitutional:      General: He is awake. He is not in acute distress.    Appearance: He is cachectic. He is ill-appearing.     Comments: Frail appearing  Pulmonary:     Effort: No respiratory distress.  Skin:    General: Skin is warm and dry.  Neurological:     Mental Status: He is easily aroused. He is confused.     Motor: Weakness present.  Psychiatric:        Mood and Affect: Affect is angry.        Behavior: Behavior is agitated.        Cognition and Memory: Cognition is impaired. Memory is impaired.     Vital Signs: BP (!) 86/63    Pulse 96    Temp (!) 97.4 F (36.3 C) (Oral)    Resp (!) 28    Ht 6' 4"  (1.93 m)    Wt 92 kg    SpO2 90%    BMI 24.69 kg/m  Pain Score: 0-No pain   SpO2: SpO2: 90 % O2 Device:SpO2: 90 % O2 Flow Rate: .   IO: Intake/output summary: No intake or output data in the 24 hours ending 10/10/20 1030  LBM:   Baseline Weight: Weight: 92 kg Most recent weight: Weight: 92 kg     Palliative Assessment/Data: PPS 20%     Time In: 1215 Time Out: 1330 Time Total: 75 minutes  Greater than 50%  of this time was spent counseling and coordinating care related to  the above assessment and plan.  Signed by: Lin Landsman, NP   Please contact Palliative Medicine Team phone at [phone] for questions and concerns.  For individual provider: See Shea Evans

## 2020-10-10 NOTE — ED Notes (Signed)
Attending Spoke to me before he left and passed along his assessment

## 2020-10-11 ENCOUNTER — Inpatient Hospital Stay (HOSPITAL_COMMUNITY): Payer: Medicare Other

## 2020-10-11 DIAGNOSIS — N390 Urinary tract infection, site not specified: Secondary | ICD-10-CM

## 2020-10-11 DIAGNOSIS — N179 Acute kidney failure, unspecified: Secondary | ICD-10-CM | POA: Diagnosis not present

## 2020-10-11 DIAGNOSIS — G9341 Metabolic encephalopathy: Secondary | ICD-10-CM | POA: Diagnosis not present

## 2020-10-11 DIAGNOSIS — R0603 Acute respiratory distress: Secondary | ICD-10-CM

## 2020-10-11 DIAGNOSIS — U071 COVID-19: Secondary | ICD-10-CM | POA: Diagnosis not present

## 2020-10-11 LAB — ECHOCARDIOGRAM COMPLETE
Height: 76 in
Weight: 3244.8 oz

## 2020-10-11 LAB — COMPREHENSIVE METABOLIC PANEL
ALT: 112 U/L — ABNORMAL HIGH (ref 0–44)
AST: 710 U/L — ABNORMAL HIGH (ref 15–41)
Albumin: 1.4 g/dL — ABNORMAL LOW (ref 3.5–5.0)
Alkaline Phosphatase: 154 U/L — ABNORMAL HIGH (ref 38–126)
Anion gap: 16 — ABNORMAL HIGH (ref 5–15)
BUN: 26 mg/dL — ABNORMAL HIGH (ref 8–23)
CO2: 17 mmol/L — ABNORMAL LOW (ref 22–32)
Calcium: 7.6 mg/dL — ABNORMAL LOW (ref 8.9–10.3)
Chloride: 111 mmol/L (ref 98–111)
Creatinine, Ser: 2.37 mg/dL — ABNORMAL HIGH (ref 0.61–1.24)
GFR, Estimated: 27 mL/min — ABNORMAL LOW (ref >60)
Glucose, Bld: 88 mg/dL (ref 70–99)
Potassium: 5.2 mmol/L — ABNORMAL HIGH (ref 3.5–5.1)
Sodium: 144 mmol/L (ref 135–145)
Total Bilirubin: 1.6 mg/dL — ABNORMAL HIGH (ref 0.3–1.2)
Total Protein: 5.2 g/dL — ABNORMAL LOW (ref 6.5–8.1)

## 2020-10-11 LAB — GLUCOSE, CAPILLARY
Glucose-Capillary: 26 mg/dL — CL (ref 70–99)
Glucose-Capillary: 76 mg/dL (ref 70–99)

## 2020-10-11 LAB — CBC
HCT: 22.8 % — ABNORMAL LOW (ref 39.0–52.0)
Hemoglobin: 8.2 g/dL — ABNORMAL LOW (ref 13.0–17.0)
MCH: 26.1 pg (ref 26.0–34.0)
MCHC: 36 g/dL (ref 30.0–36.0)
MCV: 72.6 fL — ABNORMAL LOW (ref 80.0–100.0)
Platelets: 91 10*3/uL — ABNORMAL LOW (ref 150–400)
RBC: 3.14 MIL/uL — ABNORMAL LOW (ref 4.22–5.81)
RDW: 21 % — ABNORMAL HIGH (ref 11.5–15.5)
WBC: 4.3 10*3/uL (ref 4.0–10.5)
nRBC: 0.5 % — ABNORMAL HIGH (ref 0.0–0.2)

## 2020-10-11 LAB — C-REACTIVE PROTEIN: CRP: 9.8 mg/dL — ABNORMAL HIGH (ref <1.0)

## 2020-10-11 LAB — PATHOLOGIST SMEAR REVIEW

## 2020-10-11 LAB — CORTISOL: Cortisol, Plasma: 49.5 ug/dL

## 2020-10-11 LAB — HEPARIN LEVEL (UNFRACTIONATED)
Heparin Unfractionated: 0.17 IU/mL — ABNORMAL LOW (ref 0.30–0.70)
Heparin Unfractionated: 0.23 IU/mL — ABNORMAL LOW (ref 0.30–0.70)

## 2020-10-11 MED ORDER — ONDANSETRON HCL 4 MG/2ML IJ SOLN: 4.0000 mg | Freq: Four times a day (QID) | INTRAMUSCULAR | Status: DC | PRN

## 2020-10-11 MED ORDER — HALOPERIDOL LACTATE 2 MG/ML PO CONC
2.0000 mg | Freq: Four times a day (QID) | ORAL | Status: DC | PRN
  Filled 2020-10-11: qty 1

## 2020-10-11 MED ORDER — LORAZEPAM 2 MG/ML IJ SOLN
1.0000 mg | INTRAMUSCULAR | Status: DC | PRN
  Administered 2020-10-15 (×2): 1 mg via INTRAVENOUS
  Filled 2020-10-11 (×2): qty 1

## 2020-10-11 MED ORDER — HALOPERIDOL LACTATE 5 MG/ML IJ SOLN: 2.0000 mg | Freq: Four times a day (QID) | INTRAMUSCULAR | Status: DC | PRN

## 2020-10-11 MED ORDER — LORAZEPAM 2 MG/ML PO CONC: 1.0000 mg | ORAL | Status: DC | PRN

## 2020-10-11 MED ORDER — ONDANSETRON 4 MG PO TBDP: 4.0000 mg | ORAL_TABLET | Freq: Four times a day (QID) | ORAL | Status: DC | PRN

## 2020-10-11 MED ORDER — DEXTROSE 50 % IV SOLN
1.0000 | Freq: Once | INTRAVENOUS | Status: AC
  Administered 2020-10-11: 50 mL via INTRAVENOUS

## 2020-10-11 MED ORDER — HEPARIN BOLUS VIA INFUSION
5000.0000 [IU] | Freq: Once | INTRAVENOUS | Status: AC
  Administered 2020-10-11: 5000 [IU] via INTRAVENOUS
  Filled 2020-10-11: qty 5000

## 2020-10-11 MED ORDER — BIOTENE DRY MOUTH MT LIQD
15.0000 mL | Freq: Two times a day (BID) | OROMUCOSAL | Status: DC
  Administered 2020-10-11 – 2020-10-14 (×6): 15 mL via TOPICAL

## 2020-10-11 MED ORDER — OXYCODONE HCL 5 MG PO TABS
5.0000 mg | ORAL_TABLET | ORAL | Status: DC | PRN
Start: 2020-10-11 — End: 2020-10-12
  Filled 2020-10-11: qty 1

## 2020-10-11 MED ORDER — GLYCOPYRROLATE 0.2 MG/ML IJ SOLN: 0.2000 mg | INTRAMUSCULAR | Status: DC | PRN

## 2020-10-11 MED ORDER — GLYCOPYRROLATE 0.2 MG/ML IJ SOLN
0.2000 mg | INTRAMUSCULAR | Status: DC | PRN
  Administered 2020-10-14 – 2020-10-15 (×2): 0.2 mg via INTRAVENOUS
  Filled 2020-10-11 (×2): qty 1

## 2020-10-11 MED ORDER — GLYCOPYRROLATE 1 MG PO TABS: 1.0000 mg | ORAL_TABLET | ORAL | Status: DC | PRN

## 2020-10-11 MED ORDER — ACETAMINOPHEN 325 MG PO TABS: 650.0000 mg | ORAL_TABLET | Freq: Four times a day (QID) | ORAL | Status: DC | PRN

## 2020-10-11 MED ORDER — ACETAMINOPHEN 650 MG RE SUPP: 650.0000 mg | Freq: Four times a day (QID) | RECTAL | Status: DC | PRN

## 2020-10-11 MED ORDER — CHLORHEXIDINE GLUCONATE CLOTH 2 % EX PADS
6.0000 | MEDICATED_PAD | Freq: Every day | CUTANEOUS | Status: DC
  Administered 2020-10-11: 6 via TOPICAL

## 2020-10-11 MED ORDER — HALOPERIDOL 1 MG PO TABS: 2.0000 mg | ORAL_TABLET | Freq: Four times a day (QID) | ORAL | Status: DC | PRN

## 2020-10-11 MED ORDER — POLYVINYL ALCOHOL 1.4 % OP SOLN
1.0000 [drp] | Freq: Four times a day (QID) | OPHTHALMIC | Status: DC | PRN
  Filled 2020-10-11: qty 15

## 2020-10-11 MED ORDER — MORPHINE SULFATE (PF) 2 MG/ML IV SOLN
2.0000 mg | INTRAVENOUS | Status: DC | PRN
  Administered 2020-10-14 – 2020-10-15 (×4): 2 mg via INTRAVENOUS
  Filled 2020-10-11 (×4): qty 1

## 2020-10-11 MED ORDER — DEXTROSE IN LACTATED RINGERS 5 % IV SOLN: INTRAVENOUS | Status: DC

## 2020-10-11 MED ORDER — LORAZEPAM 1 MG PO TABS: 1.0000 mg | ORAL_TABLET | ORAL | Status: DC | PRN

## 2020-10-11 NOTE — Progress Notes (Signed)
IP PROGRESS NOTE  Subjective:   [name] is well-known to me with a history of metastatic carcinoid tumor, currently being treated with lanreotide and octreotide.  He has been admitted repeatedly over the past several months with urinary tract infections and altered mental status.  He presented to the emergency room 10/22/2020 with altered mental status.  He had clinical findings to suggest sepsis.  He is admitted for further evaluation.  [name] was found to be COVID-positive at the skilled nursing facility within the past few weeks (I do not have the diagnosis date available today).  He again tested COVID-positive at the [facility] emergency room.  A urinalysis on hospital admission suggested an infection.  He was placed on IV antibiotics and received fluids for hypotension.  [name] continues to have an altered mental status.  An admission CT of the abdomen confirmed progressive metastatic disease involving the liver, increased ascites, and a probable left lung pulmonary embolism.  Dopplers of the legs revealed bilateral DVTs.  Objective: Vital signs in last 24 hours: Blood pressure (!) 89/53, pulse (!) 118, temperature 97.9 F (36.6 C), temperature source Axillary, resp. rate 20, height 6' 4"  (1.93 m), weight 202 lb 12.8 oz (92 kg), SpO2 93 %.  Intake/Output from previous day: 01/19 0701 - 01/20 0700 In: 1821.8 [I.V.:1642.4; IV Piggyback:179.4] Out: -   Physical Exam:  He appears comfortable Abdomen: Marked hepatomegaly Extremities: Pitting edema in both legs Neurologic: Alert, nonverbal, not following commands  Portacath/PICC-without erythema  Lab Results: Recent Labs    10/03/2020 1755 10/10/20 0213 10/10/20 0230  WBC 4.9  --  7.0  HGB 9.3* 11.6* 9.8*  HCT 27.5* 34.0* 29.5*  PLT 124*  --  122*    BMET Recent Labs    10/22/2020 1755 10/10/20 0213 10/10/20 0418  NA 141 136 140  K 4.2 >8.5* 4.4  CL 107  --  107  CO2 18*  --  18*  GLUCOSE 164*  --  105*  BUN  18  --  18  CREATININE 1.63*  --  1.55*  CALCIUM 8.0*  --  8.0*    No results found for: CEA1  Studies/Results: CT Abdomen Pelvis W Contrast  Result Date: 10/20/2020 CLINICAL DATA:  History of neuroendocrine tumor with possible infection EXAM: CT ABDOMEN AND PELVIS WITH CONTRAST TECHNIQUE: Multidetector CT imaging of the abdomen and pelvis was performed using the standard protocol following bolus administration of intravenous contrast. CONTRAST:  59m OMNIPAQUE IOHEXOL 300 MG/ML  SOLN COMPARISON:  08/14/2020 FINDINGS: Lower chest: Lung bases demonstrate bilateral pleural effusions right greater than left increased when compared with the prior exam. The degree of right lower lobe consolidation has also increased from the prior exam. The visualized pulmonary artery demonstrates some suggestion of filling defects within the left lower lobe pulmonary arterial branches. These are incompletely evaluated on this exam. Hepatobiliary: Liver again demonstrates changes consistent with diffuse hepatic metastatic disease. The degree of decreased attenuation within the liver has increased significantly in the interval from the prior exam consistent with progressive metastatic disease. Scattered calcifications are again identified when compared with the prior study. Previously described central area of calcification is again identified and measures approximately 17 mm. The surrounding hepatic parenchyma however demonstrates diffuse decreased attenuation consistent with further metastatic disease. Gallbladder demonstrates dependent density likely related to milk of calcium. No biliary ductal dilatation is seen. The portal vein is attenuated but patent. This is similar to that noted on the prior exam. Pancreas: Unremarkable. No pancreatic  ductal dilatation or surrounding inflammatory changes. Spleen: Normal in size without focal abnormality. Adrenals/Urinary Tract: Adrenal glands are within normal limits. The kidneys are  well visualize within normal enhancement pattern bilaterally. Left renal cyst is again noted and stable. Delayed images demonstrate normal enhancement. The bladder is decompressed by Foley catheter. Stomach/Bowel: The colon is predominately decompressed the appendix is air-filled and within normal limits. Small bowel demonstrates some reactive wall thickening distally related to the associated ascites which has increased in the interval from the prior exam. No obstructive changes are seen. Vascular/Lymphatic: Atherosclerotic calcifications are seen without aneurysmal dilatation. There is again noted a partially calcified central mesenteric mass which again measures 3.5 cm in greatest dimension stable in appearance from the prior exam. Reproductive: Prostate is again somewhat heterogeneous but within normal limits. Other: Significant ascites is noted when compared with the prior exam this has increased. Musculoskeletal: Degenerative changes of lumbar spine are noted. No acute bony abnormality is seen. IMPRESSION: Changes consistent with diffuse hepatic metastatic disease which appears to have increased in the interval from the prior exam. Increase in bilateral pleural effusions right greater than left with associated lower lobe consolidation on the right. Findings suspicious for pulmonary emboli in the left lower lobe. CTA of the chest could be performed to evaluate although felt to be best performed after short period of delay due to the patient's mildly elevated creatinine and decreased GFR. Increase in the degree of ascites consistent with the progressive metastatic disease. Stable central mesenteric mass consistent with the given clinical history. Some increased bowel wall thickening is noted in the distal small bowel. This could be related to the underlying mesenteric mass although may simply be reactive to the increase in ascites. Electronically Signed   By: Inez Catalina M.D.   On: 10/20/2020 20:34   DG Chest  Port 1 View  Result Date: 10/06/2020 CLINICAL DATA:  Found unresponsive, possible sepsis EXAM: PORTABLE CHEST 1 VIEW COMPARISON:  08/14/2020 FINDINGS: Cardiac shadow is within normal limits. Increased density is noted in the right hemithorax likely related to a posteriorly layering effusion. No definitive underlying infiltrate is seen. Left lung is clear. No bony abnormality is noted. IMPRESSION: Right-sided pleural effusion. No definitive infiltrate is seen although may be obscured in the lower lobe by the effusion. Electronically Signed   By: Inez Catalina M.D.   On: 10/05/2020 17:22   DG Chest Port 1V same Day  Result Date: 10/10/2020 CLINICAL DATA:  Altered mental status.  COVID positive EXAM: PORTABLE CHEST 1 VIEW COMPARISON:  10/18/2020 FINDINGS: Elevated right hemidiaphragm with right lower lobe airspace disease and right effusion, unchanged. Mild left lower lobe airspace disease has progressed. Pulmonary vascular congestion is present suggesting fluid overload. IMPRESSION: Pulmonary vascular congestion suggesting mild fluid overload Right lower lobe airspace disease and right effusion unchanged. Progression of left lower lobe atelectasis/infiltrate. Electronically Signed   By: Franchot Gallo M.D.   On: 10/10/2020 10:58   VAS [place] LOWER EXTREMITY VENOUS (DVT)  Result Date: 10/10/2020  Lower Venous DVT Study Indications: D-dimer, Covid-19, swelling, PE.  Risk Factors: Cancer Metastatic malignant neuroendocrine tumor to liver. Comparison Study: 07-03-2020 Prior bilateral lower extremity venous was negative                   for DVT. Performing Technologist: Darlin Coco RDMS  Examination Guidelines: A complete evaluation includes B-mode imaging, spectral Doppler, color Doppler, and power Doppler as needed of all accessible portions of each vessel. Bilateral testing is considered an  integral part of a complete examination. Limited examinations for reoccurring indications may be performed as noted. The  reflux portion of the exam is performed with the patient in reverse Trendelenburg.  +---------+---------------+---------+-----------+----------+--------------+ RIGHT    CompressibilityPhasicitySpontaneityPropertiesThrombus Aging +---------+---------------+---------+-----------+----------+--------------+ CFV      Partial        Yes      Yes                  Acute          +---------+---------------+---------+-----------+----------+--------------+ SFJ      Partial        Yes      Yes                  Acute          +---------+---------------+---------+-----------+----------+--------------+ FV Prox  None                                         Acute          +---------+---------------+---------+-----------+----------+--------------+ FV Mid                  Yes      Yes                                 +---------+---------------+---------+-----------+----------+--------------+ FV DistalPartial        Yes      Yes                  Acute          +---------+---------------+---------+-----------+----------+--------------+ PFV      None                                         Acute          +---------+---------------+---------+-----------+----------+--------------+ POP      Partial        Yes      Yes                  Acute          +---------+---------------+---------+-----------+----------+--------------+ PTV                     No       No                                  +---------+---------------+---------+-----------+----------+--------------+ PERO                                                  Not visualized +---------+---------------+---------+-----------+----------+--------------+   +---------+---------------+---------+-----------+----------+-------------------+ LEFT     CompressibilityPhasicitySpontaneityPropertiesThrombus Aging      +---------+---------------+---------+-----------+----------+-------------------+ CFV      None            No       No                   Acute               +---------+---------------+---------+-----------+----------+-------------------+ SFJ  None                                         Acute               +---------+---------------+---------+-----------+----------+-------------------+ FV Prox                 Yes      Yes                                      +---------+---------------+---------+-----------+----------+-------------------+ FV Mid                  Yes      Yes                                      +---------+---------------+---------+-----------+----------+-------------------+ FV Distal               No       No                   Acute               +---------+---------------+---------+-----------+----------+-------------------+ PFV                     Yes      Yes                                      +---------+---------------+---------+-----------+----------+-------------------+ POP                                                   Unable to visualize                                                       due to patient                                                            position            +---------+---------------+---------+-----------+----------+-------------------+ PTV                     No       No                   Acute               +---------+---------------+---------+-----------+----------+-------------------+ PERO  Not visualized      +---------+---------------+---------+-----------+----------+-------------------+    Summary: RIGHT: - Findings consistent with acute deep vein thrombosis involving the right common femoral vein, SF junction, right femoral vein, right popliteal vein, right proximal profunda vein, and right posterior tibial veins. - No cystic structure found in the popliteal fossa.  LEFT: - Findings consistent with acute deep vein thrombosis  involving the left common femoral vein, SF junction, left femoral vein, and left posterior tibial veins.  *See table(s) above for measurements and observations. Electronically signed by Monica Martinez MD on 10/10/2020 at 5:59:55 PM.    Final     Medications: I have reviewed the patient's current medications.  Assessment/Plan: 1.Metastatic carcinoid tumor, biopsy of a liver lesion 10/23/2017 consistent with a well-differentiated neuroendocrine neoplasm, WHO grade 2,[id number]%  CT abdomen/pelvis 10/13/2017-extensive liver metastases, cirrhosis, soft tissue mass in the small bowel mesentery versus a primary small bowel tumor, tiny lucent lesions in the pelvic bones  Elevated chromogranin A and 24-hour urine 5-HIAA  CT chest 11/24/2017-subpleural nodule in the left lower lobe with associated radiotracer activity on comparison DOTATATEPET scan. No additional evidence of thoracic metastasis.  DOTATATE PET scan3/01/2018-intense radiotracer accumulation with innumerable confluent hepatic metastasis; intense radiotracer activity within central mesenteric mass; 2 foci of uptake associated with the small bowel; intense radiotracer activity associated periaortic and paraspinal lymph nodes; more distant solitary small metastasis within the pleural space left lower lobe.  Monthly Sandostatin initiated 11/26/2017, dose increased to 30 mg 01/21/2018  Cycle 1 Lutathera 03/03/2018, monthly Sandostatin continued  Cycle 2 Lutathera 04/27/2018, monthly Sandostatin continued  Cycle 3 Lutathera 06/23/2018  Cycle 4 Lutathera 08/18/2018  Restaging dotatate PET scan 09/29/2018-multiple lesions again demonstrated within the liver which accumulate the radiotracer. The number of lesions which accumulate the tracer has decreased visually compared to the prior exam. Some lesions are no longer evident along the right hepatic margin. Remaining measurable lesions have radiotracer activity similar to prior. No new lesions present.  Central mesenteric mass unchanged and remains avid for radiotracer. Adjacent lesion within the small bowel SUV max equal 10.5 compared to SUV max equal 15.8. Smaller lesion previously identified in the upper pelvis small bowel appears decreased in activity. No new metastatic lesions. Lesion position between the right psoas muscle and spine SUV max equal to 21.8 compared with SUV max equal 26.2.  Dotatate PET 10/19/2019-no evidence of disease progression, stable multifocal hepatic metastases, unchanged from post therapy scan 03/30/2019 and improved from pretherapy scan5/01/2018. Stable mesenteric mass, improved retroperitoneal lymph node adjacent to the celiac trunk, solitary small bowel lesion unchanged  Monthly Sandostatin continued  01/26/2020 chromogranin A level stable elevation  CT abdomen/pelvis 06/05/2020-slight enlargement of central mesenteric mass, extensive hepatic metastases-unchanged, bowel wall thickening in central small bowel loops and right colon  Lanreotide 07/09/2020, 08/06/2020, 08/31/2020  CT abdomen/pelvis 10/18/2020-progressive liver metastases  2.Diarrhea-likely secondary to carcinoid syndrome, improved with Sandostatin 3.History of anorexia/weight loss-improved 4.Prostatic hypertrophy;status post laser vaporization 01/05/2018 5. Mild pancytopenia following Lutathera treatment 6.Thoracic dermatomal zoster rash 04/02/2019 treated with Valtrex 7. Admission 06/05/2020 after a fall with failure to thrive, increased diarrhea, nausea, and abdominal pain 8. Markedly elevated AST and CPK on admission -likely rhabdomyolysis 9. Acute renal failure-improved 10. Staph epidermidis bacteremia and E. coli UTI 06/22/2020 -completed course of IV antibiotics 11. Admission with hypotension, nausea, and diarrhea 08/14/2020 12. Anemia-likely secondary to chronic disease and renal insufficiency, chronic Red cell microcytosis likely related to a thalassemia variant or chronic  disease 13. Urinary tract infection-culture from 08/14/2020 +  for Pseudomonas aeruginosa 14. COVID-19 + January 2022 15. admission 10/20/2020 with altered mental status and sepsis syndrome 16. bilateral lower extremity deep vein thromboses 10/10/2020, left lower lung pulmonary embolism on CT abdomen/pelvis 09/26/2020   Mr. Geiler is admitted with altered mental status and sepsis syndrome. He had a recent COVID-19 infection. He has been diagnosed with bilateral lower extremity deep vein thromboses and a probable pulmonary embolism. He may have urosepsis.  I reviewed the restaging CT images. There is evidence of progressive metastatic disease involving the liver.  Mr. Matters has progressive advanced metastatic carcinoid tumor and multiple comorbid conditions. He has been diagnosed with DVTs, renal failure, and encephalopathy. I recommend hospice care. I discussed the case with his son, Cono Gebhard. He and the family understand the poor prognosis. Teja Judice reports the family agrees to a no CODE BLUE status and palliative care for Mr. Gatta.  I recommend proceeding with comfort measures and hospice.  Please call Oncology as needed.   LOS: 2 days   Betsy Coder, MD   10/11/2020, 7:56 AM

## 2020-10-11 NOTE — Progress Notes (Signed)
Inpatient Diabetes Program Recommendations  AACE/ADA: New Consensus Statement on Inpatient Glycemic Control (2015)  Target Ranges:  Prepandial:   less than 140 mg/dL      Peak postprandial:   less than 180 mg/dL (1-2 hours)      Critically ill patients:  140 - 180 mg/dL   Lab Results  Component Value Date   GLUCAP 76 10/11/2020    Review of Glycemic Control Results for Charles Schmidt, Charles Schmidt (MRN [id number]) as of 10/11/2020 11:35  Ref. Range 10/11/2020 01:19 10/11/2020 01:47  Glucose-Capillary Latest Ref Range: 70 - 99 mg/dL 26 (LL) 76   Current orders for Inpatient glycemic control: none D5%@100  ml/hr  Inpatient Diabetes Program Recommendations:    Noted hypoglycemia of 26 mg/dL. Consider adding CBGs Q4H given poor oral intake.   Thanks, Bronson Curb, MSN, RNC-OB Diabetes Coordinator [phone] (8a-5p)

## 2020-10-11 NOTE — Progress Notes (Signed)
S/w patient daughter Linwood Dibbles. Advised patient is unable to have visitors at this time due to policy. Daughter was under the impression that patient had been made comfort care. Advised that this decision had not yet been made on patients behalf. Advised that patient is tired, and has no appetite, and family should begin discussing whether or not patient should transition to comfort measures.

## 2020-10-11 NOTE — Progress Notes (Addendum)
PROGRESS NOTE                                                                                                                                                                                                             Patient Demographics:    Charles Schmidt, is a 80 y.o. male, DOB - [birth year], ZDG:[id number]  Outpatient Primary MD for the patient is Nolene Ebbs, MD   Admit date - 09/24/2020   LOS - 2  Chief Complaint  Patient presents with  . Altered Mental Status       Brief Narrative: Patient is a 80 y.o. male with PMHx of metastatic carcinoid tumor, diarrhea related to carcinoid, BPH-with indwelling chronic Foley catheter, recurrent UTI-resident of SNF-who apparently was COVID-positive approximately 2 weeks back (was in isolation at SNF)-presenting with altered mental status-found to have acute metabolic encephalopathy in the context of COVID 19 infection/bacterial pneumonia and complicated UTI.  COVID-19 vaccinated status:   Significant Events: 1/18>> Admit to [facility] for metabolic encephalopathy-complicated UTI, [id number] infection.  Hypotension requiring multiple fluid boluses. 1/19>> persistent hypotension/worsening hypoxia-started on midodrine-IV fluid boluses-on 3-4 L of O2.  Significant studies: 1/18>> CT abdomen/pelvis with contrast: Diffuse hepatic metastatic disease-increased compared to prior exam.  Increased bilateral pleural effusions-with RLL>> LLL consolidation.  Suspicious pulmonary emboli in left lower lobe.  Stable central mesenteric mass consistent with history of carcinoid.  Increase in degree of ascites. 1/18>> chest x-ray: Right-sided pleural effusion, no definite infiltrate seen. 1/19>> bilateral lower extremity Doppler:+ve DVT (prelim-discussed with technician)  COVID-19 medications: Remdesivir: 1/18>>  Antibiotics: Cefepime: 1/18>>  Microbiology data: 1/18 >>blood culture: No  growth 1/18>> urine culture: E. coli-sensitivities pending. 08/14/20>> urine culture: Pansensitive Pseudomonas  Procedures: None  Consults: Palliative care, oncology  DVT prophylaxis: IV heparin infusion.    Subjective:   Continues to be lethargic-barely talks-per RN staff-no oral intake since yesterday.   Assessment  & Plan :   Acute metabolic encephalopathy: Multifactorial etiology-secondary to complicated UTI, hypotension/hypoxia.  He continues to exhibit significant lethargy-plans are to continue with supportive care.  Complicated UTI: Has chronic indwelling Foley catheter likely contributing to recurrent UTIs.  Has prior history of Pseudomonas UTI-urine cultures growing E. coli-given risk for MDR's-continue cefepime till final sensitivity obtained.   AKI on CKD stage IIIa: AKI likely hemodynamically mediated due to COVID-19 infection/poor oral intake, UTI and  probably diarrhea related to carcinoid.  Unfortunately-with supportive care-renal function continues to worsen.  Continue to avoid nephrotoxic agents.  He clearly is another HD candidate in my opinion.  .  Acute hypoxic respiratory failure due to pulmonary embolism, HCAP/possible COVID-19 pneumonia and pleural effusion: Remains on 4 L of oxygen this morning-no longer on remdesivir due to worsening LFTs-on cefepime and IV heparin.    PE/bilateral lower extremity DVT: Provoked by COVID-19-poor functional status-underlying malignancy.  He has liver metastases-and INR is already at 1.7.  He appears incredibly frail-debilitated-underlying prognosis appears very poor-apart from IV heparin-do not think patient would benefit from any further escalation in care including lytics/catheter guided therapy etc.  Hypotension: Suspect this is multifactorial-related to hypovolemia in the setting of diarrhea due to carcinoid-probably bedbound status/chronic debility/frailty also contributing.  Random cortisol level appropriate.  On  midodrine-with systolic blood pressures remaining in the 90s.  Not a candidate for any further escalation of care.  Suspect prognosis to be very grim-unlikely to survive this hospitalization.  Any further escalation in care will likely not change management.  COVID-19 infection: No significant groundglass opacities/COVID-19 related changes seen on imaging studies.  Since this has been ongoing for 2 weeks-not sure if Remdesivir has any role-further more-due to significantly elevated LFTs-Remdesivir probably not a good idea.  History of metastatic carcinoid: Discussed with patient's oncologist-has had significant issues with debility/multiple hospitalizations-but had recently improved somewhat-now back in the hospital with above noted issues.  Awaiting input from patient's primary oncologist-but I did discuss with him yesterday-agrees with no CODE BLUE and gentle medical treatment.  Continue octreotide injection for now.  Chronic urinary retention-chronic indwelling Foley catheter-BPH: Foley catheter in place-continue Flomax and finasteride.  Poor oral intake/failure to thrive syndrome: Very frail/debilitated baseline-now with further worsening due to acute infection and other issues as outlined above.  Per nursing staff-no significant oral intake since yesterday-he continues to refuse any sort of oral intake.  I suspect he will benefit from transitioning to comfort measures  Palliative care: Incredibly frail-debilitated-SNF resident-with metastatic carcinoid-now with COVID infection resulting in PE/DVT-hypotensive-hypoxic-given his frailty (albumin of 1.6!!)-tenuous clinical status-after extensive discussion with patient's [name] over the phone on 1/19-given poor prognosis-he was made a DNR.  Palliative care following-we will await further recommendations.  Patient's prognosis continues to be very grim-he has now with severe failure to thrive syndrome and worsening AKI.  This MD again spoke with the  patient's [name] on 1/20-we probably should transition to full comfort measures-he asked me to give him a few hours to take over-I will call him later this afternoon.  We will try and get in touch with palliative care as well.  Addendum: Palliative care reach out to son earlier this afternoon-goals of care now for comfort-transition to comfort care-but family wants to continue with IV heparin/IV antibiotics for another few more days.      Component Value Date/Time   BNP 82.4 08/14/2020 1020    GI prophylaxis: PPI  ABG:    Component Value Date/Time   HCO3 21.3 10/10/2020 0213   TCO2 23 10/10/2020 0213   ACIDBASEDEF 4.0 (H) 10/10/2020 0213   O2SAT 99.0 10/10/2020 0213    Vent Settings: N/A   Condition - Extremely Guarded  Family Communication  : Son-Mark-[phone]-updated over the phone 1/20  Code Status :  DNR  Diet :  Diet Order            DIET - DYS 1 Room service appropriate? No; Fluid consistency: Nectar Thick  Diet effective now                  Disposition Plan  :   Status is: Inpatient  Remains inpatient appropriate because:Inpatient level of care appropriate due to severity of illness   Dispo: The patient is from: SNF              Anticipated d/c is to: TBD              Anticipated d/c date is: > 3 days              Patient currently is not medically stable to d/c.    Barriers to discharge: Worsening AKI-developing severe failure to thrive syndrome  Antimicorbials  :    Anti-infectives (From admission, onward)   Start     Dose/Rate Route Frequency Ordered Stop   10/11/20 1000  remdesivir 100 mg in sodium chloride 0.9 % 100 mL IVPB  Status:  Discontinued       "Followed by" Linked Group Details   100 mg 200 mL/hr over 30 Minutes Intravenous Daily 10/10/20 0123 10/10/20 1121   10/10/20 0500  ceFEPIme (MAXIPIME) 2 g in sodium chloride 0.9 % 100 mL IVPB        2 g 200 mL/hr over 30 Minutes Intravenous Every 12 hours 09/27/2020 1947     10/10/20  0200  remdesivir 200 mg in sodium chloride 0.9% 250 mL IVPB       "Followed by" Linked Group Details   200 mg 580 mL/hr over 30 Minutes Intravenous Once 10/10/20 0123 10/10/20 0323   10/10/2020 1700  ceFEPIme (MAXIPIME) 2 g in sodium chloride 0.9 % 100 mL IVPB        2 g 200 mL/hr over 30 Minutes Intravenous  Once 10/10/2020 1651 09/28/2020 1800      Inpatient Medications  Scheduled Meds: . Chlorhexidine Gluconate Cloth  6 each Topical Daily  . finasteride  5 mg Oral QHS  . loperamide  2 mg Oral TID  . midodrine  10 mg Oral TID WC  . octreotide  150 mcg Subcutaneous BID  . pantoprazole  40 mg Oral Q1200  . sodium chloride flush  10-40 mL Intracatheter Q12H  . tamsulosin  0.4 mg Oral QPC supper   Continuous Infusions: . ceFEPime (MAXIPIME) IV Stopped (10/11/20 0618)  . dextrose 5% lactated ringers 100 mL/hr at 10/11/20 1052  . heparin 1,800 Units/hr (10/11/20 1131)   PRN Meds:.   Time Spent in minutes  35  See all Orders from today for further details   Oren Binet M.D on 10/11/2020 at 12:41 PM  To page go to [email or web address] - use universal password  [facility] -  Office  [phone]    Objective:   Vitals:   10/11/20 0255 10/11/20 0554 10/11/20 0753 10/11/20 0900  BP: (!) 84/55 (!) 89/53  (!) 92/57  Pulse:  (!) 117 (!) 118 (!) 117  Resp:  18 20 20   Temp: 98.1 F (36.7 C) 97.9 F (36.6 C)    TempSrc: Oral Axillary    SpO2:  91% 93% 96%  Weight:      Height:        Wt Readings from Last 3 Encounters:  10/10/20 92 kg  08/31/20 92 kg  08/21/20 97.2 kg     Intake/Output Summary (Last 24 hours) at 10/11/2020 1241 Last data filed at 10/11/2020 1052 Gross per 24 hour  Intake 2282.7 ml  Output --  Net 2282.7 ml  Physical Exam Gen Exam: Very lethargic-but not in any distress. HEENT:atraumatic, normocephalic Chest: B/L clear to auscultation anteriorly CVS:S1S2 regular Abdomen:soft non tender, non distended Extremities:++ edema Neurology: Non  focal but moving all 4 extremities. Skin: no rash   Data Review:    CBC Recent Labs  Lab 09/25/2020 1755 10/10/20 0213 10/10/20 0230  WBC 4.9  --  7.0  HGB 9.3* 11.6* 9.8*  HCT 27.5* 34.0* 29.5*  PLT 124*  --  122*  MCV 77.2*  --  75.4*  MCH 26.1  --  25.1*  MCHC 33.8  --  33.2  RDW 21.6*  --  22.6*  LYMPHSABS 1.1  --  0.5*  MONOABS 0.2  --  0.4  EOSABS 0.0  --  0.0  BASOSABS 0.0  --  0.0    Chemistries  Recent Labs  Lab 09/22/2020 1755 10/10/20 0213 10/10/20 0418 10/11/20 0827  NA 141 136 140 144  K 4.2 >8.5* 4.4 5.2*  CL 107  --  107 111  CO2 18*  --  18* 17*  GLUCOSE 164*  --  105* 88  BUN 18  --  18 26*  CREATININE 1.63*  --  1.55* 2.37*  CALCIUM 8.0*  --  8.0* 7.6*  MG  --   --  1.8  --   AST 65*  --  537* 710*  ALT 23  --  54* 112*  ALKPHOS 198*  --  175* 154*  BILITOT 1.1  --  1.6* 1.6*   ------------------------------------------------------------------------------------------------------------------ No results for input(s): CHOL, HDL, LDLCALC, TRIG, CHOLHDL, LDLDIRECT in the last 72 hours.  No results found for: HGBA1C ------------------------------------------------------------------------------------------------------------------ Recent Labs    10/10/20 0230  TSH 2.509   ------------------------------------------------------------------------------------------------------------------ Recent Labs    10/10/20 0230  FERRITIN >7,500*    Coagulation profile Recent Labs  Lab 09/26/2020 1644 10/10/20 0230  INR 1.5* 1.7*    No results for input(s): DDIMER in the last 72 hours.  Cardiac Enzymes No results for input(s): CKMB, TROPONINI, MYOGLOBIN in the last 168 hours.  Invalid input(s): CK ------------------------------------------------------------------------------------------------------------------    Component Value Date/Time   BNP 82.4 08/14/2020 1020    Micro Results Recent Results (from the past 240 hour(s))  Blood Culture  (routine x 2)     Status: None (Preliminary result)   Collection Time: 10/22/2020  5:20 PM   Specimen: BLOOD RIGHT ARM  Result Value Ref Range Status   Specimen Description BLOOD RIGHT ARM  Final   Special Requests   Final    BOTTLES DRAWN AEROBIC AND ANAEROBIC Blood Culture adequate volume   Culture   Final    NO GROWTH 2 DAYS Performed at [facility], [address], [place], [place] [address]    Report Status PENDING  Incomplete  Blood Culture (routine x 2)     Status: None (Preliminary result)   Collection Time: 10/08/2020  5:30 PM   Specimen: BLOOD RIGHT ARM  Result Value Ref Range Status   Specimen Description BLOOD RIGHT ARM  Final   Special Requests   Final    BOTTLES DRAWN AEROBIC AND ANAEROBIC Blood Culture results may not be optimal due to an inadequate volume of blood received in culture bottles   Culture   Final    NO GROWTH 2 DAYS Performed at [facility], [place] [address] [place], [place] [address]    Report Status PENDING  Incomplete  SARS CORONAVIRUS 2 (TAT 6-24 HRS) Nasopharyngeal Nasopharyngeal Swab  Status: Abnormal   Collection Time: 10/08/2020  7:40 PM   Specimen: Nasopharyngeal Swab  Result Value Ref Range Status   SARS Coronavirus 2 POSITIVE (A) NEGATIVE Final    Comment: (NOTE) SARS-CoV-2 target nucleic acids are DETECTED.  The SARS-CoV-2 RNA is generally detectable in upper and lower respiratory specimens during the acute phase of infection. Positive results are indicative of the presence of SARS-CoV-2 RNA. Clinical correlation with patient history and other diagnostic information is  necessary to determine patient infection status. Positive results do not rule out bacterial infection or co-infection with other viruses.  The expected result is Negative.  Fact Sheet for Patients: SugarRoll.be  Fact Sheet for Healthcare Providers: https://www.woods-mathews.com/  This test is not yet approved or  cleared by the [place] FDA and  has been authorized for detection and/or diagnosis of SARS-CoV-2 by FDA under an Emergency Use Authorization (EUA). This EUA will remain  in effect (meaning this test can be used) for the duration of the COVID-19 declaration under Section 564(b)(1) of the Act, 21 U. S.C. section 360bbb-3(b)(1), unless the authorization is terminated or revoked sooner.   Performed at [facility], [place] [address] [place], [place] [address]   Urine culture     Status: Abnormal (Preliminary result)   Collection Time: 09/28/2020  8:29 PM   Specimen: In/Out Cath Urine  Result Value Ref Range Status   Specimen Description IN/OUT CATH URINE  Final   Special Requests NONE  Final   Culture (A)  Final    >=100,000 COLONIES/mL ESCHERICHIA COLI SUSCEPTIBILITIES TO FOLLOW Performed at [facility], [place] [address] [place], [place] [address]    Report Status PENDING  Incomplete    Radiology Reports CT Abdomen Pelvis W Contrast  Result Date: 10/21/2020 CLINICAL DATA:  History of neuroendocrine tumor with possible infection EXAM: CT ABDOMEN AND PELVIS WITH CONTRAST TECHNIQUE: Multidetector CT imaging of the abdomen and pelvis was performed using the standard protocol following bolus administration of intravenous contrast. CONTRAST:  56mL OMNIPAQUE IOHEXOL 300 MG/ML  SOLN COMPARISON:  08/14/2020 FINDINGS: Lower chest: Lung bases demonstrate bilateral pleural effusions right greater than left increased when compared with the prior exam. The degree of right lower lobe consolidation has also increased from the prior exam. The visualized pulmonary artery demonstrates some suggestion of filling defects within the left lower lobe pulmonary arterial branches. These are incompletely evaluated on this exam. Hepatobiliary: Liver again demonstrates changes consistent with diffuse hepatic metastatic disease. The degree of decreased attenuation within the liver has increased  significantly in the interval from the prior exam consistent with progressive metastatic disease. Scattered calcifications are again identified when compared with the prior study. Previously described central area of calcification is again identified and measures approximately 17 mm. The surrounding hepatic parenchyma however demonstrates diffuse decreased attenuation consistent with further metastatic disease. Gallbladder demonstrates dependent density likely related to milk of calcium. No biliary ductal dilatation is seen. The portal vein is attenuated but patent. This is similar to that noted on the prior exam. Pancreas: Unremarkable. No pancreatic ductal dilatation or surrounding inflammatory changes. Spleen: Normal in size without focal abnormality. Adrenals/Urinary Tract: Adrenal glands are within normal limits. The kidneys are well visualize within normal enhancement pattern bilaterally. Left renal cyst is again noted and stable. Delayed images demonstrate normal enhancement. The bladder is decompressed by Foley catheter. Stomach/Bowel: The colon is predominately decompressed the appendix is air-filled and within normal limits. Small bowel demonstrates some reactive wall thickening distally related  to the associated ascites which has increased in the interval from the prior exam. No obstructive changes are seen. Vascular/Lymphatic: Atherosclerotic calcifications are seen without aneurysmal dilatation. There is again noted a partially calcified central mesenteric mass which again measures 3.5 cm in greatest dimension stable in appearance from the prior exam. Reproductive: Prostate is again somewhat heterogeneous but within normal limits. Other: Significant ascites is noted when compared with the prior exam this has increased. Musculoskeletal: Degenerative changes of lumbar spine are noted. No acute bony abnormality is seen. IMPRESSION: Changes consistent with diffuse hepatic metastatic disease which appears to  have increased in the interval from the prior exam. Increase in bilateral pleural effusions right greater than left with associated lower lobe consolidation on the right. Findings suspicious for pulmonary emboli in the left lower lobe. CTA of the chest could be performed to evaluate although felt to be best performed after short period of delay due to the patient's mildly elevated creatinine and decreased GFR. Increase in the degree of ascites consistent with the progressive metastatic disease. Stable central mesenteric mass consistent with the given clinical history. Some increased bowel wall thickening is noted in the distal small bowel. This could be related to the underlying mesenteric mass although may simply be reactive to the increase in ascites. Electronically Signed   By: Inez Catalina M.D.   On: 09/30/2020 20:34   DG Chest Port 1 View  Result Date: 10/18/2020 CLINICAL DATA:  Found unresponsive, possible sepsis EXAM: PORTABLE CHEST 1 VIEW COMPARISON:  08/14/2020 FINDINGS: Cardiac shadow is within normal limits. Increased density is noted in the right hemithorax likely related to a posteriorly layering effusion. No definitive underlying infiltrate is seen. Left lung is clear. No bony abnormality is noted. IMPRESSION: Right-sided pleural effusion. No definitive infiltrate is seen although may be obscured in the lower lobe by the effusion. Electronically Signed   By: Inez Catalina M.D.   On: 10/10/2020 17:22   DG Chest Port 1V same Day  Result Date: 10/10/2020 CLINICAL DATA:  Altered mental status.  COVID positive EXAM: PORTABLE CHEST 1 VIEW COMPARISON:  09/27/2020 FINDINGS: Elevated right hemidiaphragm with right lower lobe airspace disease and right effusion, unchanged. Mild left lower lobe airspace disease has progressed. Pulmonary vascular congestion is present suggesting fluid overload. IMPRESSION: Pulmonary vascular congestion suggesting mild fluid overload Right lower lobe airspace disease and  right effusion unchanged. Progression of left lower lobe atelectasis/infiltrate. Electronically Signed   By: Franchot Gallo M.D.   On: 10/10/2020 10:58   VAS [place] LOWER EXTREMITY VENOUS (DVT)  Result Date: 10/10/2020  Lower Venous DVT Study Indications: D-dimer, Covid-19, swelling, PE.  Risk Factors: Cancer Metastatic malignant neuroendocrine tumor to liver. Comparison Study: 07-03-2020 Prior bilateral lower extremity venous was negative                   for DVT. Performing Technologist: Darlin Coco RDMS  Examination Guidelines: A complete evaluation includes B-mode imaging, spectral Doppler, color Doppler, and power Doppler as needed of all accessible portions of each vessel. Bilateral testing is considered an integral part of a complete examination. Limited examinations for reoccurring indications may be performed as noted. The reflux portion of the exam is performed with the patient in reverse Trendelenburg.  +---------+---------------+---------+-----------+----------+--------------+ RIGHT    CompressibilityPhasicitySpontaneityPropertiesThrombus Aging +---------+---------------+---------+-----------+----------+--------------+ CFV      Partial        Yes      Yes  Acute          +---------+---------------+---------+-----------+----------+--------------+ SFJ      Partial        Yes      Yes                  Acute          +---------+---------------+---------+-----------+----------+--------------+ FV Prox  None                                         Acute          +---------+---------------+---------+-----------+----------+--------------+ FV Mid                  Yes      Yes                                 +---------+---------------+---------+-----------+----------+--------------+ FV DistalPartial        Yes      Yes                  Acute          +---------+---------------+---------+-----------+----------+--------------+ PFV      None                                          Acute          +---------+---------------+---------+-----------+----------+--------------+ POP      Partial        Yes      Yes                  Acute          +---------+---------------+---------+-----------+----------+--------------+ PTV                     No       No                                  +---------+---------------+---------+-----------+----------+--------------+ PERO                                                  Not visualized +---------+---------------+---------+-----------+----------+--------------+   +---------+---------------+---------+-----------+----------+-------------------+ LEFT     CompressibilityPhasicitySpontaneityPropertiesThrombus Aging      +---------+---------------+---------+-----------+----------+-------------------+ CFV      None           No       No                   Acute               +---------+---------------+---------+-----------+----------+-------------------+ SFJ      None                                         Acute               +---------+---------------+---------+-----------+----------+-------------------+ FV Prox                 Yes  Yes                                      +---------+---------------+---------+-----------+----------+-------------------+ FV Mid                  Yes      Yes                                      +---------+---------------+---------+-----------+----------+-------------------+ FV Distal               No       No                   Acute               +---------+---------------+---------+-----------+----------+-------------------+ PFV                     Yes      Yes                                      +---------+---------------+---------+-----------+----------+-------------------+ POP                                                   Unable to visualize                                                       due to patient                                                             position            +---------+---------------+---------+-----------+----------+-------------------+ PTV                     No       No                   Acute               +---------+---------------+---------+-----------+----------+-------------------+ PERO                                                  Not visualized      +---------+---------------+---------+-----------+----------+-------------------+    Summary: RIGHT: - Findings consistent with acute deep vein thrombosis involving the right common femoral vein, SF junction, right femoral vein, right popliteal vein, right proximal profunda vein, and right posterior tibial veins. - No cystic structure found in the popliteal fossa.  LEFT: - Findings consistent with acute deep vein thrombosis involving the left common femoral vein, SF junction, left femoral vein, and left  posterior tibial veins.  *See table(s) above for measurements and observations. Electronically signed by Monica Martinez MD on 10/10/2020 at 5:59:55 PM.    Final

## 2020-10-11 NOTE — Plan of Care (Signed)

## 2020-10-11 NOTE — Progress Notes (Signed)
Daily Progress Note   Patient Name: Charles Schmidt       Date: 10/11/2020 DOB: July 23, 1941  Age: 80 y.o. MRN#: [id number] Attending Physician: Jonetta Osgood, MD Primary Care Physician: Nolene Ebbs, MD Admit Date: 10/11/2020  Reason for Consultation/Follow-up: Establishing goals of care  Subjective: Chart view performed. Received report from primary RN - no acute concerns. RN states patient is not eating or drinking. Received report from Dr. Sloan Leiter - states he spoke with son/Charles Schmidt and provided update that patient is not improving, he also gave medical recommendation for comfort care - [name] wanted to talk the decision over with his siblings before making final decision.   Was notified by RN that family were ready to provide decision for comfort care and requested I speak with son/Charles Schmidt or [name].   Attempted to call [name] with no answer. Called [name] who placed other [name] on with her via speaker phone. I spoke with them both about their thoughts on information provided to them today from Dr. Sloan Leiter - emotional support and therapeutic listening provided as it was not the news they had been hoping for. We talked about transition to comfort measures in house and what that would entail inclusive of medications to control pain, dyspnea, agitation, nausea, itching, and hiccups. We discussed stopping all uneccessary measures such as blood draws, needle sticks, oxygen, antibiotics, CBGs/insulin, cardiac monitoring, and frequent vital signs. Family specifically asked about keeping the patient on antibiotics, heparin, oxygen, IVF, and octreotide. Discussed each of this individually and how it relates to comfort focused care. Family were not agreeable to discontinue antibiotics, heparin  (or lab work that accompanies), oxygen, or octreotide at this time but were agreeable to stop IVF and all other unnecessary measures.  Family asked if patient would still be allowed to [facility] - explained that patient would be allowed to eat/drink anything that would bring him joy - it is encouraged to provide it to him if he accepts/asks. Reviewed careful hand feeding. Also explained natural trajectory and expectations at EOL - it is to be expected people naturally become less hungry and thirsty/not eat or drink as he approaches his final days/hours - they expressed understanding.  Discussed transferring patient to residential hospice facility vs discharging home with hospice. After discussion with each other, family are not interested in discharge at this  time and would like patient to remain in house for EOL care. They expressed they may want to try and get the patient home after the winter weather hits this weekend, if the patient is still stable for transfer - explained that we would continue to provide ongoing assessments for stability for transfer if that was their goal.  Current [facility] EOL visitor policy for COVID+ patients was reviewed with family in detail - they expressed understanding.  Family expressed gratitude for call and information today.  All questions and concerns addressed. Encouraged to call with questions and/or concerns. PMT number previously provided.    Length of Stay: 2  Current Medications: Scheduled Meds:  . antiseptic oral rinse  15 mL Topical BID  . octreotide  150 mcg Subcutaneous BID    Continuous Infusions: . ceFEPime (MAXIPIME) IV 2 g (10/11/20 1747)  . heparin 2,000 Units/hr (10/11/20 1913)    PRN Meds: acetaminophen **OR** acetaminophen, glycopyrrolate **OR** glycopyrrolate **OR** glycopyrrolate, haloperidol **OR** haloperidol **OR** haloperidol lactate, LORazepam **OR** LORazepam **OR** LORazepam, morphine injection, ondansetron **OR**  ondansetron (ZOFRAN) IV, oxyCODONE, polyvinyl alcohol, sodium chloride flush    Vital Signs: BP (!) 93/59 (BP Location: Right Arm)   Pulse (!) 111   Temp 98.2 F (36.8 C) (Oral)   Resp (!) 23   Ht 6\' 4"  (1.93 m)   Wt 92 kg   SpO2 94%   BMI 24.69 kg/m  SpO2: SpO2: 94 % O2 Device: O2 Device: Nasal Cannula O2 Flow Rate: O2 Flow Rate (L/min): 4 L/min  Intake/output summary:   Intake/Output Summary (Last 24 hours) at 10/11/2020 2149 Last data filed at 10/11/2020 1836 Gross per 24 hour  Intake 1565.82 ml  Output 30 ml  Net 1535.82 ml   LBM: Last BM Date:  (unknown, patient unable to voice) Baseline Weight: Weight: 92 kg Most recent weight: Weight: 92 kg       Palliative Assessment/Data: PPS 10%    Flowsheet Rows   Flowsheet Row Most Recent Value  Intake Tab   Referral Department Hospitalist  Unit at Time of Referral ER  Palliative Care Primary Diagnosis Cancer  Date Notified 10/14/2020  Palliative Care Type Return patient Palliative Care  Reason for referral Clarify Goals of Care  Date of Admission 10/17/2020  Date first seen by Palliative Care 10/10/20  # of days Palliative referral response time 1 Day(s)  # of days IP prior to Palliative referral 0  Clinical Assessment   Psychosocial & Spiritual Assessment   Palliative Care Outcomes   Patient/Family meeting held? Yes  Who was at the meeting? patient, son, daughter  Palliative Care Outcomes Clarified goals of care, Counseled regarding hospice, Provided psychosocial or spiritual support  Patient/Family wishes: Interventions discontinued/not started  Tube feedings/TPN, Trach, PEG      Patient Active Problem List   Diagnosis Date Noted  . Lactic acidosis 10/10/2020  . COVID-19 virus infection 10/10/2020  . AKI (acute kidney injury) ([place]) 10/10/2020  . Hypotension 10/10/2020  . Complicated UTI (urinary tract infection) 10/18/2020  . Protein-calorie malnutrition, severe 08/16/2020  . Intractable vomiting   .  Diarrhea   . Carcinoid syndrome ([place])   . Syncope 08/14/2020  . Acute metabolic encephalopathy 94/70/9628  . CKD (chronic kidney disease) stage 3, GFR 30-59 ml/min (H[facility]) 08/14/2020  . Staphylococcus epidermidis bacteremia   . Pressure injury of skin 06/12/2020  . Oliguria   . Palliative care by specialist   . Goals of care, counseling/discussion   . DNR (do not resuscitate)   .  Acute renal failure ([place])   . Abdominal pain   . Elevated troponin   . Lives alone 06/06/2020  . Nonspecific elevation of levels of transaminase or lactic acid dehydrogenase (LDH) 06/06/2020  . Increased ammonia level 06/06/2020  . Direct hyperbilirubinemia 06/06/2020  . Bowel wall thickening 06/06/2020  . Vomiting, persistent, in adult 06/06/2020  . Anorexia 06/06/2020  . Colitis   . Elevated liver enzymes   . Nausea and vomiting in adult   . Dehydration   . Physical debility 06/05/2020  . Genetic testing 12/18/2017  . Metastatic malignant neuroendocrine tumor to liver ([place]) 10/28/2017  . Essential hypertension (primary) 05/04/2017  . Hypokalemia 05/04/2017  . Benign prostatic hyperplasia 05/04/2017  . Glaucoma 05/04/2017  . Benign hypertensive heart disease without heart failure 05/04/2017  . Microcytic anemia 05/04/2017  . Prediabetes 05/04/2017  . Hypocalcemia 05/04/2017  . Psoriasis 05/04/2017    Palliative Care Assessment & Plan   Patient Profile: 80 y.o. male  with past medical history of metastatic carcinoid tumor to liver, HTN, and recurrent UTI's likely due to urinary retention from BPH presented to the ED on 10/14/2020 from [facility] where staff found him unresponsive in his wheelchair. Per EMS arrival patient was alert but altered. Patient was recently diagnosed with COVID19 at rehab facility. Patient was admitted on 0/86/5784 with complicated UTI, acute metabolic encephalopathy, AKI, COVID19 infection.   Of note, PMT worked with patient during his last hospitalization in November  2021.  Assessment: Acute metabolic encephalopathy Complicated UTI AKI on CKD Acute hypoxic respiratory failure Pulmonary embolism Bilateral lower extremity DVT COVID19 pneumonia Pleural effusion Hypotension History of metastatic carcinoid Failure to thrive Frailty Deconditioning Terminal care  Recommendations/Plan: . Initiated full comfort measures - patient likely has days . Family were not agreeable to stop: antibiotics, heparin (or labs that accompany), oxygen, or octreotide . Continue DNR/DNI as previously documented . Family wish for patient to remain in house for EOL care at this time - they may consider home with hospice after winter weather hits if patient is stable for transfer at that time . Family will benefit from continued education around comfort focused/non-comfort focused interventions as well as natural trajectory/expectations for EOL - "Gone From My Sight" book would be beneficial . Added orders for EOL symptom management and to reflect full comfort measures, as well as discontinued orders that were not focused on comfort . Unrestricted visitation orders were placed per current [facility] EOL visitation policy  . Provide frequent assessments and administer PRN medications as clinically necessary to ensure EOL comfort . PMT will continue to follow holistically   Goals of Care and Additional Recommendations:  Limitations on Scope of Treatment: Full Comfort Care  Code Status:    Code Status Orders  (From admission, onward)         Start     Ordered   10/11/20 1441  Do not attempt resuscitation (DNR)  Continuous       Question Answer Comment  In the event of cardiac or respiratory ARREST Do not call a "code blue"   In the event of cardiac or respiratory ARREST Do not perform Intubation, CPR, defibrillation or ACLS   In the event of cardiac or respiratory ARREST Use medication by any route, position, wound care, and other measures to relive pain  and suffering. May use oxygen, suction and manual treatment of airway obstruction as needed for comfort.      10/11/20 1452        Code  Status History    Date Active Date Inactive Code Status Order ID Comments User Context   10/10/2020 0958 10/11/2020 1452 DNR [id number]  Jonetta Osgood, MD ED   10/10/2020 0117 10/10/2020 0958 Full Code [id number]  Howerter, Ethelda Chick, DO ED   08/19/2020 1222 08/23/2020 0235 Full Code [id number]  Lin Landsman, NP Inpatient   08/14/2020 2106 08/19/2020 1222 DNR [id number]  Dessa Phi, DO Inpatient   06/08/2020 0907 07/05/2020 1955 DNR [id number]  Rosezella Rumpf, NP Inpatient   06/05/2020 1738 06/08/2020 0906 Full Code [id number]  Danna Hefty, DO ED   Advance Care Planning Activity       Prognosis:   Hours - Days  Discharge Planning:  Anticipated Hospital Death family considering home hospice after winter weather hits  Care plan was discussed with primary RN, Dr. Sloan Leiter, patient's daughters  Thank you for allowing the Palliative Medicine Team to assist in the care of this patient.   Total Time 70 minutes Prolonged Time Billed  yes       Greater than 50%  of this time was spent counseling and coordinating care related to the above assessment and plan.  Lin Landsman, NP  Please contact Palliative Medicine Team phone at [phone] for questions and concerns.

## 2020-10-11 NOTE — Progress Notes (Signed)
  Echocardiogram 2D Echocardiogram has been performed.  Angelynn Lemus G Levern Kalka 10/11/2020, 11:14 AM

## 2020-10-11 NOTE — Progress Notes (Signed)
ANTICOAGULATION CONSULT NOTE - Follow-Up  Pharmacy Consult for heparin Indication: pulmonary embolus  No Known Allergies  Patient Measurements: Height: 6\' 4"  (193 cm) Weight: 92 kg (202 lb 12.8 oz) IBW/kg (Calculated) : 86.8 Heparin Dosing Weight: 92kg  Vital Signs: Temp: 97.9 F (36.6 C) (01/20 0554) Temp Source: Axillary (01/20 0554) BP: 92/57 (01/20 0900) Pulse Rate: 117 (01/20 0900)  Labs: Recent Labs    10/01/2020 1644 10/01/2020 1755 10/01/2020 1755 10/10/20 0213 10/10/20 0230 10/10/20 0418 10/10/20 2218 10/11/20 0827  HGB  --  9.3*   < > 11.6* 9.8*  --   --   --   HCT  --  27.5*  --  34.0* 29.5*  --   --   --   PLT  --  124*  --   --  122*  --   --   --   APTT 31  --   --   --   --   --   --   --   LABPROT 17.8*  --   --   --  19.7*  --   --   --   INR 1.5*  --   --   --  1.7*  --   --   --   HEPARINUNFRC  --   --   --   --   --   --  <0.10* 0.17*  CREATININE  --  1.63*  --   --   --  1.55*  --   --    < > = values in this interval not displayed.    Estimated Creatinine Clearance: 47.4 mL/min (A) (by C-G formula based on SCr of 1.55 mg/dL (H)).  Assessment: 80 yo M presented to the ED with AMS. Found to be COVID + and have a PE.  Pt was started on IV heparin 1/19 evening. Baseline Hgb is low at 9.8 and platelets are also low. INR is elevated but likely related to liver disease. He is not on anticoagulation PTA.    Heparin level subtherapeutic (0.17) on 1500 units/hr.  No bleeding noted.  Goal of Therapy:  Heparin level 0.3-0.7 units/ml Monitor platelets by anticoagulation protocol: Yes   Plan:  Increase heparin gtt 1800 units/hr Check a 6 hr heparin level  [employer], Pharm.D., BCPS Clinical Pharmacist  **Pharmacist phone directory can be found on [email or web address] listed under [place].  10/11/2020 11:13 AM

## 2020-10-11 NOTE — Evaluation (Addendum)
Physical Therapy Evaluation Patient Details Name: Charles Schmidt MRN: [id number] DOB: 1941-04-08 Today's Date: 10/11/2020   History of Present Illness  80 yo admitted from SNF with AMS and metabolic encephalopathy with UTI and Covid +. Bil LE DVT. PMhx: HTN, carcinoid tumor with metatstasis, diarrhea, BPH with chronic foley  Clinical Impression  Pt lethargic with eyes partially open throughout session. Pt non verbal throughout and very minimal movement or engagement during session. Pt with significant change of baseline being able to transfer with assist. Pt with decreased cognition, strength, function and communication who will benefit from trial basis of acute therapy to maximize function to decrease burden of care.   HR 111 SpO2 94% on 4L    Follow Up Recommendations SNF    Equipment Recommendations  None recommended by PT    Recommendations for Other Services       Precautions / Restrictions Precautions Precautions: Fall      Mobility  Bed Mobility Overal bed mobility: Needs Assistance Bed Mobility: Supine to Sit;Sit to Supine     Supine to sit: HOB elevated;Total assist Sit to supine: Total assist   General bed mobility comments: HOB 35 degrees with total assist to move legs to EOB, pt attempting to use LUE pulling on therapist to lift trunk but minor effort and required total assist to lift trunk from surface and unable to maintain sitting. REturn to supine pt attempting to lift left leg but only able to elevate slightly and required total assist to return to supine, slide toward [facility] then position in semi chair with bed functions.    Transfers                 General transfer comment: unable  Ambulation/Gait                Stairs            Wheelchair Mobility    Modified Rankin (Stroke Patients Only)       Balance Overall balance assessment: Needs assistance   Sitting balance-Leahy Scale: Zero Sitting balance - Comments: total assist  for sitting balance EOB                                     Pertinent Vitals/Pain Pain Assessment:  (CPOT = 0) Faces Pain Scale: No hurt    Home Living Family/patient expects to be discharged to:: Skilled nursing facility                      Prior Function Level of Independence: Needs assistance   Gait / Transfers Assistance Needed: limited gait with assist, transfers to chair with assist, WC around facility  ADL's / Homemaking Assistance Needed: assist for all ADLs and IADLs        Hand Dominance        Extremity/Trunk Assessment   Upper Extremity Assessment Upper Extremity Assessment: Difficult to assess due to impaired cognition    Lower Extremity Assessment Lower Extremity Assessment: Difficult to assess due to impaired cognition    Cervical / Trunk Assessment Cervical / Trunk Assessment: Kyphotic  Communication   Communication: Other (comment) (pt not significantly responding today other than minimal head nods)  Cognition Arousal/Alertness: Lethargic Behavior During Therapy: Flat affect Overall Cognitive Status: No family/caregiver present to determine baseline cognitive functioning  General Comments: pt with eyes half open throughout session. Pt non verbal throughout with head nod to wanting to get up and if comfortable, no other response. Pt moving LUE and LLE minimally with transfer to EOB but no significant command following      General Comments      Exercises General Exercises - Lower Extremity Heel Slides: PROM;Both;Supine;5 reps Hip ABduction/ADduction: PROM;Both;Supine;5 reps   Assessment/Plan    PT Assessment Patient needs continued PT services  PT Problem List Decreased strength;Decreased mobility;Decreased range of motion;Decreased coordination;Decreased activity tolerance;Decreased cognition;Decreased balance;Decreased knowledge of use of DME       PT Treatment  Interventions DME instruction;Therapeutic exercise;Balance training;Functional mobility training;Therapeutic activities;Patient/family education;Cognitive remediation;Gait training    PT Goals (Current goals can be found in the Care Plan section)  Acute Rehab PT Goals PT Goal Formulation: Patient unable to participate in goal setting Time For Goal Achievement: 10/25/20 Potential to Achieve Goals: Fair    Frequency Min 2X/week (trial)   Barriers to discharge        Co-evaluation               AM-PAC PT "6 Clicks" Mobility  Outcome Measure Help needed turning from your back to your side while in a flat bed without using bedrails?: Total Help needed moving from lying on your back to sitting on the side of a flat bed without using bedrails?: Total Help needed moving to and from a bed to a chair (including a wheelchair)?: Total Help needed standing up from a chair using your arms (e.g., wheelchair or bedside chair)?: Total Help needed to walk in hospital room?: Total Help needed climbing 3-5 steps with a railing? : Total 6 Click Score: 6    End of Session   Activity Tolerance: Patient limited by lethargy Patient left: in bed;with call bell/phone within reach;with bed alarm set Nurse Communication: Mobility status;Need for lift equipment PT Visit Diagnosis: Other abnormalities of gait and mobility (R26.89);Muscle weakness (generalized) (M62.81);Adult, failure to thrive (R62.7)    Time: 1243-1300 PT Time Calculation (min) (ACUTE ONLY): 17 min   Charges:   PT Evaluation $PT Eval Moderate Complexity: 1 Mod          Yutaka Holberg P, PT Acute Rehabilitation Services Pager: [phone] Office: [phone]   Sandy Salaam Pratt Bress 10/11/2020, 1:46 PM

## 2020-10-11 NOTE — Progress Notes (Signed)
Patient is withdrawn. Patient refusing to eat or drink. When trying to place spoon or cup to patient mouth, patient turns head from side to side.

## 2020-10-11 NOTE — Progress Notes (Signed)
Upon arrival to the floor, Pt BS was 26, reached out to oncall MD, obtained one time order for dextrose 1 Ampule and IVF. See Mar for orders.

## 2020-10-11 NOTE — Progress Notes (Signed)
ANTICOAGULATION CONSULT NOTE  Pharmacy Consult for heparin Indication: pulmonary embolus  No Known Allergies  Patient Measurements: Height: 6\' 4"  (193 cm) Weight: 92 kg (202 lb 12.8 oz) IBW/kg (Calculated) : 86.8 Heparin Dosing Weight: 92kg  Vital Signs: Temp: 98 F (36.7 C) (01/19 1842) BP: 84/51 (01/19 2145) Pulse Rate: 108 (01/19 2145)  Labs: Recent Labs    10/03/2020 1644 09/25/2020 1755 10/20/2020 1755 10/10/20 0213 10/10/20 0230 10/10/20 0418 10/10/20 2218  HGB  --  9.3*   < > 11.6* 9.8*  --   --   HCT  --  27.5*  --  34.0* 29.5*  --   --   PLT  --  124*  --   --  122*  --   --   APTT 31  --   --   --   --   --   --   LABPROT 17.8*  --   --   --  19.7*  --   --   INR 1.5*  --   --   --  1.7*  --   --   HEPARINUNFRC  --   --   --   --   --   --  <0.10*  CREATININE  --  1.63*  --   --   --  1.55*  --    < > = values in this interval not displayed.    Estimated Creatinine Clearance: 47.4 mL/min (A) (by C-G formula based on SCr of 1.55 mg/dL (H)).  Assessment: 25 yom presented to the ED with AMS. Found to have a PE and now starting IV heparin. Baseline Hgb is low at 9.8 and platelets are also low. INR is elevated but likely related to liver disease. He is not on anticoagulation PTA.    Heparin bolus given 1/19 ~1200 and heparin gtt started. Heparin IV was kinked and came out and multiple attempts to replace it were tried. IV teams was called and line replaced at some point. Heparin off 1/19 1830-2230. Heparin level checked during this time and obviously undetectable.  Goal of Therapy:  Heparin level 0.3-0.7 units/ml Monitor platelets by anticoagulation protocol: Yes   Plan:  Heparin rebolus 5000 units IV x 1 Continue heparin gtt 1500 units/hr Check a 8 hr heparin level  Sherlon Handing, PharmD, BCPS Please see amion for complete clinical pharmacist phone list 10/11/2020,12:13 AM

## 2020-10-11 NOTE — Evaluation (Signed)
Clinical/Bedside Swallow Evaluation Patient Details  Name: Charles Schmidt MRN: [id number] Date of Birth: 05/16/1941  Today's Date: 10/11/2020 Time: SLP Start Time (ACUTE ONLY): 1108 SLP Stop Time (ACUTE ONLY): 1130 SLP Time Calculation (min) (ACUTE ONLY): 22 min  Past Medical History:  Past Medical History:  Diagnosis Date  . Bladder diverticulum 06/06/2020  . BPH with obstruction/lower urinary tract symptoms   . Cataract, mature    12-28-2017  per pt right eye, legally blind  . Diarrhea concurrent with and due to carcinoid syndrome ([place])    12-28-2017 improvement since started Sandostatin monthly  . ED (erectile dysfunction)   . Elevated PSA   . Family history of cancer in son   . Family history of prostate cancer   . Foley catheter in place   . Glaucoma, left eye   . History of colon polyps   . History of urinary retention    2015; 2016; 01/ 2019  . Hypertension   . Metastatic malignant neuroendocrine tumor to liver [facility]) oncoloigst-  dr Benay Spice   dx 10-23-2017 by liver bx-- WHO grade 2; small bowel mesenteric mass, cirrhosis, mets left lower lobe solitary nodule ,  periaortic and paraspinal lymph nodes METS--  started monthly Sandostatin 11-20-2017  . Microcytic anemia   . RBBB (right bundle branch block with left anterior fascicular block)   . Urinary retention   . Venous reflux    Past Surgical History:  Past Surgical History:  Procedure Laterality Date  . INGUINAL HERNIA REPAIR  1990s   unsure side  . IR US GUIDE BX ASP/DRAIN  10/23/2017  . QUADRICEPS TENDON REPAIR Left 01-08-2011   dr Veverly Fells  . THULIUM LASER TURP (TRANSURETHRAL RESECTION OF PROSTATE) N/A 01/05/2018   Procedure: Charles Schmidt LASER OF THE PROSTATE;  Surgeon: Festus Aloe, MD;  Location: [facility];  Service: Urology;  Laterality: N/A;  . TONSILLECTOMY  child   HPI:  Pt is a 80 y.o. male with medical history significant for malignant carcinoid tumor with metastasis to the liver, essential  hypertension, urinary retention in setting of BPH, anemia of chronic disease with baseline hemoglobin 8.5-10.0, who was admitted to [facility] on 05/01/1750 with complicated UTI after presenting from SNF for evaluation of AMS. Pt found to have COVID-19. CXR 1/19: Right lower lobe airspace disease and right effusion unchanged. Progression of left lower lobe atelectasis/infiltrate. CT abdomen 1/18: Diffuse hepatic metastatic disease-increased compared to prior exam.  Increased bilateral pleural effusions-with RLL>LLL consolidation.  Suspicious pulmonary emboli in left lower lobe. Increase in degree of ascites. Pt's clinical status described as "tenuous" by referring MD (i.e., Dr. Nena Alexander) and pt's family would like "gentle medical treatment without further escalation".   Assessment / Plan / Recommendation Clinical Impression  Pt was seen for bedside swallow evaluation. He was adequately alert, but did not communicate verbally or follow commands. Pt did not allow adequate oral inspection and did not participate in an oral mechanism exam. He presented with symptoms or oropharyngeal dysphagia characterized by reduced signs of aspiration with thin liquids and reduced bolus awareness with absent mastication of solids. Considering current goals, a modified barium swallow study will be deferred. A dysphagia 1 (puree) diet with nectar thick liquids is recommended at this time and SLP will follow to assess diet tolerance. Referring MD and RN have been advised of results and recommendations.  SLP Visit Diagnosis: Dysphagia, unspecified (R13.10)    Aspiration Risk  Mild aspiration risk;Moderate aspiration risk    Diet Recommendation Dysphagia  1 (Puree);Nectar-thick liquid   Liquid Administration via: Cup;Straw Medication Administration: Crushed with puree Supervision: Staff to assist with self feeding;Full supervision/cueing for compensatory strategies Compensations: Minimize environmental  distractions;Small sips/bites;Slow rate Postural Changes: Seated upright at 90 degrees;Remain upright for at least 30 minutes after po intake    Other  Recommendations Oral Care Recommendations: Oral care BID   Follow up Recommendations  (TBD)      Frequency and Duration min 2x/week  2 weeks       Prognosis Prognosis for Safe Diet Advancement: Fair Barriers to Reach Goals: Cognitive deficits;Severity of deficits      Swallow Study   General Date of Onset: 10/10/20 HPI: Pt is a 80 y.o. male with medical history significant for malignant carcinoid tumor with metastasis to the liver, essential hypertension, urinary retention in setting of BPH, anemia of chronic disease with baseline hemoglobin 8.5-10.0, who was admitted to [facility] on 1/82/9937 with complicated UTI after presenting from SNF for evaluation of AMS. Pt found to have COVID-19. CXR 1/19: Right lower lobe airspace disease and right effusion unchanged. Progression of left lower lobe atelectasis/infiltrate. CT abdomen 1/18: Diffuse hepatic metastatic disease-increased compared to prior exam.  Increased bilateral pleural effusions-with RLL>LLL consolidation.  Suspicious pulmonary emboli in left lower lobe. Increase in degree of ascites. Pt's clinical status described as "tenuous" by referring MD (i.e., Dr. Nena Alexander) and pt's family would like "gentle medical treatment without further escalation". Type of Study: Bedside Swallow Evaluation Previous Swallow Assessment: None Diet Prior to this Study: Thin liquids (clear liquids) Temperature Spikes Noted: No Respiratory Status: Nasal cannula History of Recent Intubation: No Oral Cavity Assessment: Within Functional Limits Oral Care Completed by SLP: No Vision: Functional for self-feeding Self-Feeding Abilities: Total assist Patient Positioning: Upright in bed;Postural control adequate for testing Volitional Cough: Cognitively unable to elicit Volitional Swallow: Unable  to elicit    Oral/Motor/Sensory Function Overall Oral Motor/Sensory Function:  (Pt did not participate in assessment)   Ice Chips Ice chips: Within functional limits Presentation: Spoon   Thin Liquid Thin Liquid: Impaired Presentation: Straw;Cup Pharyngeal  Phase Impairments: Cough - Immediate;Cough - Delayed    Nectar Thick Nectar Thick Liquid: Within functional limits Presentation: Straw   Honey Thick Honey Thick Liquid: Not tested   Puree Puree: Within functional limits Presentation: Spoon   Solid     Solid: Impaired Presentation: Self Fed Oral Phase Impairments: Poor awareness of bolus;Impaired mastication     May Manrique I. Hardin Negus, [place], [place] Office number [phone] Pager [phone]  Horton Marshall 10/11/2020,12:11 PM

## 2020-10-11 NOTE — Progress Notes (Signed)
ANTICOAGULATION CONSULT NOTE - Follow-Up  Pharmacy Consult for heparin Indication: pulmonary embolus  No Known Allergies  Patient Measurements: Height: 6\' 4"  (193 cm) Weight: 92 kg (202 lb 12.8 oz) IBW/kg (Calculated) : 86.8 Heparin Dosing Weight: 92kg  Vital Signs: Temp: 98.2 F (36.8 C) (01/20 1133) Temp Source: Oral (01/20 1133) BP: 93/59 (01/20 1133) Pulse Rate: 111 (01/20 1336)  Labs: Recent Labs    10/08/2020 1644 09/25/2020 1755 09/24/2020 1755 10/10/20 0213 10/10/20 0230 10/10/20 0418 10/10/20 2218 10/11/20 0827 10/11/20 1745  HGB  --  9.3*   < > 11.6* 9.8*  --   --  8.2*  --   HCT  --  27.5*   < > 34.0* 29.5*  --   --  22.8*  --   PLT  --  124*  --   --  122*  --   --  91*  --   APTT 31  --   --   --   --   --   --   --   --   LABPROT 17.8*  --   --   --  19.7*  --   --   --   --   INR 1.5*  --   --   --  1.7*  --   --   --   --   HEPARINUNFRC  --   --   --   --   --   --  <0.10* 0.17* 0.23*  CREATININE  --  1.63*  --   --   --  1.55*  --  2.37*  --    < > = values in this interval not displayed.    Estimated Creatinine Clearance: 31 mL/min (A) (by C-G formula based on SCr of 2.37 mg/dL (H)).  Assessment: 80 yo M presented to the ED with AMS. Found to be COVID + and have a PE.  Pt was started on IV heparin 1/19 evening. Baseline Hgb is low at 9.8 and platelets are also low. INR is elevated (up to 1.7) but likely related to liver disease. He is not on anticoagulation PTA.    Heparin level improved but remains subtherapeutic (0.23) after rate increase this AM. Hg down to 8.2, plt down to 91 today. Noted AKI - SCr up to 2.37. No bleeding or issues with infusion per discussion with RN.  Goal of Therapy:  Heparin level 0.3-0.7 units/ml Monitor platelets by anticoagulation protocol: Yes   Plan:  Increase heparin IV to 2000 units/hr Check 8hr heparin level Monitor daily CBC - watch trend closely, s/sx bleeding   Arturo Morton, PharmD, BCPS Please check  AMION for all [place] contact numbers Clinical Pharmacist 10/11/2020 6:51 PM

## 2020-10-12 ENCOUNTER — Inpatient Hospital Stay: Payer: Medicare Other

## 2020-10-12 ENCOUNTER — Inpatient Hospital Stay: Payer: Medicare Other | Attending: Oncology | Admitting: Nurse Practitioner

## 2020-10-12 DIAGNOSIS — U071 COVID-19: Secondary | ICD-10-CM | POA: Diagnosis not present

## 2020-10-12 DIAGNOSIS — N179 Acute kidney failure, unspecified: Secondary | ICD-10-CM | POA: Diagnosis not present

## 2020-10-12 DIAGNOSIS — N390 Urinary tract infection, site not specified: Secondary | ICD-10-CM | POA: Diagnosis not present

## 2020-10-12 DIAGNOSIS — C799 Secondary malignant neoplasm of unspecified site: Secondary | ICD-10-CM | POA: Diagnosis not present

## 2020-10-12 LAB — CBC
HCT: 22.7 % — ABNORMAL LOW (ref 39.0–52.0)
Hemoglobin: 8.2 g/dL — ABNORMAL LOW (ref 13.0–17.0)
MCH: 25.9 pg — ABNORMAL LOW (ref 26.0–34.0)
MCHC: 36.1 g/dL — ABNORMAL HIGH (ref 30.0–36.0)
MCV: 71.6 fL — ABNORMAL LOW (ref 80.0–100.0)
Platelets: 98 10*3/uL — ABNORMAL LOW (ref 150–400)
RBC: 3.17 MIL/uL — ABNORMAL LOW (ref 4.22–5.81)
RDW: 20.3 % — ABNORMAL HIGH (ref 11.5–15.5)
WBC: 5.7 10*3/uL (ref 4.0–10.5)
nRBC: 0.5 % — ABNORMAL HIGH (ref 0.0–0.2)

## 2020-10-12 LAB — PATHOLOGIST SMEAR REVIEW

## 2020-10-12 LAB — HEPARIN LEVEL (UNFRACTIONATED): Heparin Unfractionated: 0.21 IU/mL — ABNORMAL LOW (ref 0.30–0.70)

## 2020-10-12 MED ORDER — OXYCODONE HCL 5 MG/5ML PO SOLN
5.0000 mg | Freq: Four times a day (QID) | ORAL | Status: DC
  Administered 2020-10-13 (×3): 5 mg via ORAL
  Filled 2020-10-12 (×3): qty 5

## 2020-10-12 MED ORDER — OXYCODONE HCL 5 MG/5ML PO SOLN
5.0000 mg | Freq: Four times a day (QID) | ORAL | Status: DC
  Administered 2020-10-12: 5 mg via ORAL
  Filled 2020-10-12: qty 5

## 2020-10-12 NOTE — Plan of Care (Signed)

## 2020-10-12 NOTE — Progress Notes (Signed)
PROGRESS NOTE                                                                                                                                                                                                             Patient Demographics:    Charles Schmidt, is a 80 y.o. male, DOB - 01/17/1941, TOI:[id number]  Outpatient Primary MD for the patient is Nolene Ebbs, MD   Admit date - 10/22/2020   LOS - 3  Chief Complaint  Patient presents with   Altered Mental Status       Brief Narrative: Patient is a 80 y.o. male with PMHx of metastatic carcinoid tumor, diarrhea related to carcinoid, BPH-with indwelling chronic Foley catheter, recurrent UTI-resident of SNF-who apparently was COVID-positive approximately 2 weeks back (was in isolation at SNF)-presenting with altered mental status-found to have acute metabolic encephalopathy in the context of COVID 19 infection/bacterial pneumonia and complicated UTI.  COVID-19 vaccinated status:   Significant Events: 1/18>> Admit to [facility] for metabolic encephalopathy-complicated UTI, IPJAS-50 infection.  Hypotension requiring multiple fluid boluses. 1/19>> persistent hypotension/worsening hypoxia-started on midodrine-IV fluid boluses-on 3-4 L of O2.  Significant studies: 1/18>> CT abdomen/pelvis with contrast: Diffuse hepatic metastatic disease-increased compared to prior exam.  Increased bilateral pleural effusions-with RLL>> LLL consolidation.  Suspicious pulmonary emboli in left lower lobe.  Stable central mesenteric mass consistent with history of carcinoid.  Increase in degree of ascites. 1/18>> chest x-ray: Right-sided pleural effusion, no definite infiltrate seen. 1/19>> bilateral lower extremity Doppler:+ve DVT   COVID-19 medications: Remdesivir: 1/18 x 1  Antibiotics: Cefepime: 1/18>>  Microbiology data: 1/18 >>blood culture: No growth 1/18>> urine culture: E. coli,  Pseudomonas.-sensitivities pending. 08/14/20>> urine culture: Pansensitive Pseudomonas  Procedures: None  Consults: Palliative care, oncology  DVT prophylaxis: IV heparin infusion.    Subjective:   Barely arousable-no p.o. intake for almost 3 days.   Assessment  & Plan :   Acute metabolic encephalopathy: Multifactorial etiology-secondary to complicated UTI, hypotension/hypoxia.  No clinical improvement-has been transitioned to full comfort measures.  Complicated UTI: Has chronic indwelling Foley catheter likely contributing to recurrent UTIs.  Has prior history of Pseudomonas UTI-urine cultures growing E. coli and Pseudomonas-although has been transitioned to comfort measures-family wishes to continue cefepime for a few more days  AKI on CKD stage IIIa: AKI likely hemodynamically mediated due to COVID-19 infection/poor oral  intake, UTI and probably diarrhea related to carcinoid.  Unfortunately-with supportive care-renal function continues to worsen.  Not a candidate for aggressive care including RRT-after discussion by this MD and by palliative care team with family-he has been transitioned to comfort measures.  Acute hypoxic respiratory failure due to pulmonary embolism, HCAP/possible COVID-19 pneumonia and pleural effusion: Supportive care continues-on comfort care measures.  PE/bilateral lower extremity DVT: Provoked by COVID-19-poor functional status-underlying malignancy.  He has liver metastases-and INR is already at 1.7.  He appears incredibly frail-debilitated-underlying prognosis appears very poor-apart from IV heparin-he was felt not to be a candidate for any further escalation in care including lytics/catheter guided therapy.  I spoke to the patient's son today-explained that IV heparin was not really helping the patient at this stage-with frequent CBC/heparin levels are probably causing him pain.  Patient's son Mark-agreed to stop anticoagulation.  Hypotension: Suspect this  is multifactorial-related to hypovolemia in the setting of diarrhea due to carcinoid-probably bedbound status/chronic debility/frailty also contributing.  Random cortisol level appropriate.  Initially on midodrine-but that has been discontinued-on full comfort measures.  COVID-19 infection: No significant groundglass opacities/COVID-19 related changes seen on imaging studies.  Since this has been ongoing for 2 weeks-not sure if Remdesivir has any role-further more-due to significantly elevated LFTs-Remdesivir discontinued.  Continue full comfort measures  History of metastatic carcinoid: Discussed with patient's oncologist-has had significant issues with debility/multiple hospitalizations-but had recently improved somewhat-now back in the hospital with above noted issues.  Awaiting input from patient's primary oncologist-but I did discuss with him yesterday-agrees with no CODE BLUE and gentle medical treatment.  Continue octreotide injection for now as per family wishes-although he is under full comfort measures.  Chronic urinary retention-chronic indwelling Foley catheter-BPH: Foley catheter in place-continue for comfort.  No longer on Flomax/finasteride as has been transitioned to full comfort measures.   Poor oral intake/failure to thrive syndrome: Very frail/debilitated baseline-now with further worsening due to acute infection and other issues as outlined above.  Per nursing staff-no significant oral intake for past 3 days.  Appears to be actively dying-see below.  Palliative care: Incredibly frail-debilitated-SNF resident-with metastatic carcinoid-now with COVID infection resulting in PE/DVT-hypotensive-hypoxic-given his frailty (albumin of 1.6!!)-tenuous clinical status-after extensive discussion with patient's son [name] over the phone on 1/19-given poor prognosis-he was made a DNR.  With ongoing discussion with family on 1/20 by myself and by the palliative care team-patient was transitioned to  full comfort measures.  He appears to be very lethargic and actively dying-no oral intake for the past 3 days-palliative care following-suspect inpatient death in a few days.        Component Value Date/Time   BNP 82.4 08/14/2020 1020    GI prophylaxis: PPI  ABG:    Component Value Date/Time   HCO3 21.3 10/10/2020 0213   TCO2 23 10/10/2020 0213   ACIDBASEDEF 4.0 (H) 10/10/2020 0213   O2SAT 99.0 10/10/2020 0213    Vent Settings: N/A   Condition - Extremely Guarded  Family Communication  : Son-[phone]-updated over the phone 1/20  Code Status :  DNR  Diet :  Diet Order            DIET - DYS 1 Room service appropriate? No; Fluid consistency: Nectar Thick  Diet effective now                  Disposition Plan  :   Status is: Inpatient  Remains inpatient appropriate because:Inpatient level of care appropriate due to severity of illness  Dispo: The patient is from: SNF              Anticipated d/c is to: TBD              Anticipated d/c date is: > 3 days              Patient currently is not medically stable to d/c.    Barriers to discharge: Comfort care-actively dying-awaiting further recommendations from palliative care regarding disposition but suspect inpatient death.  Antimicorbials  :    Anti-infectives (From admission, onward)   Start     Dose/Rate Route Frequency Ordered Stop   10/11/20 1000  remdesivir 100 mg in sodium chloride 0.9 % 100 mL IVPB  Status:  Discontinued       "Followed by" Linked Group Details   100 mg 200 mL/hr over 30 Minutes Intravenous Daily 10/10/20 0123 10/10/20 1121   10/10/20 0500  ceFEPIme (MAXIPIME) 2 g in sodium chloride 0.9 % 100 mL IVPB        2 g 200 mL/hr over 30 Minutes Intravenous Every 12 hours 10/19/2020 1947     10/10/20 0200  remdesivir 200 mg in sodium chloride 0.9% 250 mL IVPB       "Followed by" Linked Group Details   200 mg 580 mL/hr over 30 Minutes Intravenous Once 10/10/20 0123 10/10/20 0323    10/02/2020 1700  ceFEPIme (MAXIPIME) 2 g in sodium chloride 0.9 % 100 mL IVPB        2 g 200 mL/hr over 30 Minutes Intravenous  Once 10/12/2020 1651 09/22/2020 1800      Inpatient Medications  Scheduled Meds:  antiseptic oral rinse  15 mL Topical BID   octreotide  150 mcg Subcutaneous BID   Continuous Infusions:  ceFEPime (MAXIPIME) IV 2 g (10/12/20 0453)   heparin 2,300 Units/hr (10/12/20 0618)   PRN Meds:.   Time Spent in minutes  35  See all Orders from today for further details   Oren Binet M.D on 10/12/2020 at 1:25 PM  To page go to [email or web address] - use universal password  [facility] -  Office  [phone]    Objective:   Vitals:   10/11/20 1133 10/11/20 1336 10/12/20 0626 10/12/20 1223  BP: (!) 93/59  126/86 107/76  Pulse: (!) 112 (!) 111 (!) 108 96  Resp: (!) 23  20 16   Temp: 98.2 F (36.8 C)  97.9 F (36.6 C) (!) 97.3 F (36.3 C)  TempSrc: Oral  Axillary   SpO2: 93% 94%  100%  Weight:      Height:        Wt Readings from Last 3 Encounters:  10/10/20 92 kg  08/31/20 92 kg  08/21/20 97.2 kg     Intake/Output Summary (Last 24 hours) at 10/12/2020 1325 Last data filed at 10/12/2020 0648 Gross per 24 hour  Intake --  Output 230 ml  Net -230 ml     Physical Exam Appears comfortable.   Data Review:    CBC Recent Labs  Lab 10/02/2020 1755 10/10/20 0213 10/10/20 0230 10/11/20 0827 10/12/20 0438  WBC 4.9  --  7.0 4.3 5.7  HGB 9.3* 11.6* 9.8* 8.2* 8.2*  HCT 27.5* 34.0* 29.5* 22.8* 22.7*  PLT 124*  --  122* 91* 98*  MCV 77.2*  --  75.4* 72.6* 71.6*  MCH 26.1  --  25.1* 26.1 25.9*  MCHC 33.8  --  33.2 36.0 36.1*  RDW 21.6*  --  22.6* 21.0* 20.3*  LYMPHSABS 1.1  --  0.5*  --   --   MONOABS 0.2  --  0.4  --   --   EOSABS 0.0  --  0.0  --   --   BASOSABS 0.0  --  0.0  --   --     Chemistries  Recent Labs  Lab 09/24/2020 1755 10/10/20 0213 10/10/20 0418 10/11/20 0827  NA 141 136 140 144  K 4.2 >8.5* 4.4 5.2*  CL 107  --   107 111  CO2 18*  --  18* 17*  GLUCOSE 164*  --  105* 88  BUN 18  --  18 26*  CREATININE 1.63*  --  1.55* 2.37*  CALCIUM 8.0*  --  8.0* 7.6*  MG  --   --  1.8  --   AST 65*  --  537* 710*  ALT 23  --  54* 112*  ALKPHOS 198*  --  175* 154*  BILITOT 1.1  --  1.6* 1.6*   ------------------------------------------------------------------------------------------------------------------ No results for input(s): CHOL, HDL, LDLCALC, TRIG, CHOLHDL, LDLDIRECT in the last 72 hours.  No results found for: HGBA1C ------------------------------------------------------------------------------------------------------------------ Recent Labs    10/10/20 0230  TSH 2.509   ------------------------------------------------------------------------------------------------------------------ Recent Labs    10/10/20 0230  FERRITIN >7,500*    Coagulation profile Recent Labs  Lab 10/19/2020 1644 10/10/20 0230  INR 1.5* 1.7*    No results for input(s): DDIMER in the last 72 hours.  Cardiac Enzymes No results for input(s): CKMB, TROPONINI, MYOGLOBIN in the last 168 hours.  Invalid input(s): CK ------------------------------------------------------------------------------------------------------------------    Component Value Date/Time   BNP 82.4 08/14/2020 1020    Micro Results Recent Results (from the past 240 hour(s))  Blood Culture (routine x 2)     Status: None (Preliminary result)   Collection Time: 10/01/2020  5:20 PM   Specimen: BLOOD RIGHT ARM  Result Value Ref Range Status   Specimen Description BLOOD RIGHT ARM  Final   Special Requests   Final    BOTTLES DRAWN AEROBIC AND ANAEROBIC Blood Culture adequate volume   Culture   Final    NO GROWTH 3 DAYS Performed at [facility], [place] [address], [place], [place] [address]    Report Status PENDING  Incomplete  Blood Culture (routine x 2)     Status: None (Preliminary result)   Collection Time: 09/26/2020  5:30 PM   Specimen:  BLOOD RIGHT ARM  Result Value Ref Range Status   Specimen Description BLOOD RIGHT ARM  Final   Special Requests   Final    BOTTLES DRAWN AEROBIC AND ANAEROBIC Blood Culture results may not be optimal due to an inadequate volume of blood received in culture bottles   Culture   Final    NO GROWTH 3 DAYS Performed at [facility], [place] [address], [place], [place] [address]    Report Status PENDING  Incomplete  SARS CORONAVIRUS 2 (TAT 6-24 HRS) Nasopharyngeal Nasopharyngeal Swab     Status: Abnormal   Collection Time: 10/04/2020  7:40 PM   Specimen: Nasopharyngeal Swab  Result Value Ref Range Status   SARS Coronavirus 2 POSITIVE (A) NEGATIVE Final    Comment: (NOTE) SARS-CoV-2 target nucleic acids are DETECTED.  The SARS-CoV-2 RNA is generally detectable in upper and lower respiratory specimens during the acute phase of infection. Positive results are indicative of the presence of SARS-CoV-2 RNA. Clinical correlation with patient history and other diagnostic information is  necessary to determine  patient infection status. Positive results do not rule out bacterial infection or co-infection with other viruses.  The expected result is Negative.  Fact Sheet for Patients: SugarRoll.be  Fact Sheet for Healthcare Providers: https://www.woods-mathews.com/  This test is not yet approved or cleared by the [place] FDA and  has been authorized for detection and/or diagnosis of SARS-CoV-2 by FDA under an Emergency Use Authorization (EUA). This EUA will remain  in effect (meaning this test can be used) for the duration of the COVID-19 declaration under Section 564(b)(1) of the Act, 21 U. S.C. section 360bbb-3(b)(1), unless the authorization is terminated or revoked sooner.   Performed at [facility], [place] [address], [place], [place] [address]   Urine culture     Status: Abnormal (Preliminary result)   Collection Time: 10/04/2020  8:29  PM   Specimen: In/Out Cath Urine  Result Value Ref Range Status   Specimen Description IN/OUT CATH URINE  Final   Special Requests   Final    NONE Performed at [facility], [place] [address] [place], [place] [address]    Culture (A)  Final    >=100,000 COLONIES/mL ESCHERICHIA COLI >=100,000 COLONIES/mL PSEUDOMONAS AERUGINOSA    Report Status PENDING  Incomplete   Organism ID, Bacteria ESCHERICHIA COLI (A)  Final      Susceptibility   Escherichia coli - MIC*    AMPICILLIN <=2 SENSITIVE Sensitive     CEFAZOLIN <=4 SENSITIVE Sensitive     CEFEPIME <=0.12 SENSITIVE Sensitive     CEFTRIAXONE <=0.25 SENSITIVE Sensitive     CIPROFLOXACIN <=0.25 SENSITIVE Sensitive     GENTAMICIN <=1 SENSITIVE Sensitive     IMIPENEM <=0.25 SENSITIVE Sensitive     NITROFURANTOIN <=16 SENSITIVE Sensitive     TRIMETH/SULFA <=20 SENSITIVE Sensitive     AMPICILLIN/SULBACTAM <=2 SENSITIVE Sensitive     PIP/TAZO <=4 SENSITIVE Sensitive     * >=100,000 COLONIES/mL ESCHERICHIA COLI    Radiology Reports CT Abdomen Pelvis W Contrast  Result Date: 10/20/2020 CLINICAL DATA:  History of neuroendocrine tumor with possible infection EXAM: CT ABDOMEN AND PELVIS WITH CONTRAST TECHNIQUE: Multidetector CT imaging of the abdomen and pelvis was performed using the standard protocol following bolus administration of intravenous contrast. CONTRAST:  44mL OMNIPAQUE IOHEXOL 300 MG/ML  SOLN COMPARISON:  08/14/2020 FINDINGS: Lower chest: Lung bases demonstrate bilateral pleural effusions right greater than left increased when compared with the prior exam. The degree of right lower lobe consolidation has also increased from the prior exam. The visualized pulmonary artery demonstrates some suggestion of filling defects within the left lower lobe pulmonary arterial branches. These are incompletely evaluated on this exam. Hepatobiliary: Liver again demonstrates changes consistent with diffuse hepatic metastatic disease. The degree of  decreased attenuation within the liver has increased significantly in the interval from the prior exam consistent with progressive metastatic disease. Scattered calcifications are again identified when compared with the prior study. Previously described central area of calcification is again identified and measures approximately 17 mm. The surrounding hepatic parenchyma however demonstrates diffuse decreased attenuation consistent with further metastatic disease. Gallbladder demonstrates dependent density likely related to milk of calcium. No biliary ductal dilatation is seen. The portal vein is attenuated but patent. This is similar to that noted on the prior exam. Pancreas: Unremarkable. No pancreatic ductal dilatation or surrounding inflammatory changes. Spleen: Normal in size without focal abnormality. Adrenals/Urinary Tract: Adrenal glands are within normal limits. The kidneys are well visualize within normal enhancement pattern bilaterally. Left renal cyst  is again noted and stable. Delayed images demonstrate normal enhancement. The bladder is decompressed by Foley catheter. Stomach/Bowel: The colon is predominately decompressed the appendix is air-filled and within normal limits. Small bowel demonstrates some reactive wall thickening distally related to the associated ascites which has increased in the interval from the prior exam. No obstructive changes are seen. Vascular/Lymphatic: Atherosclerotic calcifications are seen without aneurysmal dilatation. There is again noted a partially calcified central mesenteric mass which again measures 3.5 cm in greatest dimension stable in appearance from the prior exam. Reproductive: Prostate is again somewhat heterogeneous but within normal limits. Other: Significant ascites is noted when compared with the prior exam this has increased. Musculoskeletal: Degenerative changes of lumbar spine are noted. No acute bony abnormality is seen. IMPRESSION: Changes consistent  with diffuse hepatic metastatic disease which appears to have increased in the interval from the prior exam. Increase in bilateral pleural effusions right greater than left with associated lower lobe consolidation on the right. Findings suspicious for pulmonary emboli in the left lower lobe. CTA of the chest could be performed to evaluate although felt to be best performed after short period of delay due to the patient's mildly elevated creatinine and decreased GFR. Increase in the degree of ascites consistent with the progressive metastatic disease. Stable central mesenteric mass consistent with the given clinical history. Some increased bowel wall thickening is noted in the distal small bowel. This could be related to the underlying mesenteric mass although may simply be reactive to the increase in ascites. Electronically Signed   By: Inez Catalina M.D.   On: 09/29/2020 20:34   DG Chest Port 1 View  Result Date: 10/02/2020 CLINICAL DATA:  Found unresponsive, possible sepsis EXAM: PORTABLE CHEST 1 VIEW COMPARISON:  08/14/2020 FINDINGS: Cardiac shadow is within normal limits. Increased density is noted in the right hemithorax likely related to a posteriorly layering effusion. No definitive underlying infiltrate is seen. Left lung is clear. No bony abnormality is noted. IMPRESSION: Right-sided pleural effusion. No definitive infiltrate is seen although may be obscured in the lower lobe by the effusion. Electronically Signed   By: Inez Catalina M.D.   On: 10/07/2020 17:22   DG Chest Port 1V same Day  Result Date: 10/10/2020 CLINICAL DATA:  Altered mental status.  COVID positive EXAM: PORTABLE CHEST 1 VIEW COMPARISON:  09/25/2020 FINDINGS: Elevated right hemidiaphragm with right lower lobe airspace disease and right effusion, unchanged. Mild left lower lobe airspace disease has progressed. Pulmonary vascular congestion is present suggesting fluid overload. IMPRESSION: Pulmonary vascular congestion suggesting mild  fluid overload Right lower lobe airspace disease and right effusion unchanged. Progression of left lower lobe atelectasis/infiltrate. Electronically Signed   By: Franchot Gallo M.D.   On: 10/10/2020 10:58   ECHOCARDIOGRAM COMPLETE  Result Date: 10/11/2020    ECHOCARDIOGRAM REPORT   Patient Name:   TANOR GLASPY Date of Exam: 10/11/2020 Medical Rec #:  [id number]      Height:       76.0 in Accession #:    [id number]     Weight:       202.8 lb Date of Birth:  [birth year]      BSA:          2.228 m Patient Age:    56 years       BP:           92/57 mmHg Patient Gender: M              HR:  113 bpm. Exam Location:  Inpatient Procedure: 2D Echo, Cardiac Doppler and Color Doppler Indications:    Acute Respiratory Distress R06.03  History:        Patient has no prior history of Echocardiogram examinations.                 Arrythmias:RBBB; Risk Factors:Hypertension. COVID -19 Positive.  Sonographer:    Tiffany Dance Referring Phys: Perry  Sonographer Comments: Technically difficult study due to poor echo windows, no apical window, suboptimal parasternal window and suboptimal subcostal window. IMPRESSIONS  1. Left ventricular ejection fraction, by estimation, is not well visualized%. Left ventricular endocardial border not optimally defined to evaluate regional wall motion.  2. Right ventricular systolic function was not well visualized.  3. The mitral valve was not well visualized.  4. The aortic valve is grossly normal. Aortic valve regurgitation is not visualized. No aortic stenosis is present. Comparison(s): No prior Echocardiogram. Conclusion(s)/Recommendation(s): Nondiagnostic study. Poor images in multiple windows. Unable to assess structure or function. FINDINGS  Left Ventricle: Left ventricular ejection fraction, by estimation, is not well visualized%. Left ventricular endocardial border not optimally defined to evaluate regional wall motion. Right Ventricle: Right ventricular systolic  function was not well visualized. Left Atrium: Left atrial size was not well visualized. Right Atrium: Right atrial size was not well visualized. Pericardium: The pericardium was not assessed. Mitral Valve: The mitral valve was not well visualized. Tricuspid Valve: The tricuspid valve is not well visualized. Aortic Valve: The aortic valve is grossly normal. Aortic valve regurgitation is not visualized. No aortic stenosis is present. Pulmonic Valve: The pulmonic valve was not well visualized. Aorta: The aortic root was not well visualized, the ascending aorta was not well visualized and the aortic arch was not well visualized. Venous: The inferior vena cava was not well visualized. IAS/Shunts: The interatrial septum was not well visualized.  IVC IVC diam: 1.40 cm Buford Dresser MD Electronically signed by Buford Dresser MD Signature Date/Time: 10/11/2020/1:43:13 PM    Final    VAS [place] LOWER EXTREMITY VENOUS (DVT)  Result Date: 10/10/2020  Lower Venous DVT Study Indications: D-dimer, Covid-19, swelling, PE.  Risk Factors: Cancer Metastatic malignant neuroendocrine tumor to liver. Comparison Study: 07-03-2020 Prior bilateral lower extremity venous was negative                   for DVT. Performing Technologist: Darlin Coco RDMS  Examination Guidelines: A complete evaluation includes B-mode imaging, spectral Doppler, color Doppler, and power Doppler as needed of all accessible portions of each vessel. Bilateral testing is considered an integral part of a complete examination. Limited examinations for reoccurring indications may be performed as noted. The reflux portion of the exam is performed with the patient in reverse Trendelenburg.  +---------+---------------+---------+-----------+----------+--------------+  RIGHT     Compressibility Phasicity Spontaneity Properties Thrombus Aging  +---------+---------------+---------+-----------+----------+--------------+  CFV       Partial         Yes       Yes                     Acute           +---------+---------------+---------+-----------+----------+--------------+  SFJ       Partial         Yes       Yes                    Acute           +---------+---------------+---------+-----------+----------+--------------+  FV Prox   None                                             Acute           +---------+---------------+---------+-----------+----------+--------------+  FV Mid                    Yes       Yes                                    +---------+---------------+---------+-----------+----------+--------------+  FV Distal Partial         Yes       Yes                    Acute           +---------+---------------+---------+-----------+----------+--------------+  PFV       None                                             Acute           +---------+---------------+---------+-----------+----------+--------------+  POP       Partial         Yes       Yes                    Acute           +---------+---------------+---------+-----------+----------+--------------+  PTV                       No        No                                     +---------+---------------+---------+-----------+----------+--------------+  PERO                                                       Not visualized  +---------+---------------+---------+-----------+----------+--------------+   +---------+---------------+---------+-----------+----------+-------------------+  LEFT      Compressibility Phasicity Spontaneity Properties Thrombus Aging       +---------+---------------+---------+-----------+----------+-------------------+  CFV       None            No        No                     Acute                +---------+---------------+---------+-----------+----------+-------------------+  SFJ       None                                             Acute                +---------+---------------+---------+-----------+----------+-------------------+  FV Prox  Yes       Yes                                          +---------+---------------+---------+-----------+----------+-------------------+  FV Mid                    Yes       Yes                                         +---------+---------------+---------+-----------+----------+-------------------+  FV Distal                 No        No                     Acute                +---------+---------------+---------+-----------+----------+-------------------+  PFV                       Yes       Yes                                         +---------+---------------+---------+-----------+----------+-------------------+  POP                                                        Unable to visualize                                                              due to patient                                                                   position             +---------+---------------+---------+-----------+----------+-------------------+  PTV                       No        No                     Acute                +---------+---------------+---------+-----------+----------+-------------------+  PERO                                                       Not visualized       +---------+---------------+---------+-----------+----------+-------------------+  Summary: RIGHT: - Findings consistent with acute deep vein thrombosis involving the right common femoral vein, SF junction, right femoral vein, right popliteal vein, right proximal profunda vein, and right posterior tibial veins. - No cystic structure found in the popliteal fossa.  LEFT: - Findings consistent with acute deep vein thrombosis involving the left common femoral vein, SF junction, left femoral vein, and left posterior tibial veins.  *See table(s) above for measurements and observations. Electronically signed by Monica Martinez MD on 10/10/2020 at 5:59:55 PM.    Final

## 2020-10-12 NOTE — Progress Notes (Signed)
New bag of heparin was hung at 6.30AM at 51mL/hr.

## 2020-10-12 NOTE — Progress Notes (Signed)
ANTICOAGULATION CONSULT NOTE - Follow Up Consult  Pharmacy Consult for heparin Indication: pulmonary embolus  Labs: Recent Labs     0000 10/18/2020 1644 10/05/2020 1755 10/10/20 0213 10/10/20 0230 10/10/20 0418 10/10/20 2218 10/11/20 0827 10/11/20 1745 10/12/20 0438  HGB  --   --  9.3*   < > 9.8*  --   --  8.2*  --  8.2*  HCT  --   --  27.5*   < > 29.5*  --   --  22.8*  --  22.7*  PLT   < >  --  124*  --  122*  --   --  91*  --  98*  APTT  --  31  --   --   --   --   --   --   --   --   LABPROT  --  17.8*  --   --  19.7*  --   --   --   --   --   INR  --  1.5*  --   --  1.7*  --   --   --   --   --   HEPARINUNFRC  --   --   --   --   --   --    < > 0.17* 0.23* 0.21*  CREATININE  --   --  1.63*  --   --  1.55*  --  2.37*  --   --    < > = values in this interval not displayed.    Assessment: 80yo male subtherapeutic on heparin after rate change; no gtt issues or signs of bleeding per RN.  Goal of Therapy:  Heparin level 0.3-0.7 units/ml   Plan:  Will increase heparin gtt by 3 units/kg/hr to 2300 units/hr and check level this evening.    Wynona Neat, PharmD, BCPS  10/12/2020,6:14 AM

## 2020-10-12 NOTE — Progress Notes (Signed)
Daily Progress Note   Patient Name: Charles Schmidt       Date: 10/12/2020 DOB: [birth year]  Age: 80 y.o. MRN#: [id number] Attending Physician: Jonetta Osgood, MD Primary Care Physician: Nolene Ebbs, MD Admit Date: 10/05/2020  Reason for Follow-up: end of life care, symptom management.   Subjective: 14:25--Patient appears mildly uncomfortable. He is not able to follow commands or verbalize any responses. He is moaning at times.   16:25--Spoke with [name] by phone and made him aware of my assessment at bedside. Discussed the need for more proactive pain control consistent with goal of comfort care. Charles Schmidt agrees - he does not want his father to be uncomfortable. Discussed that I would start scheduled liquid medication that would be safe to administer even if he is unable to swallow. Also discussed that this could be increased/adjusted as needed. Provided education and counseling on expectations at EOL, including increased pain and/or dyspnea as death becomes more imminent. Discussed option to start a continuous infusion if needed.   Charles Schmidt expresses appreciation for the update and is agreeable to start scheduled pain medication for now.   Length of Stay: 3  Current Medications: Scheduled Meds:  . antiseptic oral rinse  15 mL Topical BID  . octreotide  150 mcg Subcutaneous BID    Continuous Infusions: . ceFEPime (MAXIPIME) IV Stopped (10/12/20 0523)    PRN Meds: acetaminophen **OR** acetaminophen, glycopyrrolate **OR** glycopyrrolate **OR** glycopyrrolate, haloperidol **OR** haloperidol **OR** haloperidol lactate, LORazepam **OR** LORazepam **OR** LORazepam, morphine injection, ondansetron **OR** ondansetron (ZOFRAN) IV, oxyCODONE, polyvinyl alcohol, sodium chloride flush             Vital Signs: BP 107/76 (BP Location: Left Wrist)   Pulse 96   Temp (!) 97.3 F (36.3 C)   Resp 16   Ht 6\' 4"  (1.93 m)   Wt 92 kg   SpO2 100%   BMI 24.69 kg/m  SpO2: SpO2: 100 % O2 Device: O2 Device: Nasal Cannula O2 Flow Rate: O2 Flow Rate (L/min): 4 L/min  Intake/output summary:   Intake/Output Summary (Last 24 hours) at 10/12/2020 1609 Last data filed at 10/12/2020 1424 Gross per 24 hour  Intake 675.72 ml  Output 230 ml  Net 445.72 ml   LBM: Last BM Date: 10/11/20 Baseline Weight: Weight: 92 kg Most recent weight: Weight: 92 kg  Palliative Assessment/Data: PPS 10%      Palliative Care Assessment & Plan    Patient Profile: 80 y.o.malewith past medical history of 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 Per EMS arrival patient was alert but altered. Patient was recently diagnosed with COVID19 at rehab facility. Patient wasadmitted on 3/[id number]/[id number] complicated UTI, acute metabolic encephalopathy, AKI, COVID19 infection.  Of note, PMT worked with patient during his last hospitalization in November 2021.  Assessment: Acute metabolic encephalopathy Complicated UTI AKI on CKD Acute hypoxic respiratory failure Pulmonary embolism Bilateral lower extremity DVT COVID19 pneumonia Pleural effusion Hypotension History of metastatic carcinoid Failure to thrive Frailty Deconditioning Terminal care  Recommendations/Plan:  Continue full comfort care  Continue DNR/DNI as previously documented  Heparin infusion stopped  Start scheduled oxycodone concentrate solution 5 mg every 6 hours  PMT will continue to follow   Goals of Care and Additional Recommendations:  Limitations on Scope of Treatment: Full Comfort Care  Code Status: DNR/DNI  Prognosis:   Hours - Days  Discharge  Planning:  Anticipated hospital death  Care plan was discussed with Dr. Sloan Leiter, nursing  Thank you for allowing the Palliative Medicine Team to assist in the care of this patient.   Total Time 25 minutes Prolonged Time Billed  no       Greater than 50%  of this time was spent counseling and coordinating care related to the above assessment and plan.  Lavena Bullion, NP  Please contact Palliative Medicine Team phone at [phone] for questions and concerns.

## 2020-10-13 DIAGNOSIS — N179 Acute kidney failure, unspecified: Secondary | ICD-10-CM | POA: Diagnosis not present

## 2020-10-13 DIAGNOSIS — C799 Secondary malignant neoplasm of unspecified site: Secondary | ICD-10-CM | POA: Diagnosis not present

## 2020-10-13 DIAGNOSIS — G9341 Metabolic encephalopathy: Secondary | ICD-10-CM | POA: Diagnosis not present

## 2020-10-13 DIAGNOSIS — N390 Urinary tract infection, site not specified: Secondary | ICD-10-CM | POA: Diagnosis not present

## 2020-10-13 LAB — URINE CULTURE: Culture: >=100000 — AB

## 2020-10-13 NOTE — Progress Notes (Signed)
PROGRESS NOTE                                                                                                                                                                                                             Patient Demographics:    Charles Schmidt, is a 80 y.o. male, DOB - [birth year], MEQ:[id number]  Outpatient Primary MD for the patient is Nolene Ebbs, MD   Admit date - 09/29/2020   LOS - 4  Chief Complaint  Patient presents with  . Altered Mental Status       Brief Narrative: Patient is a 80 y.o. male with PMHx of metastatic carcinoid tumor, diarrhea related to carcinoid, BPH-with indwelling chronic Foley catheter, recurrent UTI-resident of SNF-who apparently was COVID-positive approximately 2 weeks back (was in isolation at SNF)-presenting with altered mental status-found to have acute metabolic encephalopathy in the context of COVID 19 infection/bacterial pneumonia and complicated UTI.  COVID-19 vaccinated status:   Significant Events: 1/18>> Admit to [facility] for metabolic encephalopathy-complicated UTI, [id number] infection.  Hypotension requiring multiple fluid boluses. 1/19>> persistent hypotension/worsening hypoxia-started on midodrine-IV fluid boluses-on 3-4 L of O2.  Significant studies: 1/18>> CT abdomen/pelvis with contrast: Diffuse hepatic metastatic disease-increased compared to prior exam.  Increased bilateral pleural effusions-with RLL>> LLL consolidation.  Suspicious pulmonary emboli in left lower lobe.  Stable central mesenteric mass consistent with history of carcinoid.  Increase in degree of ascites. 1/18>> chest x-ray: Right-sided pleural effusion, no definite infiltrate seen. 1/19>> bilateral lower extremity Doppler:+ve DVT   COVID-19 medications: Remdesivir: 1/18 x 1  Antibiotics: Cefepime: 1/18>>  Microbiology data: 1/18 >>blood culture: No growth 1/18>> urine culture: E. coli,  Pseudomonas.-sensitivities pending. 08/14/20>> urine culture: Pansensitive Pseudomonas  Procedures: None  Consults: Palliative care, oncology  DVT prophylaxis: IV heparin infusion.    Subjective:   Unresponsive-appears comfortable.   Assessment  & Plan :   Acute metabolic encephalopathy: Multifactorial etiology-secondary to complicated UTI, hypotension/hypoxia.  No clinical improvement-has been transitioned to full comfort measures.  Complicated UTI: Has chronic indwelling Foley catheter likely contributing to recurrent UTIs.  Has prior history of Pseudomonas UTI-urine cultures growing E. coli and Pseudomonas-although has been transitioned to comfort measures-family wishes to continue cefepime for a few more days  AKI on CKD stage IIIa: AKI likely hemodynamically mediated due to COVID-19 infection/poor oral intake, UTI and probably diarrhea related  to carcinoid.  Unfortunately-with supportive care-renal function continues to worsen.  Not a candidate for aggressive care including RRT-after discussion by this MD and by palliative care team with family-he has been transitioned to comfort measures.  Acute hypoxic respiratory failure due to pulmonary embolism, HCAP/possible COVID-19 pneumonia and pleural effusion: Supportive care continues-on comfort care measures.  PE/bilateral lower extremity DVT: Provoked by COVID-19-poor functional status-underlying malignancy.  He has liver metastases-and INR is already at 1.7.  He appears incredibly frail-debilitated-underlying prognosis appears very poor-apart from IV heparin-he was felt not to be a candidate for any further escalation in care including lytics/catheter guided therapy.  I spoke to the patient's son today-explained that IV heparin was not really helping the patient at this stage-with frequent CBC/heparin levels are probably causing him pain.  Patient's son [name]-agreed to stop anticoagulation.  Hypotension: Suspect this is  multifactorial-related to hypovolemia in the setting of diarrhea due to carcinoid-probably bedbound status/chronic debility/frailty also contributing.  Random cortisol level appropriate.  Initially on midodrine-but that has been discontinued-on full comfort measures.  COVID-19 infection: No significant groundglass opacities/COVID-19 related changes seen on imaging studies.  Since this has been ongoing for 2 weeks-not sure if Remdesivir has any role-further more-due to significantly elevated LFTs-Remdesivir discontinued.  Continue full comfort measures  History of metastatic carcinoid: Discussed with patient's oncologist-has had significant issues with debility/multiple hospitalizations-but had recently improved somewhat-now back in the hospital with above noted issues.  Awaiting input from patient's primary oncologist-but I did discuss with him yesterday-agrees with no CODE BLUE and gentle medical treatment.  Continue octreotide injection for now as per family wishes-although he is under full comfort measures.  Chronic urinary retention-chronic indwelling Foley catheter-BPH: Foley catheter in place-continue for comfort.  No longer on Flomax/finasteride as has been transitioned to full comfort measures.   Poor oral intake/failure to thrive syndrome: Very frail/debilitated baseline-now with further worsening due to acute infection and other issues as outlined above.  Per nursing staff-no significant oral intake for past 3 days.  Appears to be actively dying-see below.  Palliative care: Incredibly frail-debilitated-SNF resident-with metastatic carcinoid-now with COVID infection resulting in PE/DVT-hypotensive-hypoxic-given his frailty (albumin of 1.6!!)-tenuous clinical status-after extensive discussion with patient's son [name] over the phone on 1/19-given poor prognosis-he was made a DNR.  With ongoing discussion with family on 1/20 by myself and by the palliative care team-patient was transitioned to full  comfort measures.  He appears to be very lethargic and actively dying-no oral intake for the past 3 days-palliative care following-suspect inpatient death in a few days.        Component Value Date/Time   BNP 82.4 08/14/2020 1020    GI prophylaxis: PPI  ABG:    Component Value Date/Time   HCO3 21.3 10/10/2020 0213   TCO2 23 10/10/2020 0213   ACIDBASEDEF 4.0 (H) 10/10/2020 0213   O2SAT 99.0 10/10/2020 0213    Vent Settings: N/A   Condition - Extremely Guarded  Family Communication  : Son-[name]-[phone]-updated over the phone 1/20  Code Status :  DNR  Diet :  Diet Order            DIET - DYS 1 Room service appropriate? No; Fluid consistency: Nectar Thick  Diet effective now                  Disposition Plan  :   Status is: Inpatient  Remains inpatient appropriate because:Inpatient level of care appropriate due to severity of illness   Dispo: The patient is from:  SNF              Anticipated d/c is to: TBD              Anticipated d/c date is: > 3 days              Patient currently is not medically stable to d/c.    Barriers to discharge: Comfort care-actively dying-awaiting further recommendations from palliative care regarding disposition but suspect inpatient death.  Antimicorbials  :    Anti-infectives (From admission, onward)   Start     Dose/Rate Route Frequency Ordered Stop   10/11/20 1000  remdesivir 100 mg in sodium chloride 0.9 % 100 mL IVPB  Status:  Discontinued       "Followed by" Linked Group Details   100 mg 200 mL/hr over 30 Minutes Intravenous Daily 10/10/20 0123 10/10/20 1121   10/10/20 0500  ceFEPIme (MAXIPIME) 2 g in sodium chloride 0.9 % 100 mL IVPB        2 g 200 mL/hr over 30 Minutes Intravenous Every 12 hours 10/17/2020 1947     10/10/20 0200  remdesivir 200 mg in sodium chloride 0.9% 250 mL IVPB       "Followed by" Linked Group Details   200 mg 580 mL/hr over 30 Minutes Intravenous Once 10/10/20 0123 10/10/20 0323   10/21/2020  1700  ceFEPIme (MAXIPIME) 2 g in sodium chloride 0.9 % 100 mL IVPB        2 g 200 mL/hr over 30 Minutes Intravenous  Once 09/23/2020 1651 09/24/2020 1800      Inpatient Medications  Scheduled Meds: . antiseptic oral rinse  15 mL Topical BID  . octreotide  150 mcg Subcutaneous BID  . oxyCODONE  5 mg Oral Q6H   Continuous Infusions: . ceFEPime (MAXIPIME) IV Stopped (10/13/20 0503)   PRN Meds:.   Time Spent in minutes  15  See all Orders from today for further details   Oren Binet M.D on 10/13/2020 at 2:09 PM  To page go to [email or web address] - use universal password  [facility] -  Office  [phone]    Objective:   Vitals:   10/11/20 1133 10/11/20 1336 10/12/20 0626 10/12/20 1223  BP: (!) 93/59  126/86 107/76  Pulse: (!) 112 (!) 111 (!) 108 96  Resp: (!) 23  20 16   Temp: 98.2 F (36.8 C)  97.9 F (36.6 C) (!) 97.3 F (36.3 C)  TempSrc: Oral  Axillary   SpO2: 93% 94%  100%  Weight:      Height:        Wt Readings from Last 3 Encounters:  10/10/20 92 kg  08/31/20 92 kg  08/21/20 97.2 kg     Intake/Output Summary (Last 24 hours) at 10/13/2020 1409 Last data filed at 10/13/2020 1241 Gross per 24 hour  Intake 873.74 ml  Output 550 ml  Net 323.74 ml     Physical Exam Appears comfortable.   Data Review:    CBC Recent Labs  Lab 09/27/2020 1755 10/10/20 0213 10/10/20 0230 10/11/20 0827 10/12/20 0438  WBC 4.9  --  7.0 4.3 5.7  HGB 9.3* 11.6* 9.8* 8.2* 8.2*  HCT 27.5* 34.0* 29.5* 22.8* 22.7*  PLT 124*  --  122* 91* 98*  MCV 77.2*  --  75.4* 72.6* 71.6*  MCH 26.1  --  25.1* 26.1 25.9*  MCHC 33.8  --  33.2 36.0 36.1*  RDW 21.6*  --  22.6* 21.0* 20.3*  LYMPHSABS 1.1  --  0.5*  --   --   MONOABS 0.2  --  0.4  --   --   EOSABS 0.0  --  0.0  --   --   BASOSABS 0.0  --  0.0  --   --     Chemistries  Recent Labs  Lab 10/22/2020 1755 10/10/20 0213 10/10/20 0418 10/11/20 0827  NA 141 136 140 144  K 4.2 >8.5* 4.4 5.2*  CL 107  --  107 111   CO2 18*  --  18* 17*  GLUCOSE 164*  --  105* 88  BUN 18  --  18 26*  CREATININE 1.63*  --  1.55* 2.37*  CALCIUM 8.0*  --  8.0* 7.6*  MG  --   --  1.8  --   AST 65*  --  537* 710*  ALT 23  --  54* 112*  ALKPHOS 198*  --  175* 154*  BILITOT 1.1  --  1.6* 1.6*   ------------------------------------------------------------------------------------------------------------------ No results for input(s): CHOL, HDL, LDLCALC, TRIG, CHOLHDL, LDLDIRECT in the last 72 hours.  No results found for: HGBA1C ------------------------------------------------------------------------------------------------------------------ No results for input(s): TSH, T4TOTAL, T3FREE, THYROIDAB in the last 72 hours.  Invalid input(s): FREET3 ------------------------------------------------------------------------------------------------------------------ No results for input(s): VITAMINB12, FOLATE, FERRITIN, TIBC, IRON, RETICCTPCT in the last 72 hours.  Coagulation profile Recent Labs  Lab 09/26/2020 1644 10/10/20 0230  INR 1.5* 1.7*    No results for input(s): DDIMER in the last 72 hours.  Cardiac Enzymes No results for input(s): CKMB, TROPONINI, MYOGLOBIN in the last 168 hours.  Invalid input(s): CK ------------------------------------------------------------------------------------------------------------------    Component Value Date/Time   BNP 82.4 08/14/2020 1020    Micro Results Recent Results (from the past 240 hour(s))  Blood Culture (routine x 2)     Status: None (Preliminary result)   Collection Time: 09/30/2020  5:20 PM   Specimen: BLOOD RIGHT ARM  Result Value Ref Range Status   Specimen Description BLOOD RIGHT ARM  Final   Special Requests   Final    BOTTLES DRAWN AEROBIC AND ANAEROBIC Blood Culture adequate volume   Culture   Final    NO GROWTH 3 DAYS Performed at [facility], [place] [address] [place], [place] [address]    Report Status PENDING  Incomplete  Blood Culture  (routine x 2)     Status: None (Preliminary result)   Collection Time: 09/25/2020  5:30 PM   Specimen: BLOOD RIGHT ARM  Result Value Ref Range Status   Specimen Description BLOOD RIGHT ARM  Final   Special Requests   Final    BOTTLES DRAWN AEROBIC AND ANAEROBIC Blood Culture results may not be optimal due to an inadequate volume of blood received in culture bottles   Culture   Final    NO GROWTH 3 DAYS Performed at [facility], [place] [address] [place], [place] [address]    Report Status PENDING  Incomplete  SARS CORONAVIRUS 2 (TAT 6-24 HRS) Nasopharyngeal Nasopharyngeal Swab     Status: Abnormal   Collection Time: 09/22/2020  7:40 PM   Specimen: Nasopharyngeal Swab  Result Value Ref Range Status   SARS Coronavirus 2 POSITIVE (A) NEGATIVE Final    Comment: (NOTE) SARS-CoV-2 target nucleic acids are DETECTED.  The SARS-CoV-2 RNA is generally detectable in upper and lower respiratory specimens during the acute phase of infection. Positive results are indicative of the presence of SARS-CoV-2 RNA. Clinical correlation with patient history and other diagnostic information is  necessary to determine patient infection status. Positive results do not rule out bacterial infection or co-infection with other viruses.  The expected result is Negative.  Fact Sheet for Patients: SugarRoll.be  Fact Sheet for Healthcare Providers: https://www.woods-mathews.com/  This test is not yet approved or cleared by the [place] FDA and  has been authorized for detection and/or diagnosis of SARS-CoV-2 by FDA under an Emergency Use Authorization (EUA). This EUA will remain  in effect (meaning this test can be used) for the duration of the COVID-19 declaration under Section 564(b)(1) of the Act, 21 U. S.C. section 360bbb-3(b)(1), unless the authorization is terminated or revoked sooner.   Performed at [facility], [place] [address] [place],  [place] [address]   Urine culture     Status: Abnormal (Preliminary result)   Collection Time: 10/09/2020  8:29 PM   Specimen: In/Out Cath Urine  Result Value Ref Range Status   Specimen Description IN/OUT CATH URINE  Final   Special Requests NONE  Final   Culture (A)  Final    >=100,000 COLONIES/mL ESCHERICHIA COLI >=100,000 COLONIES/mL PSEUDOMONAS AERUGINOSA SUSCEPTIBILITIES TO FOLLOW Performed at [facility], [place] [address], [place], [place] [address]    Report Status PENDING  Incomplete   Organism ID, Bacteria ESCHERICHIA COLI (A)  Final      Susceptibility   Escherichia coli - MIC*    AMPICILLIN <=2 SENSITIVE Sensitive     CEFAZOLIN <=4 SENSITIVE Sensitive     CEFEPIME <=0.12 SENSITIVE Sensitive     CEFTRIAXONE <=0.25 SENSITIVE Sensitive     CIPROFLOXACIN <=0.25 SENSITIVE Sensitive     GENTAMICIN <=1 SENSITIVE Sensitive     IMIPENEM <=0.25 SENSITIVE Sensitive     NITROFURANTOIN <=16 SENSITIVE Sensitive     TRIMETH/SULFA <=20 SENSITIVE Sensitive     AMPICILLIN/SULBACTAM <=2 SENSITIVE Sensitive     PIP/TAZO <=4 SENSITIVE Sensitive     * >=100,000 COLONIES/mL ESCHERICHIA COLI    Radiology Reports CT Abdomen Pelvis W Contrast  Result Date: 09/25/2020 CLINICAL DATA:  History of neuroendocrine tumor with possible infection EXAM: CT ABDOMEN AND PELVIS WITH CONTRAST TECHNIQUE: Multidetector CT imaging of the abdomen and pelvis was performed using the standard protocol following bolus administration of intravenous contrast. CONTRAST:  34mL OMNIPAQUE IOHEXOL 300 MG/ML  SOLN COMPARISON:  08/14/2020 FINDINGS: Lower chest: Lung bases demonstrate bilateral pleural effusions right greater than left increased when compared with the prior exam. The degree of right lower lobe consolidation has also increased from the prior exam. The visualized pulmonary artery demonstrates some suggestion of filling defects within the left lower lobe pulmonary arterial branches. These are incompletely evaluated on  this exam. Hepatobiliary: Liver again demonstrates changes consistent with diffuse hepatic metastatic disease. The degree of decreased attenuation within the liver has increased significantly in the interval from the prior exam consistent with progressive metastatic disease. Scattered calcifications are again identified when compared with the prior study. Previously described central area of calcification is again identified and measures approximately 17 mm. The surrounding hepatic parenchyma however demonstrates diffuse decreased attenuation consistent with further metastatic disease. Gallbladder demonstrates dependent density likely related to milk of calcium. No biliary ductal dilatation is seen. The portal vein is attenuated but patent. This is similar to that noted on the prior exam. Pancreas: Unremarkable. No pancreatic ductal dilatation or surrounding inflammatory changes. Spleen: Normal in size without focal abnormality. Adrenals/Urinary Tract: Adrenal glands are within normal limits. The kidneys are well visualize within normal enhancement pattern bilaterally. Left renal  cyst is again noted and stable. Delayed images demonstrate normal enhancement. The bladder is decompressed by Foley catheter. Stomach/Bowel: The colon is predominately decompressed the appendix is air-filled and within normal limits. Small bowel demonstrates some reactive wall thickening distally related to the associated ascites which has increased in the interval from the prior exam. No obstructive changes are seen. Vascular/Lymphatic: Atherosclerotic calcifications are seen without aneurysmal dilatation. There is again noted a partially calcified central mesenteric mass which again measures 3.5 cm in greatest dimension stable in appearance from the prior exam. Reproductive: Prostate is again somewhat heterogeneous but within normal limits. Other: Significant ascites is noted when compared with the prior exam this has increased.  Musculoskeletal: Degenerative changes of lumbar spine are noted. No acute bony abnormality is seen. IMPRESSION: Changes consistent with diffuse hepatic metastatic disease which appears to have increased in the interval from the prior exam. Increase in bilateral pleural effusions right greater than left with associated lower lobe consolidation on the right. Findings suspicious for pulmonary emboli in the left lower lobe. CTA of the chest could be performed to evaluate although felt to be best performed after short period of delay due to the patient's mildly elevated creatinine and decreased GFR. Increase in the degree of ascites consistent with the progressive metastatic disease. Stable central mesenteric mass consistent with the given clinical history. Some increased bowel wall thickening is noted in the distal small bowel. This could be related to the underlying mesenteric mass although may simply be reactive to the increase in ascites. Electronically Signed   By: Inez Catalina M.D.   On: 09/27/2020 20:34   DG Chest Port 1 View  Result Date: 10/04/2020 CLINICAL DATA:  Found unresponsive, possible sepsis EXAM: PORTABLE CHEST 1 VIEW COMPARISON:  08/14/2020 FINDINGS: Cardiac shadow is within normal limits. Increased density is noted in the right hemithorax likely related to a posteriorly layering effusion. No definitive underlying infiltrate is seen. Left lung is clear. No bony abnormality is noted. IMPRESSION: Right-sided pleural effusion. No definitive infiltrate is seen although may be obscured in the lower lobe by the effusion. Electronically Signed   By: Inez Catalina M.D.   On: 09/26/2020 17:22   DG Chest Port 1V same Day  Result Date: 10/10/2020 CLINICAL DATA:  Altered mental status.  COVID positive EXAM: PORTABLE CHEST 1 VIEW COMPARISON:  10/17/2020 FINDINGS: Elevated right hemidiaphragm with right lower lobe airspace disease and right effusion, unchanged. Mild left lower lobe airspace disease has  progressed. Pulmonary vascular congestion is present suggesting fluid overload. IMPRESSION: Pulmonary vascular congestion suggesting mild fluid overload Right lower lobe airspace disease and right effusion unchanged. Progression of left lower lobe atelectasis/infiltrate. Electronically Signed   By: Franchot Gallo M.D.   On: 10/10/2020 10:58   ECHOCARDIOGRAM COMPLETE  Result Date: 10/11/2020    ECHOCARDIOGRAM REPORT   Patient Name:   ABDULKARIM EBERLIN Date of Exam: 10/11/2020 Medical Rec #:  [id number]      Height:       76.0 in Accession #:    [id number]     Weight:       202.8 lb Date of Birth:  12/19/1940      BSA:          2.228 m Patient Age:    80 years       BP:           92/57 mmHg Patient Gender: M              HR:  113 bpm. Exam Location:  Inpatient Procedure: 2D Echo, Cardiac Doppler and Color Doppler Indications:    Acute Respiratory Distress R06.03  History:        Patient has no prior history of Echocardiogram examinations.                 Arrythmias:RBBB; Risk Factors:Hypertension. COVID -19 Positive.  Sonographer:    Tiffany Dance Referring Phys: [place]  Sonographer Comments: Technically difficult study due to poor echo windows, no apical window, suboptimal parasternal window and suboptimal subcostal window. IMPRESSIONS  1. Left ventricular ejection fraction, by estimation, is not well visualized%. Left ventricular endocardial border not optimally defined to evaluate regional wall motion.  2. Right ventricular systolic function was not well visualized.  3. The mitral valve was not well visualized.  4. The aortic valve is grossly normal. Aortic valve regurgitation is not visualized. No aortic stenosis is present. Comparison(s): No prior Echocardiogram. Conclusion(s)/Recommendation(s): Nondiagnostic study. Poor images in multiple windows. Unable to assess structure or function. FINDINGS  Left Ventricle: Left ventricular ejection fraction, by estimation, is not well visualized%.  Left ventricular endocardial border not optimally defined to evaluate regional wall motion. Right Ventricle: Right ventricular systolic function was not well visualized. Left Atrium: Left atrial size was not well visualized. Right Atrium: Right atrial size was not well visualized. Pericardium: The pericardium was not assessed. Mitral Valve: The mitral valve was not well visualized. Tricuspid Valve: The tricuspid valve is not well visualized. Aortic Valve: The aortic valve is grossly normal. Aortic valve regurgitation is not visualized. No aortic stenosis is present. Pulmonic Valve: The pulmonic valve was not well visualized. Aorta: The aortic root was not well visualized, the ascending aorta was not well visualized and the aortic arch was not well visualized. Venous: The inferior vena cava was not well visualized. IAS/Shunts: The interatrial septum was not well visualized.  IVC IVC diam: 1.40 cm Buford Dresser MD Electronically signed by Buford Dresser MD Signature Date/Time: 10/11/2020/1:43:13 PM    Final    VAS [place] LOWER EXTREMITY VENOUS (DVT)  Result Date: 10/10/2020  Lower Venous DVT Study Indications: D-dimer, Covid-19, swelling, PE.  Risk Factors: Cancer Metastatic malignant neuroendocrine tumor to liver. Comparison Study: 07-03-2020 Prior bilateral lower extremity venous was negative                   for DVT. Performing Technologist: Darlin Coco RDMS  Examination Guidelines: A complete evaluation includes B-mode imaging, spectral Doppler, color Doppler, and power Doppler as needed of all accessible portions of each vessel. Bilateral testing is considered an integral part of a complete examination. Limited examinations for reoccurring indications may be performed as noted. The reflux portion of the exam is performed with the patient in reverse Trendelenburg.  +---------+---------------+---------+-----------+----------+--------------+ RIGHT     CompressibilityPhasicitySpontaneityPropertiesThrombus Aging +---------+---------------+---------+-----------+----------+--------------+ CFV      Partial        Yes      Yes                  Acute          +---------+---------------+---------+-----------+----------+--------------+ SFJ      Partial        Yes      Yes                  Acute          +---------+---------------+---------+-----------+----------+--------------+ FV Prox  None  Acute          +---------+---------------+---------+-----------+----------+--------------+ FV Mid                  Yes      Yes                                 +---------+---------------+---------+-----------+----------+--------------+ FV DistalPartial        Yes      Yes                  Acute          +---------+---------------+---------+-----------+----------+--------------+ PFV      None                                         Acute          +---------+---------------+---------+-----------+----------+--------------+ POP      Partial        Yes      Yes                  Acute          +---------+---------------+---------+-----------+----------+--------------+ PTV                     No       No                                  +---------+---------------+---------+-----------+----------+--------------+ PERO                                                  Not visualized +---------+---------------+---------+-----------+----------+--------------+   +---------+---------------+---------+-----------+----------+-------------------+ LEFT     CompressibilityPhasicitySpontaneityPropertiesThrombus Aging      +---------+---------------+---------+-----------+----------+-------------------+ CFV      None           No       No                   Acute               +---------+---------------+---------+-----------+----------+-------------------+ SFJ      None                                          Acute               +---------+---------------+---------+-----------+----------+-------------------+ FV Prox                 Yes      Yes                                      +---------+---------------+---------+-----------+----------+-------------------+ FV Mid                  Yes      Yes                                      +---------+---------------+---------+-----------+----------+-------------------+  FV Distal               No       No                   Acute               +---------+---------------+---------+-----------+----------+-------------------+ PFV                     Yes      Yes                                      +---------+---------------+---------+-----------+----------+-------------------+ POP                                                   Unable to visualize                                                       due to patient                                                            position            +---------+---------------+---------+-----------+----------+-------------------+ PTV                     No       No                   Acute               +---------+---------------+---------+-----------+----------+-------------------+ PERO                                                  Not visualized      +---------+---------------+---------+-----------+----------+-------------------+    Summary: RIGHT: - Findings consistent with acute deep vein thrombosis involving the right common femoral vein, SF junction, right femoral vein, right popliteal vein, right proximal profunda vein, and right posterior tibial veins. - No cystic structure found in the popliteal fossa.  LEFT: - Findings consistent with acute deep vein thrombosis involving the left common femoral vein, SF junction, left femoral vein, and left posterior tibial veins.  *See table(s) above for measurements and observations. Electronically  signed by Monica Martinez MD on 10/10/2020 at 5:59:55 PM.    Final

## 2020-10-13 NOTE — Progress Notes (Signed)
Daily Progress Note   Patient Name: Charles Schmidt       Date: 10/13/2020 DOB: 04-Jan-1941  Age: 80 y.o. MRN#: [id number] Attending Physician: Jonetta Osgood, MD Primary Care Physician: Nolene Ebbs, MD Admit Date: 09/27/2020  Reason for Follow-up: end of life care, symptom management  Subjective: Patient appears comfortable. Respirations even and unlabored. [name] is at bedside. Discussed that the scheduled pain medication seemed to help.   Discussed possible transfer to [facility] is open to this. However, with recent bed status and COVID positive status it is unlikely he would get a bed in the next few days.   Noted that patient remain on 4L oxygen. Discussed with Charles Schmidt that oxygen does not promote comfort at EOL and can prolong the dying process. Per Charles Schmidt, the patient had previously expressed wanting to continue oxygen and is adamant that it stay on. He is agreeable to decrease it to 2L.    Length of Stay: 4  Current Medications: Scheduled Meds:  . antiseptic oral rinse  15 mL Topical BID  . octreotide  150 mcg Subcutaneous BID  . oxyCODONE  5 mg Oral Q6H    Continuous Infusions: . ceFEPime (MAXIPIME) IV 2 g (10/13/20 1712)    PRN Meds: acetaminophen **OR** acetaminophen, glycopyrrolate **OR** glycopyrrolate **OR** glycopyrrolate, haloperidol **OR** haloperidol **OR** haloperidol lactate, LORazepam **OR** LORazepam **OR** LORazepam, morphine injection, ondansetron **OR** ondansetron (ZOFRAN) IV, polyvinyl alcohol, sodium chloride flush   Vital Signs: BP (!) 89/63   Pulse 74   Temp (!) 97.5 F (36.4 C) (Axillary)   Resp 16   Ht 6\' 4"  (1.93 m)   Wt 92 kg   SpO2 96%   BMI 24.69 kg/m  SpO2: SpO2: 96 % O2 Device: O2 Device: Nasal Cannula O2 Flow Rate: O2  Flow Rate (L/min): 4 L/min  Intake/output summary:   Intake/Output Summary (Last 24 hours) at 10/13/2020 1911 Last data filed at 10/13/2020 1241 Gross per 24 hour  Intake 198.02 ml  Output 550 ml  Net -351.98 ml   LBM: Last BM Date: 10/12/20 Baseline Weight: Weight: 92 kg Most recent weight: Weight: 92 kg       Palliative Assessment/Data: PPS 10%      Palliative Care Assessment & Plan   Patient Profile: 80 y.o.malewith past medical history of 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 Per EMS arrival patient was alert but altered. Patient was recently diagnosed with COVID19 at rehab facility. Patient wasadmitted on 0/26/3785YIFO complicated UTI, acute metabolic encephalopathy, AKI, COVID19 infection.  Of note, PMT worked with patient during his last hospitalization in November 2021.  Assessment: Acute metabolic encephalopathy Complicated UTI AKI on CKD Acute hypoxic respiratory failure Pulmonary embolism Bilateral lower extremity DVT COVID19 pneumonia Pleural effusion Hypotension History of metastatic carcinoid Failure to thrive Frailty Deconditioning Terminal care  Recommendations/Plan:  Continue full comfort care  Continue DNR/DNI as previously documented  Continue scheduled oxycodone concentrate solution 5 mg every 6 hours  Oxygen decreased to 2L  PMT will continue to follow    Goals of Care and Additional Recommendations:  Limitations on Scope of Treatment: Full Comfort Care  Code Status: DNR/DNI  Prognosis:   Hours - Days  Discharge Planning:  Anticipated Hospital Death versus residential hospice    Thank you for allowing the Palliative Medicine Team to assist in the care of this patient.   Total Time 15 minutes Prolonged Time Billed  no       Greater than 50%   of this time was spent counseling and coordinating care related to the above assessment and plan.  Lavena Bullion, NP  Please contact Palliative Medicine Team phone at [phone] for questions and concerns.

## 2020-10-13 NOTE — Plan of Care (Signed)

## 2020-10-13 NOTE — Progress Notes (Signed)
Nutrition Brief Note  Patient identified on the Malnutrition Screening Tool (MST) Report  RD working remotely.  Chart reviewed.  Pt has been transitioned to comfort care.  No nutrition interventions warranted at this time.   Please consult as needed.   Lajuan Lines, RD, LDN Clinical Nutrition After Hours/Weekend Pager # in [place]

## 2020-10-14 DIAGNOSIS — N390 Urinary tract infection, site not specified: Secondary | ICD-10-CM | POA: Diagnosis not present

## 2020-10-14 LAB — CULTURE, BLOOD (ROUTINE X 2)
Culture: NO GROWTH
Culture: NO GROWTH
Special Requests: ADEQUATE

## 2020-10-14 NOTE — Progress Notes (Signed)
PROGRESS NOTE                                                                                                                                                                                                             Patient Demographics:    Charles Schmidt, is a 80 y.o. male, DOB - Aug 09, 1941, WUJ:[id number]  Outpatient Primary MD for the patient is Nolene Ebbs, MD   Admit date - 10/18/2020   LOS - 5  Chief Complaint  Patient presents with  . Altered Mental Status       Brief Narrative: Patient is a 80 y.o. male with PMHx of metastatic carcinoid tumor, diarrhea related to carcinoid, BPH-with indwelling chronic Foley catheter, recurrent UTI-resident of SNF-who apparently was COVID-positive approximately 2 weeks back (was in isolation at SNF)-presenting with altered mental status-found to have acute metabolic encephalopathy in the context of COVID 19 infection/bacterial pneumonia and complicated UTI.  COVID-19 vaccinated status:   Significant Events: 1/18>> Admit to [facility] for metabolic encephalopathy-complicated UTI, NFAOZ-30 infection.  Hypotension requiring multiple fluid boluses. 1/19>> persistent hypotension/worsening hypoxia-started on midodrine-IV fluid boluses-on 3-4 L of O2.  Significant studies: 1/18>> CT abdomen/pelvis with contrast: Diffuse hepatic metastatic disease-increased compared to prior exam.  Increased bilateral pleural effusions-with RLL>> LLL consolidation.  Suspicious pulmonary emboli in left lower lobe.  Stable central mesenteric mass consistent with history of carcinoid.  Increase in degree of ascites. 1/18>> chest x-ray: Right-sided pleural effusion, no definite infiltrate seen. 1/19>> bilateral lower extremity Doppler:+ve DVT   COVID-19 medications: Remdesivir: 1/18 x 1  Antibiotics: Cefepime: 1/18>>1/22  Microbiology data: 1/18 >>blood culture: No growth 1/18>> urine culture: E. coli,  Pseudomonas.-sensitivities pending. 08/14/20>> urine culture: Pansensitive Pseudomonas  Procedures: None  Consults: Palliative care, oncology  DVT prophylaxis: IV heparin infusion.    Subjective:   Barely responsive-seems to be actively dying.   Assessment  & Plan :   Acute metabolic encephalopathy: Multifactorial etiology-secondary to complicated UTI, hypotension/hypoxia.  No clinical improvement-has been transitioned to full comfort measures.  Complicated UTI: Has chronic indwelling Foley catheter likely contributing to recurrent UTIs.  Has prior history of Pseudomonas UTI-urine cultures growing E. coli and Pseudomonas-although has been transitioned to comfort measures-family wishes to continue cefepime for a few more days-however patient has already completed 5 days of cefepime-with his current clinical situation-no indications to continue antimicrobial therapy  any further.  AKI on CKD stage IIIa: AKI likely hemodynamically mediated due to COVID-19 infection/poor oral intake, UTI and probably diarrhea related to carcinoid.  Unfortunately-with supportive care-renal function continues to worsen.  Not a candidate for aggressive care including RRT-after discussion by this MD and by palliative care team with family-he has been transitioned to comfort measures.  Acute hypoxic respiratory failure due to pulmonary embolism, HCAP/possible COVID-19 pneumonia and pleural effusion: Supportive care continues-on comfort care measures.  PE/bilateral lower extremity DVT: Provoked by COVID-19-poor functional status-underlying malignancy.  He has liver metastases-and INR is already at 1.7.  He appears incredibly frail-debilitated-underlying prognosis appears very poor-apart from IV heparin-he was felt not to be a candidate for any further escalation in care including lytics/catheter guided therapy.  I spoke to the patient's son today-explained that IV heparin was not really helping the patient at this  stage-with frequent CBC/heparin levels are probably causing him pain.  Patient's [name]-agreed to stop anticoagulation.  Hypotension: Suspect this is multifactorial-related to hypovolemia in the setting of diarrhea due to carcinoid-probably bedbound status/chronic debility/frailty also contributing.  Random cortisol level appropriate.  Initially on midodrine-but that has been discontinued-on full comfort measures.  COVID-19 infection: No significant groundglass opacities/COVID-19 related changes seen on imaging studies.  Since this has been ongoing for 2 weeks-not sure if Remdesivir has any role-further more-due to significantly elevated LFTs-Remdesivir discontinued.  Continue full comfort measures  History of metastatic carcinoid: Discussed with patient's oncologist-has had significant issues with debility/multiple hospitalizations-but had recently improved somewhat-now back in the hospital with above noted issues.  Awaiting input from patient's primary oncologist-but I did discuss with him yesterday-agrees with no CODE BLUE and gentle medical treatment.  Continue octreotide injection for now as per family wishes-although he is under full comfort measures.  Chronic urinary retention-chronic indwelling Foley catheter-BPH: Foley catheter in place-continue for comfort.  No longer on Flomax/finasteride as has been transitioned to full comfort measures.   Poor oral intake/failure to thrive syndrome: Very frail/debilitated baseline-now with further worsening due to acute infection and other issues as outlined above.  Per nursing staff-no significant oral intake for past 3 days.  Appears to be actively dying-see below.  Palliative care: Incredibly frail-debilitated-SNF resident-with metastatic carcinoid-now with COVID infection resulting in PE/DVT-hypotensive-hypoxic-given his frailty (albumin of 1.6!!)-tenuous clinical status-after extensive discussion with patient's [name] over the phone on 1/19-given  poor prognosis-he was made a DNR.  With ongoing discussion with family on 1/20 by myself and by the palliative care team-patient was transitioned to full comfort measures.  He appears to be very lethargic and actively dying-no oral intake for the past 3 days-palliative care following-suspect inpatient death in a few days.        Component Value Date/Time   BNP 82.4 08/14/2020 1020    GI prophylaxis: PPI  ABG:    Component Value Date/Time   HCO3 21.3 10/10/2020 0213   TCO2 23 10/10/2020 0213   ACIDBASEDEF 4.0 (H) 10/10/2020 0213   O2SAT 99.0 10/10/2020 0213    Vent Settings: N/A   Condition - Extremely Guarded  Family Communication  : Son-Charles Schmidt-[phone]-updated over the phone 1/20-palliative care speaking with patient's son on a daily basis.  Code Status :  DNR  Diet :  Diet Order            DIET - DYS 1 Room service appropriate? No; Fluid consistency: Nectar Thick  Diet effective now  Disposition Plan  :   Status is: Inpatient  Remains inpatient appropriate because:Inpatient level of care appropriate due to severity of illness   Dispo: The patient is from: SNF              Anticipated d/c is to: TBD              Anticipated d/c date is: > 3 days              Patient currently is not medically stable to d/c.    Barriers to discharge: Comfort care-actively dying-suspect inpatient death in a matter for few days.  Antimicorbials  :    Anti-infectives (From admission, onward)   Start     Dose/Rate Route Frequency Ordered Stop   10/11/20 1000  remdesivir 100 mg in sodium chloride 0.9 % 100 mL IVPB  Status:  Discontinued       "Followed by" Linked Group Details   100 mg 200 mL/hr over 30 Minutes Intravenous Daily 10/10/20 0123 10/10/20 1121   10/10/20 0500  ceFEPIme (MAXIPIME) 2 g in sodium chloride 0.9 % 100 mL IVPB  Status:  Discontinued        2 g 200 mL/hr over 30 Minutes Intravenous Every 12 hours 10/13/2020 1947 10/14/20 0714   10/10/20  0200  remdesivir 200 mg in sodium chloride 0.9% 250 mL IVPB       "Followed by" Linked Group Details   200 mg 580 mL/hr over 30 Minutes Intravenous Once 10/10/20 0123 10/10/20 0323   09/29/2020 1700  ceFEPIme (MAXIPIME) 2 g in sodium chloride 0.9 % 100 mL IVPB        2 g 200 mL/hr over 30 Minutes Intravenous  Once 10/17/2020 1651 10/10/2020 1800      Inpatient Medications  Scheduled Meds: . antiseptic oral rinse  15 mL Topical BID  . octreotide  150 mcg Subcutaneous BID  . oxyCODONE  5 mg Oral Q6H   Continuous Infusions:  PRN Meds:.   Time Spent in minutes  15  See all Orders from today for further details   Oren Binet M.D on 10/14/2020 at 10:34 AM  To page go to [email or web address] - use universal password  [facility] -  Office  [phone]    Objective:   Vitals:   10/12/20 1223 10/13/20 1105 10/13/20 2122 10/14/20 0444  BP: 107/76 (!) 89/63 135/77 136/74  Pulse: 96 74 100 100  Resp: 16 16 18 16   Temp: (!) 97.3 F (36.3 C) (!) 97.5 F (36.4 C) 97.8 F (36.6 C) 98 F (36.7 C)  TempSrc:  Axillary Oral Oral  SpO2: 100% 96% 96% 95%  Weight:      Height:        Wt Readings from Last 3 Encounters:  10/10/20 92 kg  08/31/20 92 kg  08/21/20 97.2 kg     Intake/Output Summary (Last 24 hours) at 10/14/2020 1034 Last data filed at 10/14/2020 0400 Gross per 24 hour  Intake 198.02 ml  Output 550 ml  Net -351.98 ml     Physical Exam Appears comfortable.   Data Review:    CBC Recent Labs  Lab 09/27/2020 1755 10/10/20 0213 10/10/20 0230 10/11/20 0827 10/12/20 0438  WBC 4.9  --  7.0 4.3 5.7  HGB 9.3* 11.6* 9.8* 8.2* 8.2*  HCT 27.5* 34.0* 29.5* 22.8* 22.7*  PLT 124*  --  122* 91* 98*  MCV 77.2*  --  75.4* 72.6* 71.6*  MCH 26.1  --  25.1* 26.1  25.9*  MCHC 33.8  --  33.2 36.0 36.1*  RDW 21.6*  --  22.6* 21.0* 20.3*  LYMPHSABS 1.1  --  0.5*  --   --   MONOABS 0.2  --  0.4  --   --   EOSABS 0.0  --  0.0  --   --   BASOSABS 0.0  --  0.0  --   --      Chemistries  Recent Labs  Lab 10/05/2020 1755 10/10/20 0213 10/10/20 0418 10/11/20 0827  NA 141 136 140 144  K 4.2 >8.5* 4.4 5.2*  CL 107  --  107 111  CO2 18*  --  18* 17*  GLUCOSE 164*  --  105* 88  BUN 18  --  18 26*  CREATININE 1.63*  --  1.55* 2.37*  CALCIUM 8.0*  --  8.0* 7.6*  MG  --   --  1.8  --   AST 65*  --  537* 710*  ALT 23  --  54* 112*  ALKPHOS 198*  --  175* 154*  BILITOT 1.1  --  1.6* 1.6*   ------------------------------------------------------------------------------------------------------------------ No results for input(s): CHOL, HDL, LDLCALC, TRIG, CHOLHDL, LDLDIRECT in the last 72 hours.  No results found for: HGBA1C ------------------------------------------------------------------------------------------------------------------ No results for input(s): TSH, T4TOTAL, T3FREE, THYROIDAB in the last 72 hours.  Invalid input(s): FREET3 ------------------------------------------------------------------------------------------------------------------ No results for input(s): VITAMINB12, FOLATE, FERRITIN, TIBC, IRON, RETICCTPCT in the last 72 hours.  Coagulation profile Recent Labs  Lab 10/02/2020 1644 10/10/20 0230  INR 1.5* 1.7*    No results for input(s): DDIMER in the last 72 hours.  Cardiac Enzymes No results for input(s): CKMB, TROPONINI, MYOGLOBIN in the last 168 hours.  Invalid input(s): CK ------------------------------------------------------------------------------------------------------------------    Component Value Date/Time   BNP 82.4 08/14/2020 1020    Micro Results Recent Results (from the past 240 hour(s))  Blood Culture (routine x 2)     Status: None (Preliminary result)   Collection Time: 10/21/2020  5:20 PM   Specimen: BLOOD RIGHT ARM  Result Value Ref Range Status   Specimen Description BLOOD RIGHT ARM  Final   Special Requests   Final    BOTTLES DRAWN AEROBIC AND ANAEROBIC Blood Culture adequate volume   Culture    Final    NO GROWTH 4 DAYS Performed at [facility], [place] [address] [place], [place] [address]    Report Status PENDING  Incomplete  Blood Culture (routine x 2)     Status: None (Preliminary result)   Collection Time: 10/07/2020  5:30 PM   Specimen: BLOOD RIGHT ARM  Result Value Ref Range Status   Specimen Description BLOOD RIGHT ARM  Final   Special Requests   Final    BOTTLES DRAWN AEROBIC AND ANAEROBIC Blood Culture results may not be optimal due to an inadequate volume of blood received in culture bottles   Culture   Final    NO GROWTH 4 DAYS Performed at [facility], [place] [address] [place], [place] [address]    Report Status PENDING  Incomplete  SARS CORONAVIRUS 2 (TAT 6-24 HRS) Nasopharyngeal Nasopharyngeal Swab     Status: Abnormal   Collection Time: 10/01/2020  7:40 PM   Specimen: Nasopharyngeal Swab  Result Value Ref Range Status   SARS Coronavirus 2 POSITIVE (A) NEGATIVE Final    Comment: (NOTE) SARS-CoV-2 target nucleic acids are DETECTED.  The SARS-CoV-2 RNA is generally detectable in upper and lower respiratory specimens during the  acute phase of infection. Positive results are indicative of the presence of SARS-CoV-2 RNA. Clinical correlation with patient history and other diagnostic information is  necessary to determine patient infection status. Positive results do not rule out bacterial infection or co-infection with other viruses.  The expected result is Negative.  Fact Sheet for Patients: SugarRoll.be  Fact Sheet for Healthcare Providers: https://www.woods-mathews.com/  This test is not yet approved or cleared by the [place] FDA and  has been authorized for detection and/or diagnosis of SARS-CoV-2 by FDA under an Emergency Use Authorization (EUA). This EUA will remain  in effect (meaning this test can be used) for the duration of the COVID-19 declaration under Section 564(b)(1) of the Act, 21 U.  S.C. section 360bbb-3(b)(1), unless the authorization is terminated or revoked sooner.   Performed at [facility], [place] [address] [place], [place] [address]   Urine culture     Status: Abnormal   Collection Time: 10/20/2020  8:29 PM   Specimen: In/Out Cath Urine  Result Value Ref Range Status   Specimen Description IN/OUT CATH URINE  Final   Special Requests   Final    NONE Performed at [facility], [place] [address] [place], [place] [address]    Culture (A)  Final    >=100,000 COLONIES/mL ESCHERICHIA COLI >=100,000 COLONIES/mL PSEUDOMONAS AERUGINOSA    Report Status 10/13/2020 FINAL  Final   Organism ID, Bacteria ESCHERICHIA COLI (A)  Final   Organism ID, Bacteria PSEUDOMONAS AERUGINOSA (A)  Final      Susceptibility   Escherichia coli - MIC*    AMPICILLIN <=2 SENSITIVE Sensitive     CEFAZOLIN <=4 SENSITIVE Sensitive     CEFEPIME <=0.12 SENSITIVE Sensitive     CEFTRIAXONE <=0.25 SENSITIVE Sensitive     CIPROFLOXACIN <=0.25 SENSITIVE Sensitive     GENTAMICIN <=1 SENSITIVE Sensitive     IMIPENEM <=0.25 SENSITIVE Sensitive     NITROFURANTOIN <=16 SENSITIVE Sensitive     TRIMETH/SULFA <=20 SENSITIVE Sensitive     AMPICILLIN/SULBACTAM <=2 SENSITIVE Sensitive     PIP/TAZO <=4 SENSITIVE Sensitive     * >=100,000 COLONIES/mL ESCHERICHIA COLI   Pseudomonas aeruginosa - MIC*    CEFTAZIDIME 2 SENSITIVE Sensitive     CIPROFLOXACIN <=0.25 SENSITIVE Sensitive     GENTAMICIN <=1 SENSITIVE Sensitive     IMIPENEM 2 SENSITIVE Sensitive     PIP/TAZO <=4 SENSITIVE Sensitive     CEFEPIME 2 SENSITIVE Sensitive     * >=100,000 COLONIES/mL PSEUDOMONAS AERUGINOSA    Radiology Reports CT Abdomen Pelvis W Contrast  Result Date: 10/20/2020 CLINICAL DATA:  History of neuroendocrine tumor with possible infection EXAM: CT ABDOMEN AND PELVIS WITH CONTRAST TECHNIQUE: Multidetector CT imaging of the abdomen and pelvis was performed using the standard protocol following bolus  administration of intravenous contrast. CONTRAST:  20mL OMNIPAQUE IOHEXOL 300 MG/ML  SOLN COMPARISON:  08/14/2020 FINDINGS: Lower chest: Lung bases demonstrate bilateral pleural effusions right greater than left increased when compared with the prior exam. The degree of right lower lobe consolidation has also increased from the prior exam. The visualized pulmonary artery demonstrates some suggestion of filling defects within the left lower lobe pulmonary arterial branches. These are incompletely evaluated on this exam. Hepatobiliary: Liver again demonstrates changes consistent with diffuse hepatic metastatic disease. The degree of decreased attenuation within the liver has increased significantly in the interval from the prior exam consistent with progressive metastatic disease. Scattered calcifications are again identified when compared with the prior study.  Previously described central area of calcification is again identified and measures approximately 17 mm. The surrounding hepatic parenchyma however demonstrates diffuse decreased attenuation consistent with further metastatic disease. Gallbladder demonstrates dependent density likely related to milk of calcium. No biliary ductal dilatation is seen. The portal vein is attenuated but patent. This is similar to that noted on the prior exam. Pancreas: Unremarkable. No pancreatic ductal dilatation or surrounding inflammatory changes. Spleen: Normal in size without focal abnormality. Adrenals/Urinary Tract: Adrenal glands are within normal limits. The kidneys are well visualize within normal enhancement pattern bilaterally. Left renal cyst is again noted and stable. Delayed images demonstrate normal enhancement. The bladder is decompressed by Foley catheter. Stomach/Bowel: The colon is predominately decompressed the appendix is air-filled and within normal limits. Small bowel demonstrates some reactive wall thickening distally related to the associated ascites which  has increased in the interval from the prior exam. No obstructive changes are seen. Vascular/Lymphatic: Atherosclerotic calcifications are seen without aneurysmal dilatation. There is again noted a partially calcified central mesenteric mass which again measures 3.5 cm in greatest dimension stable in appearance from the prior exam. Reproductive: Prostate is again somewhat heterogeneous but within normal limits. Other: Significant ascites is noted when compared with the prior exam this has increased. Musculoskeletal: Degenerative changes of lumbar spine are noted. No acute bony abnormality is seen. IMPRESSION: Changes consistent with diffuse hepatic metastatic disease which appears to have increased in the interval from the prior exam. Increase in bilateral pleural effusions right greater than left with associated lower lobe consolidation on the right. Findings suspicious for pulmonary emboli in the left lower lobe. CTA of the chest could be performed to evaluate although felt to be best performed after short period of delay due to the patient's mildly elevated creatinine and decreased GFR. Increase in the degree of ascites consistent with the progressive metastatic disease. Stable central mesenteric mass consistent with the given clinical history. Some increased bowel wall thickening is noted in the distal small bowel. This could be related to the underlying mesenteric mass although may simply be reactive to the increase in ascites. Electronically Signed   By: Inez Catalina M.D.   On: 09/23/2020 20:34   DG Chest Port 1 View  Result Date: 10/22/2020 CLINICAL DATA:  Found unresponsive, possible sepsis EXAM: PORTABLE CHEST 1 VIEW COMPARISON:  08/14/2020 FINDINGS: Cardiac shadow is within normal limits. Increased density is noted in the right hemithorax likely related to a posteriorly layering effusion. No definitive underlying infiltrate is seen. Left lung is clear. No bony abnormality is noted. IMPRESSION:  Right-sided pleural effusion. No definitive infiltrate is seen although may be obscured in the lower lobe by the effusion. Electronically Signed   By: Inez Catalina M.D.   On: 09/27/2020 17:22   DG Chest Port 1V same Day  Result Date: 10/10/2020 CLINICAL DATA:  Altered mental status.  COVID positive EXAM: PORTABLE CHEST 1 VIEW COMPARISON:  10/16/2020 FINDINGS: Elevated right hemidiaphragm with right lower lobe airspace disease and right effusion, unchanged. Mild left lower lobe airspace disease has progressed. Pulmonary vascular congestion is present suggesting fluid overload. IMPRESSION: Pulmonary vascular congestion suggesting mild fluid overload Right lower lobe airspace disease and right effusion unchanged. Progression of left lower lobe atelectasis/infiltrate. Electronically Signed   By: Franchot Gallo M.D.   On: 10/10/2020 10:58   ECHOCARDIOGRAM COMPLETE  Result Date: 10/11/2020    ECHOCARDIOGRAM REPORT   Patient Name:   DAQUAVION CATALA Date of Exam: 10/11/2020 Medical Rec #:  [id number]  Height:       76.0 in Accession #:    [id number]     Weight:       202.8 lb Date of Birth:  04-24-1941      BSA:          2.228 m Patient Age:    38 years       BP:           92/57 mmHg Patient Gender: M              HR:           113 bpm. Exam Location:  Inpatient Procedure: 2D Echo, Cardiac Doppler and Color Doppler Indications:    Acute Respiratory Distress R06.03  History:        Patient has no prior history of Echocardiogram examinations.                 Arrythmias:RBBB; Risk Factors:Hypertension. COVID -19 Positive.  Sonographer:    Tiffany Dance Referring Phys: [place]  Sonographer Comments: Technically difficult study due to poor echo windows, no apical window, suboptimal parasternal window and suboptimal subcostal window. IMPRESSIONS  1. Left ventricular ejection fraction, by estimation, is not well visualized%. Left ventricular endocardial border not optimally defined to evaluate regional wall  motion.  2. Right ventricular systolic function was not well visualized.  3. The mitral valve was not well visualized.  4. The aortic valve is grossly normal. Aortic valve regurgitation is not visualized. No aortic stenosis is present. Comparison(s): No prior Echocardiogram. Conclusion(s)/Recommendation(s): Nondiagnostic study. Poor images in multiple windows. Unable to assess structure or function. FINDINGS  Left Ventricle: Left ventricular ejection fraction, by estimation, is not well visualized%. Left ventricular endocardial border not optimally defined to evaluate regional wall motion. Right Ventricle: Right ventricular systolic function was not well visualized. Left Atrium: Left atrial size was not well visualized. Right Atrium: Right atrial size was not well visualized. Pericardium: The pericardium was not assessed. Mitral Valve: The mitral valve was not well visualized. Tricuspid Valve: The tricuspid valve is not well visualized. Aortic Valve: The aortic valve is grossly normal. Aortic valve regurgitation is not visualized. No aortic stenosis is present. Pulmonic Valve: The pulmonic valve was not well visualized. Aorta: The aortic root was not well visualized, the ascending aorta was not well visualized and the aortic arch was not well visualized. Venous: The inferior vena cava was not well visualized. IAS/Shunts: The interatrial septum was not well visualized.  IVC IVC diam: 1.40 cm Buford Dresser MD Electronically signed by Buford Dresser MD Signature Date/Time: 10/11/2020/1:43:13 PM    Final    VAS [place] LOWER EXTREMITY VENOUS (DVT)  Result Date: 10/10/2020  Lower Venous DVT Study Indications: D-dimer, Covid-19, swelling, PE.  Risk Factors: Cancer Metastatic malignant neuroendocrine tumor to liver. Comparison Study: 07-03-2020 Prior bilateral lower extremity venous was negative                   for DVT. Performing Technologist: Darlin Coco RDMS  Examination Guidelines: A complete evaluation  includes B-mode imaging, spectral Doppler, color Doppler, and power Doppler as needed of all accessible portions of each vessel. Bilateral testing is considered an integral part of a complete examination. Limited examinations for reoccurring indications may be performed as noted. The reflux portion of the exam is performed with the patient in reverse Trendelenburg.  +---------+---------------+---------+-----------+----------+--------------+ RIGHT    CompressibilityPhasicitySpontaneityPropertiesThrombus Aging +---------+---------------+---------+-----------+----------+--------------+ CFV      Partial  Yes      Yes                  Acute          +---------+---------------+---------+-----------+----------+--------------+ SFJ      Partial        Yes      Yes                  Acute          +---------+---------------+---------+-----------+----------+--------------+ FV Prox  None                                         Acute          +---------+---------------+---------+-----------+----------+--------------+ FV Mid                  Yes      Yes                                 +---------+---------------+---------+-----------+----------+--------------+ FV DistalPartial        Yes      Yes                  Acute          +---------+---------------+---------+-----------+----------+--------------+ PFV      None                                         Acute          +---------+---------------+---------+-----------+----------+--------------+ POP      Partial        Yes      Yes                  Acute          +---------+---------------+---------+-----------+----------+--------------+ PTV                     No       No                                  +---------+---------------+---------+-----------+----------+--------------+ PERO                                                  Not visualized  +---------+---------------+---------+-----------+----------+--------------+   +---------+---------------+---------+-----------+----------+-------------------+ LEFT     CompressibilityPhasicitySpontaneityPropertiesThrombus Aging      +---------+---------------+---------+-----------+----------+-------------------+ CFV      None           No       No                   Acute               +---------+---------------+---------+-----------+----------+-------------------+ SFJ      None                                         Acute               +---------+---------------+---------+-----------+----------+-------------------+  FV Prox                 Yes      Yes                                      +---------+---------------+---------+-----------+----------+-------------------+ FV Mid                  Yes      Yes                                      +---------+---------------+---------+-----------+----------+-------------------+ FV Distal               No       No                   Acute               +---------+---------------+---------+-----------+----------+-------------------+ PFV                     Yes      Yes                                      +---------+---------------+---------+-----------+----------+-------------------+ POP                                                   Unable to visualize                                                       due to patient                                                            position            +---------+---------------+---------+-----------+----------+-------------------+ PTV                     No       No                   Acute               +---------+---------------+---------+-----------+----------+-------------------+ PERO                                                  Not visualized      +---------+---------------+---------+-----------+----------+-------------------+     Summary: RIGHT: - Findings consistent with acute deep vein thrombosis involving the right common femoral vein, SF junction, right femoral vein, right popliteal vein, right proximal profunda vein, and right posterior tibial veins. - No cystic structure found in the popliteal  fossa.  LEFT: - Findings consistent with acute deep vein thrombosis involving the left common femoral vein, SF junction, left femoral vein, and left posterior tibial veins.  *See table(s) above for measurements and observations. Electronically signed by Monica Martinez MD on 10/10/2020 at 5:59:55 PM.    Final

## 2020-10-15 DIAGNOSIS — N390 Urinary tract infection, site not specified: Secondary | ICD-10-CM | POA: Diagnosis not present

## 2020-10-15 MED ORDER — HYDROMORPHONE HCL 1 MG/ML IJ SOLN
1.0000 mg | Freq: Four times a day (QID) | INTRAMUSCULAR | Status: DC
  Administered 2020-10-15: 1 mg via INTRAVENOUS
  Filled 2020-10-15: qty 1

## 2020-10-15 MED ORDER — HYDROMORPHONE HCL 1 MG/ML IJ SOLN: 1.0000 mg | INTRAMUSCULAR | Status: DC | PRN

## 2020-10-16 NOTE — Progress Notes (Signed)
Pt expired @1945  on October 17, 2020 with family present. Pt is pronounced by Orland Penman, RN and Tammi Klippel, LPN. Family is at bedside. Family stated that they will call about funeral home info. Body will be prepped and taken to the morgue.

## 2020-10-23 NOTE — Progress Notes (Signed)
Palliative Medicine RN Note: Reviewed medication usage with RN. He is unable to take his scheduled Oxycodone, and he has used several doses of prn morphine.   I updated Dr Hilma Favors. She switched him to IV Dilaudid based on renal function, both scheduled and prn, and she stopped the prn morphine and scheduled oxycodone.  Patient remains in [place] isolation, which cannot be accommodated at [facility]. PMT would support a transition to residential hospice, but pt is unable to go [facility] specifically.   Marjie Skiff Jessah Danser, RN, BSN, [facility] Palliative Medicine Team Oct 28, 2020 1:59 PM Office [phone]

## 2020-10-23 NOTE — Progress Notes (Signed)
PROGRESS NOTE                                                                                                                                                                                                             Patient Demographics:    Charles Schmidt, is a 80 y.o. male, DOB - 10/17/1940, EZM:[id number]  Outpatient Primary MD for the patient is Nolene Ebbs, MD   Admit date - 09/29/2020   LOS - 6  Chief Complaint  Patient presents with  . Altered Mental Status       Brief Narrative: Patient is a 80 y.o. male with PMHx of metastatic carcinoid tumor, diarrhea related to carcinoid, BPH-with indwelling chronic Foley catheter, recurrent UTI-resident of SNF-who apparently was COVID-positive approximately 2 weeks back (was in isolation at SNF)-presenting with altered mental status-found to have acute metabolic encephalopathy in the context of COVID 19 infection/bacterial pneumonia and complicated UTI.  COVID-19 vaccinated status:   Significant Events: 1/18>> Admit to [facility] for metabolic encephalopathy-complicated UTI, [id number] infection.  Hypotension requiring multiple fluid boluses. 1/19>> persistent hypotension/worsening hypoxia-started on midodrine-IV fluid boluses-on 3-4 L of O2.  Significant studies: 1/18>> CT abdomen/pelvis with contrast: Diffuse hepatic metastatic disease-increased compared to prior exam.  Increased bilateral pleural effusions-with RLL>> LLL consolidation.  Suspicious pulmonary emboli in left lower lobe.  Stable central mesenteric mass consistent with history of carcinoid.  Increase in degree of ascites. 1/18>> chest x-ray: Right-sided pleural effusion, no definite infiltrate seen. 1/19>> bilateral lower extremity Doppler:+ve DVT   COVID-19 medications: Remdesivir: 1/18 x 1  Antibiotics: Cefepime: 1/18>>1/22  Microbiology data: 1/18 >>blood culture: No growth 1/18>> urine culture: E. coli,  Pseudomonas.-sensitivities pending. 08/14/20>> urine culture: Pansensitive Pseudomonas  Procedures: None  Consults: Palliative care, oncology  DVT prophylaxis: IV heparin infusion.    Subjective:   Unresponsive-seems to be actively dying.   Assessment  & Plan :   Acute metabolic encephalopathy: Multifactorial etiology-secondary to complicated UTI, hypotension/hypoxia.  No clinical improvement-has been transitioned to full comfort measures.  Complicated UTI: Has chronic indwelling Foley catheter likely contributing to recurrent UTIs.  Urine culture positive for E. coli/Pseudomonas.  Has completed a course of cefepime.   AKI on CKD stage IIIa: AKI likely hemodynamically mediated due to COVID-19 infection/poor oral intake, UTI and probably diarrhea related to carcinoid.  Unfortunately-with supportive care-renal function continues to worsen.  Not a candidate for aggressive care including RRT-after discussion by this MD and by palliative care team with family-he has been transitioned to comfort measures.  Acute hypoxic respiratory failure due to pulmonary embolism, HCAP/possible COVID-19 pneumonia and pleural effusion: Supportive care continues-on comfort care measures.  PE/bilateral lower extremity DVT: Provoked by COVID-19-poor functional status-underlying malignancy.  Was on IV heparin-he was not considered a candidate for more therapies including lytic/catheter guided therapy given his overall frailty/acute illness-poor prognosis-he is no longer on IV heparin-this was discontinued as patient is on full comfort measures.  Hypotension: Suspect this is multifactorial-related to hypovolemia in the setting of diarrhea due to carcinoid-probably bedbound status/chronic debility/frailty also contributing.  Random cortisol level appropriate.  Initially on midodrine-but that has been discontinued-on full comfort measures.  COVID-19 infection: No significant groundglass opacities/COVID-19 related  changes seen on imaging studies.  Since this has been ongoing for 2 weeks-not sure if Remdesivir has any role-further more-due to significantly elevated LFTs-Remdesivir discontinued.  Continue full comfort measures  History of metastatic carcinoid: Discussed with patient's oncologist-has had significant issues with debility/multiple hospitalizations-but had recently improved somewhat-now back in the hospital with above noted issues.  Awaiting input from patient's primary oncologist-but I did discuss with him yesterday-agrees with no CODE BLUE and gentle medical treatment.  Continue octreotide injection for now as per family wishes-although he is under full comfort measures.  Chronic urinary retention-chronic indwelling Foley catheter-BPH: Foley catheter in place-continue for comfort.  No longer on Flomax/finasteride as has been transitioned to full comfort measures.  Poor oral intake/failure to thrive syndrome: Very frail/debilitated baseline-now with further worsening due to acute infection and other issues as outlined above.  Per nursing staff-no significant oral intake f since admission.  Appears to be actively dying-see below.  Palliative care: Incredibly frail-debilitated-SNF resident-with metastatic carcinoid-now with COVID infection resulting in PE/DVT-hypotensive-hypoxic-given his frailty (albumin of 1.6!!)-tenuous clinical status-after extensive discussion with patient's family-he has been transitioned to full comfort measures.  He is actively dying-palliative care following.       Component Value Date/Time   BNP 82.4 08/14/2020 1020    GI prophylaxis: PPI  ABG:    Component Value Date/Time   HCO3 21.3 10/10/2020 0213   TCO2 23 10/10/2020 0213   ACIDBASEDEF 4.0 (H) 10/10/2020 0213   O2SAT 99.0 10/10/2020 0213    Vent Settings: N/A   Condition - Extremely Guarded  Family Communication  : Son-Mark-[phone]-updated over the phone 1/20-palliative care speaking with patient's  son on a daily basis.  Code Status :  DNR  Diet :  Diet Order            DIET - DYS 1 Room service appropriate? No; Fluid consistency: Nectar Thick  Diet effective now                  Disposition Plan  :   Status is: Inpatient  Remains inpatient appropriate because:Inpatient level of care appropriate due to severity of illness   Dispo: The patient is from: SNF              Anticipated d/c is to: Inpatient death              Anticipated d/c date is: > 3 days              Patient currently is not medically stable to d/c.    Barriers to discharge: Comfort care-actively dying-suspect inpatient death in a matter for few days.  Antimicorbials  :    Anti-infectives (  From admission, onward)   Start     Dose/Rate Route Frequency Ordered Stop   10/11/20 1000  remdesivir 100 mg in sodium chloride 0.9 % 100 mL IVPB  Status:  Discontinued       "Followed by" Linked Group Details   100 mg 200 mL/hr over 30 Minutes Intravenous Daily 10/10/20 0123 10/10/20 1121   10/10/20 0500  ceFEPIme (MAXIPIME) 2 g in sodium chloride 0.9 % 100 mL IVPB  Status:  Discontinued        2 g 200 mL/hr over 30 Minutes Intravenous Every 12 hours 10/08/2020 1947 10/14/20 0714   10/10/20 0200  remdesivir 200 mg in sodium chloride 0.9% 250 mL IVPB       "Followed by" Linked Group Details   200 mg 580 mL/hr over 30 Minutes Intravenous Once 10/10/20 0123 10/10/20 0323   10/17/2020 1700  ceFEPIme (MAXIPIME) 2 g in sodium chloride 0.9 % 100 mL IVPB        2 g 200 mL/hr over 30 Minutes Intravenous  Once 09/27/2020 1651 10/21/2020 1800      Inpatient Medications  Scheduled Meds: . antiseptic oral rinse  15 mL Topical BID  . octreotide  150 mcg Subcutaneous BID  . oxyCODONE  5 mg Oral Q6H   Continuous Infusions:  PRN Meds:.   Time Spent in minutes  15  See all Orders from today for further details   Oren Binet M.D on 11/05/2020 at 10:53 AM  To page go to [email or web address] - use universal password  [facility] -  Office  [phone]    Objective:   Vitals:   10/14/20 0444 10/14/20 1714 10/14/20 2017 11/05/2020 0940  BP: 136/74 116/68 121/81   Pulse: 100 (!) 108 (!) 110   Resp: 16 16 18 16   Temp: 98 F (36.7 C) 97.6 F (36.4 C) 97.8 F (36.6 C)   TempSrc: Oral  Oral   SpO2: 95%  93%   Weight:      Height:        Wt Readings from Last 3 Encounters:  10/10/20 92 kg  08/31/20 92 kg  08/21/20 97.2 kg     Intake/Output Summary (Last 24 hours) at 11/05/2020 1053 Last data filed at 10/14/2020 2019 Gross per 24 hour  Intake -  Output 350 ml  Net -350 ml     Physical Exam Appears comfortable.   Data Review:    CBC Recent Labs  Lab 10/05/2020 1755 10/10/20 0213 10/10/20 0230 10/11/20 0827 10/12/20 0438  WBC 4.9  --  7.0 4.3 5.7  HGB 9.3* 11.6* 9.8* 8.2* 8.2*  HCT 27.5* 34.0* 29.5* 22.8* 22.7*  PLT 124*  --  122* 91* 98*  MCV 77.2*  --  75.4* 72.6* 71.6*  MCH 26.1  --  25.1* 26.1 25.9*  MCHC 33.8  --  33.2 36.0 36.1*  RDW 21.6*  --  22.6* 21.0* 20.3*  LYMPHSABS 1.1  --  0.5*  --   --   MONOABS 0.2  --  0.4  --   --   EOSABS 0.0  --  0.0  --   --   BASOSABS 0.0  --  0.0  --   --     Chemistries  Recent Labs  Lab 09/29/2020 1755 10/10/20 0213 10/10/20 0418 10/11/20 0827  NA 141 136 140 144  K 4.2 >8.5* 4.4 5.2*  CL 107  --  107 111  CO2 18*  --  18* 17*  GLUCOSE 164*  --  105* 88  BUN 18  --  18 26*  CREATININE 1.63*  --  1.55* 2.37*  CALCIUM 8.0*  --  8.0* 7.6*  MG  --   --  1.8  --   AST 65*  --  537* 710*  ALT 23  --  54* 112*  ALKPHOS 198*  --  175* 154*  BILITOT 1.1  --  1.6* 1.6*   ------------------------------------------------------------------------------------------------------------------ No results for input(s): CHOL, HDL, LDLCALC, TRIG, CHOLHDL, LDLDIRECT in the last 72 hours.  No results found for: HGBA1C ------------------------------------------------------------------------------------------------------------------ No  results for input(s): TSH, T4TOTAL, T3FREE, THYROIDAB in the last 72 hours.  Invalid input(s): FREET3 ------------------------------------------------------------------------------------------------------------------ No results for input(s): VITAMINB12, FOLATE, FERRITIN, TIBC, IRON, RETICCTPCT in the last 72 hours.  Coagulation profile Recent Labs  Lab 10/21/2020 1644 10/10/20 0230  INR 1.5* 1.7*    No results for input(s): DDIMER in the last 72 hours.  Cardiac Enzymes No results for input(s): CKMB, TROPONINI, MYOGLOBIN in the last 168 hours.  Invalid input(s): CK ------------------------------------------------------------------------------------------------------------------    Component Value Date/Time   BNP 82.4 08/14/2020 1020    Micro Results Recent Results (from the past 240 hour(s))  Blood Culture (routine x 2)     Status: None   Collection Time: 10/22/2020  5:20 PM   Specimen: BLOOD RIGHT ARM  Result Value Ref Range Status   Specimen Description BLOOD RIGHT ARM  Final   Special Requests   Final    BOTTLES DRAWN AEROBIC AND ANAEROBIC Blood Culture adequate volume   Culture   Final    NO GROWTH 5 DAYS Performed at [facility], [address], [place], [place] [address]    Report Status 10/14/2020 FINAL  Final  Blood Culture (routine x 2)     Status: None   Collection Time: 09/27/2020  5:30 PM   Specimen: BLOOD RIGHT ARM  Result Value Ref Range Status   Specimen Description BLOOD RIGHT ARM  Final   Special Requests   Final    BOTTLES DRAWN AEROBIC AND ANAEROBIC Blood Culture results may not be optimal due to an inadequate volume of blood received in culture bottles   Culture   Final    NO GROWTH 5 DAYS Performed at [facility], [place] [address] [place], [place] [address]    Report Status 10/14/2020 FINAL  Final  SARS CORONAVIRUS 2 (TAT 6-24 HRS) Nasopharyngeal Nasopharyngeal Swab     Status: Abnormal   Collection Time: 09/24/2020  7:40 PM   Specimen:  Nasopharyngeal Swab  Result Value Ref Range Status   SARS Coronavirus 2 POSITIVE (A) NEGATIVE Final    Comment: (NOTE) SARS-CoV-2 target nucleic acids are DETECTED.  The SARS-CoV-2 RNA is generally detectable in upper and lower respiratory specimens during the acute phase of infection. Positive results are indicative of the presence of SARS-CoV-2 RNA. Clinical correlation with patient history and other diagnostic information is  necessary to determine patient infection status. Positive results do not rule out bacterial infection or co-infection with other viruses.  The expected result is Negative.  Fact Sheet for Patients: SugarRoll.be  Fact Sheet for Healthcare Providers: https://www.woods-mathews.com/  This test is not yet approved or cleared by the [place] FDA and  has been authorized for detection and/or diagnosis of SARS-CoV-2 by FDA under an Emergency Use Authorization (EUA). This EUA will remain  in effect (meaning this test can be used) for the duration of the COVID-19 declaration under Section 564(b)(1) of the Act, 21 U.  S.C. section 360bbb-3(b)(1), unless the authorization is terminated or revoked sooner.   Performed at [facility], [place] [address], [place], [place] [address]   Urine culture     Status: Abnormal   Collection Time: 10/01/2020  8:29 PM   Specimen: In/Out Cath Urine  Result Value Ref Range Status   Specimen Description IN/OUT CATH URINE  Final   Special Requests   Final    NONE Performed at [facility], [place] [address] [place], [place] [address]    Culture (A)  Final    >=100,000 COLONIES/mL ESCHERICHIA COLI >=100,000 COLONIES/mL PSEUDOMONAS AERUGINOSA    Report Status 10/13/2020 FINAL  Final   Organism ID, Bacteria ESCHERICHIA COLI (A)  Final   Organism ID, Bacteria PSEUDOMONAS AERUGINOSA (A)  Final      Susceptibility   Escherichia coli - MIC*    AMPICILLIN <=2 SENSITIVE Sensitive      CEFAZOLIN <=4 SENSITIVE Sensitive     CEFEPIME <=0.12 SENSITIVE Sensitive     CEFTRIAXONE <=0.25 SENSITIVE Sensitive     CIPROFLOXACIN <=0.25 SENSITIVE Sensitive     GENTAMICIN <=1 SENSITIVE Sensitive     IMIPENEM <=0.25 SENSITIVE Sensitive     NITROFURANTOIN <=16 SENSITIVE Sensitive     TRIMETH/SULFA <=20 SENSITIVE Sensitive     AMPICILLIN/SULBACTAM <=2 SENSITIVE Sensitive     PIP/TAZO <=4 SENSITIVE Sensitive     * >=100,000 COLONIES/mL ESCHERICHIA COLI   Pseudomonas aeruginosa - MIC*    CEFTAZIDIME 2 SENSITIVE Sensitive     CIPROFLOXACIN <=0.25 SENSITIVE Sensitive     GENTAMICIN <=1 SENSITIVE Sensitive     IMIPENEM 2 SENSITIVE Sensitive     PIP/TAZO <=4 SENSITIVE Sensitive     CEFEPIME 2 SENSITIVE Sensitive     * >=100,000 COLONIES/mL PSEUDOMONAS AERUGINOSA    Radiology Reports CT Abdomen Pelvis W Contrast  Result Date: 10/08/2020 CLINICAL DATA:  History of neuroendocrine tumor with possible infection EXAM: CT ABDOMEN AND PELVIS WITH CONTRAST TECHNIQUE: Multidetector CT imaging of the abdomen and pelvis was performed using the standard protocol following bolus administration of intravenous contrast. CONTRAST:  63mL OMNIPAQUE IOHEXOL 300 MG/ML  SOLN COMPARISON:  08/14/2020 FINDINGS: Lower chest: Lung bases demonstrate bilateral pleural effusions right greater than left increased when compared with the prior exam. The degree of right lower lobe consolidation has also increased from the prior exam. The visualized pulmonary artery demonstrates some suggestion of filling defects within the left lower lobe pulmonary arterial branches. These are incompletely evaluated on this exam. Hepatobiliary: Liver again demonstrates changes consistent with diffuse hepatic metastatic disease. The degree of decreased attenuation within the liver has increased significantly in the interval from the prior exam consistent with progressive metastatic disease. Scattered calcifications are again identified when  compared with the prior study. Previously described central area of calcification is again identified and measures approximately 17 mm. The surrounding hepatic parenchyma however demonstrates diffuse decreased attenuation consistent with further metastatic disease. Gallbladder demonstrates dependent density likely related to milk of calcium. No biliary ductal dilatation is seen. The portal vein is attenuated but patent. This is similar to that noted on the prior exam. Pancreas: Unremarkable. No pancreatic ductal dilatation or surrounding inflammatory changes. Spleen: Normal in size without focal abnormality. Adrenals/Urinary Tract: Adrenal glands are within normal limits. The kidneys are well visualize within normal enhancement pattern bilaterally. Left renal cyst is again noted and stable. Delayed images demonstrate normal enhancement. The bladder is decompressed by Foley catheter. Stomach/Bowel: The colon is predominately decompressed the appendix  is air-filled and within normal limits. Small bowel demonstrates some reactive wall thickening distally related to the associated ascites which has increased in the interval from the prior exam. No obstructive changes are seen. Vascular/Lymphatic: Atherosclerotic calcifications are seen without aneurysmal dilatation. There is again noted a partially calcified central mesenteric mass which again measures 3.5 cm in greatest dimension stable in appearance from the prior exam. Reproductive: Prostate is again somewhat heterogeneous but within normal limits. Other: Significant ascites is noted when compared with the prior exam this has increased. Musculoskeletal: Degenerative changes of lumbar spine are noted. No acute bony abnormality is seen. IMPRESSION: Changes consistent with diffuse hepatic metastatic disease which appears to have increased in the interval from the prior exam. Increase in bilateral pleural effusions right greater than left with associated lower lobe  consolidation on the right. Findings suspicious for pulmonary emboli in the left lower lobe. CTA of the chest could be performed to evaluate although felt to be best performed after short period of delay due to the patient's mildly elevated creatinine and decreased GFR. Increase in the degree of ascites consistent with the progressive metastatic disease. Stable central mesenteric mass consistent with the given clinical history. Some increased bowel wall thickening is noted in the distal small bowel. This could be related to the underlying mesenteric mass although may simply be reactive to the increase in ascites. Electronically Signed   By: Inez Catalina M.D.   On: 10/04/2020 20:34   DG Chest Port 1 View  Result Date: 10/02/2020 CLINICAL DATA:  Found unresponsive, possible sepsis EXAM: PORTABLE CHEST 1 VIEW COMPARISON:  08/14/2020 FINDINGS: Cardiac shadow is within normal limits. Increased density is noted in the right hemithorax likely related to a posteriorly layering effusion. No definitive underlying infiltrate is seen. Left lung is clear. No bony abnormality is noted. IMPRESSION: Right-sided pleural effusion. No definitive infiltrate is seen although may be obscured in the lower lobe by the effusion. Electronically Signed   By: Inez Catalina M.D.   On: 10/22/2020 17:22   DG Chest Port 1V same Day  Result Date: 10/10/2020 CLINICAL DATA:  Altered mental status.  COVID positive EXAM: PORTABLE CHEST 1 VIEW COMPARISON:  10/11/2020 FINDINGS: Elevated right hemidiaphragm with right lower lobe airspace disease and right effusion, unchanged. Mild left lower lobe airspace disease has progressed. Pulmonary vascular congestion is present suggesting fluid overload. IMPRESSION: Pulmonary vascular congestion suggesting mild fluid overload Right lower lobe airspace disease and right effusion unchanged. Progression of left lower lobe atelectasis/infiltrate. Electronically Signed   By: Franchot Gallo M.D.   On: 10/10/2020  10:58   ECHOCARDIOGRAM COMPLETE  Result Date: 10/11/2020    ECHOCARDIOGRAM REPORT   Patient Name:   HANFORD LUST Date of Exam: 10/11/2020 Medical Rec #:  [id number]      Height:       76.0 in Accession #:    [id number]     Weight:       202.8 lb Date of Birth:  1941/06/20      BSA:          2.228 m Patient Age:    56 years       BP:           92/57 mmHg Patient Gender: M              HR:           113 bpm. Exam Location:  Inpatient Procedure: 2D Echo, Cardiac Doppler and Color Doppler Indications:  Acute Respiratory Distress R06.03  History:        Patient has no prior history of Echocardiogram examinations.                 Arrythmias:RBBB; Risk Factors:Hypertension. COVID -19 Positive.  Sonographer:    Tiffany Dance Referring Phys: [place]  Sonographer Comments: Technically difficult study due to poor echo windows, no apical window, suboptimal parasternal window and suboptimal subcostal window. IMPRESSIONS  1. Left ventricular ejection fraction, by estimation, is not well visualized%. Left ventricular endocardial border not optimally defined to evaluate regional wall motion.  2. Right ventricular systolic function was not well visualized.  3. The mitral valve was not well visualized.  4. The aortic valve is grossly normal. Aortic valve regurgitation is not visualized. No aortic stenosis is present. Comparison(s): No prior Echocardiogram. Conclusion(s)/Recommendation(s): Nondiagnostic study. Poor images in multiple windows. Unable to assess structure or function. FINDINGS  Left Ventricle: Left ventricular ejection fraction, by estimation, is not well visualized%. Left ventricular endocardial border not optimally defined to evaluate regional wall motion. Right Ventricle: Right ventricular systolic function was not well visualized. Left Atrium: Left atrial size was not well visualized. Right Atrium: Right atrial size was not well visualized. Pericardium: The pericardium was not assessed. Mitral  Valve: The mitral valve was not well visualized. Tricuspid Valve: The tricuspid valve is not well visualized. Aortic Valve: The aortic valve is grossly normal. Aortic valve regurgitation is not visualized. No aortic stenosis is present. Pulmonic Valve: The pulmonic valve was not well visualized. Aorta: The aortic root was not well visualized, the ascending aorta was not well visualized and the aortic arch was not well visualized. Venous: The inferior vena cava was not well visualized. IAS/Shunts: The interatrial septum was not well visualized.  IVC IVC diam: 1.40 cm Buford Dresser MD Electronically signed by Buford Dresser MD Signature Date/Time: 10/11/2020/1:43:13 PM    Final    VAS [place] LOWER EXTREMITY VENOUS (DVT)  Result Date: 10/10/2020  Lower Venous DVT Study Indications: D-dimer, Covid-19, swelling, PE.  Risk Factors: Cancer Metastatic malignant neuroendocrine tumor to liver. Comparison Study: 07-03-2020 Prior bilateral lower extremity venous was negative                   for DVT. Performing Technologist: Darlin Coco RDMS  Examination Guidelines: A complete evaluation includes B-mode imaging, spectral Doppler, color Doppler, and power Doppler as needed of all accessible portions of each vessel. Bilateral testing is considered an integral part of a complete examination. Limited examinations for reoccurring indications may be performed as noted. The reflux portion of the exam is performed with the patient in reverse Trendelenburg.  +---------+---------------+---------+-----------+----------+--------------+ RIGHT    CompressibilityPhasicitySpontaneityPropertiesThrombus Aging +---------+---------------+---------+-----------+----------+--------------+ CFV      Partial        Yes      Yes                  Acute          +---------+---------------+---------+-----------+----------+--------------+ SFJ      Partial        Yes      Yes                  Acute           +---------+---------------+---------+-----------+----------+--------------+ FV Prox  None  Acute          +---------+---------------+---------+-----------+----------+--------------+ FV Mid                  Yes      Yes                                 +---------+---------------+---------+-----------+----------+--------------+ FV DistalPartial        Yes      Yes                  Acute          +---------+---------------+---------+-----------+----------+--------------+ PFV      None                                         Acute          +---------+---------------+---------+-----------+----------+--------------+ POP      Partial        Yes      Yes                  Acute          +---------+---------------+---------+-----------+----------+--------------+ PTV                     No       No                                  +---------+---------------+---------+-----------+----------+--------------+ PERO                                                  Not visualized +---------+---------------+---------+-----------+----------+--------------+   +---------+---------------+---------+-----------+----------+-------------------+ LEFT     CompressibilityPhasicitySpontaneityPropertiesThrombus Aging      +---------+---------------+---------+-----------+----------+-------------------+ CFV      None           No       No                   Acute               +---------+---------------+---------+-----------+----------+-------------------+ SFJ      None                                         Acute               +---------+---------------+---------+-----------+----------+-------------------+ FV Prox                 Yes      Yes                                      +---------+---------------+---------+-----------+----------+-------------------+ FV Mid                  Yes      Yes                                       +---------+---------------+---------+-----------+----------+-------------------+  FV Distal               No       No                   Acute               +---------+---------------+---------+-----------+----------+-------------------+ PFV                     Yes      Yes                                      +---------+---------------+---------+-----------+----------+-------------------+ POP                                                   Unable to visualize                                                       due to patient                                                            position            +---------+---------------+---------+-----------+----------+-------------------+ PTV                     No       No                   Acute               +---------+---------------+---------+-----------+----------+-------------------+ PERO                                                  Not visualized      +---------+---------------+---------+-----------+----------+-------------------+    Summary: RIGHT: - Findings consistent with acute deep vein thrombosis involving the right common femoral vein, SF junction, right femoral vein, right popliteal vein, right proximal profunda vein, and right posterior tibial veins. - No cystic structure found in the popliteal fossa.  LEFT: - Findings consistent with acute deep vein thrombosis involving the left common femoral vein, SF junction, left femoral vein, and left posterior tibial veins.  *See table(s) above for measurements and observations. Electronically signed by Monica Martinez MD on 10/10/2020 at 5:59:55 PM.    Final

## 2020-10-23 NOTE — Plan of Care (Signed)

## 2020-10-23 NOTE — Death Summary Note (Signed)
DEATH SUMMARY   Patient Details  Name: Charles Schmidt MRN: [id number] DOB: 1941-02-17  Admission/Discharge Information   Admit Date:  Oct 11, 2020  Date of Death: Date of Death: 10-17-20  Time of Death: Time of Death: 1945  Length of Stay: 7  Referring Physician: Nolene Ebbs, MD   Reason(s) for Hospitalization  Acute metabolic encephalopathy Acute hypoxic respiratory failure COVID-19 pneumonia Healthcare associated pneumonia Bilateral lower extremity DVT AKI on CKD stage IIIa  Diagnoses  Preliminary cause of death:  Secondary Diagnoses (including complications and co-morbidities):  Principal Problem:   Complicated UTI (urinary tract infection) Active Problems:   Acute metabolic encephalopathy   Lactic acidosis   COVID-19 virus infection   AKI (acute kidney injury) ([place])   Hypotension History of metastatic carcinoid   Brief Hospital Course (including significant findings, care, treatment, and services provided and events leading to death)  Brief Narrative: Patient is a 80 y.o. male with PMHx of metastatic carcinoid tumor, diarrhea related to carcinoid, BPH-with indwelling chronic Foley catheter, recurrent UTI-resident of SNF-who apparently was COVID-positive approximately 2 weeks back (was in isolation at SNF)-presenting with altered mental status-found to have acute metabolic encephalopathy in the context of COVID 19 infection/bacterial pneumonia and complicated UTI.  Even with supportive care-continue to deteriorate-after discussion with family by this MD and palliative care team-he was transitioned to full comfort measures.  Significant Events: 1/18>> Admit to [facility] for metabolic encephalopathy-complicated UTI, [id number] infection.  Hypotension requiring multiple fluid boluses. 1/19>> persistent hypotension/worsening hypoxia-started on midodrine-IV fluid boluses-on 3-4 L of O2.  Significant studies: 1/18>> CT abdomen/pelvis with contrast: Diffuse hepatic metastatic  disease-increased compared to prior exam.  Increased bilateral pleural effusions-with RLL>> LLL consolidation.  Suspicious pulmonary emboli in left lower lobe.  Stable central mesenteric mass consistent with history of carcinoid.  Increase in degree of ascites. 1/18>> chest x-ray: Right-sided pleural effusion, no definite infiltrate seen. 1/19>> bilateral lower extremity Doppler:+ve DVT  1/20>> Echo: Difficult study-unable to assess EF or right ventricular systolic function.  COVID-19 medications: Remdesivir: October 11, 2022 x 1  Antibiotics: Cefepime: 1/18>>1/22  Microbiology data: 2022/10/11 >>blood culture: No growth 1/18>> urine culture: E. coli, Pseudomonas 08/14/20>> urine culture: Pansensitive Pseudomonas  Procedures: None  Consults: Palliative care, oncology  Hospital course by problem list: Acute metabolic encephalopathy: Multifactorial etiology-secondary to complicated UTI, hypotension/hypoxi  Complicated UTI: Has chronic indwelling Foley catheter likely contributing to recurrent UTIs.  Urine culture positive for E. coli/Pseudomonas.  Completed a course of cefepime.   AKI on CKD stage IIIa: AKI likely hemodynamically mediated due to COVID-19 infection/poor oral intake, UTI and probably diarrhea related to carcinoid.  Unfortunately-with supportive care-renal function continues to worsen.  Not a candidate for aggressive care including RRT-after discussion by this MD and by palliative care team with family-was transitioned to comfort measures.  Acute hypoxic respiratory failure due to pulmonary embolism, HCAP/possible COVID-19 pneumonia and pleural effusion: Briefly on Remdesivir and cefepime-but discontinued as patient was transitioned to comfort measures.  PE/bilateral lower extremity DVT: Provoked by COVID-19-poor functional status-underlying malignancy.  Was on IV heparin-he was not considered a candidate for more therapies including lytic/catheter guided therapy given his overall  frailty/acute illness-poor prognosis-once he was transitioned to comfort measures-all anticoagulation was discontinued.    Hypotension: Suspect this is multifactorial-related to hypovolemia in the setting of diarrhea due to carcinoid-probably bedbound status/chronic debility/frailty also contributing.  Random cortisol level appropriate.  Initially on midodrine-but that discontinued-as he was on full comfort measures.  COVID-19 infection: No significant groundglass opacities/COVID-19 related changes seen on imaging  studies.  Since this was ongoing for 2 weeks-Remdesivir not felt to be indicated.  Also LFTs were significantly elevated.    History of metastatic carcinoid: Discussed with patient's oncologist-has had significant issues with debility/multiple hospitalizations-but had recently improved somewhat-now back in the hospital with above noted issues.  Patient's oncologist-was in full agreement to continue with DNR status-and encouraged transition to comfort measures.  He was maintained on octreotide for comfort.   Chronic urinary retention-chronic indwelling Foley catheter-BPH: Foley catheter was continued for comfort.  No longer on Flomax/finasteride as  transitioned to full comfort measures.  Poor oral intake/failure to thrive syndrome: Very frail/debilitated baseline-now with further worsening due to acute infection and other issues as outlined above.  Per nursing staff-no significant oral intake  since admission.    Palliative care: Incredibly frail-debilitated-SNF resident-with metastatic carcinoid-now with COVID infection resulting in PE/DVT-hypotensive-hypoxic-given his frailty (albumin of 1.6!!)-tenuous clinical status-after extensive discussion with patient's family-was transitioned to full comfort measures.     Pertinent Labs and Studies  Significant Diagnostic Studies CT Abdomen Pelvis W Contrast  Result Date: 10/12/2020 CLINICAL DATA:  History of neuroendocrine tumor with  possible infection EXAM: CT ABDOMEN AND PELVIS WITH CONTRAST TECHNIQUE: Multidetector CT imaging of the abdomen and pelvis was performed using the standard protocol following bolus administration of intravenous contrast. CONTRAST:  63mL OMNIPAQUE IOHEXOL 300 MG/ML  SOLN COMPARISON:  08/14/2020 FINDINGS: Lower chest: Lung bases demonstrate bilateral pleural effusions right greater than left increased when compared with the prior exam. The degree of right lower lobe consolidation has also increased from the prior exam. The visualized pulmonary artery demonstrates some suggestion of filling defects within the left lower lobe pulmonary arterial branches. These are incompletely evaluated on this exam. Hepatobiliary: Liver again demonstrates changes consistent with diffuse hepatic metastatic disease. The degree of decreased attenuation within the liver has increased significantly in the interval from the prior exam consistent with progressive metastatic disease. Scattered calcifications are again identified when compared with the prior study. Previously described central area of calcification is again identified and measures approximately 17 mm. The surrounding hepatic parenchyma however demonstrates diffuse decreased attenuation consistent with further metastatic disease. Gallbladder demonstrates dependent density likely related to milk of calcium. No biliary ductal dilatation is seen. The portal vein is attenuated but patent. This is similar to that noted on the prior exam. Pancreas: Unremarkable. No pancreatic ductal dilatation or surrounding inflammatory changes. Spleen: Normal in size without focal abnormality. Adrenals/Urinary Tract: Adrenal glands are within normal limits. The kidneys are well visualize within normal enhancement pattern bilaterally. Left renal cyst is again noted and stable. Delayed images demonstrate normal enhancement. The bladder is decompressed by Foley catheter. Stomach/Bowel: The colon is  predominately decompressed the appendix is air-filled and within normal limits. Small bowel demonstrates some reactive wall thickening distally related to the associated ascites which has increased in the interval from the prior exam. No obstructive changes are seen. Vascular/Lymphatic: Atherosclerotic calcifications are seen without aneurysmal dilatation. There is again noted a partially calcified central mesenteric mass which again measures 3.5 cm in greatest dimension stable in appearance from the prior exam. Reproductive: Prostate is again somewhat heterogeneous but within normal limits. Other: Significant ascites is noted when compared with the prior exam this has increased. Musculoskeletal: Degenerative changes of lumbar spine are noted. No acute bony abnormality is seen. IMPRESSION: Changes consistent with diffuse hepatic metastatic disease which appears to have increased in the interval from the prior exam. Increase in bilateral pleural effusions right greater than left  with associated lower lobe consolidation on the right. Findings suspicious for pulmonary emboli in the left lower lobe. CTA of the chest could be performed to evaluate although felt to be best performed after short period of delay due to the patient's mildly elevated creatinine and decreased GFR. Increase in the degree of ascites consistent with the progressive metastatic disease. Stable central mesenteric mass consistent with the given clinical history. Some increased bowel wall thickening is noted in the distal small bowel. This could be related to the underlying mesenteric mass although may simply be reactive to the increase in ascites. Electronically Signed   By: Inez Catalina M.D.   On: 09/27/2020 20:34   DG Chest Port 1 View  Result Date: 10/22/2020 CLINICAL DATA:  Found unresponsive, possible sepsis EXAM: PORTABLE CHEST 1 VIEW COMPARISON:  08/14/2020 FINDINGS: Cardiac shadow is within normal limits. Increased density is noted in the  right hemithorax likely related to a posteriorly layering effusion. No definitive underlying infiltrate is seen. Left lung is clear. No bony abnormality is noted. IMPRESSION: Right-sided pleural effusion. No definitive infiltrate is seen although may be obscured in the lower lobe by the effusion. Electronically Signed   By: Inez Catalina M.D.   On: 09/25/2020 17:22   DG Chest Port 1V same Day  Result Date: 10/10/2020 CLINICAL DATA:  Altered mental status.  COVID positive EXAM: PORTABLE CHEST 1 VIEW COMPARISON:  10/17/2020 FINDINGS: Elevated right hemidiaphragm with right lower lobe airspace disease and right effusion, unchanged. Mild left lower lobe airspace disease has progressed. Pulmonary vascular congestion is present suggesting fluid overload. IMPRESSION: Pulmonary vascular congestion suggesting mild fluid overload Right lower lobe airspace disease and right effusion unchanged. Progression of left lower lobe atelectasis/infiltrate. Electronically Signed   By: Franchot Gallo M.D.   On: 10/10/2020 10:58   ECHOCARDIOGRAM COMPLETE  Result Date: 10/11/2020    ECHOCARDIOGRAM REPORT   Patient Name:   MICHELLE VANHISE Date of Exam: 10/11/2020 Medical Rec #:  [id number]      Height:       76.0 in Accession #:    [id number]     Weight:       202.8 lb Date of Birth:  Jan 01, 1941      BSA:          2.228 m Patient Age:    72 years       BP:           92/57 mmHg Patient Gender: M              HR:           113 bpm. Exam Location:  Inpatient Procedure: 2D Echo, Cardiac Doppler and Color Doppler Indications:    Acute Respiratory Distress R06.03  History:        Patient has no prior history of Echocardiogram examinations.                 Arrythmias:RBBB; Risk Factors:Hypertension. COVID -19 Positive.  Sonographer:    Tiffany Dance Referring Phys: [place]  Sonographer Comments: Technically difficult study due to poor echo windows, no apical window, suboptimal parasternal window and suboptimal subcostal window.  IMPRESSIONS  1. Left ventricular ejection fraction, by estimation, is not well visualized%. Left ventricular endocardial border not optimally defined to evaluate regional wall motion.  2. Right ventricular systolic function was not well visualized.  3. The mitral valve was not well visualized.  4. The aortic valve is grossly normal. Aortic valve regurgitation is not  visualized. No aortic stenosis is present. Comparison(s): No prior Echocardiogram. Conclusion(s)/Recommendation(s): Nondiagnostic study. Poor images in multiple windows. Unable to assess structure or function. FINDINGS  Left Ventricle: Left ventricular ejection fraction, by estimation, is not well visualized%. Left ventricular endocardial border not optimally defined to evaluate regional wall motion. Right Ventricle: Right ventricular systolic function was not well visualized. Left Atrium: Left atrial size was not well visualized. Right Atrium: Right atrial size was not well visualized. Pericardium: The pericardium was not assessed. Mitral Valve: The mitral valve was not well visualized. Tricuspid Valve: The tricuspid valve is not well visualized. Aortic Valve: The aortic valve is grossly normal. Aortic valve regurgitation is not visualized. No aortic stenosis is present. Pulmonic Valve: The pulmonic valve was not well visualized. Aorta: The aortic root was not well visualized, the ascending aorta was not well visualized and the aortic arch was not well visualized. Venous: The inferior vena cava was not well visualized. IAS/Shunts: The interatrial septum was not well visualized.  IVC IVC diam: 1.40 cm Buford Dresser MD Electronically signed by Buford Dresser MD Signature Date/Time: 10/11/2020/1:43:13 PM    Final    VAS [place] LOWER EXTREMITY VENOUS (DVT)  Result Date: 10/10/2020  Lower Venous DVT Study Indications: D-dimer, Covid-19, swelling, PE.  Risk Factors: Cancer Metastatic malignant neuroendocrine tumor to liver. Comparison Study:  07-03-2020 Prior bilateral lower extremity venous was negative                   for DVT. Performing Technologist: Darlin Coco RDMS  Examination Guidelines: A complete evaluation includes B-mode imaging, spectral Doppler, color Doppler, and power Doppler as needed of all accessible portions of each vessel. Bilateral testing is considered an integral part of a complete examination. Limited examinations for reoccurring indications may be performed as noted. The reflux portion of the exam is performed with the patient in reverse Trendelenburg.  +---------+---------------+---------+-----------+----------+--------------+ RIGHT    CompressibilityPhasicitySpontaneityPropertiesThrombus Aging +---------+---------------+---------+-----------+----------+--------------+ CFV      Partial        Yes      Yes                  Acute          +---------+---------------+---------+-----------+----------+--------------+ SFJ      Partial        Yes      Yes                  Acute          +---------+---------------+---------+-----------+----------+--------------+ FV Prox  None                                         Acute          +---------+---------------+---------+-----------+----------+--------------+ FV Mid                  Yes      Yes                                 +---------+---------------+---------+-----------+----------+--------------+ FV DistalPartial        Yes      Yes                  Acute          +---------+---------------+---------+-----------+----------+--------------+ PFV      None  Acute          +---------+---------------+---------+-----------+----------+--------------+ POP      Partial        Yes      Yes                  Acute          +---------+---------------+---------+-----------+----------+--------------+ PTV                     No       No                                   +---------+---------------+---------+-----------+----------+--------------+ PERO                                                  Not visualized +---------+---------------+---------+-----------+----------+--------------+   +---------+---------------+---------+-----------+----------+-------------------+ LEFT     CompressibilityPhasicitySpontaneityPropertiesThrombus Aging      +---------+---------------+---------+-----------+----------+-------------------+ CFV      None           No       No                   Acute               +---------+---------------+---------+-----------+----------+-------------------+ SFJ      None                                         Acute               +---------+---------------+---------+-----------+----------+-------------------+ FV Prox                 Yes      Yes                                      +---------+---------------+---------+-----------+----------+-------------------+ FV Mid                  Yes      Yes                                      +---------+---------------+---------+-----------+----------+-------------------+ FV Distal               No       No                   Acute               +---------+---------------+---------+-----------+----------+-------------------+ PFV                     Yes      Yes                                      +---------+---------------+---------+-----------+----------+-------------------+ POP  Unable to visualize                                                       due to patient                                                            position            +---------+---------------+---------+-----------+----------+-------------------+ PTV                     No       No                   Acute               +---------+---------------+---------+-----------+----------+-------------------+ PERO                                                   Not visualized      +---------+---------------+---------+-----------+----------+-------------------+    Summary: RIGHT: - Findings consistent with acute deep vein thrombosis involving the right common femoral vein, SF junction, right femoral vein, right popliteal vein, right proximal profunda vein, and right posterior tibial veins. - No cystic structure found in the popliteal fossa.  LEFT: - Findings consistent with acute deep vein thrombosis involving the left common femoral vein, SF junction, left femoral vein, and left posterior tibial veins.  *See table(s) above for measurements and observations. Electronically signed by Monica Martinez MD on 10/10/2020 at 5:59:55 PM.    Final     Microbiology Recent Results (from the past 240 hour(s))  Blood Culture (routine x 2)     Status: None   Collection Time: 10/13/2020  5:20 PM   Specimen: BLOOD RIGHT ARM  Result Value Ref Range Status   Specimen Description BLOOD RIGHT ARM  Final   Special Requests   Final    BOTTLES DRAWN AEROBIC AND ANAEROBIC Blood Culture adequate volume   Culture   Final    NO GROWTH 5 DAYS Performed at [facility], [address] [place], [place] [address]    Report Status 10/14/2020 FINAL  Final  Blood Culture (routine x 2)     Status: None   Collection Time: 09/24/2020  5:30 PM   Specimen: BLOOD RIGHT ARM  Result Value Ref Range Status   Specimen Description BLOOD RIGHT ARM  Final   Special Requests   Final    BOTTLES DRAWN AEROBIC AND ANAEROBIC Blood Culture results may not be optimal due to an inadequate volume of blood received in culture bottles   Culture   Final    NO GROWTH 5 DAYS Performed at [facility], [place] [address], [place], [place] [address]    Report Status 10/14/2020 FINAL  Final  SARS CORONAVIRUS 2 (TAT 6-24 HRS) Nasopharyngeal Nasopharyngeal Swab     Status: Abnormal   Collection Time: 10/20/2020  7:40 PM   Specimen: Nasopharyngeal Swab  Result  Value Ref Range Status  SARS Coronavirus 2 POSITIVE (A) NEGATIVE Final    Comment: (NOTE) SARS-CoV-2 target nucleic acids are DETECTED.  The SARS-CoV-2 RNA is generally detectable in upper and lower respiratory specimens during the acute phase of infection. Positive results are indicative of the presence of SARS-CoV-2 RNA. Clinical correlation with patient history and other diagnostic information is  necessary to determine patient infection status. Positive results do not rule out bacterial infection or co-infection with other viruses.  The expected result is Negative.  Fact Sheet for Patients: SugarRoll.be  Fact Sheet for Healthcare Providers: https://www.woods-mathews.com/  This test is not yet approved or cleared by the [place] FDA and  has been authorized for detection and/or diagnosis of SARS-CoV-2 by FDA under an Emergency Use Authorization (EUA). This EUA will remain  in effect (meaning this test can be used) for the duration of the COVID-19 declaration under Section 564(b)(1) of the Act, 21 U. S.C. section 360bbb-3(b)(1), unless the authorization is terminated or revoked sooner.   Performed at [facility], [place] [address] [place], [place] [address]   Urine culture     Status: Abnormal   Collection Time: 10/06/2020  8:29 PM   Specimen: In/Out Cath Urine  Result Value Ref Range Status   Specimen Description IN/OUT CATH URINE  Final   Special Requests   Final    NONE Performed at [facility], [place] [address], [place], [place] [address]    Culture (A)  Final    >=100,000 COLONIES/mL ESCHERICHIA COLI >=100,000 COLONIES/mL PSEUDOMONAS AERUGINOSA    Report Status 10/13/2020 FINAL  Final   Organism ID, Bacteria ESCHERICHIA COLI (A)  Final   Organism ID, Bacteria PSEUDOMONAS AERUGINOSA (A)  Final      Susceptibility   Escherichia coli - MIC*    AMPICILLIN <=2 SENSITIVE Sensitive     CEFAZOLIN <=4 SENSITIVE  Sensitive     CEFEPIME <=0.12 SENSITIVE Sensitive     CEFTRIAXONE <=0.25 SENSITIVE Sensitive     CIPROFLOXACIN <=0.25 SENSITIVE Sensitive     GENTAMICIN <=1 SENSITIVE Sensitive     IMIPENEM <=0.25 SENSITIVE Sensitive     NITROFURANTOIN <=16 SENSITIVE Sensitive     TRIMETH/SULFA <=20 SENSITIVE Sensitive     AMPICILLIN/SULBACTAM <=2 SENSITIVE Sensitive     PIP/TAZO <=4 SENSITIVE Sensitive     * >=100,000 COLONIES/mL ESCHERICHIA COLI   Pseudomonas aeruginosa - MIC*    CEFTAZIDIME 2 SENSITIVE Sensitive     CIPROFLOXACIN <=0.25 SENSITIVE Sensitive     GENTAMICIN <=1 SENSITIVE Sensitive     IMIPENEM 2 SENSITIVE Sensitive     PIP/TAZO <=4 SENSITIVE Sensitive     CEFEPIME 2 SENSITIVE Sensitive     * >=100,000 COLONIES/mL PSEUDOMONAS AERUGINOSA    Lab Basic Metabolic Panel: Recent Labs  Lab 10/11/2020 1755 10/10/20 0213 10/10/20 0418 10/11/20 0827  NA 141 136 140 144  K 4.2 >8.5* 4.4 5.2*  CL 107  --  107 111  CO2 18*  --  18* 17*  GLUCOSE 164*  --  105* 88  BUN 18  --  18 26*  CREATININE 1.63*  --  1.55* 2.37*  CALCIUM 8.0*  --  8.0* 7.6*  MG  --   --  1.8  --   PHOS  --   --  4.6  --    Liver Function Tests: Recent Labs  Lab 09/28/2020 1755 10/10/20 0418 10/11/20 0827  AST 65* 537* 710*  ALT 23 54* 112*  ALKPHOS 198* 175* 154*  BILITOT 1.1  1.6* 1.6*  PROT 7.0 5.9* 5.2*  ALBUMIN 1.8* 1.6* 1.4*   No results for input(s): LIPASE, AMYLASE in the last 168 hours. Recent Labs  Lab 10/10/20 0230  AMMONIA 71*   CBC: Recent Labs  Lab 09/24/2020 1755 10/10/20 0213 10/10/20 0230 10/11/20 0827 10/12/20 0438  WBC 4.9  --  7.0 4.3 5.7  NEUTROABS 3.5  --  6.1  --   --   HGB 9.3* 11.6* 9.8* 8.2* 8.2*  HCT 27.5* 34.0* 29.5* 22.8* 22.7*  MCV 77.2*  --  75.4* 72.6* 71.6*  PLT 124*  --  122* 91* 98*   Cardiac Enzymes: No results for input(s): CKTOTAL, CKMB, CKMBINDEX, TROPONINI in the last 168 hours. Sepsis Labs: Recent Labs  Lab 10/18/2020 1708 09/23/2020 1755  09/28/2020 1925 10/10/20 0230 10/10/20 0418 10/11/20 0827 10/12/20 0438  PROCALCITON  --   --   --   --  4.78  --   --   WBC  --  4.9  --  7.0  --  4.3 5.7  LATICACIDVEN 8.1*  --  >11.0* 7.5*  --   --   --     Procedures/Operations     [facility] 10/21/2020, 2:50 PM

## 2020-10-23 DEATH — deceased

## 2022-03-16 IMAGING — US US ABDOMEN LIMITED
1 series · 14 of 25 positions shown · non-contrast
Comparison: Abdominal CT from yesterday

CLINICAL DATA: Elevated LFTs

EXAM:
ULTRASOUND ABDOMEN LIMITED RIGHT UPPER QUADRANT

[Series 1: us abdomen limited ruq · 14 of 40 slices shown]
[im 1/40]
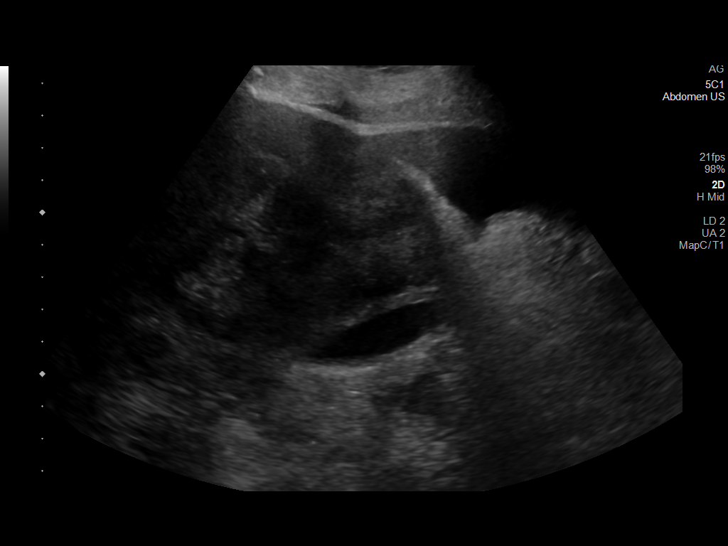
[im 4/40]
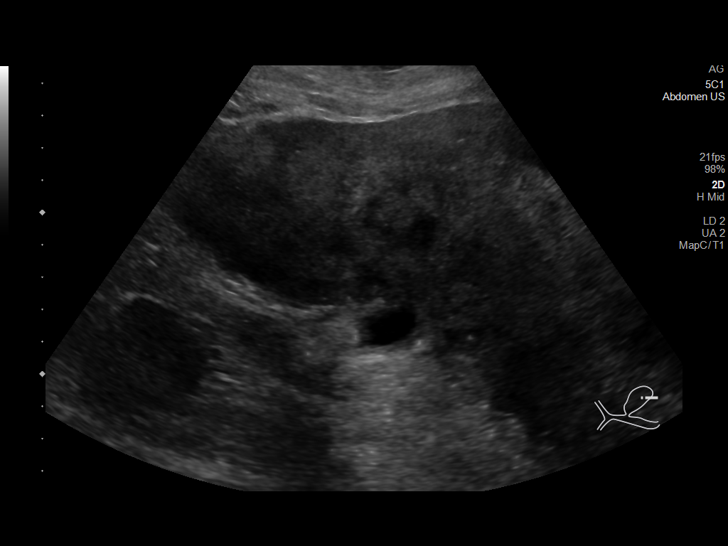
[im 7/40]
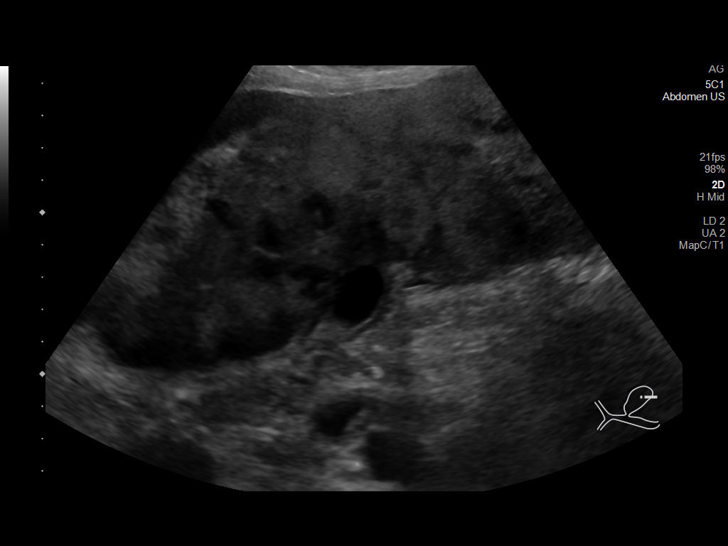
[im 10/40]
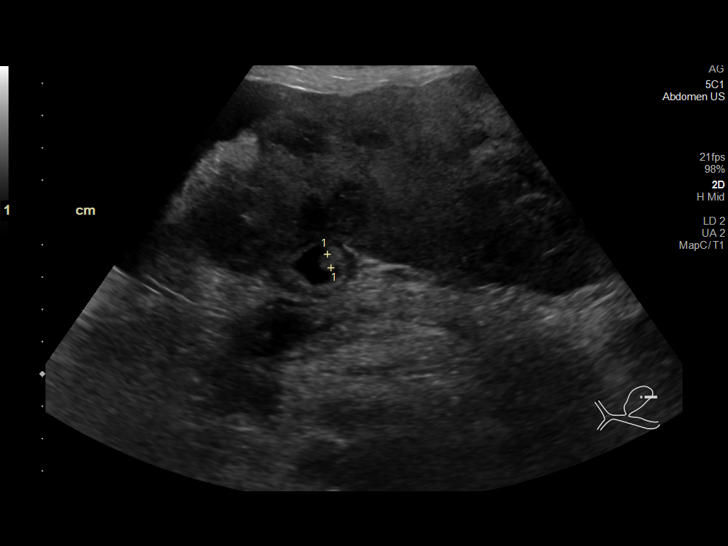
[im 14/40]
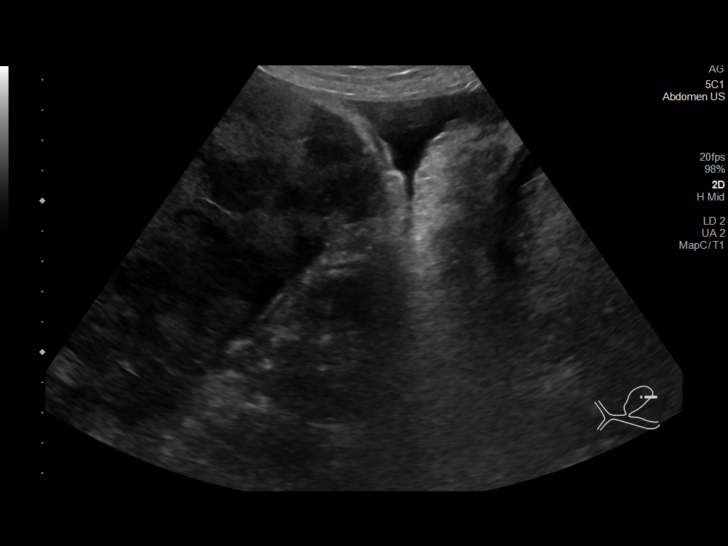
[im 15/40]
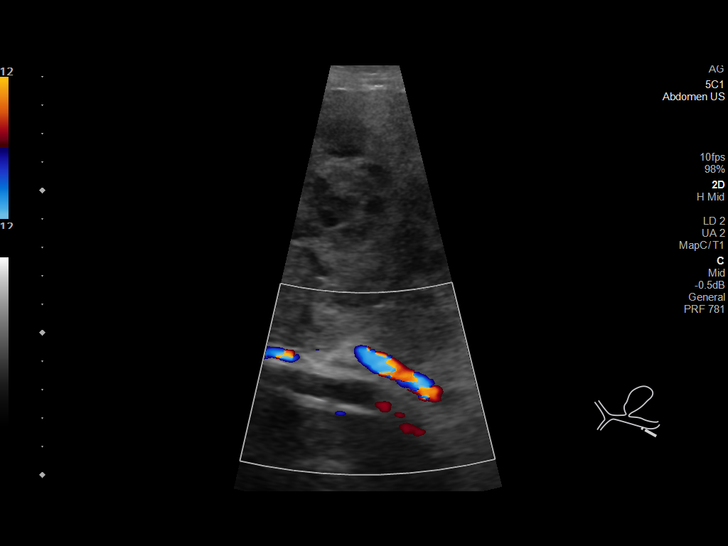
[im 18/40]
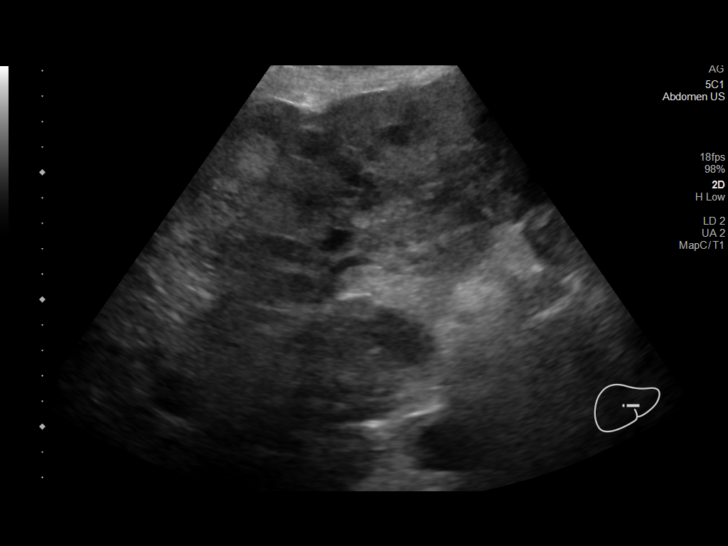
[im 22/40]
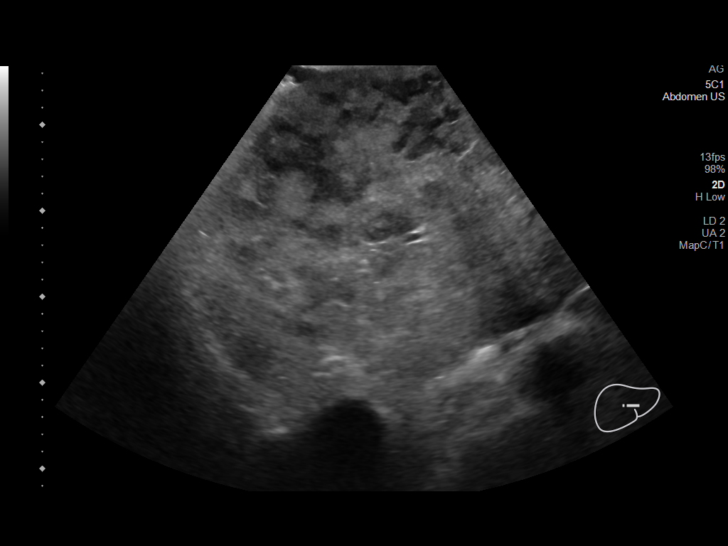
[im 25/40]
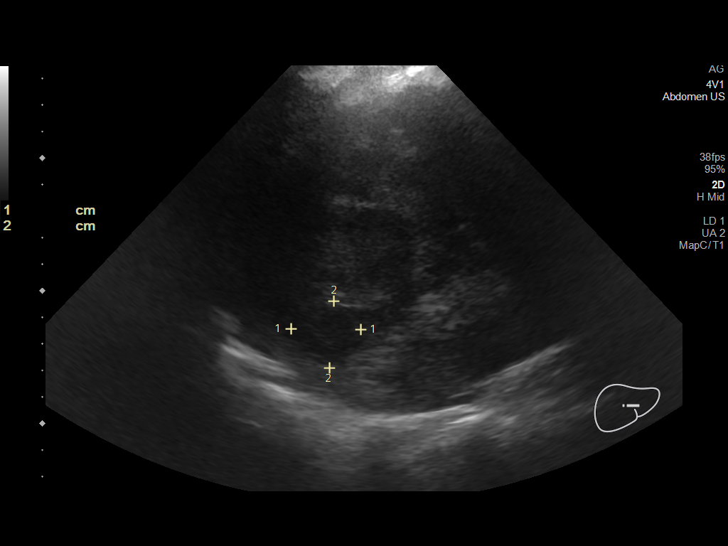
[im 27/40]
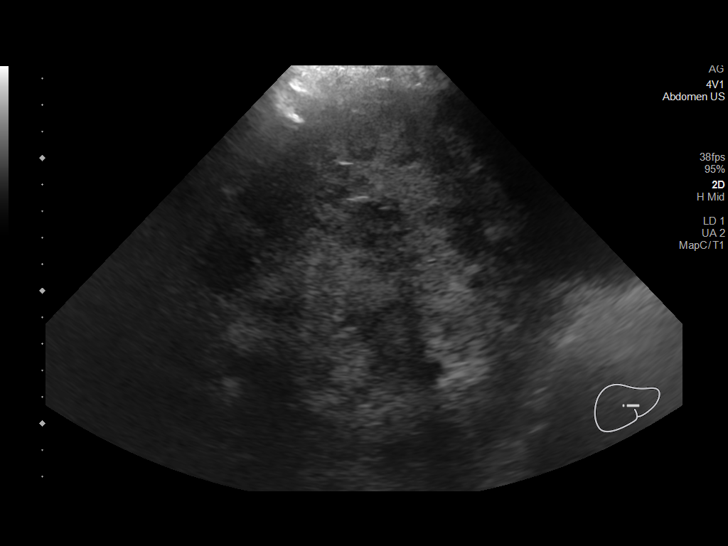
[im 30/40]
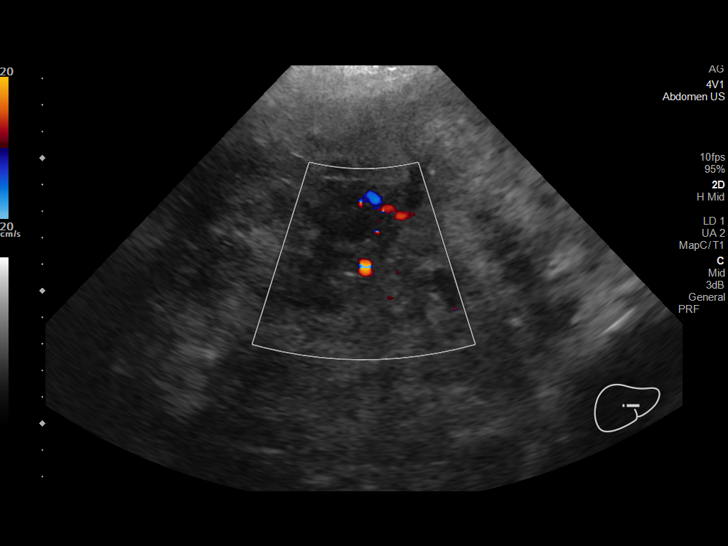
[im 33/40]
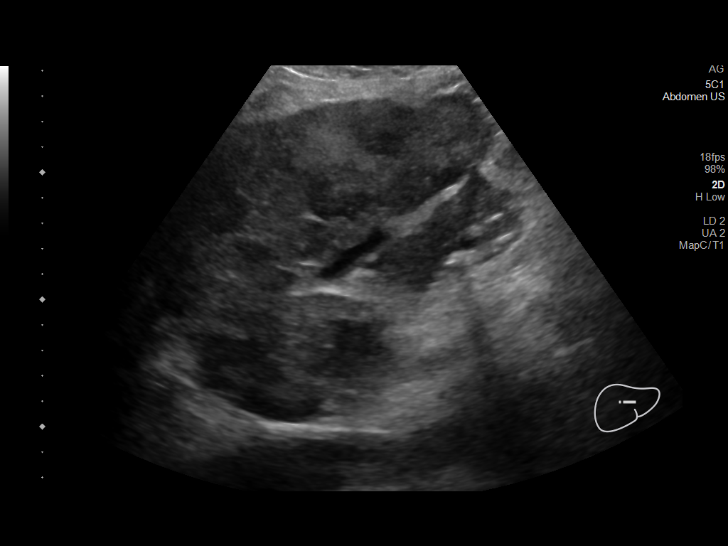
[im 36/40]
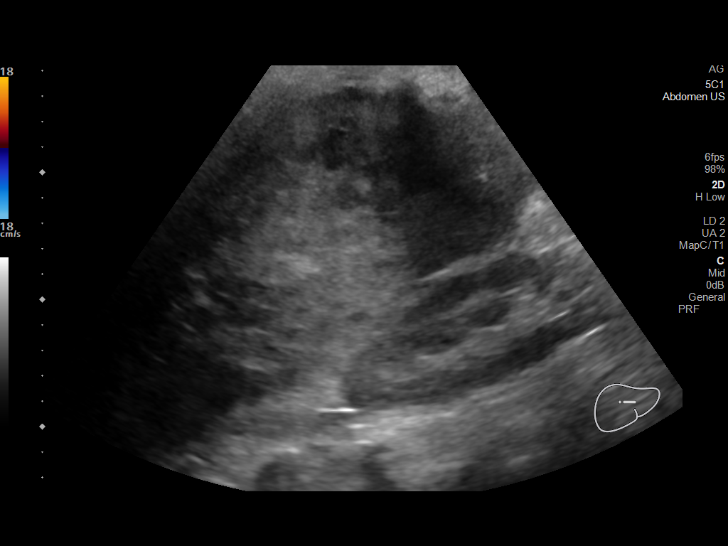
[im 40/40]
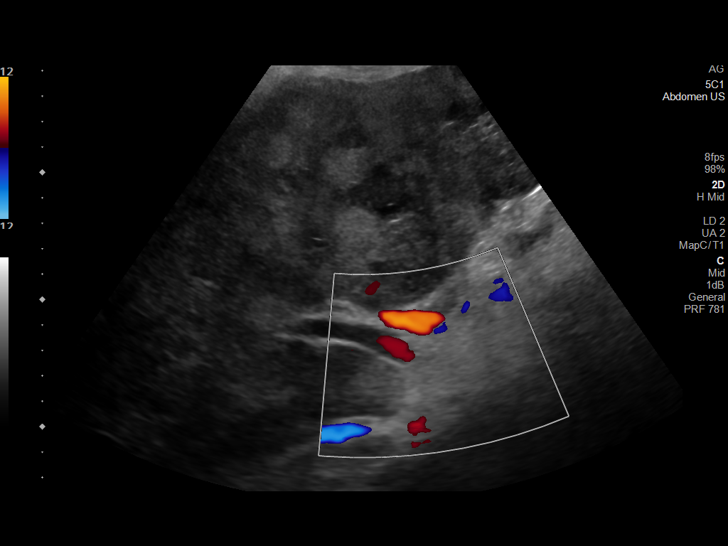

[14 of 25 positions shown; findings below may reference images not displayed]

FINDINGS: Gallbladder:

Polypoid mass measuring 4 mm.  No calculi or focal tenderness.

Common bile duct:

Not visualized.  No intrahepatic bile duct dilatation seen

Liver:

Innumerable metastases with diffuse heterogeneity of the liver,
which is lobulated by CT. Small volume ascites noted in the right
upper quadrant. Portal vein is patent on color Doppler imaging with
normal direction of blood flow towards the liver.
IMPRESSION: 1. Widespread hepatic metastatic disease.
2. 4 mm gallbladder polyp.

## 2022-03-17 IMAGING — DX DG ABD PORTABLE 1V
2 series · 2 of 2 positions shown · non-contrast
Comparison: 06/06/2020

CLINICAL DATA: Abdominal pain, elevated troponin

EXAM:
PORTABLE ABDOMEN - 1 VIEW

[abdomen kub (1 of 2)]
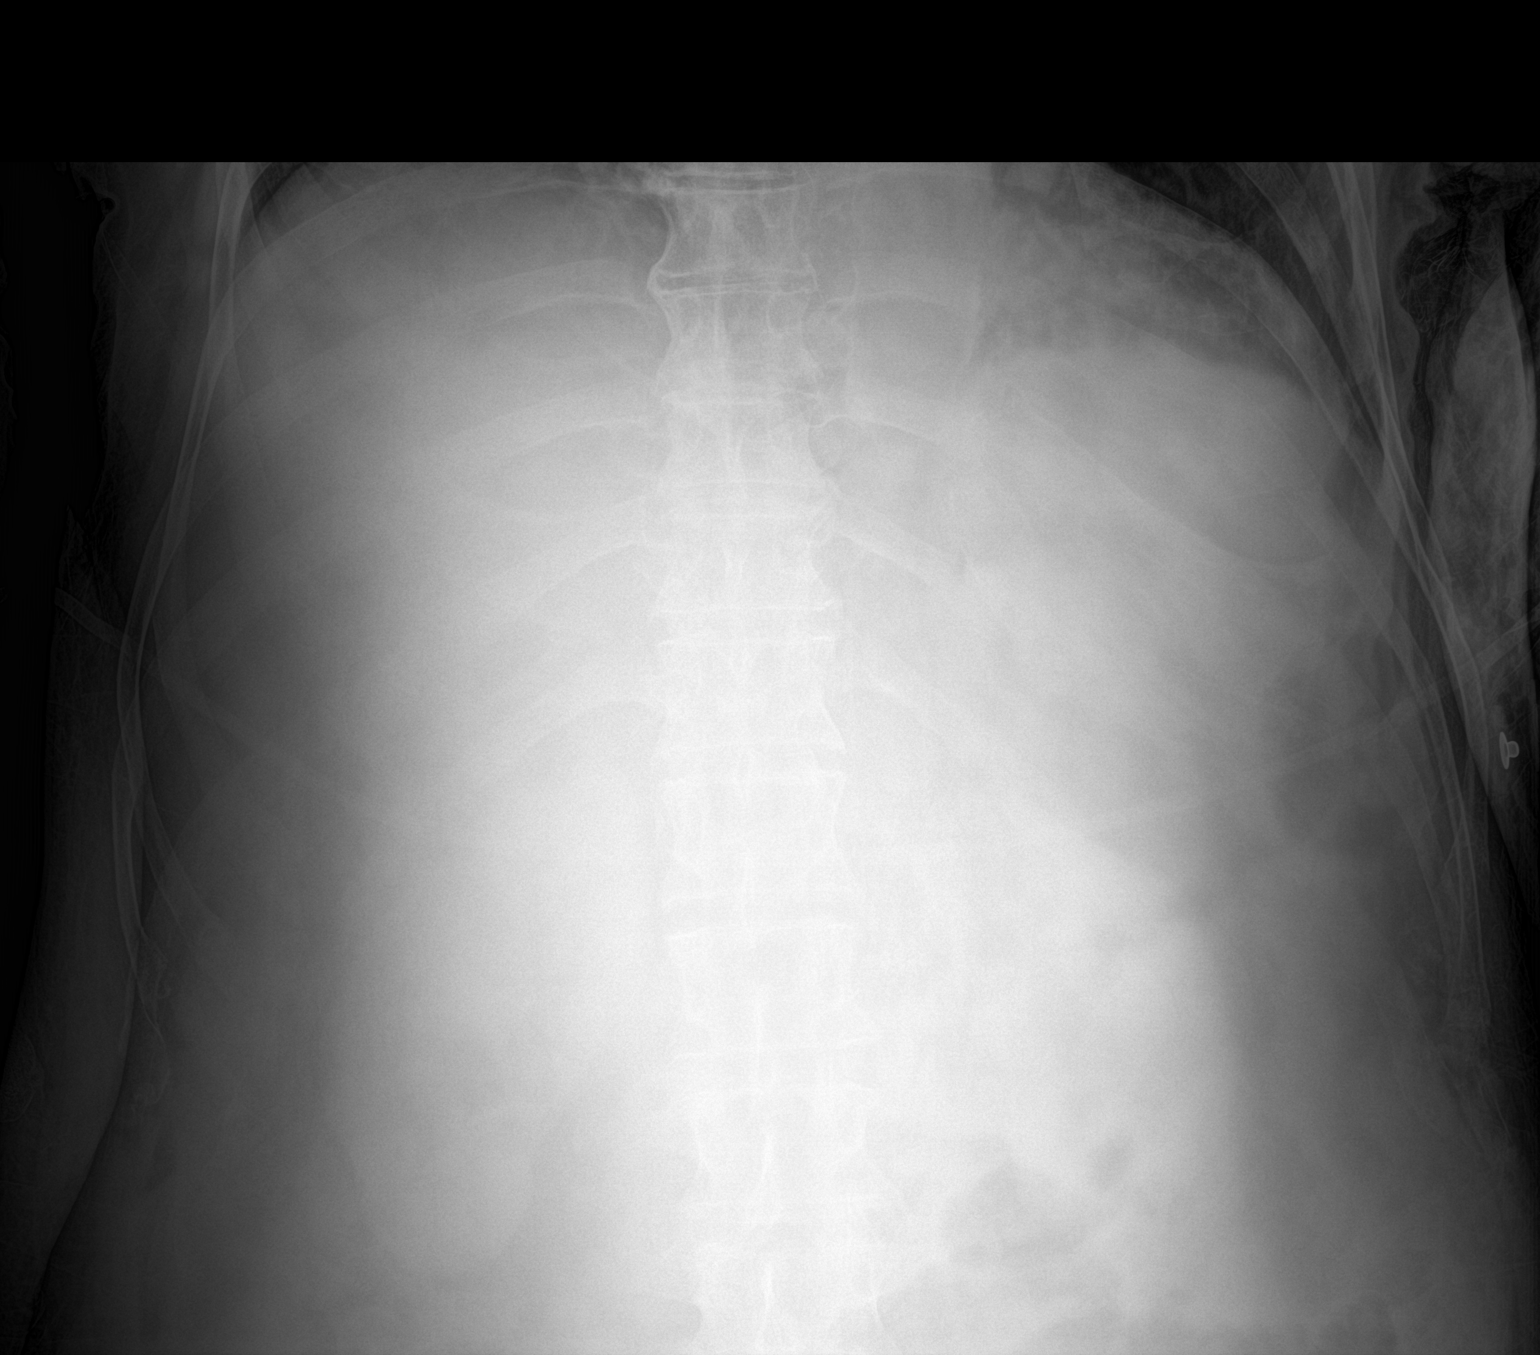

[abdomen kub (2 of 2)]
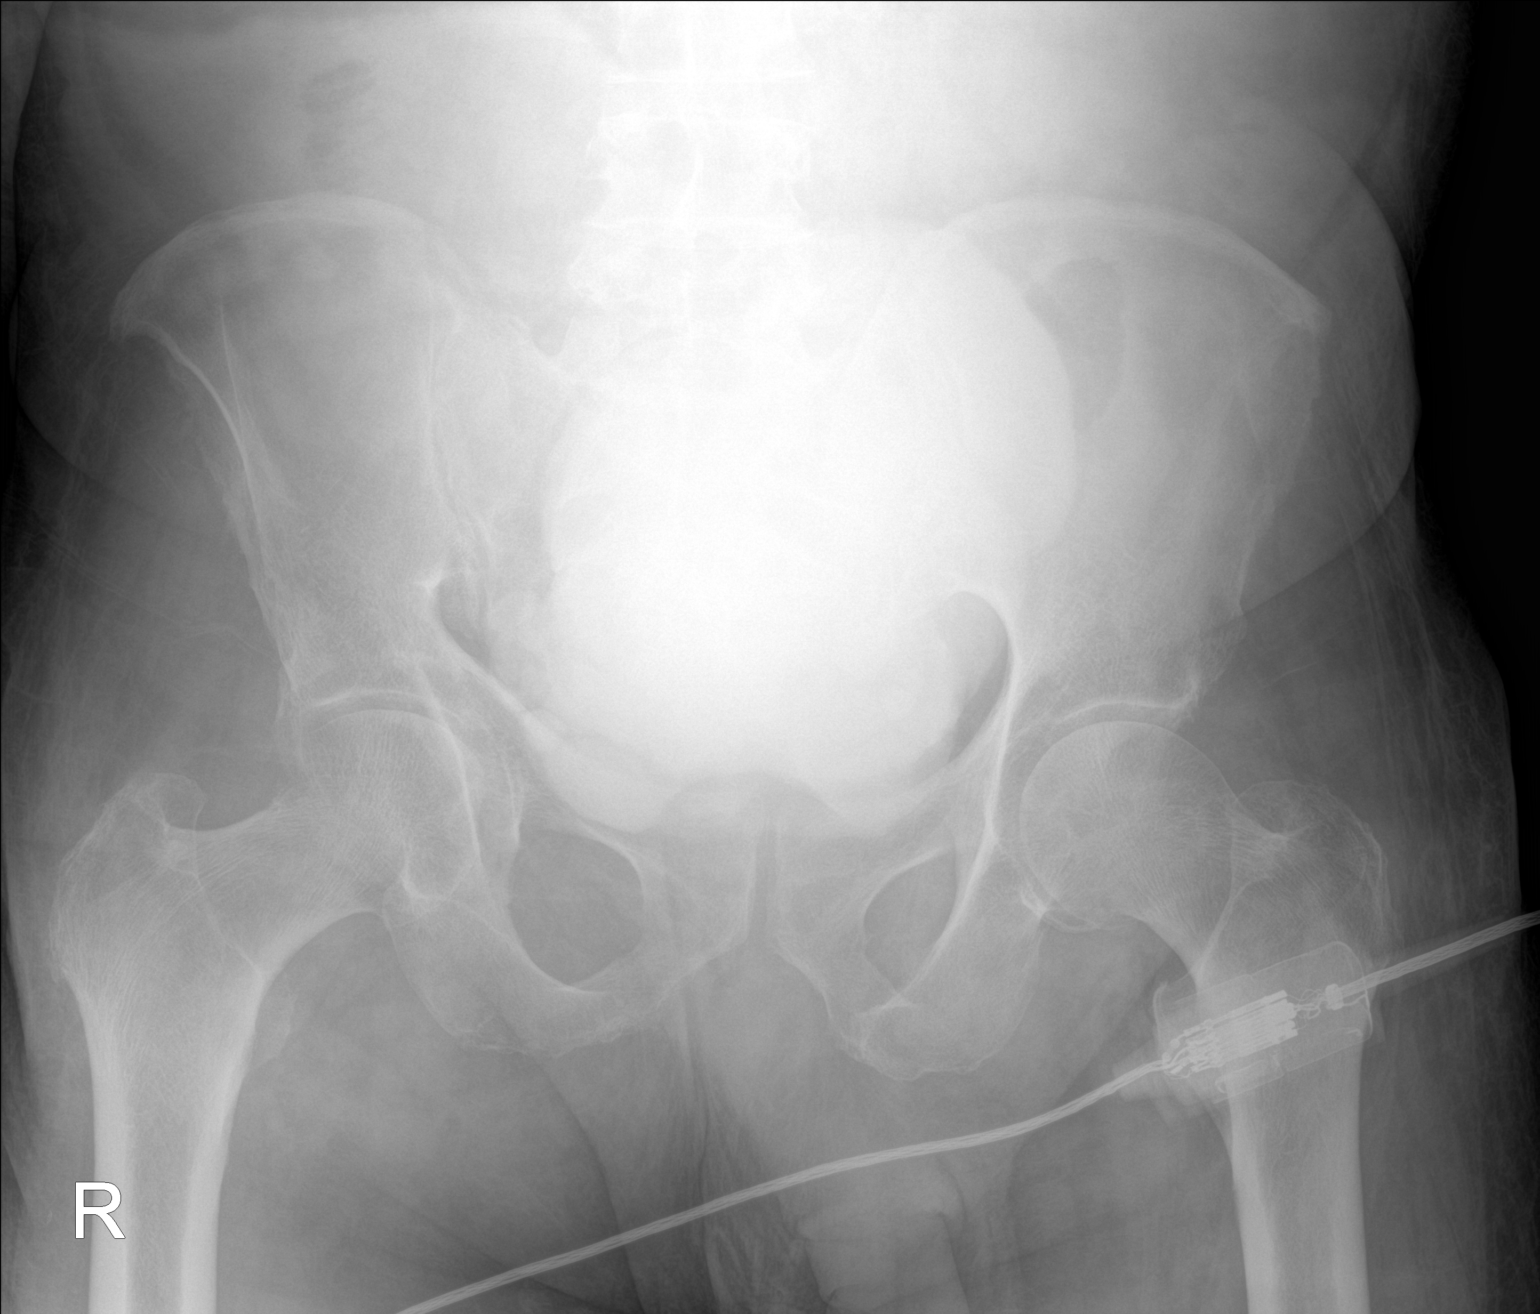

[2 of 2 positions shown; findings below may reference images not displayed]

FINDINGS: 2 supine frontal views of the abdomen and pelvis demonstrates
excreted contrast within a distended bladder. Multiple bladder
diverticula are compatible with chronic bladder outlet obstruction.
No evidence of bowel obstruction or ileus. No acute bony
abnormalities.
IMPRESSION: 1. Unremarkable bowel gas pattern.
2. Contrast filled bladder, with numerous diverticula, consistent
with chronic bladder outlet obstruction.

## 2022-03-17 IMAGING — DX DG CHEST 1V PORT
1 series · 1 of 1 positions shown · non-contrast
Comparison: 06/05/2020

CLINICAL DATA: Elevated troponin, abdominal pain

EXAM:
PORTABLE CHEST 1 VIEW

[chest ap]
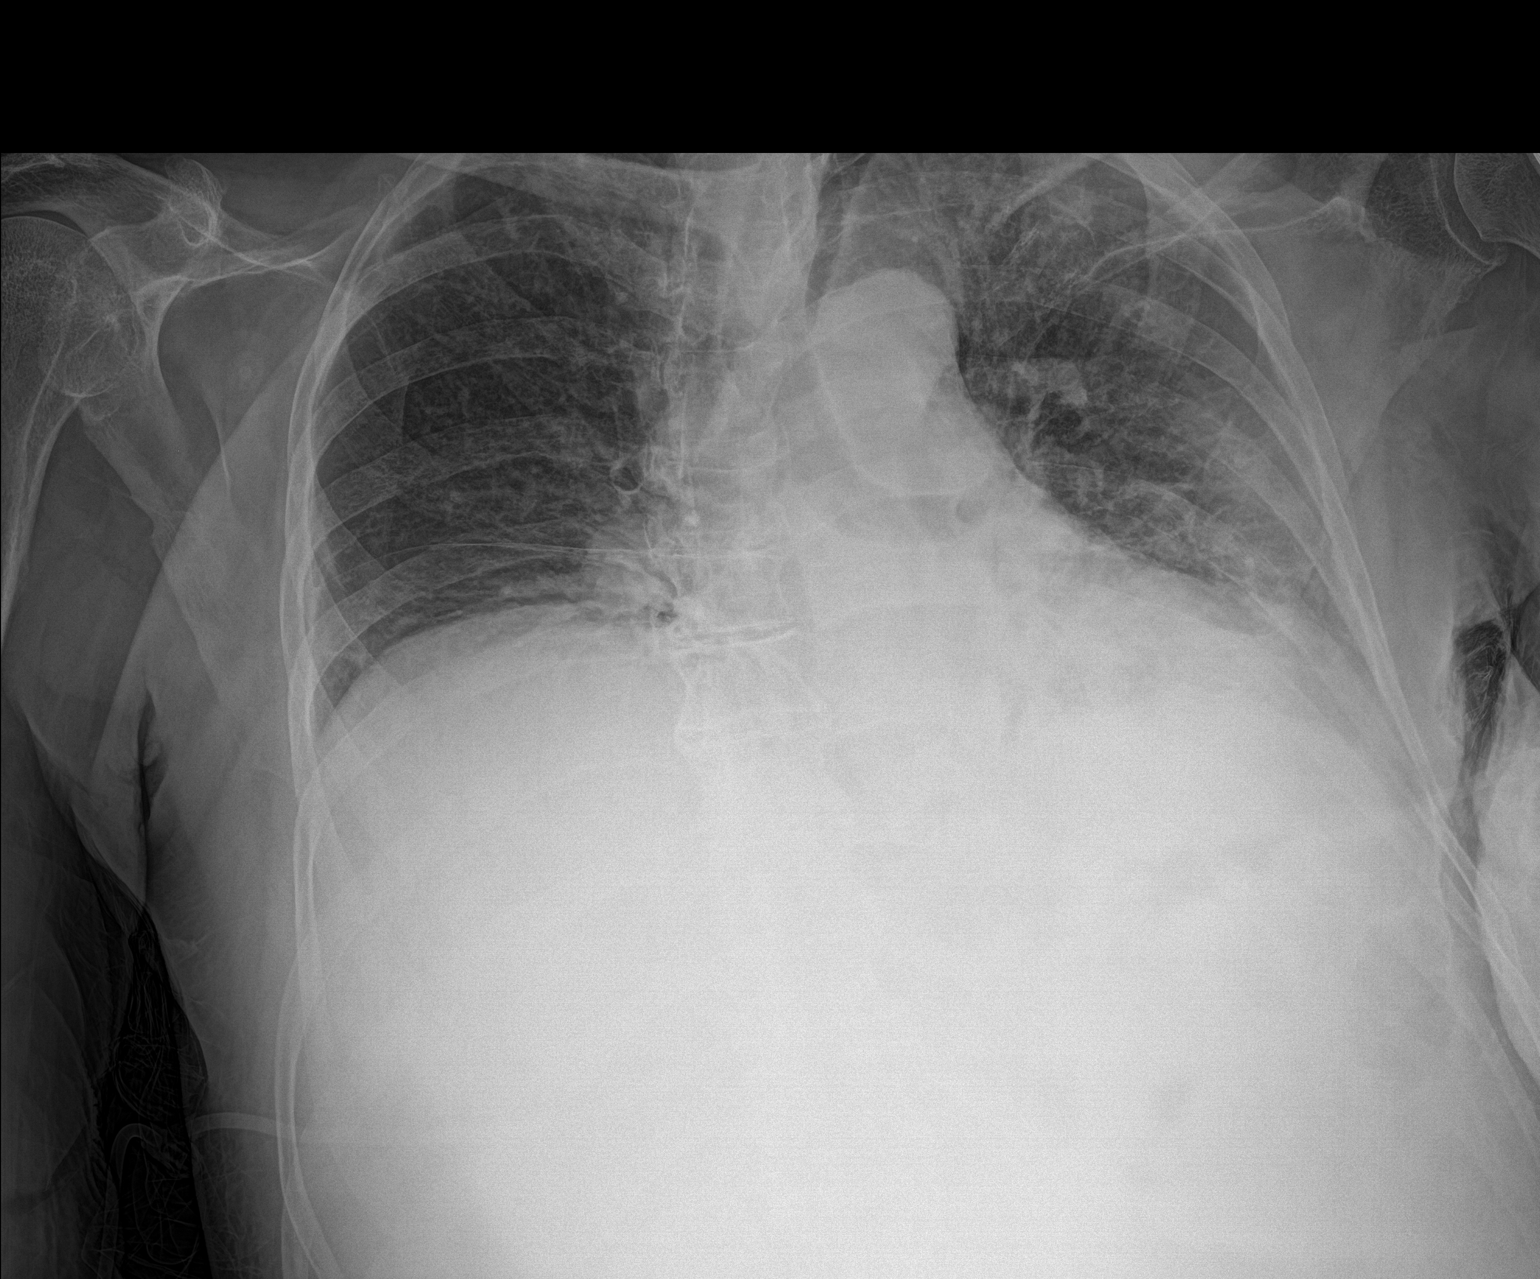

[1 of 1 positions shown; findings below may reference images not displayed]

FINDINGS: Single frontal view of the chest demonstrates a stable cardiac
silhouette. There is chronic elevation of the right hemidiaphragm.
Lung volumes remain diminished, with increased central vascular
congestion and interstitial prominence since prior study. There is
patchy consolidation at the left lung base, with trace left pleural
effusion. No pneumothorax.
IMPRESSION: 1. Low lung volumes, with basilar areas of atelectasis.
2. Increased central vascular congestion.
3. Trace left pleural effusion.
